# Patient Record
Sex: Female | Born: 1970 | Race: White | Hispanic: No | Marital: Single | Smoking: Never smoker
Health system: Southern US, Community
[De-identification: ages and names within clinical notes are randomized; demographics above are authoritative.]

## PROBLEM LIST (undated history)

## (undated) DIAGNOSIS — D649 Anemia, unspecified: Secondary | ICD-10-CM

## (undated) DIAGNOSIS — C50919 Malignant neoplasm of unspecified site of unspecified female breast: Secondary | ICD-10-CM

## (undated) DIAGNOSIS — R51 Headache: Secondary | ICD-10-CM

## (undated) DIAGNOSIS — B009 Herpesviral infection, unspecified: Secondary | ICD-10-CM

## (undated) DIAGNOSIS — F419 Anxiety disorder, unspecified: Secondary | ICD-10-CM

## (undated) DIAGNOSIS — T7840XA Allergy, unspecified, initial encounter: Secondary | ICD-10-CM

## (undated) DIAGNOSIS — I499 Cardiac arrhythmia, unspecified: Secondary | ICD-10-CM

## (undated) DIAGNOSIS — F909 Attention-deficit hyperactivity disorder, unspecified type: Secondary | ICD-10-CM

## (undated) DIAGNOSIS — I10 Essential (primary) hypertension: Secondary | ICD-10-CM

## (undated) DIAGNOSIS — R519 Headache, unspecified: Secondary | ICD-10-CM

## (undated) DIAGNOSIS — Z923 Personal history of irradiation: Secondary | ICD-10-CM

## (undated) DIAGNOSIS — E039 Hypothyroidism, unspecified: Secondary | ICD-10-CM

## (undated) HISTORY — DX: Allergy, unspecified, initial encounter: T78.40XA

## (undated) HISTORY — PX: KNEE ARTHROSCOPY: SHX127

## (undated) HISTORY — DX: Malignant neoplasm of unspecified site of unspecified female breast: C50.919

## (undated) HISTORY — DX: Hypothyroidism, unspecified: E03.9

## (undated) HISTORY — DX: Anemia, unspecified: D64.9

## (undated) HISTORY — DX: Headache, unspecified: R51.9

## (undated) HISTORY — DX: Headache: R51

## (undated) HISTORY — DX: Attention-deficit hyperactivity disorder, unspecified type: F90.9

---

## 2010-09-07 HISTORY — PX: TONSILLECTOMY: SUR1361

## 2011-09-08 HISTORY — PX: OTHER SURGICAL HISTORY: SHX169

## 2011-09-08 HISTORY — PX: APPENDECTOMY: SHX54

## 2013-07-10 ENCOUNTER — Emergency Department: Payer: Self-pay | Admitting: Emergency Medicine

## 2013-09-12 ENCOUNTER — Emergency Department: Payer: Self-pay | Admitting: Emergency Medicine

## 2013-09-12 LAB — RAPID INFLUENZA A&B ANTIGENS (ARMC ONLY)

## 2013-09-21 ENCOUNTER — Ambulatory Visit (INDEPENDENT_AMBULATORY_CARE_PROVIDER_SITE_OTHER): Payer: BC Managed Care – PPO | Admitting: Internal Medicine

## 2013-09-21 ENCOUNTER — Encounter: Payer: Self-pay | Admitting: Internal Medicine

## 2013-09-21 VITALS — BP 116/78 | HR 87 | Temp 98.2°F | Ht 64.75 in | Wt 184.8 lb

## 2013-09-21 DIAGNOSIS — G8929 Other chronic pain: Secondary | ICD-10-CM | POA: Insufficient documentation

## 2013-09-21 DIAGNOSIS — Z566 Other physical and mental strain related to work: Secondary | ICD-10-CM

## 2013-09-21 DIAGNOSIS — R51 Headache: Secondary | ICD-10-CM

## 2013-09-21 DIAGNOSIS — E039 Hypothyroidism, unspecified: Secondary | ICD-10-CM | POA: Insufficient documentation

## 2013-09-21 DIAGNOSIS — Z569 Unspecified problems related to employment: Secondary | ICD-10-CM

## 2013-09-21 DIAGNOSIS — R519 Headache, unspecified: Secondary | ICD-10-CM

## 2013-09-21 DIAGNOSIS — Z1239 Encounter for other screening for malignant neoplasm of breast: Secondary | ICD-10-CM

## 2013-09-21 DIAGNOSIS — E669 Obesity, unspecified: Secondary | ICD-10-CM

## 2013-09-21 MED ORDER — ALPRAZOLAM 0.25 MG PO TABS: 0.2500 mg | ORAL_TABLET | Freq: Two times a day (BID) | ORAL | Status: DC | PRN

## 2013-09-21 MED ORDER — NORTRIPTYLINE HCL 10 MG PO CAPS: 10.0000 mg | ORAL_CAPSULE | Freq: Every day | ORAL | Status: DC

## 2013-09-21 MED ORDER — VERAPAMIL HCL 120 MG PO TABS: 120.0000 mg | ORAL_TABLET | Freq: Every day | ORAL | Status: DC

## 2013-09-21 NOTE — Assessment & Plan Note (Signed)
She seems to be overmedicated to me Will refer to neurology for further evaluation and treatment

## 2013-09-21 NOTE — Assessment & Plan Note (Signed)
Will give prn xanax

## 2013-09-21 NOTE — Assessment & Plan Note (Signed)
Pt reports this is stress related She plans to go back up to [place] to have her lap band adjusted I advised her to work on diet and exercise

## 2013-09-21 NOTE — Patient Instructions (Signed)

## 2013-09-21 NOTE — Assessment & Plan Note (Signed)
Will heck TSH level today Will adjust dose of synthroid based on level

## 2013-09-21 NOTE — Progress Notes (Signed)
HPI  Pt presents to the clinic today to establish care. She is transferring care from her PCP in [place]. She recently moved here. She is trying to adjust here. She has had worsening anxiety since she moved, occasional panic attacks. She would like something for this. She does have some concerns today about her headaches. They have gotten worse since she moved here. She has had 2 ER visits for the same. She was seeing a neurologist in [place] for nerve blocks which did help short term. She will need referral to a new neurologist. She also reports that she is due for her mammogram and needs to have a new order placed so she can get it done at [facility].  Past Medical History  Diagnosis Date  . Frequent headaches   . Allergy   . Hypothyroidism     Current Outpatient Prescriptions  Medication Sig Dispense Refill  . levonorgestrel-ethinyl estradiol (SEASONALE,INTROVALE,JOLESSA) 0.15-0.03 MG tablet Take 1 tablet by mouth daily.      [name] levothyroxine (SYNTHROID, LEVOTHROID) 137 MCG tablet Take 137 mcg by mouth daily before breakfast.      . metoprolol (LOPRESSOR) 50 MG tablet Take 50 mg by mouth at bedtime.      . nortriptyline (PAMELOR) 10 MG capsule Take 10 mg by mouth at bedtime.      . Prenatal Vit-Fe Fumarate-FA (PRENATAL MULTIVITAMIN) TABS tablet Take 1 tablet by mouth daily at 12 noon.      . topiramate (TOPAMAX) 50 MG tablet Take 50 mg by mouth 2 (two) times daily.      . verapamil (CALAN) 120 MG tablet Take 120 mg by mouth at bedtime.       No current facility-administered medications for this visit.    Not on File  Family History  Problem Relation Age of Onset  . Cancer Mother   . Depression Mother   . Cancer Father   . Hyperlipidemia Father     History   Social History  . Marital Status: Single    Spouse Name: N/A    Number of Children: N/A  . Years of Education: N/A   Occupational History  . Not on file.   Social History Main Topics  . Smoking status: Never Smoker   .  Smokeless tobacco: Not on file  . Alcohol Use: Yes     Comment: occasional  . Drug Use: No  . Sexual Activity: Not on file   Other Topics Concern  . Not on file   Social History Narrative  . No narrative on file    ROS:  Constitutional: Denies fever, malaise, fatigue,  or abrupt weight changes.  HEENT: Denies eye pain, eye redness, ear pain, ringing in the ears, wax buildup, runny nose, nasal congestion, bloody nose, or sore throat. Neurological: Denies dizziness, difficulty with memory, difficulty with speech or problems with balance and coordination.  Psych: Pt reports anxiety. Denies depression, SI/HI.  No other specific complaints in a complete review of systems (except as listed in HPI above).  PE:  BP 116/78  Pulse 87  Temp(Src) 98.2 F (36.8 C) (Oral)  Ht 5' 4.75" (1.645 m)  Wt 184 lb 12 oz (83.802 kg)  BMI 30.97 kg/m2  SpO2 98% Wt Readings from Last 3 Encounters:  09/21/13 184 lb 12 oz (83.802 kg)    General: Appears her stated age, overweight well developed, well nourished in NAD. Cardiovascular: Normal rate and rhythm. S1,S2 noted.  No murmur, rubs or gallops noted. No JVD or BLE edema.  No carotid bruits noted. Pulmonary/Chest: Normal effort and positive vesicular breath sounds. No respiratory distress. No wheezes, rales or ronchi noted.  Neurological: Alert and oriented. Cranial nerves II-XII intact. Coordination normal. +DTRs bilaterally. Psychiatric: Mood anxious and affect normal. Behavior is normal. Judgment and thought content normal.     Assessment and Plan:  Screening for breast cancer:  Referral placed today Will have you set it up with Rosaria Ferries on your way out  Please make an appt for your physical at your earliest convenience

## 2013-09-22 ENCOUNTER — Telehealth: Payer: Self-pay

## 2013-09-22 ENCOUNTER — Other Ambulatory Visit: Payer: Self-pay | Admitting: Internal Medicine

## 2013-09-22 DIAGNOSIS — Z566 Other physical and mental strain related to work: Secondary | ICD-10-CM

## 2013-09-22 LAB — TSH: TSH: 0.94 u[IU]/mL (ref 0.35–5.50)

## 2013-09-22 MED ORDER — LEVOTHYROXINE SODIUM 137 MCG PO TABS: 137.0000 ug | ORAL_TABLET | Freq: Every day | ORAL | Status: DC

## 2013-09-22 MED ORDER — ALPRAZOLAM 0.25 MG PO TABS: 0.2500 mg | ORAL_TABLET | Freq: Two times a day (BID) | ORAL | Status: DC | PRN

## 2013-09-22 NOTE — Telephone Encounter (Signed)
Pt left v/m alprazolam was not at [facility] when picked up other meds. Spoke with [facility] and xanax rx was not received; Medication phoned to [facility] as instructed. Pt notified refill done and apologized to pt.pt voiced understanding.

## 2013-10-18 ENCOUNTER — Ambulatory Visit: Payer: BC Managed Care – PPO | Admitting: Neurology

## 2013-10-24 ENCOUNTER — Ambulatory Visit: Payer: BC Managed Care – PPO | Admitting: Neurology

## 2013-10-25 ENCOUNTER — Telehealth: Payer: Self-pay | Admitting: Neurology

## 2013-10-25 NOTE — Telephone Encounter (Signed)
LM for pt to call. Re: appt 10/24/13 was cancelled d/t inclement weather. I have asked pt to call and r/s appt. Currently awaiting return call / Sherri S.

## 2013-11-07 ENCOUNTER — Other Ambulatory Visit: Payer: Self-pay | Admitting: Internal Medicine

## 2013-11-07 NOTE — Telephone Encounter (Signed)
Last filled 09/22/13--please advise

## 2013-11-07 NOTE — Telephone Encounter (Signed)
Ok to phone in xanax 

## 2013-11-08 ENCOUNTER — Ambulatory Visit (INDEPENDENT_AMBULATORY_CARE_PROVIDER_SITE_OTHER): Payer: BC Managed Care – PPO | Admitting: Neurology

## 2013-11-08 ENCOUNTER — Encounter: Payer: Self-pay | Admitting: Neurology

## 2013-11-08 VITALS — BP 112/78 | HR 70 | Temp 98.2°F | Resp 16 | Ht 65.0 in | Wt 186.6 lb

## 2013-11-08 DIAGNOSIS — G43709 Chronic migraine without aura, not intractable, without status migrainosus: Secondary | ICD-10-CM

## 2013-11-08 DIAGNOSIS — G4440 Drug-induced headache, not elsewhere classified, not intractable: Secondary | ICD-10-CM

## 2013-11-08 MED ORDER — NORTRIPTYLINE HCL 25 MG PO CAPS: 25.0000 mg | ORAL_CAPSULE | Freq: Every day | ORAL | Status: DC

## 2013-11-08 MED ORDER — SUMATRIPTAN SUCCINATE 100 MG PO TABS
100.0000 mg | ORAL_TABLET | Freq: Once | ORAL | Status: DC | PRN
Start: 2013-11-08 — End: 2015-08-05

## 2013-11-08 NOTE — Telephone Encounter (Signed)
Rx called in to pharmacy. 

## 2013-11-08 NOTE — Telephone Encounter (Addendum)
Pt left v/m checking on status of alprazolam refill and wanted to know when someone was going to call pt about scheduling CPX; spoke with [facility] and rx ready for pick up. Left v/m notifying pt rx ready for pick up and pt could call our office to schedule CPX.

## 2013-11-08 NOTE — Progress Notes (Signed)
NEUROLOGY CONSULTATION NOTE  Alexxis Mackert MRN: [id number] DOB: Jun 21, 1971  Referring provider: Webb Silversmith, FNP Primary care provider: Webb Silversmith, FNP  Reason for consult:  headache  HISTORY OF PRESENT ILLNESS: [name] is a 43 year old right-handed woman with history of hypothyroidism, obesity and anxiety who presents for chronic headache.  Records and images were personally reviewed where available.    Onset:  7-8 years ago, but worse over past 3 years and since recently moving here from [place] in September.  She has been experiencing increased anxiety and panic attacks. Location:  Frontal or posterior, bilateral or unilateral Quality:  Pressure, non-throbbing Intensity:   3-4/10 (mild attacks), 8-9/10 (severe attacks) Aura:  no Associated symptoms:  Osmophobia.  Recently, started having blurred vision and photophobia.  No nausea Duration:  2 hours but sometimes up to 1.5 days Frequency:  Mild attacks occur 3x/week, severe attacks occur 4x/month.  (20 headache days a month for 3 years) Triggers/exacerbating factors:  stress, anxiety, change in barometric pressure Relieving factors:   Improved for a little while several years ago after losing weight following lapband surgery  Past abortive therapy:  Excedrin (helped for mild attacks), NSAIDs (ineffective), Imitrex (effective), Relpax (effective) Past preventative therapy:  nerve injections (cannot elaborate on this but they helped for 1-2 weeks at a time) Past medications:  Amitriptyline (nightmares)  Current abortive therapy:  Fiorinal (takes 3x/week), Tylenol (2-3x/week) Current preventative therapy:  nortriptyline 10mg  at bedtime, topiramate 50mg  twice daily (side effects at higher doses), verapamil 120mg  at bedtime, metoprolol 50mg  at bedtime.  Caffeine:  One cup coffee  Stress/depression:  History of anxiety.  Increased stress and anxiety with panic attacks since moving here from [place]. Sleep  hygiene:  varies Family history of headache:  no.  PAST MEDICAL HISTORY: Past Medical History  Diagnosis Date  . Frequent headaches   . Allergy   . Hypothyroidism     PAST SURGICAL HISTORY: Past Surgical History  Procedure Laterality Date  . Appendectomy  2013  . Tonsillectomy  2012  . Lapband  2013    MEDICATIONS: Current Outpatient Prescriptions on File Prior to Visit  Medication Sig Dispense Refill  . ALPRAZolam (XANAX) 0.25 MG tablet TAKE 1 TABLET BY MOUTH TWICE A DAY AS NEEDED ANXIETY  20 tablet  0  . levonorgestrel-ethinyl estradiol (SEASONALE,INTROVALE,JOLESSA) 0.15-0.03 MG tablet Take 1 tablet by mouth daily.      [name] levothyroxine (SYNTHROID, LEVOTHROID) 137 MCG tablet Take 1 tablet (137 mcg total) by mouth daily before breakfast.  30 tablet  2  . metoprolol (LOPRESSOR) 50 MG tablet Take 50 mg by mouth at bedtime.      . Prenatal Vit-Fe Fumarate-FA (PRENATAL MULTIVITAMIN) TABS tablet Take 1 tablet by mouth daily at 12 noon.      . topiramate (TOPAMAX) 50 MG tablet Take 50 mg by mouth 2 (two) times daily.      . verapamil (CALAN) 120 MG tablet Take 1 tablet (120 mg total) by mouth at bedtime.  30 tablet  2   No current facility-administered medications on file prior to visit.    ALLERGIES: Allergies  Allergen Reactions  . Ciprofloxacin   . Keflex [Cephalexin] Hives    FAMILY HISTORY: Family History  Problem Relation Age of Onset  . Cancer Mother   . Depression Mother   . Cancer Father   . Hyperlipidemia Father     SOCIAL HISTORY: History   Social History  . Marital Status: Single    Spouse Name: N/A  Number of Children: N/A  . Years of Education: N/A   Occupational History  . Not on file.   Social History Main Topics  . Smoking status: Never Smoker   . Smokeless tobacco: Not on file  . Alcohol Use: Yes     Comment: occasional  . Drug Use: No  . Sexual Activity: Yes    Partners: Male   Other Topics Concern  . Not on file   Social History  Narrative  . No narrative on file    REVIEW OF SYSTEMS: Constitutional: No fevers, chills, or sweats, no generalized fatigue, change in appetite Eyes: No visual changes, double vision, eye pain Ear, nose and throat: No hearing loss, ear pain, nasal congestion, sore throat Cardiovascular: No chest pain, palpitations Respiratory:  No shortness of breath at rest or with exertion, wheezes GastrointestinaI: No nausea, vomiting, diarrhea, abdominal pain, fecal incontinence Genitourinary:  No dysuria, urinary retention or frequency Musculoskeletal:  No neck pain, back pain Integumentary: No rash, pruritus, skin lesions Neurological: as above Psychiatric: No depression, insomnia, anxiety Endocrine: No palpitations, fatigue, diaphoresis, mood swings, change in appetite, change in weight, increased thirst Hematologic/Lymphatic:  No anemia, purpura, petechiae. Allergic/Immunologic: no itchy/runny eyes, nasal congestion, recent allergic reactions, rashes  PHYSICAL EXAM: Filed Vitals:   11/08/13 1424  BP: 112/78  Pulse: 70  Temp: 98.2 F (36.8 C)  Resp: 16   General: No acute distress Head:  Normocephalic/atraumatic Neck: supple, no paraspinal tenderness, full range of motion Back: No paraspinal tenderness Heart: regular rate and rhythm Lungs: Clear to auscultation bilaterally. Vascular: No carotid bruits. Neurological Exam: Mental status: alert and oriented to person, place, and time, speech fluent and not dysarthric, language intact. Cranial nerves: CN I: not tested CN II: pupils equal, round and reactive to light, visual fields intact, fundi unremarkable. CN III, IV, VI:  full range of motion, no nystagmus, no ptosis CN V: facial sensation intact CN VII: upper and lower face symmetric CN VIII: hearing intact CN IX, X: gag intact, uvula midline CN XI: sternocleidomastoid and trapezius muscles intact CN XII: tongue midline Bulk & Tone: normal, no fasciculations. Motor: 5/5  throughout Sensation: temperature and vibration intact Deep Tendon Reflexes: 2+ throughout, toes down Finger to nose testing: no dysmetria Heel to shin: no dysmetria Gait: normal stance and stride.  Able to turn and walk in tandem. Romberg negative.  IMPRESSION: Chronic migraine without aura, complicated by medication-overuse  PLAN: 1.  Increase nortriptyline to 25mg  at bedtime 2.  Sumatriptan 100mg  for abortive therapy. 3.  Limit pain relievers to no more than 2 days out of the week. 4.  Stop verapamil and Fiorinal  5.  Continue metoprolol and topamax for now. 6.  Pre-authorization for botox.  She is a viable candidate.  She has migraines and suffers from over 15 days of headache per month for 3 years.  She has failed several preventative medications and currently is taking 4 preventative medications. 7.  Obtain notes from [place]. 8.  Follow up for Botox  Thank you for allowing me to take part in the care of this patient.  Metta Clines, DO  CC:  Webb Silversmith, FNP

## 2013-11-08 NOTE — Patient Instructions (Signed)
Migraine Recommendations: 1.  Take sumatriptan (Imitrex) at immediate onset of migraine.  May repeat once in 2 hours if headache persists or recurs.  Do not exceed two tablets in a 24 hour period. 2.  Limit use of pain relievers to no more than 2 days out of the week.  These medications include acetaminophen, ibuprofen, triptans and narcotics.  This will help reduce risk of rebound headaches. 3.  Keep a headache diary. 4.  Stay adequately hydrated. 5.  Maintain good sleep hygiene. 6.  Maintain proper stress management. 7.  Stop the Fiorinal and Verapamil. 8.  Increase nortriptyline to 25mg  at bedtime.  In the meantime, take 2 of your 10mg  pills until you are ready for a refill.  Order is placed at your pharmacy. 9.  Continue the topamax and metoprolol for now. 10.  Will get pre-authorization for Botox. 11.  Follow up one month after first round of botox.  Call in 4 weeks with update or sooner if needed.

## 2013-11-17 ENCOUNTER — Telehealth: Payer: Self-pay | Admitting: Neurology

## 2013-11-17 NOTE — Telephone Encounter (Signed)
Still having headache with the imitrex please call at [phone]

## 2013-11-20 NOTE — Telephone Encounter (Signed)
Please advise patient is still having headache on the imitrex

## 2013-11-21 NOTE — Telephone Encounter (Signed)
RX for Relpax 20 mg  # 10 and 1 refill called in as well as prednisone taper with direction as  indicated per Dr Tomi Likens

## 2013-11-21 NOTE — Telephone Encounter (Signed)
She can try the Relpax again.  20mg  for headache.  May repeat x1 after 2 hours.  #10, R1.    However, as we discussed at her visit, she should expect that the headaches may get worse since I don't want her taking Fiorinal and I want her to limit pain relievers to no more than 2 days out of the week.  She has chronic daily headaches, so most acute pain relievers may not be effective at this point, anyway.  The real focus is to break and reduce frequency of the headaches, which is why we are trying to get approval for Botox.    In the meantime, we can prescribe her a prednisone 10mg  tablet taper (6tabs x1d, then 5tabs x1d, then 4tabs x1d, then 3tabs x1d, then 2tabs x1d, then 1tab x1d, then stop) #21, R0.  She shouldn't take any NSAIDs while on this taper because of GI upset.

## 2013-11-21 NOTE — Telephone Encounter (Signed)
Pt has called a 2nd time, still having headaches. Please call ASAP / Gayleen Orem,

## 2013-11-22 ENCOUNTER — Telehealth: Payer: Self-pay | Admitting: Neurology

## 2013-11-22 NOTE — Telephone Encounter (Signed)
Question about Botox prior approval. Please call Kiel w/ [employer] [phone] / Gayleen Orem.

## 2013-11-22 NOTE — Telephone Encounter (Signed)
Correct # [phone] [id number]

## 2013-11-27 ENCOUNTER — Telehealth: Payer: Self-pay | Admitting: Neurology

## 2013-11-27 ENCOUNTER — Encounter: Payer: Self-pay | Admitting: Family Medicine

## 2013-11-27 ENCOUNTER — Ambulatory Visit (INDEPENDENT_AMBULATORY_CARE_PROVIDER_SITE_OTHER): Payer: BC Managed Care – PPO | Admitting: Family Medicine

## 2013-11-27 VITALS — BP 142/82 | HR 81 | Temp 97.9°F | Wt 190.5 lb

## 2013-11-27 DIAGNOSIS — R21 Rash and other nonspecific skin eruption: Secondary | ICD-10-CM | POA: Insufficient documentation

## 2013-11-27 MED ORDER — MUPIROCIN CALCIUM 2 % EX CREA: 1.0000 "application " | TOPICAL_CREAM | Freq: Three times a day (TID) | CUTANEOUS | Status: DC

## 2013-11-27 MED ORDER — VALACYCLOVIR HCL 1 G PO TABS: 1000.0000 mg | ORAL_TABLET | Freq: Three times a day (TID) | ORAL | Status: DC

## 2013-11-27 NOTE — Telephone Encounter (Signed)
I called [name] and told her that her insurance carrier denied Botox.  The reason being that she has medication overuse headache as well (Fioricet 3x/week and Tylenol 2-3x/week).  Unfortunately the prednisone taper was minimally effective.  The only way to treat it at this time would be to refrain from using pain relievers to no more than 2 days out of the week.  This is the only way to treat medication-overuse headache.  I told her that the headaches will likely get worse before they get better.  If the headaches persist after 3 months of minimal pain-reliever use, then we can try re-submitting pre-authorization for Botox.  I answered all questions to the best of my ability.

## 2013-11-27 NOTE — Progress Notes (Signed)
Pre visit review using our clinic review tool, if applicable. No additional management support is needed unless otherwise documented below in the visit note. 

## 2013-11-27 NOTE — Progress Notes (Signed)
   BP 142/82  Pulse 81  Temp(Src) 97.9 F (36.6 C) (Oral)  Wt 190 lb 8 oz (86.41 kg)  SpO2 96%  LMP 08/15/2013   CC: check spot on nose  Subjective:    Patient ID: [name], female    DOB: [birth year], 43 y.o.   MRN: [id number]  HPI: [name] is a 43 y.o. female presenting on 11/27/2013 for Rash   Woke up this morning with red lesion on nose - between nares.  No crusting or discharge.  Increased stress over last few weeks.  + itchy and uncomfortable throbbing pain no burning. Some malaise as well. Has tried lavender oil which helped some.  H/o cold sores but never on the nose.  No fevers/chills, other rash, oral lesions.  Relevant past medical, surgical, family and social history reviewed and updated as indicated.  Allergies and medications reviewed and updated. Current Outpatient Prescriptions on File Prior to Visit  Medication Sig  . ALPRAZolam (XANAX) 0.25 MG tablet TAKE 1 TABLET BY MOUTH TWICE A DAY AS NEEDED ANXIETY  . levonorgestrel-ethinyl estradiol (SEASONALE,INTROVALE,JOLESSA) 0.15-0.03 MG tablet Take 1 tablet by mouth daily.  [name] levothyroxine (SYNTHROID, LEVOTHROID) 137 MCG tablet Take 1 tablet (137 mcg total) by mouth daily before breakfast.  . metoprolol (LOPRESSOR) 50 MG tablet Take 50 mg by mouth at bedtime.  . nortriptyline (PAMELOR) 25 MG capsule Take 1 capsule (25 mg total) by mouth at bedtime.  . Prenatal Vit-Fe Fumarate-FA (PRENATAL MULTIVITAMIN) TABS tablet Take 1 tablet by mouth daily at 12 noon.  . SUMAtriptan (IMITREX) 100 MG tablet Take 1 tablet (100 mg total) by mouth once as needed for migraine or headache. May repeat once in 2 hours if headache persists or recurs.  Do not exceed two tabs in 24h  . topiramate (TOPAMAX) 50 MG tablet Take 50 mg by mouth 2 (two) times daily.   No current facility-administered medications on file prior to visit.    Review of Systems Per HPI unless specifically indicated above    Objective:    BP  142/82  Pulse 81  Temp(Src) 97.9 F (36.6 C) (Oral)  Wt 190 lb 8 oz (86.41 kg)  SpO2 96%  LMP 08/15/2013  Physical Exam  Nursing note and vitals reviewed. Constitutional: She appears well-developed and well-nourished. No distress.  HENT:  Head: Normocephalic and atraumatic.  Nose: No mucosal edema, rhinorrhea or nose lacerations.    5-6 erythematous papules with mild crusting overlying lesions, less likely vesicular       Assessment & Plan:   Problem List Items Addressed This Visit   Skin rash - Primary     On nose - anticipate impetigo - treat with mupirocin cream tid x5 days, discussed warm compresses as well. Less likely shingles - however if spreading unilateral rash, recommended fill valtrex - and provided with WASP. Pt agrees with plan. 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        Follow up plan: Return if symptoms worsen or fail to improve.

## 2013-11-27 NOTE — Patient Instructions (Signed)
I'd like to treat you as impetigo which is a bacterial skin infection. If spreading blisters on left side of face, start valtrex (antiviral for shingles). Let us know if not improving as expected or any worsening.  Impetigo Impetigo is an infection of the skin, most common in babies and children.  CAUSES  It is caused by staphylococcal or streptococcal germs (bacteria). Impetigo can start after any damage to the skin. The damage to the skin may be from things like:   Chickenpox.  Scrapes.  Scratches.  Insect bites (common when children scratch the bite).  Cuts.  Nail biting or chewing. Impetigo is contagious. It can be spread from one person to another. Avoid close skin contact, or sharing towels or clothing. SYMPTOMS  Impetigo usually starts out as small blisters or pustules. Then they turn into tiny yellow-crusted sores (lesions).  There may also be:  Large blisters.  Itching or pain.  Pus.  Swollen lymph glands. With scratching, irritation, or non-treatment, these small areas may get larger. Scratching can cause the germs to get under the fingernails; then scratching another part of the skin can cause the infection to be spread there. DIAGNOSIS  Diagnosis of impetigo is usually made by a physical exam. A skin culture (test to grow bacteria) may be done to prove the diagnosis or to help decide the best treatment.  TREATMENT  Mild impetigo can be treated with prescription antibiotic cream. Oral antibiotic medicine may be used in more severe cases. Medicines for itching may be used. HOME CARE INSTRUCTIONS   To avoid spreading impetigo to other body areas:  Keep fingernails short and clean.  Avoid scratching.  Cover infected areas if necessary to keep from scratching.  Gently wash the infected areas with antibiotic soap and water.  Soak crusted areas in warm soapy water using antibiotic soap.  Gently rub the areas to remove crusts. Do not scrub.  Wash hands often  to avoid spread this infection.  Keep children with impetigo home from school or daycare until they have used an antibiotic cream for 48 hours (2 days) or oral antibiotic medicine for 24 hours (1 day), and their skin shows significant improvement.  Children may attend school or daycare if they only have a few sores and if the sores can be covered by a bandage or clothing. SEEK MEDICAL CARE IF:   More blisters or sores show up despite treatment.  Other family members get sores.  Rash is not improving after 48 hours (2 days) of treatment. SEEK IMMEDIATE MEDICAL CARE IF:   You see spreading redness or swelling of the skin around the sores.  You see red streaks coming from the sores.  Your child develops a fever of 100.4 F (37.2 C) or higher.  Your child develops a sore throat.  Your child is acting ill (lethargic, sick to their stomach). Document Released: 08/21/2000 Document Revised: 11/16/2011 Document Reviewed: 06/20/2008 [facility] Patient Information 2014 [place].

## 2013-11-27 NOTE — Assessment & Plan Note (Addendum)
On nose - anticipate impetigo - treat with mupirocin cream tid x5 days, discussed warm compresses as well. Less likely shingles - however if spreading unilateral rash, recommended fill valtrex - and provided with WASP. Pt agrees with plan. 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

## 2013-12-01 ENCOUNTER — Telehealth: Payer: Self-pay | Admitting: Neurology

## 2013-12-01 NOTE — Telephone Encounter (Signed)
I spoke with patient she has had headache x 1 day starting this am  With vomiting Per Dr Tomi Likens she should take her Imatrix  And she should take no more than for 2 days a week pain meds

## 2013-12-01 NOTE — Telephone Encounter (Signed)
Pt is calling again and needs to talk to someone

## 2013-12-01 NOTE — Telephone Encounter (Signed)
Pt called stating that she is experiencing migraines and vomiting from it.  Please call pt.

## 2013-12-13 ENCOUNTER — Emergency Department: Payer: Self-pay | Admitting: Emergency Medicine

## 2013-12-18 ENCOUNTER — Telehealth: Payer: Self-pay

## 2013-12-18 NOTE — Telephone Encounter (Signed)
Patient Information:  Caller Name: Addalie  Phone: [phone]  Patient: [name]  Gender: Female  DOB: 04/09/1971  Age: 43 Years  PCP: Other  Pregnant: No  Office Follow Up:  Does the office need to follow up with this patient?: No  Instructions For The Office: N/A  RN Note:  Report called to [facility] before advising Urgent Care. Per Rena--Advise physicians want evaluation (more than just a urine specimen) prior to treating symptoms. Advise Urgent Care evaluation. Patient made aware.  Symptoms  Reason For Call & Symptoms: Urinary Frequency and Burning. PMH Frequent UTIs.  Reviewed Health History In EMR: Yes  Reviewed Medications In EMR: Yes  Reviewed Allergies In EMR: Yes  Reviewed Surgeries / Procedures: Yes  Date of Onset of Symptoms: 12/17/2013 OB / GYN:  LMP: 08/15/2013  Guideline(s) Used:  Urination Pain - Female  Disposition Per Guideline:   See Today in Office  Reason For Disposition Reached:   Painful urination AND EITHER frequency or urgency  Advice Given:  Fluids:   Drink extra fluids. Drink 8-10 glasses of liquids a day (Reason: to produce a dilute, non-irritating urine).  Call Back If:  You become worse.  Patient Will Follow Care Advice:  YES

## 2013-12-18 NOTE — Telephone Encounter (Signed)
Megan with [facility] said pt started last night with burning and frequency of urination, no fever. 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 Jinny Blossom said [facility] guidelines is to be seen within 24 hrs of onset symptoms; pt wanted to know if could bring urine specimen and drop off later today; advised Megan for best pt care pt would need to be seen;Megan will advise [facility].

## 2013-12-28 ENCOUNTER — Other Ambulatory Visit: Payer: Self-pay | Admitting: Internal Medicine

## 2013-12-29 NOTE — Telephone Encounter (Signed)
Rx called in to pharmacy. 

## 2013-12-29 NOTE — Telephone Encounter (Signed)
Last OV 09/2013--last refilled 11/2013--please advise

## 2014-01-11 ENCOUNTER — Telehealth: Payer: Self-pay | Admitting: Neurology

## 2014-01-11 NOTE — Telephone Encounter (Signed)
Patient called stating she has had a headache for the past week and half medication is giving no relief please advise ? She also questioned the Botox again .

## 2014-01-11 NOTE — Telephone Encounter (Signed)
Needs to talk to someone about the botox please call [phone]

## 2014-01-12 ENCOUNTER — Encounter: Payer: Self-pay | Admitting: Family Medicine

## 2014-01-12 ENCOUNTER — Ambulatory Visit (INDEPENDENT_AMBULATORY_CARE_PROVIDER_SITE_OTHER): Payer: BC Managed Care – PPO | Admitting: Family Medicine

## 2014-01-12 VITALS — BP 110/76 | HR 114 | Temp 98.2°F | Ht 65.0 in | Wt 186.2 lb

## 2014-01-12 DIAGNOSIS — N898 Other specified noninflammatory disorders of vagina: Secondary | ICD-10-CM

## 2014-01-12 DIAGNOSIS — N949 Unspecified condition associated with female genital organs and menstrual cycle: Secondary | ICD-10-CM

## 2014-01-12 NOTE — Telephone Encounter (Signed)
Prednisone taper was called in to [facility] pharmacy

## 2014-01-12 NOTE — Patient Instructions (Signed)
No bacterial or yeast or parasite infection. Wear cotton underware , avoid irritants, no douche, wash vaginally with warm water +/- mild soap only.

## 2014-01-12 NOTE — Assessment & Plan Note (Signed)
No bacterial or yeast or parasite infection. Wear cotton underware , avoid irritants, no douche, wash vaginally with warm water +/- mild soap only.  

## 2014-01-12 NOTE — Telephone Encounter (Signed)
Let's prescribe a prednisone taper (10mg  tablets).  Take 6tabs x1day, then 5tabs x1day, then 4tabs x1day, then 3tabs x1day, then 2tabs x1day, then 1tab x1day, then STOP.  She should refrain from taking any NSAIDs during the taper.  As far as the Botox, it has been denied.  They have a pretty strict criteria so I will look into it regarding what we can do.  In the meantime, let's schedule a follow up at earliest convenience to discuss further

## 2014-01-12 NOTE — Progress Notes (Signed)
Pre visit review using our clinic review tool, if applicable. No additional management support is needed unless otherwise documented below in the visit note. 

## 2014-01-12 NOTE — Telephone Encounter (Signed)
I spoke with patient regarding medication taper and not to take any  NSAIDS  While taking the NSAIDS she understands . She will also call and make an appt  ASAP for a late afternoon  She states  She had a fall last night  Because the headache was so bad

## 2014-01-12 NOTE — Progress Notes (Signed)
   Subjective:    Patient ID: [name], female    DOB: 07/14/1971, 43 y.o.   MRN: [id number]  Vaginal Discharge The patient's primary symptoms include a genital odor and a vaginal discharge. The patient's pertinent negatives include no genital itching, genital lesions, missed menses, pelvic pain or vaginal bleeding. Primary symptoms comment: greyish in color. This is a new problem. The current episode started in the past 7 days (2-3 days). The problem occurs constantly. The problem has been unchanged. The patient is experiencing no pain. She is not pregnant. Pertinent negatives include no abdominal pain, chills, dysuria or fever. The vaginal discharge was grey, malodorous and scant. She has tried nothing for the symptoms. She is sexually active. She uses oral contraceptives for contraception. Her past medical history is significant for vaginosis. (Last BV several years ago,  no recent antibiotics.)      Review of Systems  Constitutional: Negative for fever and chills.  Gastrointestinal: Negative for abdominal pain.  Genitourinary: Positive for vaginal discharge. Negative for dysuria, pelvic pain and missed menses.       Objective:   Physical Exam  Constitutional: She appears well-developed and well-nourished.  Cardiovascular: Normal rate.   No murmur heard. Pulmonary/Chest: Effort normal and breath sounds normal. She has no wheezes.  Abdominal: Soft. Bowel sounds are normal. There is no tenderness.  Genitourinary: There is no rash or tenderness on the right labia. There is no rash or tenderness on the left labia. No erythema or tenderness around the vagina. Vaginal discharge found.          Assessment & Plan:

## 2014-01-22 ENCOUNTER — Encounter: Payer: Self-pay | Admitting: Neurology

## 2014-01-22 ENCOUNTER — Ambulatory Visit (INDEPENDENT_AMBULATORY_CARE_PROVIDER_SITE_OTHER): Payer: BC Managed Care – PPO | Admitting: Neurology

## 2014-01-22 VITALS — BP 126/82 | HR 78 | Temp 98.0°F | Resp 20 | Ht 65.0 in | Wt 189.8 lb

## 2014-01-22 DIAGNOSIS — G43719 Chronic migraine without aura, intractable, without status migrainosus: Secondary | ICD-10-CM

## 2014-01-22 MED ORDER — KETOROLAC TROMETHAMINE 60 MG/2ML IM SOLN
60.0000 mg | Freq: Once | INTRAMUSCULAR | Status: AC
  Administered 2014-01-22: 60 mg via INTRAMUSCULAR

## 2014-01-22 NOTE — Progress Notes (Addendum)
NEUROLOGY FOLLOW UP OFFICE NOTE  [name] [id number]  HISTORY OF PRESENT ILLNESS: [name] is a 43 year old right-handed woman with history of hypothyroidism, obesity and anxiety who follows up for chronic migraine without aura and medication-overuse.  Records and images were personally reviewed where available.    UPDATE: Increased headache frequency over past month.  Prednisone taper last week not helpful.  Denied by [facility] for Botox due to medication-overuse.  Intensity:  3-4/10 mild, 8-9/10 severe Duration:  2 hours mild, 2 days severe Frequency:  20 headache days per month (6 severe migraine days, 14 mild migraine days) Associated symptoms:  Had two episodes with nausea and vomiting past month which is new.  Current abortive therapy:  Relpax 20mg  (effective but makes sleepy), sumatriptan 100mg  (effective 70% of time), occasionally tylenol 2 days of week (not really effective) Current preventative therapy:  nortriptyline 25mg , topamax 100mg , metoprolol 50mg   Caffeine:  none Alcohol:  rarely Smoker:  no Stress/depression:  Anxiety getting better Sleep hygiene:  better Diet:  Eats well (only has fast food about twice a month) Exercise:  3-4 times a week  HISTORY: Onset:  7-8 years ago, but worse over past 3 years and since recently moving here from [place] in September.  She has been experiencing increased anxiety and panic attacks. Location:  Frontal or posterior, bilateral or unilateral Quality:  Pressure, non-throbbing Intensity:   3-4/10 (mild attacks), 8-9/10 (severe attacks) Aura:  no Associated symptoms:  Osmophobia.  Recently, started having blurred vision and photophobia. Duration:  2 hours but sometimes up to 1.5 days Frequency:  Mild attacks occur 3x/week, severe attacks occur 4x/month.  (20 headache days a month for 3 years) Triggers/exacerbating factors:  stress, anxiety, change in barometric pressure Relieving factors:   Improved  for a little while several years ago after losing weight following lapband surgery  Past abortive therapy:  Excedrin (helped for mild attacks), ibuprofen (ineffective), naproxen (ineffective), Fiorinal Past preventative therapy:  nerve injections (cannot elaborate on this but they helped for 1-2 weeks at a time), amitriptyline (nightmares), verapamil (ineffective)  Family history of migraine:  No  PAST MEDICAL HISTORY: Past Medical History  Diagnosis Date  . Frequent headaches   . Allergy   . Hypothyroidism     MEDICATIONS: Current Outpatient Prescriptions on File Prior to Visit  Medication Sig Dispense Refill  . ALPRAZolam (XANAX) 0.25 MG tablet TAKE 1 TABLET BY MOUTH TWICE A DAY AS NEEDED ANXIETY  20 tablet  0  . eletriptan (RELPAX) 20 MG tablet Take 20 mg by mouth as needed for migraine or headache. One tablet by mouth at onset of headache. May repeat in 2 hours if headache persists or recurs.      [name] levonorgestrel-ethinyl estradiol (SEASONALE,INTROVALE,JOLESSA) 0.15-0.03 MG tablet Take 1 tablet by mouth daily.      [name] levothyroxine (SYNTHROID, LEVOTHROID) 137 MCG tablet Take 1 tablet (137 mcg total) by mouth daily before breakfast.  30 tablet  2  . metoprolol (LOPRESSOR) 50 MG tablet Take 50 mg by mouth at bedtime.      . nortriptyline (PAMELOR) 25 MG capsule Take 1 capsule (25 mg total) by mouth at bedtime.  30 capsule  3  . Prenatal Vit-Fe Fumarate-FA (PRENATAL MULTIVITAMIN) TABS tablet Take 1 tablet by mouth daily at 12 noon.      . SUMAtriptan (IMITREX) 100 MG tablet Take 1 tablet (100 mg total) by mouth once as needed for migraine or headache. May repeat once in 2 hours  if headache persists or recurs.  Do not exceed two tabs in 24h  9 tablet  3  . topiramate (TOPAMAX) 50 MG tablet Take 50 mg by mouth 2 (two) times daily.       No current facility-administered medications on file prior to visit.    ALLERGIES: Allergies  Allergen Reactions  . Ciprofloxacin   . Keflex  [Cephalexin] Hives    FAMILY HISTORY: Family History  Problem Relation Age of Onset  . Cancer Mother   . Depression Mother   . Cancer Father   . Hyperlipidemia Father     SOCIAL HISTORY: History   Social History  . Marital Status: Single    Spouse Name: N/A    Number of Children: N/A  . Years of Education: N/A   Occupational History  . Not on file.   Social History Main Topics  . Smoking status: Never Smoker   . Smokeless tobacco: Never Used  . Alcohol Use: Yes     Comment: occasional  . Drug Use: No  . Sexual Activity: Yes    Partners: Male   Other Topics Concern  . Not on file   Social History Narrative  . No narrative on file    REVIEW OF SYSTEMS: Constitutional: No fevers, chills, or sweats, no generalized fatigue, change in appetite Eyes: No visual changes, double vision, eye pain Ear, nose and throat: No hearing loss, ear pain, nasal congestion, sore throat Cardiovascular: No chest pain, palpitations Respiratory:  No shortness of breath at rest or with exertion, wheezes GastrointestinaI: No nausea, vomiting, diarrhea, abdominal pain, fecal incontinence Genitourinary:  No dysuria, urinary retention or frequency Musculoskeletal:  No neck pain, back pain Integumentary: No rash, pruritus, skin lesions Neurological: as above Psychiatric: No depression, insomnia, anxiety Endocrine: No palpitations, fatigue, diaphoresis, mood swings, change in appetite, change in weight, increased thirst Hematologic/Lymphatic:  No anemia, purpura, petechiae. Allergic/Immunologic: no itchy/runny eyes, nasal congestion, recent allergic reactions, rashes  PHYSICAL EXAM: Filed Vitals:   01/22/14 0927  BP: 126/82  Pulse: 78  Temp: 98 F (36.7 C)  Resp: 20   General: No acute distress Head:  Normocephalic/atraumatic Neck: supple, no paraspinal tenderness, full range of motion Heart:  Regular rate and rhythm Lungs:  Clear to auscultation bilaterally Back: No paraspinal  tenderness Neurological Exam: alert and oriented to person, place, and time. Attention span and concentration intact, recent and remote memory intact, fund of knowledge intact.  Speech fluent and not dysarthric, language intact.  CN II-XII intact. Fundoscopic exam unremarkable without vessel changes, exudates, hemorrhages or papilledema.  Bulk and tone normal, muscle strength 5/5 throughout.  Sensation to light touch intact.  Deep tendon reflexes 2+ throughout, toes downgoing.  Finger to nose intact.  Gait normal, Romberg negative.  IMPRESSION: Chronic migraine without aura, intractable.  Likely has been worse due to change in weather.  PLAN: 1.  Increase nortriptyline to 50mg  at bedtime.  Will finish the 25mg  tablets and then will call for 50mg  tablets. 2.  Will taper off metoprolol to 25mg  daily for one week, then stop.   3.  Continue topamax for now but will likely taper off too. 4.  For abortive therapy, take Sumatriptan 100mg  (or Relpax) with naproxen 500mg  5.  Will give Toradol 60mg  injection in clinic today. 6.  Call in 4 weeks with update and follow up in 3 months.  Metta Clines, DO  CC:  Webb Silversmith, NP

## 2014-01-22 NOTE — Addendum Note (Signed)
Addended by: Charyl Bigger E on: 01/22/2014 10:11 AM   Modules accepted: Orders

## 2014-01-22 NOTE — Patient Instructions (Signed)
Migraine Recommendations: 1.  When you get another headache, take the Sumatriptan 100mg  with naproxen 500mg  at immediate onset of migraine.  May repeat in 2 hours if needed. 2.  Limit use of pain relievers to no more than 2 days out of the week.  These medications include acetaminophen, ibuprofen, triptans and narcotics.  This will help reduce risk of rebound headaches. 3.  Keep a headache diary. 4.  Stay adequately hydrated. 5.  Maintain good sleep hygiene. 6.  Maintain proper stress management. 7.  Increase nortriptyline to 50mg  at bedtime.  Take two 25mg  tablets now and when you run out, call us and we can prescribe 50mg  tablet to take at bedtime. 8.  Reduce Lopressor to 25mg  daily for one week, then stop.  Continue topamax 100mg  for now, but I would like to eventually taper you off that as well. 9.  CALL IN 4 WEEKS WITH UPDATE.  We can adjust dose or medication if needed.  Follow up in 3 months. 10.  We will give you Toradol 60mg  injection today.

## 2014-02-14 ENCOUNTER — Other Ambulatory Visit: Payer: Self-pay | Admitting: Internal Medicine

## 2014-02-14 NOTE — Telephone Encounter (Signed)
Last OV 09/2013--last refilled 12/29/13--please advise

## 2014-02-14 NOTE — Telephone Encounter (Signed)
Ok to phone in xanax 

## 2014-02-14 NOTE — Telephone Encounter (Signed)
Rx called in to pharmacy. 

## 2014-03-18 ENCOUNTER — Emergency Department: Payer: Self-pay | Admitting: Emergency Medicine

## 2014-04-23 ENCOUNTER — Other Ambulatory Visit: Payer: Self-pay | Admitting: Internal Medicine

## 2014-04-23 DIAGNOSIS — E039 Hypothyroidism, unspecified: Secondary | ICD-10-CM

## 2014-04-24 NOTE — Telephone Encounter (Signed)
i saw where this pt had labs and was normal--does this pt still need to be on medication as it has been a big break in the last time it was filled and now--please advise

## 2014-04-24 NOTE — Telephone Encounter (Signed)
Will refill. She should come in 1 month to recheck TSH if she has been out of her medication. If she comes to lab, please order future TSH and Free T4

## 2014-04-27 NOTE — Telephone Encounter (Signed)
Left detailed msg on VM per HIPAA letting pt know to call to make a f/u lab only appt

## 2014-04-27 NOTE — Addendum Note (Signed)
Addended by: Lurlean Nanny on: 04/27/2014 10:55 AM   Modules accepted: Orders

## 2014-05-08 ENCOUNTER — Other Ambulatory Visit: Payer: Self-pay | Admitting: *Deleted

## 2014-05-08 MED ORDER — ALPRAZOLAM 0.25 MG PO TABS: ORAL_TABLET | ORAL | Status: DC

## 2014-05-08 NOTE — Telephone Encounter (Signed)
#  20 x0 last filled 02/14/14, last ov with you was to est in 1/15. Ok to fill?

## 2014-05-08 NOTE — Telephone Encounter (Signed)
rx called into pharmacy

## 2014-05-08 NOTE — Telephone Encounter (Signed)
Ok to phone in xanax 

## 2014-05-17 ENCOUNTER — Ambulatory Visit (INDEPENDENT_AMBULATORY_CARE_PROVIDER_SITE_OTHER): Payer: BC Managed Care – PPO | Admitting: Internal Medicine

## 2014-05-17 ENCOUNTER — Encounter: Payer: Self-pay | Admitting: Internal Medicine

## 2014-05-17 VITALS — BP 106/80 | HR 96 | Temp 98.2°F | Wt 194.0 lb

## 2014-05-17 DIAGNOSIS — R490 Dysphonia: Secondary | ICD-10-CM

## 2014-05-17 DIAGNOSIS — R5381 Other malaise: Secondary | ICD-10-CM

## 2014-05-17 DIAGNOSIS — R5383 Other fatigue: Secondary | ICD-10-CM

## 2014-05-17 NOTE — Progress Notes (Signed)
Subjective:    Patient ID: [name], female    DOB: [birth year], 43 y.o.   MRN: [id number]  HPI  Presents presents with fatigue and laryngitis for the past month.  She has taken  Benydryl and   OTC allergy medication with little relief.  She feels like she wants to sleep all the time and is still exhausted. She denies throat pain, fever, chills or any upper respiratory symptoms. She was changed to a generic thyroid medication recently.  She reports that she had unusual spotting between her last periods, she took a home pregnancy test, which was negative. 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          Review of Systems  Past Medical History  Diagnosis Date  . Frequent headaches   . Allergy   . Hypothyroidism     Current Outpatient Prescriptions  Medication Sig Dispense Refill  . ALPRAZolam (XANAX) 0.25 MG tablet TAKE 1 TABLET BY MOUTH 2 TIMES A DAY AS NEEDED FOR ANXIETY  20 tablet  0  . eletriptan (RELPAX) 20 MG tablet Take 20 mg by mouth as needed for migraine or headache. One tablet by mouth at onset of headache. May repeat in 2 hours if headache persists or recurs.      [name] levonorgestrel-ethinyl estradiol (SEASONALE,INTROVALE,JOLESSA) 0.15-0.03 MG tablet Take 1 tablet by mouth daily.      [name] levothyroxine (SYNTHROID, LEVOTHROID) 137 MCG tablet TAKE 1 TABLET (137 MCG TOTAL) BY MOUTH DAILY BEFORE BREAKFAST.  30 tablet  2  . metoprolol (LOPRESSOR) 50 MG tablet Take 50 mg by mouth at bedtime.      . nortriptyline (PAMELOR) 25 MG capsule Take 1 capsule (25 mg total) by mouth at bedtime.  30 capsule  3  . Prenatal Vit-Fe Fumarate-FA (PRENATAL MULTIVITAMIN) TABS tablet Take 1 tablet by mouth daily at 12 noon.      . SUMAtriptan (IMITREX) 100 MG tablet Take 1 tablet (100 mg total) by mouth once as needed for migraine or headache. May repeat once in 2 hours if headache persists or recurs.  Do not exceed two tabs in 24h  9 tablet  3  . topiramate (TOPAMAX) 50 MG  tablet Take 50 mg by mouth 2 (two) times daily.       No current facility-administered medications for this visit.    Allergies  Allergen Reactions  . Ciprofloxacin   . Keflex [Cephalexin] Hives    Family History  Problem Relation Age of Onset  . Cancer Mother   . Depression Mother   . Cancer Father   . Hyperlipidemia Father     History   Social History  . Marital Status: Single    Spouse Name: N/A    Number of Children: N/A  . Years of Education: N/A   Occupational History  . Not on file.   Social History Main Topics  . Smoking status: Never Smoker   . Smokeless tobacco: Never Used  . Alcohol Use: Yes     Comment: occasional  . Drug Use: No  . Sexual Activity: Yes    Partners: Male   Other Topics Concern  . Not on file   Social History Narrative  . No narrative on file     Constitutional: Denies fever,  headache or abrupt weight changes.  HEENT: Denies eye pain, eye redness, ear pain, ringing in the ears, wax buildup, runny nose, nasal congestion, bloody nose, or sore throat. Respiratory: Denies difficulty breathing,  shortness of breath, cough or sputum production.   Cardiovascular: Denies chest pain, chest tightness, palpitations or swelling in the hands or feet.    No other specific complaints in a complete review of systems (except as listed in HPI above).     Objective:   Physical Exam  BP 106/80  Pulse 96  Temp(Src) 98.2 F (36.8 C) (Oral)  Wt 194 lb (87.998 kg)  SpO2 99% Wt Readings from Last 3 Encounters:  05/17/14 194 lb (87.998 kg)  01/22/14 189 lb 12.8 oz (86.093 kg)  01/12/14 186 lb 4 oz (84.482 kg)    General: Appears their stated age, well developed, well nourished in NAD.  HEENT: Eyes: pupils dilated, reactive to light.hroat/Mouth: Teeth present, mucosa pink and moist, no exudate, lesions or ulcerations noted.  Neck: . Neck supple, trachea midline. No lymphadenopathy noted.  Cardiovascular: Normal rate and rhythm. S1,S2 noted.   No murmur, rubs or gallops noted.. Pulmonary/Chest: Normal effort and positive vesicular breath sounds. No respiratory distress. No wheezes, rales or ronchi noted.   BMET No results found for this basename: na,  k,  cl,  co2,  glucose,  bun,  creatinine,  calcium,  gfrnonaa,  gfraa    Lipid Panel  No results found for this basename: chol,  trig,  hdl,  cholhdl,  vldl,  ldlcalc    CBC No results found for this basename: wbc,  rbc,  hgb,  hct,  plt,  mcv,  mch,  mchc,  rdw,  neutrabs,  lymphsabs,  monoabs,  eosabs,  basosabs    Hgb A1C No results found for this basename: HGBA1C         Assessment & Plan:   .1. Other malaise and fatigue  2. Hoarseness

## 2014-05-17 NOTE — Progress Notes (Signed)
Pre visit review using our clinic review tool, if applicable. No additional management support is needed unless otherwise documented below in the visit note. 

## 2014-05-17 NOTE — Patient Instructions (Addendum)
Hoarseness Hoarseness is produced from a variety of causes. It is important to find the cause so it can be treated. In the absence of a cold or upper respiratory illness, any hoarseness lasting more than 2 weeks should be looked at by a specialist. This is especially important if you have a history of smoking or alcohol use. It is also important to keep in mind that as you grow older, your voice will naturally get weaker, making it easier for you to become hoarse from straining your vocal cords.  CAUSES  Any illness that affects your vocal cords can result in a hoarse voice. Examples of conditions that can affect the vocal cords are listed as follows:   Allergies.  Colds.  Sinusitis.  Gastroesophageal reflux disease.  Croup.  Injury.  Nodules.  Exposure to smoke or toxic fumes or gases.  Congenital and genetic defects.  Paralysis of the vocal cords.  Infections.  Advanced age. DIAGNOSIS  In order to diagnose the cause of your hoarseness, your caregiver will examine your throat using an instrument that uses a tube with a small lighted camera (laryngoscope). It allows your caregiver to look into the mouth and down the throat. TREATMENT  For most cases, treatment will focus on the specific cause of the hoarseness. Depending on the cause, hoarseness can be a temporary condition (acute) or it can be long lasting (chronic). Most cases of hoarseness clear up without complications. Your caregiver will explain to you if this is not likely to happen. SEEK IMMEDIATE MEDICAL CARE IF:   You have increasing hoarseness or loss of voice.  You have shortness of breath.  You are coughing up blood.  There is pain in your neck or throat. Document Released: 08/07/2005 Document Revised: 11/16/2011 Document Reviewed: 10/30/2010 Exit[employer] Patient Information 2015 [employer]. This information is not intended to replace advice given to you by your health care provider. Make sure you discuss any  questions you have with your health care provider.  

## 2014-05-17 NOTE — Progress Notes (Signed)
HPI  Pt presents to the clinic today with c/o fatigue and hoarseness. She reports this started about 1 month ago. She denies fever, chills or other URI symptoms. She feels like she wants to sleep all the time and is still exhausted. She was changed to a generic thyroid medication recently. She reports that she had unusual spotting between her last periods, she took a home pregnancy test, which was negative. 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 She has tried Benadryl and allergy medication without relief.  Review of Systems      Past Medical History  Diagnosis Date  . Frequent headaches   . Allergy   . Hypothyroidism     Family History  Problem Relation Age of Onset  . Cancer Mother   . Depression Mother   . Cancer Father   . Hyperlipidemia Father     History   Social History  . Marital Status: Single    Spouse Name: N/A    Number of Children: N/A  . Years of Education: N/A   Occupational History  . Not on file.   Social History Main Topics  . Smoking status: Never Smoker   . Smokeless tobacco: Never Used  . Alcohol Use: Yes     Comment: occasional  . Drug Use: No  . Sexual Activity: Yes    Partners: Male   Other Topics Concern  . Not on file   Social History Narrative  . No narrative on file    Allergies  Allergen Reactions  . Ciprofloxacin   . Keflex [Cephalexin] Hives     Constitutional: Positive fatigue. Denies headache ,fever or abrupt weight changes.  HEENT:  Positive sore throat. Denies eye redness, eye pain, pressure behind the eyes, facial pain, nasal congestion, ear pain, ringing in the ears, wax buildup, runny nose or bloody nose. Respiratory:  Denies cough, difficulty breathing or shortness of breath.  Cardiovascular: Denies chest pain, chest tightness, palpitations or swelling in the hands or feet.   No other specific complaints in a complete review of systems (except as listed in HPI above).  Objective:   BP  106/80  Pulse 96  Temp(Src) 98.2 F (36.8 C) (Oral)  Wt 194 lb (87.998 kg)  SpO2 99% Wt Readings from Last 3 Encounters:  05/17/14 194 lb (87.998 kg)  01/22/14 189 lb 12.8 oz (86.093 kg)  01/12/14 186 lb 4 oz (84.482 kg)     General: Appears her stated age, well developed, well nourished in NAD. HEENT: Throat/Mouth: + PND. Teeth present, mucosa pink and moist, no exudate noted, no lesions or ulcerations noted.   Cardiovascular: Normal rate and rhythm. S1,S2 noted.  No murmur, rubs or gallops noted. No JVD or BLE edema. No carotid bruits noted. Pulmonary/Chest: Normal effort and positive vesicular breath sounds. No respiratory distress. No wheezes, rales or ronchi noted.      Assessment & Plan:   Fatigue and Hoarseness:  Will check labs- CBC, CMET, mono spot TSH, Free T4, Serum, HcG, FSh and LH ? Change to generic synthroid Continue allergy medication at this time  RTC as needed or if symptoms persist. Will follow up as soon as labs are back

## 2014-05-18 ENCOUNTER — Telehealth: Payer: Self-pay | Admitting: Internal Medicine

## 2014-05-18 ENCOUNTER — Telehealth: Payer: Self-pay

## 2014-05-18 LAB — T4, FREE: FREE T4: 1.3 ng/dL (ref 0.60–1.60)

## 2014-05-18 LAB — HCG, QUANTITATIVE, PREGNANCY: Quantitative HCG: 0.19 m[IU]/mL

## 2014-05-18 LAB — COMPREHENSIVE METABOLIC PANEL
ALT: 8 U/L (ref 0–35)
AST: 13 U/L (ref 0–37)
Albumin: 3.9 g/dL (ref 3.5–5.2)
Alkaline Phosphatase: 51 U/L (ref 39–117)
BILIRUBIN TOTAL: 0.2 mg/dL (ref 0.2–1.2)
BUN: 13 mg/dL (ref 6–23)
CO2: 29 mEq/L (ref 19–32)
Calcium: 9.6 mg/dL (ref 8.4–10.5)
Chloride: 104 mEq/L (ref 96–112)
Creatinine, Ser: 1.1 mg/dL (ref 0.4–1.2)
GFR: 59.13 mL/min — ABNORMAL LOW (ref >60.00)
Glucose, Bld: 83 mg/dL (ref 70–99)
Potassium: 4.2 mEq/L (ref 3.5–5.1)
Sodium: 138 mEq/L (ref 135–145)
Total Protein: 7 g/dL (ref 6.0–8.3)

## 2014-05-18 LAB — TSH: TSH: 0.4 u[IU]/mL (ref 0.35–4.50)

## 2014-05-18 LAB — CBC
HEMATOCRIT: 35.1 % — AB (ref 36.0–46.0)
Hemoglobin: 11.7 g/dL — ABNORMAL LOW (ref 12.0–15.0)
MCHC: 33.4 g/dL (ref 30.0–36.0)
MCV: 89.4 fl (ref 78.0–100.0)
PLATELETS: 287 10*3/uL (ref 150.0–400.0)
RBC: 3.93 Mil/uL (ref 3.87–5.11)
RDW: 13.9 % (ref 11.5–15.5)
WBC: 6.3 10*3/uL (ref 4.0–10.5)

## 2014-05-18 LAB — MONONUCLEOSIS SCREEN: Mono Screen: NEGATIVE

## 2014-05-18 LAB — FOLLICLE STIMULATING HORMONE: FSH: 5.2 m[IU]/mL

## 2014-05-18 LAB — LUTEINIZING HORMONE: LH: 3.27 m[IU]/mL

## 2014-05-18 NOTE — Telephone Encounter (Signed)
Please call patient with lab results.  This is the second time she has called today and she's very worried.  She said if you get her voice mail, you can leave a detailed message.

## 2014-05-18 NOTE — Telephone Encounter (Signed)
They are not back yet

## 2014-05-18 NOTE — Telephone Encounter (Signed)
Pt left v/m requesting lab results from 05/17/14 when available.

## 2014-05-21 ENCOUNTER — Telehealth: Payer: Self-pay | Admitting: Internal Medicine

## 2014-05-21 NOTE — Telephone Encounter (Signed)
Patient returned your call.

## 2014-05-28 ENCOUNTER — Other Ambulatory Visit: Payer: BC Managed Care – PPO

## 2014-05-31 ENCOUNTER — Ambulatory Visit (INDEPENDENT_AMBULATORY_CARE_PROVIDER_SITE_OTHER): Payer: BC Managed Care – PPO | Admitting: Internal Medicine

## 2014-05-31 ENCOUNTER — Encounter: Payer: Self-pay | Admitting: Internal Medicine

## 2014-05-31 VITALS — BP 134/76 | HR 93 | Temp 98.1°F | Wt 186.0 lb

## 2014-05-31 DIAGNOSIS — Z77120 Contact with and (suspected) exposure to mold (toxic): Secondary | ICD-10-CM

## 2014-05-31 DIAGNOSIS — R490 Dysphonia: Secondary | ICD-10-CM

## 2014-05-31 NOTE — Patient Instructions (Signed)
Hoarseness Hoarseness is produced from a variety of causes. It is important to find the cause so it can be treated. In the absence of a cold or upper respiratory illness, any hoarseness lasting more than 2 weeks should be looked at by a specialist. This is especially important if you have a history of smoking or alcohol use. It is also important to keep in mind that as you grow older, your voice will naturally get weaker, making it easier for you to become hoarse from straining your vocal cords.  CAUSES  Any illness that affects your vocal cords can result in a hoarse voice. Examples of conditions that can affect the vocal cords are listed as follows:   Allergies.  Colds.  Sinusitis.  Gastroesophageal reflux disease.  Croup.  Injury.  Nodules.  Exposure to smoke or toxic fumes or gases.  Congenital and genetic defects.  Paralysis of the vocal cords.  Infections.  Advanced age. DIAGNOSIS  In order to diagnose the cause of your hoarseness, your caregiver will examine your throat using an instrument that uses a tube with a small lighted camera (laryngoscope). It allows your caregiver to look into the mouth and down the throat. TREATMENT  For most cases, treatment will focus on the specific cause of the hoarseness. Depending on the cause, hoarseness can be a temporary condition (acute) or it can be long lasting (chronic). Most cases of hoarseness clear up without complications. Your caregiver will explain to you if this is not likely to happen. SEEK IMMEDIATE MEDICAL CARE IF:   You have increasing hoarseness or loss of voice.  You have shortness of breath.  You are coughing up blood.  There is pain in your neck or throat. Document Released: 08/07/2005 Document Revised: 11/16/2011 Document Reviewed: 10/30/2010 ExitCare Patient Information 2015 [employer]. This information is not intended to replace advice given to you by your health care provider. Make sure you discuss any  questions you have with your health care provider.  
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
## 2014-06-13 ENCOUNTER — Other Ambulatory Visit: Payer: Self-pay | Admitting: *Deleted

## 2014-06-13 MED ORDER — NORTRIPTYLINE HCL 25 MG PO CAPS: 25.0000 mg | ORAL_CAPSULE | Freq: Every day | ORAL | Status: DC

## 2014-06-14 ENCOUNTER — Other Ambulatory Visit: Payer: Self-pay | Admitting: Internal Medicine

## 2014-06-14 ENCOUNTER — Telehealth: Payer: Self-pay | Admitting: Internal Medicine

## 2014-06-14 DIAGNOSIS — R490 Dysphonia: Secondary | ICD-10-CM

## 2014-06-14 NOTE — Telephone Encounter (Signed)
Referral placed.

## 2014-06-14 NOTE — Telephone Encounter (Signed)
Pt states she went to allergist and they ran tests, but suggests that she also see an ENT for her throat.  Can you place referral for ENT? Thank you.

## 2014-06-29 ENCOUNTER — Other Ambulatory Visit: Payer: Self-pay

## 2014-06-29 MED ORDER — ALPRAZOLAM 0.25 MG PO TABS: ORAL_TABLET | ORAL | Status: DC

## 2014-06-29 NOTE — Telephone Encounter (Signed)
Rx called in to pharmacy. 

## 2014-06-29 NOTE — Telephone Encounter (Signed)
Ok to phone in xanax 

## 2014-06-29 NOTE — Telephone Encounter (Signed)
Last filled 05/08/2014--please advise

## 2014-08-14 ENCOUNTER — Other Ambulatory Visit: Payer: Self-pay | Admitting: *Deleted

## 2014-08-14 MED ORDER — LEVOTHYROXINE SODIUM 137 MCG PO TABS: ORAL_TABLET | ORAL | Status: DC

## 2014-08-29 ENCOUNTER — Other Ambulatory Visit: Payer: Self-pay | Admitting: Internal Medicine

## 2014-08-29 ENCOUNTER — Other Ambulatory Visit: Payer: Self-pay

## 2014-08-29 MED ORDER — ALPRAZOLAM 0.25 MG PO TABS: ORAL_TABLET | ORAL | Status: DC

## 2014-08-29 NOTE — Telephone Encounter (Signed)
OK to phone in xanax 

## 2014-08-29 NOTE — Telephone Encounter (Signed)
Medication phoned to [facility] as instructed.pt notified done.

## 2014-08-29 NOTE — Telephone Encounter (Signed)
Pt said [facility] had requested multiple times for refill of levothyroxine and xanax; spoke with heidi at [facility] and levothyroxine is ready for pick up . Pt is leaving town for 2 weeks today at 2 pm. Pt request cb when xanax refilled.Please advise.

## 2014-10-09 ENCOUNTER — Other Ambulatory Visit: Payer: Self-pay | Admitting: Internal Medicine

## 2014-10-10 NOTE — Telephone Encounter (Signed)
Ok to phone in xanax. No CSA or UDS to review, will need to come to lab to set this up

## 2014-10-10 NOTE — Telephone Encounter (Signed)
Last filled 08/29/2014--please advise

## 2014-10-11 NOTE — Telephone Encounter (Signed)
Rx called into pharmacy Left detailed msg on VM per HIPAA for pt to come in for CSA to be signed before medication could be refilled next time

## 2014-11-04 ENCOUNTER — Other Ambulatory Visit: Payer: Self-pay | Admitting: Neurology

## 2014-11-05 ENCOUNTER — Encounter: Payer: Self-pay | Admitting: Internal Medicine

## 2014-11-05 ENCOUNTER — Ambulatory Visit (INDEPENDENT_AMBULATORY_CARE_PROVIDER_SITE_OTHER): Payer: BC Managed Care – PPO | Admitting: Internal Medicine

## 2014-11-05 VITALS — BP 136/82 | HR 104 | Temp 98.4°F | Wt 208.0 lb

## 2014-11-05 DIAGNOSIS — R635 Abnormal weight gain: Secondary | ICD-10-CM

## 2014-11-05 DIAGNOSIS — Z3041 Encounter for surveillance of contraceptive pills: Secondary | ICD-10-CM

## 2014-11-05 DIAGNOSIS — G4700 Insomnia, unspecified: Secondary | ICD-10-CM

## 2014-11-05 LAB — TSH: TSH: 1.35 u[IU]/mL (ref 0.35–4.50)

## 2014-11-05 LAB — CBC
HCT: 38.2 % (ref 36.0–46.0)
Hemoglobin: 12.8 g/dL (ref 12.0–15.0)
MCHC: 33.5 g/dL (ref 30.0–36.0)
MCV: 86.3 fl (ref 78.0–100.0)
PLATELETS: 350 10*3/uL (ref 150.0–400.0)
RBC: 4.43 Mil/uL (ref 3.87–5.11)
RDW: 14.2 % (ref 11.5–15.5)
WBC: 6.6 10*3/uL (ref 4.0–10.5)

## 2014-11-05 LAB — COMPREHENSIVE METABOLIC PANEL
ALT: 10 U/L (ref 0–35)
AST: 14 U/L (ref 0–37)
Albumin: 4 g/dL (ref 3.5–5.2)
Alkaline Phosphatase: 70 U/L (ref 39–117)
BUN: 8 mg/dL (ref 6–23)
CALCIUM: 8.8 mg/dL (ref 8.4–10.5)
CHLORIDE: 104 meq/L (ref 96–112)
CO2: 29 mEq/L (ref 19–32)
CREATININE: 1.03 mg/dL (ref 0.40–1.20)
GFR: 62.31 mL/min (ref >60.00)
Glucose, Bld: 120 mg/dL — ABNORMAL HIGH (ref 70–99)
Potassium: 3.8 mEq/L (ref 3.5–5.1)
Sodium: 138 mEq/L (ref 135–145)
Total Bilirubin: 0.3 mg/dL (ref 0.2–1.2)
Total Protein: 7.4 g/dL (ref 6.0–8.3)

## 2014-11-05 LAB — T4, FREE: Free T4: 0.92 ng/dL (ref 0.60–1.60)

## 2014-11-05 LAB — VITAMIN D 25 HYDROXY (VIT D DEFICIENCY, FRACTURES): VITD: 35.42 ng/mL (ref 30.00–100.00)

## 2014-11-05 LAB — T3, FREE: T3, Free: 3.8 pg/mL (ref 2.3–4.2)

## 2014-11-05 MED ORDER — AMETHIA 0.15-0.03 &0.01 MG PO TABS: 1.0000 | ORAL_TABLET | Freq: Every day | ORAL | Status: DC

## 2014-11-05 MED ORDER — TRAZODONE HCL 50 MG PO TABS: 25.0000 mg | ORAL_TABLET | Freq: Every evening | ORAL | Status: DC | PRN
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
## 2014-11-05 NOTE — Progress Notes (Signed)
   Subjective:    Patient ID: [name], female    DOB: [birth year], 44 y.o.   MRN: [id number]  HPI [name] is a 44 year old female who presents today with chief complaint of insomnia and weight gain.  She has had some recent stressors in the past few months and has only been able to sleep 2-3 hours per night.     Review of Systems     Objective:   Physical Exam        Assessment & Plan:

## 2014-11-05 NOTE — Patient Instructions (Addendum)
Insomnia Insomnia is frequent trouble falling and/or staying asleep. Insomnia can be a long term problem or a short term problem. Both are common. Insomnia can be a short term problem when the wakefulness is related to a certain stress or worry. Long term insomnia is often related to ongoing stress during waking hours and/or poor sleeping habits. Overtime, sleep deprivation itself can make the problem worse. Every little thing feels more severe because you are overtired and your ability to cope is decreased. CAUSES   Stress, anxiety, and depression.  Poor sleeping habits.  Distractions such as TV in the bedroom.  Naps close to bedtime.  Engaging in emotionally charged conversations before bed.  Technical reading before sleep.  Alcohol and other sedatives. They may make the problem worse. They can hurt normal sleep patterns and normal dream activity.  Stimulants such as caffeine for several hours prior to bedtime.  Pain syndromes and shortness of breath can cause insomnia.  Exercise late at night.  Changing time zones may cause sleeping problems (jet lag). It is sometimes helpful to have someone observe your sleeping patterns. They should look for periods of not breathing during the night (sleep apnea). They should also look to see how long those periods last. If you live alone or observers are uncertain, you can also be observed at a sleep clinic where your sleep patterns will be professionally monitored. Sleep apnea requires a checkup and treatment. Give your caregivers your medical history. Give your caregivers observations your family has made about your sleep.  SYMPTOMS   Not feeling rested in the morning.  Anxiety and restlessness at bedtime.  Difficulty falling and staying asleep. TREATMENT   Your caregiver may prescribe treatment for an underlying medical disorders. Your caregiver can give advice or help if you are using alcohol or other drugs for self-medication. Treatment  of underlying problems will usually eliminate insomnia problems.  Medications can be prescribed for short time use. They are generally not recommended for lengthy use.  Over-the-counter sleep medicines are not recommended for lengthy use. They can be habit forming.  You can promote easier sleeping by making lifestyle changes such as:  Using relaxation techniques that help with breathing and reduce muscle tension.  Exercising earlier in the day.  Changing your diet and the time of your last meal. No night time snacks.  Establish a regular time to go to bed.  Counseling can help with stressful problems and worry.  Soothing music and white noise may be helpful if there are background noises you cannot remove.  Stop tedious detailed work at least one hour before bedtime. HOME CARE INSTRUCTIONS   Keep a diary. Inform your caregiver about your progress. This includes any medication side effects. See your caregiver regularly. Take note of:  Times when you are asleep.  Times when you are awake during the night.  The quality of your sleep.  How you feel the next day. This information will help your caregiver care for you.  Get out of bed if you are still awake after 15 minutes. Read or do some quiet activity. Keep the lights down. Wait until you feel sleepy and go back to bed.  Keep regular sleeping and waking hours. Avoid naps.  Exercise regularly.  Avoid distractions at bedtime. Distractions include watching television or engaging in any intense or detailed activity like attempting to balance the household checkbook.  Develop a bedtime ritual. Keep a familiar routine of bathing, brushing your teeth, climbing into bed at the same   time each night, listening to soothing music. Routines increase the success of falling to sleep faster.  Use relaxation techniques. This can be using breathing and muscle tension release routines. It can also include visualizing peaceful scenes. You can  also help control troubling or intruding thoughts by keeping your mind occupied with boring or repetitive thoughts like the old concept of counting sheep. You can make it more creative like imagining planting one beautiful flower after another in your backyard garden.  During your day, work to eliminate stress. When this is not possible use some of the previous suggestions to help reduce the anxiety that accompanies stressful situations. MAKE SURE YOU:   Understand these instructions.  Will watch your condition.  Will get help right away if you are not doing well or get worse. Document Released: 08/21/2000 Document Revised: 11/16/2011 Document Reviewed: 09/21/2007 ExitCare Patient Information 2015 [employer]. This information is not intended to replace advice given to you by your health care provider. Make sure you discuss any questions you have with your health care provider.  

## 2014-11-05 NOTE — Progress Notes (Signed)
Pre visit review using our clinic review tool, if applicable. No additional management support is needed unless otherwise documented below in the visit note. 

## 2014-11-06 MED ORDER — LEVOTHYROXINE SODIUM 137 MCG PO TABS: ORAL_TABLET | ORAL | Status: DC

## 2014-11-06 NOTE — Addendum Note (Signed)
Addended by: Lurlean Nanny on: 11/06/2014 03:52 PM   Modules accepted: Orders

## 2014-11-29 ENCOUNTER — Other Ambulatory Visit: Payer: Self-pay | Admitting: Internal Medicine

## 2014-12-05 ENCOUNTER — Other Ambulatory Visit: Payer: Self-pay

## 2014-12-05 NOTE — Telephone Encounter (Signed)
Pt left v/m; namebrand thyroid med is too expensive and not covered by ins and pt paying out of pocket; pt request switched to generic thyroid med.Please advise. Pt request cb.

## 2014-12-06 NOTE — Telephone Encounter (Signed)
Please verify with patient. Last lab note from [facility] stated she didn't tolerate the generic rx.  Thanks.

## 2014-12-09 ENCOUNTER — Other Ambulatory Visit: Payer: Self-pay | Admitting: Internal Medicine

## 2014-12-10 NOTE — Telephone Encounter (Signed)
Patient left a voicemail checking on the status of this. Please let patient know what is going on.

## 2014-12-10 NOTE — Telephone Encounter (Signed)
We can try the generic, if she does not tolerate, she will need to get the brand

## 2014-12-11 MED ORDER — LEVOTHYROXINE SODIUM 137 MCG PO TABS: ORAL_TABLET | ORAL | Status: DC

## 2014-12-11 NOTE — Telephone Encounter (Signed)
New Rx for generic sent to pharmacy

## 2014-12-11 NOTE — Addendum Note (Signed)
Addended by: Lurlean Nanny on: 12/11/2014 09:59 AM   Modules accepted: Orders

## 2014-12-12 ENCOUNTER — Other Ambulatory Visit: Payer: Self-pay | Admitting: Internal Medicine

## 2014-12-12 MED ORDER — ALPRAZOLAM 0.25 MG PO TABS: ORAL_TABLET | ORAL | Status: DC

## 2014-12-12 NOTE — Telephone Encounter (Signed)
10/10/2014--please advise--last UDS 11/05/14 was negative for Xanax but pt takes PRN...positive for nortriptyiline and alcohol--

## 2014-12-12 NOTE — Addendum Note (Signed)
Addended by: Lurlean Nanny on: 12/12/2014 04:04 PM   Modules accepted: Orders

## 2014-12-12 NOTE — Telephone Encounter (Signed)
Ok to phone in xanax 

## 2014-12-12 NOTE — Telephone Encounter (Signed)
Pt left v/m requesting status of generic thyroid med to [facility]. [facility] and [facility] ready for [facility] # 30 with cost to pt $12.00. Left v/m notifying pt.

## 2014-12-13 NOTE — Telephone Encounter (Signed)
Rx called in to pharmacy. 

## 2014-12-19 ENCOUNTER — Encounter: Payer: Self-pay | Admitting: Primary Care

## 2014-12-19 ENCOUNTER — Telehealth: Payer: Self-pay

## 2014-12-19 ENCOUNTER — Ambulatory Visit (INDEPENDENT_AMBULATORY_CARE_PROVIDER_SITE_OTHER): Payer: BC Managed Care – PPO | Admitting: Primary Care

## 2014-12-19 VITALS — BP 132/88 | HR 92 | Temp 98.1°F | Ht 65.0 in | Wt 204.8 lb

## 2014-12-19 DIAGNOSIS — B379 Candidiasis, unspecified: Secondary | ICD-10-CM | POA: Diagnosis not present

## 2014-12-19 DIAGNOSIS — K122 Cellulitis and abscess of mouth: Secondary | ICD-10-CM | POA: Diagnosis not present

## 2014-12-19 DIAGNOSIS — B3731 Acute candidiasis of vulva and vagina: Secondary | ICD-10-CM

## 2014-12-19 DIAGNOSIS — T3695XA Adverse effect of unspecified systemic antibiotic, initial encounter: Secondary | ICD-10-CM

## 2014-12-19 DIAGNOSIS — B373 Candidiasis of vulva and vagina: Secondary | ICD-10-CM

## 2014-12-19 MED ORDER — AMOXICILLIN 500 MG PO CAPS: 500.0000 mg | ORAL_CAPSULE | Freq: Two times a day (BID) | ORAL | Status: DC

## 2014-12-19 MED ORDER — FLUCONAZOLE 150 MG PO TABS: 150.0000 mg | ORAL_TABLET | Freq: Once | ORAL | Status: DC

## 2014-12-19 NOTE — Telephone Encounter (Signed)
Spoke with [name], [facility]. Patient has not experienced a reaction to Amoxicillin in the past, just Keflex. Pharmacists verified with patient as well.

## 2014-12-19 NOTE — Telephone Encounter (Signed)
Anna at [facility] left v/m; received amoxicillin rx and [facility]. Anna request cb.

## 2014-12-19 NOTE — Patient Instructions (Signed)
Start Amoxicillin antibiotic for abscess of gums. Take one tablet by mouth twice daily for 7 days. Use oral gel topically for pain. Use Fluconazole tablet once as needed for development of post antibiotic yeast infection. Call me if no improvement or worsened symptoms in the next 3-4 days. Take care!

## 2014-12-19 NOTE — Progress Notes (Signed)
Subjective:    Patient ID: [name], female    DOB: [birth year], 44 y.o.   MRN: [id number]  HPI  Ms. Slight is a 44 year old female who presents today with a chief complaint of an enlarged "spot" that is located to the left lower posterior gumline. She first noticed the spot 5 days ago and thought it was a chancre sore. Over the past several days the sore has enlarged, become more painful, and has developed pain to left side of her neck up to base of ear. She denies tooth pain, fevers, chills, drainage, sore throat. Does reports left sided headache over the past 2 days. She has not taken any medication over the counter for her pain.  Review of Systems  Constitutional: Negative for fever and chills.  HENT: Negative for congestion, ear pain, rhinorrhea, sinus pressure and sore throat.   Respiratory: Negative for shortness of breath.   Cardiovascular: Negative for chest pain.  Gastrointestinal: Negative for nausea and vomiting.  Musculoskeletal: Positive for neck pain.  Neurological: Positive for headaches.       Past Medical History  Diagnosis Date  . Frequent headaches   . Allergy   . Hypothyroidism     History   Social History  . Marital Status: Single    Spouse Name: N/A  . Number of Children: N/A  . Years of Education: N/A   Occupational History  . Not on file.   Social History Main Topics  . Smoking status: Never Smoker   . Smokeless tobacco: Never Used  . Alcohol Use: Yes     Comment: occasional  . Drug Use: No  . Sexual Activity:    Partners: Male   Other Topics Concern  . Not on file   Social History Narrative    Past Surgical History  Procedure Laterality Date  . Appendectomy  2013  . Tonsillectomy  2012  . Lapband  2013    Family History  Problem Relation Age of Onset  . Cancer Mother   . Depression Mother   . Cancer Father   . Hyperlipidemia Father     Allergies  Allergen Reactions  . Ciprofloxacin   . Keflex [Cephalexin]  Hives    Current Outpatient Prescriptions on File Prior to Visit  Medication Sig Dispense Refill  . ALPRAZolam (XANAX) 0.25 MG tablet TAKE 1 TABLET BY MOUTH 2 TIMES A DAY AS NEEDED FOR ANXIETY 20 tablet 0  . AMETHIA 0.15-0.03 &0.01 MG tablet Take 1 tablet by mouth daily. 1 Package 0  . levocetirizine (XYZAL) 5 MG tablet Take 5 mg by mouth every evening.     [name] levothyroxine (SYNTHROID, LEVOTHROID) 137 MCG tablet .TAKE 1 TABLET (137 MCG TOTAL) BY MOUTH DAILY BEFORE BREAKFAST. 30 tablet 5  . metoprolol (LOPRESSOR) 50 MG tablet Take 50 mg by mouth at bedtime.    . nortriptyline (PAMELOR) 25 MG capsule TAKE 1 CAPSULE (25 MG TOTAL) BY MOUTH AT BEDTIME. 30 capsule 3  . Prenatal Vit-Fe Fumarate-FA (PRENATAL MULTIVITAMIN) TABS tablet Take 1 tablet by mouth daily at 12 noon.    . SUMAtriptan (IMITREX) 100 MG tablet Take 1 tablet (100 mg total) by mouth once as needed for migraine or headache. May repeat once in 2 hours if headache persists or recurs.  Do not exceed two tabs in 24h 9 tablet 3  . topiramate (TOPAMAX) 50 MG tablet Take 50 mg by mouth 2 (two) times daily.    . traZODone (DESYREL) 50 MG tablet TAKE 1/2-1  TABLET BY MOUTH AT BEDTIME AS NEEDED FOR SLEEP. 30 tablet 0  . eletriptan (RELPAX) 20 MG tablet Take 20 mg by mouth as needed for migraine or headache. One tablet by mouth at onset of headache. May repeat in 2 hours if headache persists or recurs.     No current facility-administered medications on file prior to visit.    BP 132/88 mmHg  Pulse 92  Temp(Src) 98.1 F (36.7 C) (Oral)  Ht 5\' 5"  (1.651 m)  Wt 204 lb 12.8 oz (92.897 kg)  BMI 34.08 kg/m2  SpO2 98%    Objective:   Physical Exam  Constitutional: She is oriented to person, place, and time. She appears well-developed.  HENT:  Right Ear: Tympanic membrane and ear canal normal.  Left Ear: Tympanic membrane and ear canal normal.  Nose: Nose normal.  Mouth/Throat: No oropharyngeal exudate.    1 cm area of inflammation  present to left lower posterior side of gum line. No obvious tooth involvement, foul odor, or drainage.  Neck:  Tender upon palpation to left neck. No swelling.  Cardiovascular: Normal rate and regular rhythm.   Pulmonary/Chest: Effort normal and breath sounds normal.  Neurological: She is alert and oriented to person, place, and time.  Skin: Skin is warm and dry.  Psychiatric: She has a normal mood and affect.          Assessment & Plan:  Abscess of soft tissue of mouth:  1cm enlargement, doesn't appear to be a salivary stone or duct blockage. Slight erythema, no drainage. Due to recent enlargement, worsened pain, and new neck pain involvement, will do Amoxicllin BID for 7 days. Oral Gel topically for pain. Follow up if no improvement or if symptoms worsen in the next 3-4 days. She has had amoxicillin before without any reactions.

## 2014-12-19 NOTE — Progress Notes (Signed)
Pre visit review using our clinic review tool, if applicable. No additional management support is needed unless otherwise documented below in the visit note. 

## 2015-01-01 ENCOUNTER — Encounter: Payer: Self-pay | Admitting: Internal Medicine

## 2015-01-31 ENCOUNTER — Other Ambulatory Visit: Payer: Self-pay | Admitting: Internal Medicine

## 2015-01-31 NOTE — Telephone Encounter (Signed)
Rx called in to pharmacy. 

## 2015-01-31 NOTE — Telephone Encounter (Signed)
Last filled 12/12/2014--please advise

## 2015-01-31 NOTE — Telephone Encounter (Signed)
Ok to phone in xanax 

## 2015-02-06 ENCOUNTER — Ambulatory Visit (INDEPENDENT_AMBULATORY_CARE_PROVIDER_SITE_OTHER): Payer: BC Managed Care – PPO | Admitting: Primary Care

## 2015-02-06 ENCOUNTER — Encounter: Payer: Self-pay | Admitting: Primary Care

## 2015-02-06 VITALS — BP 128/92 | HR 79 | Temp 98.2°F | Ht 65.0 in | Wt 216.4 lb

## 2015-02-06 DIAGNOSIS — B379 Candidiasis, unspecified: Secondary | ICD-10-CM

## 2015-02-06 DIAGNOSIS — T3695XA Adverse effect of unspecified systemic antibiotic, initial encounter: Secondary | ICD-10-CM

## 2015-02-06 DIAGNOSIS — R05 Cough: Secondary | ICD-10-CM

## 2015-02-06 DIAGNOSIS — R059 Cough, unspecified: Secondary | ICD-10-CM

## 2015-02-06 MED ORDER — HYDROCODONE-HOMATROPINE 5-1.5 MG/5ML PO SYRP: 5.0000 mL | ORAL_SOLUTION | Freq: Every evening | ORAL | Status: DC | PRN

## 2015-02-06 MED ORDER — DOXYCYCLINE HYCLATE 100 MG PO TABS: 100.0000 mg | ORAL_TABLET | Freq: Two times a day (BID) | ORAL | Status: DC

## 2015-02-06 MED ORDER — BENZONATATE 200 MG PO CAPS: 200.0000 mg | ORAL_CAPSULE | Freq: Three times a day (TID) | ORAL | Status: DC | PRN

## 2015-02-06 MED ORDER — FLUCONAZOLE 150 MG PO TABS: 150.0000 mg | ORAL_TABLET | Freq: Once | ORAL | Status: DC

## 2015-02-06 NOTE — Progress Notes (Signed)
Pre visit review using our clinic review tool, if applicable. No additional management support is needed unless otherwise documented below in the visit note. 

## 2015-02-06 NOTE — Patient Instructions (Signed)
Take Doxycycline antibiotics. Take 1 tablet by mouth twice daily for 7 days. You may take the Benzonatate capsules three times daily as needed for daytime cough. You may take the Hycodan cough syrup at bedtime as needed for night cough. Increase your intake of water to stay hydrated.  Call me if no improvement in the next 3-4 days. It was nice meeting you!

## 2015-02-06 NOTE — Progress Notes (Signed)
Subjective:    Patient ID: [name], female    DOB: Jul 07, 1971, 44 y.o.   MRN: [id number]  HPI  [name] is a 44 year old female who presents today with a chief complaint of cough. Her symptoms began 2 weeks ago with rhinorrhea, sneezing, and postnasal drip. About 10 days ago she began experiencing a cough, chest congestion, and sore throat. She had a slight fever over the weekend and overall is feeling worse today. She's tried taking Mucinex DM and tylenol with temporary relief.   Review of Systems  Constitutional: Positive for fever, chills and fatigue.  HENT: Positive for congestion, rhinorrhea, sinus pressure and sore throat. Negative for ear pain.   Respiratory: Positive for cough. Negative for shortness of breath.   Cardiovascular: Negative for chest pain.  Gastrointestinal: Negative for nausea.  Musculoskeletal: Negative for myalgias.       Past Medical History  Diagnosis Date  . Frequent headaches   . Allergy   . Hypothyroidism     History   Social History  . Marital Status: Single    Spouse Name: N/A  . Number of Children: N/A  . Years of Education: N/A   Occupational History  . Not on file.   Social History Main Topics  . Smoking status: Never Smoker   . Smokeless tobacco: Never Used  . Alcohol Use: Yes     Comment: occasional  . Drug Use: No  . Sexual Activity:    Partners: Male   Other Topics Concern  . Not on file   Social History Narrative    Past Surgical History  Procedure Laterality Date  . Appendectomy  2013  . Tonsillectomy  2012  . Lapband  2013    Family History  Problem Relation Age of Onset  . Cancer Mother   . Depression Mother   . Cancer Father   . Hyperlipidemia Father     Allergies  Allergen Reactions  . Ciprofloxacin   . Keflex [Cephalexin] Hives    Current Outpatient Prescriptions on File Prior to Visit  Medication Sig Dispense Refill  . ALPRAZolam (XANAX) 0.25 MG tablet TAKE 1 TABLET BY MOUTH  TWICE A DAY AS NEEDED 20 tablet 0  . AMETHIA 0.15-0.03 &0.01 MG tablet Take 1 tablet by mouth daily. 1 Package 0  . eletriptan (RELPAX) 20 MG tablet Take 20 mg by mouth as needed for migraine or headache. One tablet by mouth at onset of headache. May repeat in 2 hours if headache persists or recurs.    [name] levocetirizine (XYZAL) 5 MG tablet Take 5 mg by mouth every evening.     [name] levothyroxine (SYNTHROID, LEVOTHROID) 137 MCG tablet .TAKE 1 TABLET (137 MCG TOTAL) BY MOUTH DAILY BEFORE BREAKFAST. 30 tablet 5  . metoprolol (LOPRESSOR) 50 MG tablet Take 50 mg by mouth at bedtime.    . nortriptyline (PAMELOR) 25 MG capsule TAKE 1 CAPSULE (25 MG TOTAL) BY MOUTH AT BEDTIME. 30 capsule 3  . Prenatal Vit-Fe Fumarate-FA (PRENATAL MULTIVITAMIN) TABS tablet Take 1 tablet by mouth daily at 12 noon.    . SUMAtriptan (IMITREX) 100 MG tablet Take 1 tablet (100 mg total) by mouth once as needed for migraine or headache. May repeat once in 2 hours if headache persists or recurs.  Do not exceed two tabs in 24h 9 tablet 3  . topiramate (TOPAMAX) 50 MG tablet Take 50 mg by mouth daily.     . traZODone (DESYREL) 50 MG tablet TAKE 1/2-1 TABLET BY  MOUTH AT BEDTIME AS NEEDED FOR SLEEP. 30 tablet 0   No current facility-administered medications on file prior to visit.    BP 128/92 mmHg  Pulse 79  Temp(Src) 98.2 F (36.8 C) (Oral)  Ht 5\' 5"  (1.651 m)  Wt 216 lb 6.4 oz (98.158 kg)  BMI 36.01 kg/m2  SpO2 98%    Objective:   Physical Exam  Constitutional: She appears ill.  HENT:  Right Ear: Tympanic membrane and ear canal normal.  Left Ear: Tympanic membrane and ear canal normal.  Nose: Right sinus exhibits maxillary sinus tenderness. Right sinus exhibits no frontal sinus tenderness. Left sinus exhibits maxillary sinus tenderness. Left sinus exhibits no frontal sinus tenderness.  Mouth/Throat: Oropharynx is clear and moist.  Eyes: Conjunctivae are normal. Pupils are equal, round, and reactive to light.  Neck:  Neck supple.  Cardiovascular: Normal rate and regular rhythm.   Pulmonary/Chest: Effort normal and breath sounds normal. She has no wheezes. She has no rales.  Lymphadenopathy:    She has no cervical adenopathy.  Skin: Skin is warm and dry.          Assessment & Plan:  Cough:  Suspect bacterial involvement due to symptoms and duration. Present for 10+ days. Worsening this morning. Doxycycline BID x 7 days, Tessalon Pearls for day cough, Hycodan at night. Follow up if no improvement by Monday next week.

## 2015-02-09 ENCOUNTER — Other Ambulatory Visit: Payer: Self-pay | Admitting: Internal Medicine

## 2015-03-29 ENCOUNTER — Other Ambulatory Visit: Payer: Self-pay

## 2015-03-29 MED ORDER — ALPRAZOLAM 0.25 MG PO TABS: 0.2500 mg | ORAL_TABLET | Freq: Two times a day (BID) | ORAL | Status: DC | PRN

## 2015-03-29 NOTE — Telephone Encounter (Signed)
Last filled 01/31/15--pt needs UDS--please advise

## 2015-03-29 NOTE — Telephone Encounter (Signed)
Rx called in to pharmacy. 

## 2015-03-29 NOTE — Telephone Encounter (Signed)
Ok to phone in Xanax, can she schedule appt for UDS?

## 2015-04-04 ENCOUNTER — Other Ambulatory Visit: Payer: Self-pay | Admitting: Internal Medicine

## 2015-04-30 ENCOUNTER — Other Ambulatory Visit: Payer: Self-pay | Admitting: Internal Medicine

## 2015-04-30 MED ORDER — TRAZODONE HCL 50 MG PO TABS: 25.0000 mg | ORAL_TABLET | Freq: Every evening | ORAL | Status: DC | PRN

## 2015-04-30 NOTE — Telephone Encounter (Signed)
Last filled 03/29/15--will ask pt to come in for CSA before next refill

## 2015-04-30 NOTE — Telephone Encounter (Signed)
Rx sent through e-scribe  

## 2015-04-30 NOTE — Telephone Encounter (Signed)
OK to phone in trazadone

## 2015-06-06 ENCOUNTER — Other Ambulatory Visit: Payer: Self-pay | Admitting: Internal Medicine

## 2015-06-07 NOTE — Telephone Encounter (Signed)
Last filled 03/29/15--please advise---pt will needs UDS before next refill

## 2015-06-10 NOTE — Telephone Encounter (Signed)
Ok to phone in Xanax 

## 2015-06-11 NOTE — Telephone Encounter (Signed)
Rx called in to pharmacy. 

## 2015-07-08 ENCOUNTER — Other Ambulatory Visit: Payer: Self-pay | Admitting: Internal Medicine

## 2015-07-31 ENCOUNTER — Other Ambulatory Visit: Payer: Self-pay | Admitting: Internal Medicine

## 2015-08-05 ENCOUNTER — Ambulatory Visit (INDEPENDENT_AMBULATORY_CARE_PROVIDER_SITE_OTHER): Payer: BC Managed Care – PPO | Admitting: Internal Medicine

## 2015-08-05 ENCOUNTER — Encounter: Payer: Self-pay | Admitting: Internal Medicine

## 2015-08-05 VITALS — BP 144/94 | HR 92 | Temp 98.2°F | Wt 211.5 lb

## 2015-08-05 DIAGNOSIS — R490 Dysphonia: Secondary | ICD-10-CM

## 2015-08-05 DIAGNOSIS — F411 Generalized anxiety disorder: Secondary | ICD-10-CM | POA: Diagnosis not present

## 2015-08-05 DIAGNOSIS — R0982 Postnasal drip: Secondary | ICD-10-CM

## 2015-08-05 MED ORDER — LEVOCETIRIZINE DIHYDROCHLORIDE 5 MG PO TABS: 5.0000 mg | ORAL_TABLET | Freq: Every evening | ORAL | Status: DC

## 2015-08-05 MED ORDER — ALPRAZOLAM 0.25 MG PO TABS
0.2500 mg | ORAL_TABLET | Freq: Two times a day (BID) | ORAL | Status: DC | PRN
Start: 2015-08-05 — End: 2015-09-30

## 2015-08-05 MED ORDER — METHYLPREDNISOLONE ACETATE 80 MG/ML IJ SUSP
80.0000 mg | Freq: Once | INTRAMUSCULAR | Status: AC
  Administered 2015-08-05: 80 mg via INTRAMUSCULAR

## 2015-08-05 NOTE — Progress Notes (Signed)
Pre visit review using our clinic review tool, if applicable. No additional management support is needed unless otherwise documented below in the visit note. 

## 2015-08-05 NOTE — Patient Instructions (Signed)
Laryngitis  Laryngitis is inflammation of your vocal cords. This causes hoarseness, coughing, loss of voice, sore throat, or a dry throat. Your vocal cords are two bands of muscles that are found in your throat. When you speak, these cords come together and vibrate. These vibrations come out through your mouth as sound. When your vocal cords are inflamed, your voice sounds different.  Laryngitis can be temporary (acute) or long-term (chronic). Most cases of acute laryngitis improve with time. Chronic laryngitis is laryngitis that lasts for more than three weeks.  CAUSES  Acute laryngitis may be caused by:   A viral infection.   Lots of talking, yelling, or singing. This is also called vocal strain.   Bacterial infections.  Chronic laryngitis may be caused by:   Vocal strain.   Injury to your vocal cords.   Acid reflux (gastroesophageal reflux disease or GERD).   Allergies.   Sinus infection.   Smoking.   Alcohol abuse.   Breathing in chemicals or dust.   Growths on the vocal cords.  RISK FACTORS  Risk factors for laryngitis include:   Smoking.   Alcohol abuse.   Having allergies.  SIGNS AND SYMPTOMS  Symptoms of laryngitis may include:   Low, hoarse voice.   Loss of voice.   Dry cough.   Sore throat.   Stuffy nose.  DIAGNOSIS  Laryngitis may be diagnosed by:   Physical exam.   Throat culture.   Blood test.   Laryngoscopy. This procedure allows your health care provider to look at your vocal cords with a mirror or viewing tube.  TREATMENT  Treatment for laryngitis depends on what is causing it. Usually, treatment involves resting your voice and using medicines to soothe your throat. However, if your laryngitis is caused by a bacterial infection, you may need to take antibiotic medicine. If your laryngitis is caused by a growth, you may need to have a procedure to remove it.  HOME CARE INSTRUCTIONS   Drink enough fluid to keep your urine clear or pale yellow.   Breathe in moist air. Use a  humidifier if you live in a dry climate.   Take medicines only as directed by your health care provider.   If you were prescribed an antibiotic medicine, finish it all even if you start to feel better.   Do not smoke cigarettes or electronic cigarettes. If you need help quitting, ask your health care provider.   Talk as little as possible. Also avoid whispering, which can cause vocal strain.   Write instead of talking. Do this until your voice is back to normal.  SEEK MEDICAL CARE IF:   You have a fever.   You have increasing pain.   You have difficulty swallowing.  SEEK IMMEDIATE MEDICAL CARE IF:   You cough up blood.   You have trouble breathing.     This information is not intended to replace advice given to you by your health care provider. Make sure you discuss any questions you have with your health care provider.     Document Released: 08/24/2005 Document Revised: 09/14/2014 Document Reviewed: 02/06/2014  Elsevier Interactive Patient Education 2016 Elsevier Inc[employer]

## 2015-08-05 NOTE — Telephone Encounter (Signed)
Ok to phone in Xanax 

## 2015-08-05 NOTE — Progress Notes (Signed)
Subjective:    Patient ID: [name], female    DOB: [birth year], 44 y.o.   MRN: [id number]  HPI  Pt presents to the clinic today with c/o hoarseness and tender lymph nodes. This started 3 days ago. She denies sore throat or difficulty swallowing. She has had some intermittent right ear pain. She denies fever, chills or body aches. She has tried salt water gargles and Ibuprofen without any relief. She does have seasonal allergies but has been out of her Xyzal. She has had sick contacts.  She also wants her Xanax refilled today. She wants to know if she can take it more frequently over the next month. She has an upcoming court case with someone who has been stalking her, and it has made her very anxious. Her court date in December 19th.   Review of Systems      Past Medical History  Diagnosis Date  . Frequent headaches   . Allergy   . Hypothyroidism     Current Outpatient Prescriptions  Medication Sig Dispense Refill  . AMETHIA 0.15-0.03 &0.01 MG tablet Take 1 tablet by mouth daily. 1 Package 0  . levocetirizine (XYZAL) 5 MG tablet Take 1 tablet (5 mg total) by mouth every evening. 30 tablet 2  . levothyroxine (SYNTHROID, LEVOTHROID) 137 MCG tablet Take 1 tablet (137 mcg total) by mouth daily before breakfast. MUST SCHEDULE ANNUAL PHYSICAL FOR MORE REFILLS 30 tablet 1  . Prenatal Vit-Fe Fumarate-FA (PRENATAL MULTIVITAMIN) TABS tablet Take 1 tablet by mouth daily at 12 noon.    . traZODone (DESYREL) 50 MG tablet Take 0.5-1 tablets (25-50 mg total) by mouth at bedtime as needed for sleep. 30 tablet 0  . ALPRAZolam (XANAX) 0.25 MG tablet Take 1 tablet (0.25 mg total) by mouth 2 (two) times daily as needed for anxiety. 30 tablet 0  . metoprolol (LOPRESSOR) 50 MG tablet Take 50 mg by mouth at bedtime.    . nortriptyline (PAMELOR) 25 MG capsule TAKE 1 CAPSULE (25 MG TOTAL) BY MOUTH AT BEDTIME. (Patient not taking: Reported on 08/05/2015) 30 capsule 3   No current  facility-administered medications for this visit.    Allergies  Allergen Reactions  . Ciprofloxacin   . Keflex [Cephalexin] Hives    Family History  Problem Relation Age of Onset  . Cancer Mother   . Depression Mother   . Cancer Father   . Hyperlipidemia Father     Social History   Social History  . Marital Status: Single    Spouse Name: N/A  . Number of Children: N/A  . Years of Education: N/A   Occupational History  . Not on file.   Social History Main Topics  . Smoking status: Never Smoker   . Smokeless tobacco: Never Used  . Alcohol Use: Yes     Comment: occasional  . Drug Use: No  . Sexual Activity:    Partners: Male   Other Topics Concern  . Not on file   Social History Narrative     Constitutional: Denies fever, malaise, fatigue, headache or abrupt weight changes.  HEENT: Pt reports hoarseness. Denies eye pain, eye redness, ear pain, ringing in the ears, wax buildup, runny nose, nasal congestion, bloody nose, or sore throat. Respiratory: Denies difficulty breathing, shortness of breath, cough or sputum production.   Cardiovascular: Denies chest pain, chest tightness, palpitations or swelling in the hands or feet.  Psych: Pt reports anxiety. Denies depression, SI/HI.  No other specific complaints in a complete review  of systems (except as listed in HPI above).  Objective:   Physical Exam  BP 144/94 mmHg  Pulse 92  Temp(Src) 98.2 F (36.8 C) (Oral)  Wt 211 lb 8 oz (95.936 kg)  SpO2 98% Wt Readings from Last 3 Encounters:  08/05/15 211 lb 8 oz (95.936 kg)  02/06/15 216 lb 6.4 oz (98.158 kg)  12/19/14 204 lb 12.8 oz (92.897 kg)    General: Appears her stated age, well developed, well nourished in NAD. HEENT: Head: normal shape and size, sinus tenderness noted; Eyes: sclera white, no icterus, conjunctiva pink, PERRLA and EOMs intact; Ears: Tm's gray and intact, normal light reflex; Nose: mucosa pink and moist, septum midline; Throat/Mouth: Teeth  present, mucosa pink and moist, + PND, no exudate, lesions or ulcerations noted.  Neck:  No adenopathy noted.  Cardiovascular: Normal rate and rhythm. S1,S2 noted.  No murmur, rubs or gallops noted.  Pulmonary/Chest: Normal effort and positive vesicular breath sounds. No respiratory distress. No wheezes, rales or ronchi noted.  Neurological: Alert and oriented.  Psychiatric: She is anxious appearing today.  BMET    Component Value Date/Time   NA 138 11/05/2014 1438   K 3.8 11/05/2014 1438   CL 104 11/05/2014 1438   CO2 29 11/05/2014 1438   GLUCOSE 120* 11/05/2014 1438   BUN 8 11/05/2014 1438   CREATININE 1.03 11/05/2014 1438   CALCIUM 8.8 11/05/2014 1438    Lipid Panel  No results found for: CHOL, TRIG, HDL, CHOLHDL, VLDL, LDLCALC  CBC    Component Value Date/Time   WBC 6.6 11/05/2014 1438   RBC 4.43 11/05/2014 1438   HGB 12.8 11/05/2014 1438   HCT 38.2 11/05/2014 1438   PLT 350.0 11/05/2014 1438   MCV 86.3 11/05/2014 1438   MCHC 33.5 11/05/2014 1438   RDW 14.2 11/05/2014 1438    Hgb A1C No results found for: HGBA1C       Assessment & Plan:   Hoarseness secondary to PND:  Xyzal refilled 80 mg Depo IM today  Continue Ibuprofen and salt water gargles as needed  Anxiety:  Will increase her quantity to # 30 She wants me to increase the dose, I declined  RTC as needed or if symptoms persist or worsen

## 2015-08-05 NOTE — Telephone Encounter (Signed)
Pt was seen in office Rx given at [facility]

## 2015-09-23 ENCOUNTER — Encounter: Payer: BC Managed Care – PPO | Admitting: Internal Medicine

## 2015-09-30 ENCOUNTER — Other Ambulatory Visit: Payer: Self-pay | Admitting: Internal Medicine

## 2015-10-01 NOTE — Telephone Encounter (Signed)
Xanax last filled 08/05/2015--please advise pt has upcoming appt for CPE 11/2015

## 2015-10-01 NOTE — Telephone Encounter (Signed)
Ok to phone in Xanax Synthroid sent electronically

## 2015-10-02 ENCOUNTER — Emergency Department: Payer: Worker's Compensation

## 2015-10-02 ENCOUNTER — Encounter: Payer: Self-pay | Admitting: Emergency Medicine

## 2015-10-02 ENCOUNTER — Emergency Department
Admission: EM | Admit: 2015-10-02 | Discharge: 2015-10-02 | Disposition: A | Discharge status: Home / Self Care | Payer: Worker's Compensation | Attending: Emergency Medicine | Admitting: Emergency Medicine

## 2015-10-02 DIAGNOSIS — Y93F2 Activity, caregiving, lifting: Secondary | ICD-10-CM | POA: Insufficient documentation

## 2015-10-02 DIAGNOSIS — Y990 Civilian activity done for income or pay: Secondary | ICD-10-CM | POA: Insufficient documentation

## 2015-10-02 DIAGNOSIS — Z79899 Other long term (current) drug therapy: Secondary | ICD-10-CM | POA: Diagnosis not present

## 2015-10-02 DIAGNOSIS — Y9289 Other specified places as the place of occurrence of the external cause: Secondary | ICD-10-CM | POA: Diagnosis not present

## 2015-10-02 DIAGNOSIS — S0093XA Contusion of unspecified part of head, initial encounter: Secondary | ICD-10-CM

## 2015-10-02 DIAGNOSIS — S0990XA Unspecified injury of head, initial encounter: Secondary | ICD-10-CM | POA: Diagnosis present

## 2015-10-02 DIAGNOSIS — S0083XA Contusion of other part of head, initial encounter: Secondary | ICD-10-CM | POA: Insufficient documentation

## 2015-10-02 DIAGNOSIS — W2209XA Striking against other stationary object, initial encounter: Secondary | ICD-10-CM | POA: Insufficient documentation

## 2015-10-02 HISTORY — DX: Anxiety disorder, unspecified: F41.9

## 2015-10-02 MED ORDER — DIAZEPAM 5 MG PO TABS
5.0000 mg | ORAL_TABLET | Freq: Once | ORAL | Status: AC
  Administered 2015-10-02: 5 mg via ORAL
  Filled 2015-10-02: qty 1

## 2015-10-02 MED ORDER — BUTALBITAL-APAP-CAFFEINE 50-325-40 MG PO TABS: 1.0000 | ORAL_TABLET | Freq: Four times a day (QID) | ORAL | Status: DC | PRN

## 2015-10-02 MED ORDER — KETOROLAC TROMETHAMINE 60 MG/2ML IM SOLN
60.0000 mg | Freq: Once | INTRAMUSCULAR | Status: AC
  Administered 2015-10-02: 60 mg via INTRAMUSCULAR
  Filled 2015-10-02: qty 2

## 2015-10-02 MED ORDER — DIAZEPAM 5 MG PO TABS: 5.0000 mg | ORAL_TABLET | Freq: Three times a day (TID) | ORAL | Status: AC | PRN

## 2015-10-02 NOTE — Discharge Instructions (Signed)
Facial or Scalp Contusion  A facial or scalp contusion is a deep bruise on the face or head. Contusions happen when an injury causes bleeding under the skin. Signs of bruising include pain, puffiness (swelling), and discolored skin. The contusion may turn blue, purple, or yellow. HOME CARE  Only take medicines as told by your doctor.  Put ice on the injured area.  Put ice in a plastic bag.  Place a towel between your skin and the bag.  Leave the ice on for 20 minutes, 2-3 times a day. GET HELP IF:  You have bite problems.  You have pain when chewing.  You are worried about your face not healing normally. GET HELP RIGHT AWAY IF:   You have severe pain or a headache and medicine does not help.  You are very tired or confused, or your personality changes.  You throw up (vomit).  You have a nosebleed that will not stop.  You see two of everything (double vision) or have blurry vision.  You have fluid coming from your nose or ear.  You have problems walking or using your arms or legs. MAKE SURE YOU:   Understand these instructions.  Will watch your condition.  Will get help right away if you are not doing well or get worse.   This information is not intended to replace advice given to you by your health care provider. Make sure you discuss any questions you have with your health care provider.   Document Released: 08/13/2011 Document Revised: 09/14/2014 Document Reviewed: 04/06/2013 Elsevier Interactive Patient Education 2016 [place] Injury, Adult You have a head injury. Headaches and throwing up (vomiting) are common after a head injury. It should be easy to wake up from sleeping. Sometimes you must stay in the hospital. Most problems happen within the first 24 hours. Side effects may occur up to 7-10 days after the injury.  WHAT ARE THE TYPES OF HEAD INJURIES? Head injuries can be as minor as a bump. Some head injuries can be more severe. More severe head  injuries include:  A jarring injury to the brain (concussion).  A bruise of the brain (contusion). This mean there is bleeding in the brain that can cause swelling.  A cracked skull (skull fracture).  Bleeding in the brain that collects, clots, and forms a bump (hematoma). WHEN SHOULD I GET HELP RIGHT AWAY?   You are confused or sleepy.  You cannot be woken up.  You feel sick to your stomach (nauseous) or keep throwing up (vomiting).  Your dizziness or unsteadiness is getting worse.  You have very bad, lasting headaches that are not helped by medicine. Take medicines only as told by your doctor.  You cannot use your arms or legs like normal.  You cannot walk.  You notice changes in the black spots in the center of the colored part of your eye (pupil).  You have clear or bloody fluid coming from your nose or ears.  You have trouble seeing. During the next 24 hours after the injury, you must stay with someone who can watch you. This person should get help right away (call 911 in the [place] if you start to shake and are not able to control it (have seizures), you pass out, or you are unable to wake up. HOW CAN I PREVENT A HEAD INJURY IN THE FUTURE?  Wear seat belts.  Wear a helmet while bike riding and playing sports like football.  Stay away from dangerous  activities around the house. WHEN CAN I RETURN TO NORMAL ACTIVITIES AND ATHLETICS? See your doctor before doing these activities. You should not do normal activities or play contact sports until 1 week after the following symptoms have stopped:  Headache that does not go away.  Dizziness.  Poor attention.  Confusion.  Memory problems.  Sickness to your stomach or throwing up.  Tiredness.  Fussiness.  Bothered by bright lights or loud noises.  Anxiousness or depression.  Restless sleep. MAKE SURE YOU:   Understand these instructions.  Will watch your condition.  Will get help right away if you are not  doing well or get worse.   This information is not intended to replace advice given to you by your health care provider. Make sure you discuss any questions you have with your health care provider.   Document Released: 08/06/2008 Document Revised: 09/14/2014 Document Reviewed: 05/01/2013 Elsevier Interactive Patient Education [employer].

## 2015-10-02 NOTE — ED Provider Notes (Signed)
[facility] Emergency Department Provider Note  ____________________________________________  Time seen: Approximately 3:04 PM  I have reviewed the triage vital signs and the nursing notes.   HISTORY  Chief Complaint Headache    HPI [name] is a 45 y.o. female who was at work today when she bent over to pick something up when she struck her forehead on a filing cabinet. Patient reports being extremely dizzy for approx. 25 mins after and continues to complain of having a headache. In addition, she has developed an uncontrolled tic or fasciulation of her right eye.Patient reports her tetanus is up-to-date at this time.   Past Medical History  Diagnosis Date  . Frequent headaches   . Allergy   . Hypothyroidism   . Anxiety     Patient Active Problem List   Diagnosis Date Noted  . Obesity (BMI 30-39.9) 09/21/2013  . Chronic headaches 09/21/2013  . Unspecified hypothyroidism 09/21/2013    Past Surgical History  Procedure Laterality Date  . Appendectomy  2013  . Tonsillectomy  2012  . Lapband  2013    Current Outpatient Rx  Name  Route  Sig  Dispense  Refill  . ALPRAZolam (XANAX) 0.25 MG tablet      TAKE 1 TABLET BY MOUTH TWICE A DAY AS NEEDED   20 tablet   0     Not to exceed 5 additional fills before 12/08/2015   . AMETHIA 0.15-0.03 &0.01 MG tablet   Oral   Take 1 tablet by mouth daily.   1 Package   0     Dispense as written.   . butalbital-acetaminophen-caffeine (FIORICET) 50-325-40 MG tablet   Oral   Take 1-2 tablets by mouth every 6 (six) hours as needed for headache.   20 tablet   0   . diazepam (VALIUM) 5 MG tablet   Oral   Take 1 tablet (5 mg total) by mouth every 8 (eight) hours as needed for anxiety.   21 tablet   0   . levocetirizine (XYZAL) 5 MG tablet   Oral   Take 1 tablet (5 mg total) by mouth every evening.   30 tablet   2   . levothyroxine (SYNTHROID, LEVOTHROID) 137 MCG tablet   Oral   Take 1  tablet (137 mcg total) by mouth daily before breakfast.   30 tablet   2   . metoprolol (LOPRESSOR) 50 MG tablet   Oral   Take 50 mg by mouth at bedtime.         . Prenatal Vit-Fe Fumarate-FA (PRENATAL MULTIVITAMIN) TABS tablet   Oral   Take 1 tablet by mouth daily at 12 noon.         . traZODone (DESYREL) 50 MG tablet   Oral   Take 0.5-1 tablets (25-50 mg total) by mouth at bedtime as needed for sleep.   30 tablet   0     Allergies Ciprofloxacin and Keflex  Family History  Problem Relation Age of Onset  . Cancer Mother   . Depression Mother   . Cancer Father   . Hyperlipidemia Father     Social History Social History  Substance Use Topics  . Smoking status: Never Smoker   . Smokeless tobacco: Never Used  . Alcohol Use: Yes     Comment: occasional    Review of Systems Constitutional: No fever/chills Eyes: Some blurred vision with right eye twitching. ENT: No sore throat. Cardiovascular: Denies chest pain. Respiratory: Denies shortness of breath.  Gastrointestinal: No abdominal pain.  No nausea, no vomiting.  No diarrhea.  No constipation. Genitourinary: Negative for dysuria. Musculoskeletal: Negative for back pain. Skin: Negative for rash. Neurological: Positive for headaches, denies focal weakness or numbness.  10-point ROS otherwise negative.  ____________________________________________   PHYSICAL EXAM:  VITAL SIGNS: ED Triage Vitals  Enc Vitals Group     BP 10/02/15 1422 143/81 mmHg     Pulse Rate 10/02/15 1422 85     Resp 10/02/15 1422 20     Temp 10/02/15 1422 98.2 F (36.8 C)     Temp Source 10/02/15 1422 Oral     SpO2 10/02/15 1422 100 %     Weight 10/02/15 1422 208 lb (94.348 kg)     Height 10/02/15 1422 5\' 5"  (1.651 m)     Head Cir --      Peak Flow --      Pain Score 10/02/15 1422 5     Pain Loc --      Pain Edu? --      Excl. in [place]? --     Constitutional: Alert and oriented. Well appearing and in no acute distress. Eyes:  Conjunctivae are normal. PERRL. EOMI. Fundoscopy difficult secondary to continuous twitching and eye movement. Head: Superficial laceration to anterior central forehead at the hair line approx 0.5cm. Bleeding controlled. Nose: No congestion/rhinnorhea. Mouth/Throat: Mucous membranes are moist.  Oropharynx non-erythematous. Neck: No stridor.   Cardiovascular: Normal rate, regular rhythm. Grossly normal heart sounds.  Good peripheral circulation. Respiratory: Normal respiratory effort.  No retractions. Lungs CTAB. Gastrointestinal: Soft and nontender. No distention. No abdominal bruits. No CVA tenderness. Musculoskeletal: No lower extremity tenderness nor edema.  No joint effusions. Neurologic:  Normal speech and language. No gross focal neurologic deficits are appreciated. No gait instability. Skin:  Skin is warm, dry and intact. No rash noted.  Psychiatric: Mood and affect are normal. Speech and behavior are normal.  ____________________________________________   LABS (all labs ordered are listed, but only abnormal results are displayed)  Labs Reviewed - No data to display ____________________________________________    PROCEDURES  Procedure(s) performed: None  Critical Care performed: No  ____________________________________________   INITIAL IMPRESSION / ASSESSMENT AND PLAN / ED COURSE  Pertinent labs & imaging results that were available during my care of the patient were reviewed by me and considered in my medical decision making (see chart for details).  Head CT negative for any acute bleed. Rx given for Fioricet as needed for headaches. Valium 5 mg every 8 hours as needed for anxiety and eye twitching. Patient follow-up with her PCP for further evaluation return to the ER with any worsening symptomology. ____________________________________________   FINAL CLINICAL IMPRESSION(S) / ED DIAGNOSES  Final diagnoses:  Head contusion, initial encounter     Arlyss Repress, PA-C 10/02/15 2159  Lisa Roca, MD 10/02/15 2215

## 2015-10-02 NOTE — ED Notes (Signed)
Pt knocked her head on the corner of a metal cabinet today at work, afterwhich she became light-headed, but did not pass out.  Since then she has had R eye twitching that is not going away.  Pt took ibuprofen 400mg  before coming to ED.

## 2015-10-02 NOTE — ED Notes (Signed)
Pt toe d with c/o headache after hitting her head on a cabinet at work today,  Small abrasion to forehead.  Pt denies loss of consciousness.

## 2015-10-03 ENCOUNTER — Other Ambulatory Visit: Payer: Self-pay | Admitting: Internal Medicine

## 2015-10-03 NOTE — Telephone Encounter (Signed)
Rx called in to pharmacy. 

## 2015-11-02 ENCOUNTER — Other Ambulatory Visit: Payer: Self-pay | Admitting: Internal Medicine

## 2015-11-07 ENCOUNTER — Other Ambulatory Visit (INDEPENDENT_AMBULATORY_CARE_PROVIDER_SITE_OTHER): Payer: BC Managed Care – PPO

## 2015-11-07 ENCOUNTER — Other Ambulatory Visit: Payer: Self-pay | Admitting: Internal Medicine

## 2015-11-07 ENCOUNTER — Encounter: Payer: BC Managed Care – PPO | Admitting: Internal Medicine

## 2015-11-07 DIAGNOSIS — Z0000 Encounter for general adult medical examination without abnormal findings: Secondary | ICD-10-CM | POA: Diagnosis not present

## 2015-11-07 DIAGNOSIS — E039 Hypothyroidism, unspecified: Secondary | ICD-10-CM

## 2015-11-07 LAB — COMPREHENSIVE METABOLIC PANEL
ALT: 9 U/L (ref 0–35)
AST: 13 U/L (ref 0–37)
Albumin: 4.2 g/dL (ref 3.5–5.2)
Alkaline Phosphatase: 59 U/L (ref 39–117)
BUN: 14 mg/dL (ref 6–23)
CO2: 30 mEq/L (ref 19–32)
Calcium: 9.2 mg/dL (ref 8.4–10.5)
Chloride: 102 mEq/L (ref 96–112)
Creatinine, Ser: 0.99 mg/dL (ref 0.40–1.20)
GFR: 64.92 mL/min (ref >60.00)
Glucose, Bld: 91 mg/dL (ref 70–99)
Potassium: 3.4 mEq/L — ABNORMAL LOW (ref 3.5–5.1)
Sodium: 137 mEq/L (ref 135–145)
Total Bilirubin: 0.4 mg/dL (ref 0.2–1.2)
Total Protein: 7 g/dL (ref 6.0–8.3)

## 2015-11-07 LAB — LIPID PANEL
CHOL/HDL RATIO: 5
Cholesterol: 213 mg/dL — ABNORMAL HIGH (ref 0–200)
HDL: 39.1 mg/dL (ref >39.00)
LDL Cholesterol: 155 mg/dL — ABNORMAL HIGH (ref 0–99)
NONHDL: 173.93
TRIGLYCERIDES: 97 mg/dL (ref 0.0–149.0)
VLDL: 19.4 mg/dL (ref 0.0–40.0)

## 2015-11-07 LAB — CBC
HEMATOCRIT: 35.2 % — AB (ref 36.0–46.0)
HEMOGLOBIN: 11.9 g/dL — AB (ref 12.0–15.0)
MCHC: 33.9 g/dL (ref 30.0–36.0)
MCV: 85.7 fl (ref 78.0–100.0)
Platelets: 281 10*3/uL (ref 150.0–400.0)
RBC: 4.11 Mil/uL (ref 3.87–5.11)
RDW: 13.8 % (ref 11.5–15.5)
WBC: 6.6 10*3/uL (ref 4.0–10.5)

## 2015-11-07 LAB — TSH: TSH: 0.74 u[IU]/mL (ref 0.35–4.50)

## 2015-11-07 LAB — HEMOGLOBIN A1C: Hgb A1c MFr Bld: 5.3 % (ref 4.6–6.5)

## 2015-11-07 LAB — T4, FREE: Free T4: 1.01 ng/dL (ref 0.60–1.60)

## 2015-11-13 ENCOUNTER — Ambulatory Visit (INDEPENDENT_AMBULATORY_CARE_PROVIDER_SITE_OTHER): Payer: BC Managed Care – PPO | Admitting: Internal Medicine

## 2015-11-13 ENCOUNTER — Encounter: Payer: Self-pay | Admitting: Internal Medicine

## 2015-11-13 VITALS — BP 140/80 | HR 86 | Temp 98.7°F | Ht 64.75 in | Wt 216.0 lb

## 2015-11-13 DIAGNOSIS — Z0000 Encounter for general adult medical examination without abnormal findings: Secondary | ICD-10-CM

## 2015-11-13 NOTE — Progress Notes (Signed)
Pre visit review using our clinic review tool, if applicable. No additional management support is needed unless otherwise documented below in the visit note. 

## 2015-11-13 NOTE — Patient Instructions (Signed)
Health Maintenance, Female Adopting a healthy lifestyle and getting preventive care can go a long way to promote health and wellness. Talk with your health care provider about what schedule of regular examinations is right for you. This is a good chance for you to check in with your provider about disease prevention and staying healthy. In between checkups, there are plenty of things you can do on your own. Experts have done a lot of research about which lifestyle changes and preventive measures are most likely to keep you healthy. Ask your health care provider for more information. WEIGHT AND DIET  Eat a healthy diet  Be sure to include plenty of vegetables, fruits, low-fat dairy products, and lean protein.  Do not eat a lot of foods high in solid fats, added sugars, or salt.  Get regular exercise. This is one of the most important things you can do for your health.  Most adults should exercise for at least 150 minutes each week. The exercise should increase your heart rate and make you sweat (moderate-intensity exercise).  Most adults should also do strengthening exercises at least twice a week. This is in addition to the moderate-intensity exercise.  Maintain a healthy weight  Body mass index (BMI) is a measurement that can be used to identify possible weight problems. It estimates body fat based on height and weight. Your health care provider can help determine your BMI and help you achieve or maintain a healthy weight.  For females 20 years of age and older:   A BMI below 18.5 is considered underweight.  A BMI of 18.5 to 24.9 is normal.  A BMI of 25 to 29.9 is considered overweight.  A BMI of 30 and above is considered obese.  Watch levels of cholesterol and blood lipids  You should start having your blood tested for lipids and cholesterol at 44 years of age, then have this test every 5 years.  You may need to have your cholesterol levels checked more often if:  Your lipid  or cholesterol levels are high.  You are older than 44 years of age.  You are at high risk for heart disease.  CANCER SCREENING   Lung Cancer  Lung cancer screening is recommended for adults 55-80 years old who are at high risk for lung cancer because of a history of smoking.  A yearly low-dose CT scan of the lungs is recommended for people who:  Currently smoke.  Have quit within the past 15 years.  Have at least a 30-pack-year history of smoking. A pack year is smoking an average of one pack of cigarettes a day for 1 year.  Yearly screening should continue until it has been 15 years since you quit.  Yearly screening should stop if you develop a health problem that would prevent you from having lung cancer treatment.  Breast Cancer  Practice breast self-awareness. This means understanding how your breasts normally appear and feel.  It also means doing regular breast self-exams. Let your health care provider know about any changes, no matter how small.  If you are in your 20s or 30s, you should have a clinical breast exam (CBE) by a health care provider every 1-3 years as part of a regular health exam.  If you are 40 or older, have a CBE every year. Also consider having a breast X-ray (mammogram) every year.  If you have a family history of breast cancer, talk to your health care provider about genetic screening.  If you   are at high risk for breast cancer, talk to your health care provider about having an MRI and a mammogram every year.  Breast cancer gene (BRCA) assessment is recommended for women who have family members with BRCA-related cancers. BRCA-related cancers include:  Breast.  Ovarian.  Tubal.  Peritoneal cancers.  Results of the assessment will determine the need for genetic counseling and BRCA1 and BRCA2 testing. Cervical Cancer Your health care provider may recommend that you be screened regularly for cancer of the pelvic organs (ovaries, uterus, and  vagina). This screening involves a pelvic examination, including checking for microscopic changes to the surface of your cervix (Pap test). You may be encouraged to have this screening done every 3 years, beginning at age 21.  For women ages 30-65, health care providers may recommend pelvic exams and Pap testing every 3 years, or they may recommend the Pap and pelvic exam, combined with testing for human papilloma virus (HPV), every 5 years. Some types of HPV increase your risk of cervical cancer. Testing for HPV may also be done on women of any age with unclear Pap test results.  Other health care providers may not recommend any screening for nonpregnant women who are considered low risk for pelvic cancer and who do not have symptoms. Ask your health care provider if a screening pelvic exam is right for you.  If you have had past treatment for cervical cancer or a condition that could lead to cancer, you need Pap tests and screening for cancer for at least 20 years after your treatment. If Pap tests have been discontinued, your risk factors (such as having a new sexual partner) need to be reassessed to determine if screening should resume. Some women have medical problems that increase the chance of getting cervical cancer. In these cases, your health care provider may recommend more frequent screening and Pap tests. Colorectal Cancer  This type of cancer can be detected and often prevented.  Routine colorectal cancer screening usually begins at 44 years of age and continues through 44 years of age.  Your health care provider may recommend screening at an earlier age if you have risk factors for colon cancer.  Your health care provider may also recommend using home test kits to check for hidden blood in the stool.  A small camera at the end of a tube can be used to examine your colon directly (sigmoidoscopy or colonoscopy). This is done to check for the earliest forms of colorectal  cancer.  Routine screening usually begins at age 50.  Direct examination of the colon should be repeated every 5-10 years through 44 years of age. However, you may need to be screened more often if early forms of precancerous polyps or small growths are found. Skin Cancer  Check your skin from head to toe regularly.  Tell your health care provider about any new moles or changes in moles, especially if there is a change in a mole's shape or color.  Also tell your health care provider if you have a mole that is larger than the size of a pencil eraser.  Always use sunscreen. Apply sunscreen liberally and repeatedly throughout the day.  Protect yourself by wearing long sleeves, pants, a wide-brimmed hat, and sunglasses whenever you are outside. HEART DISEASE, DIABETES, AND HIGH BLOOD PRESSURE   High blood pressure causes heart disease and increases the risk of stroke. High blood pressure is more likely to develop in:  People who have blood pressure in the high end   of the normal range (130-139/85-89 mm Hg).  People who are overweight or obese.  People who are African American.  If you are 38-23 years of age, have your blood pressure checked every 3-5 years. If you are 61 years of age or older, have your blood pressure checked every year. You should have your blood pressure measured twice--once when you are at a hospital or clinic, and once when you are not at a hospital or clinic. Record the average of the two measurements. To check your blood pressure when you are not at a hospital or clinic, you can use:  An automated blood pressure machine at a pharmacy.  A home blood pressure monitor.  If you are between 45 years and 39 years old, ask your health care provider if you should take aspirin to prevent strokes.  Have regular diabetes screenings. This involves taking a blood sample to check your fasting blood sugar level.  If you are at a normal weight and have a low risk for diabetes,  have this test once every three years after 44 years of age.  If you are overweight and have a high risk for diabetes, consider being tested at a younger age or more often. PREVENTING INFECTION  Hepatitis B  If you have a higher risk for hepatitis B, you should be screened for this virus. You are considered at high risk for hepatitis B if:  You were born in a country where hepatitis B is common. Ask your health care provider which countries are considered high risk.  Your parents were born in a high-risk country, and you have not been immunized against hepatitis B (hepatitis B vaccine).  You have HIV or AIDS.  You use needles to inject street drugs.  You live with someone who has hepatitis B.  You have had sex with someone who has hepatitis B.  You get hemodialysis treatment.  You take certain medicines for conditions, including cancer, organ transplantation, and autoimmune conditions. Hepatitis C  Blood testing is recommended for:  Everyone born from [birth year] through 1964.  Anyone with known risk factors for hepatitis C. Sexually transmitted infections (STIs)  You should be screened for sexually transmitted infections (STIs) including gonorrhea and chlamydia if:  You are sexually active and are younger than 44 years of age.  You are older than 44 years of age and your health care provider tells you that you are at risk for this type of infection.  Your sexual activity has changed since you were last screened and you are at an increased risk for chlamydia or gonorrhea. Ask your health care provider if you are at risk.  If you do not have HIV, but are at risk, it may be recommended that you take a prescription medicine daily to prevent HIV infection. This is called pre-exposure prophylaxis (PrEP). You are considered at risk if:  You are sexually active and do not regularly use condoms or know the HIV status of your partner(s).  You take drugs by injection.  You are sexually  active with a partner who has HIV. Talk with your health care provider about whether you are at high risk of being infected with HIV. If you choose to begin PrEP, you should first be tested for HIV. You should then be tested every 3 months for as long as you are taking PrEP.  PREGNANCY   If you are premenopausal and you may become pregnant, ask your health care provider about preconception counseling.  If you may  become pregnant, take 400 to 800 micrograms (mcg) of folic acid every day.  If you want to prevent pregnancy, talk to your health care provider about birth control (contraception). OSTEOPOROSIS AND MENOPAUSE   Osteoporosis is a disease in which the bones lose minerals and strength with aging. This can result in serious bone fractures. Your risk for osteoporosis can be identified using a bone density scan.  If you are 61 years of age or older, or if you are at risk for osteoporosis and fractures, ask your health care provider if you should be screened.  Ask your health care provider whether you should take a calcium or vitamin D supplement to lower your risk for osteoporosis.  Menopause may have certain physical symptoms and risks.  Hormone replacement therapy may reduce some of these symptoms and risks. Talk to your health care provider about whether hormone replacement therapy is right for you.  HOME CARE INSTRUCTIONS   Schedule regular health, dental, and eye exams.  Stay current with your immunizations.   Do not use any tobacco products including cigarettes, chewing tobacco, or electronic cigarettes.  If you are pregnant, do not drink alcohol.  If you are breastfeeding, limit how much and how often you drink alcohol.  Limit alcohol intake to no more than 1 drink per day for nonpregnant women. One drink equals 12 ounces of beer, 5 ounces of wine, or 1 ounces of hard liquor.  Do not use street drugs.  Do not share needles.  Ask your health care provider for help if  you need support or information about quitting drugs.  Tell your health care provider if you often feel depressed.  Tell your health care provider if you have ever been abused or do not feel safe at home.   This information is not intended to replace advice given to you by your health care provider. Make sure you discuss any questions you have with your health care provider.   Document Released: 03/09/2011 Document Revised: 09/14/2014 Document Reviewed: 07/26/2013 Elsevier Interactive Patient Education [employer].

## 2015-11-13 NOTE — Progress Notes (Signed)
Subjective:    Patient ID: [name], female    DOB: Dec 07, 1970, 45 y.o.   MRN: [id number]  HPI  Pt presents to the clinic today for her annual exam.  Flu: never Tetanus: 2010 Mammogram: 2013, she will call to schedule Pap Smear: 09/2015 at [facility] Screening: as needed Dentist: biannually  Diet: She does eat lean meat. She does not eat fruits, she does consume veggies daily. She does consume some fried foods. She drinks mostly water. Exercise: She is walking on a treadmill for 30 minutes 3 days a week.  Review of Systems      Past Medical History  Diagnosis Date  . Frequent headaches   . Allergy   . Hypothyroidism   . Anxiety     Current Outpatient Prescriptions  Medication Sig Dispense Refill  . ALPRAZolam (XANAX) 0.25 MG tablet TAKE 1 TABLET BY MOUTH TWICE A DAY AS NEEDED 20 tablet 0  . AMETHIA 0.15-0.03 &0.01 MG tablet Take 1 tablet by mouth daily. 1 Package 0  . butalbital-acetaminophen-caffeine (FIORICET) 50-325-40 MG tablet Take 1-2 tablets by mouth every 6 (six) hours as needed for headache. 20 tablet 0  . diazepam (VALIUM) 5 MG tablet Take 1 tablet (5 mg total) by mouth every 8 (eight) hours as needed for anxiety. 21 tablet 0  . levocetirizine (XYZAL) 5 MG tablet TAKE 1 TABLET (5 MG TOTAL) BY MOUTH EVERY EVENING. 30 tablet 2  . levothyroxine (SYNTHROID, LEVOTHROID) 137 MCG tablet Take 1 tablet (137 mcg total) by mouth daily before breakfast. 30 tablet 2  . metoprolol (LOPRESSOR) 50 MG tablet Take 50 mg by mouth at bedtime.    . Prenatal Vit-Fe Fumarate-FA (PRENATAL MULTIVITAMIN) TABS tablet Take 1 tablet by mouth daily at 12 noon.    . traZODone (DESYREL) 50 MG tablet Take 0.5-1 tablets (25-50 mg total) by mouth at bedtime as needed for sleep. 30 tablet 0   No current facility-administered medications for this visit.    Allergies  Allergen Reactions  . Ciprofloxacin   . Keflex [Cephalexin] Hives    Family History  Problem  Relation Age of Onset  . Cancer Mother   . Depression Mother   . Cancer Father   . Hyperlipidemia Father     Social History   Social History  . Marital Status: Single    Spouse Name: N/A  . Number of Children: N/A  . Years of Education: N/A   Occupational History  . Not on file.   Social History Main Topics  . Smoking status: Never Smoker   . Smokeless tobacco: Never Used  . Alcohol Use: Yes     Comment: occasional  . Drug Use: No  . Sexual Activity:    Partners: Male   Other Topics Concern  . Not on file   Social History Narrative     Constitutional: Denies fever, malaise, fatigue, headache or abrupt weight changes.  HEENT: Denies eye pain, eye redness, ear pain, ringing in the ears, wax buildup, runny nose, nasal congestion, bloody nose, or sore throat. Respiratory: Denies difficulty breathing, shortness of breath, cough or sputum production.   Cardiovascular: Denies chest pain, chest tightness, palpitations or swelling in the hands or feet.  Gastrointestinal: Denies abdominal pain, bloating, constipation, diarrhea or blood in the stool.  GU: Denies urgency, frequency, pain with urination, burning sensation, blood in urine, odor or discharge. Musculoskeletal: Denies decrease in range of motion, difficulty with gait, muscle pain or joint pain and swelling.  Skin:  Denies redness, rashes, lesions or ulcercations.  Neurological: Denies dizziness, difficulty with memory, difficulty with speech or problems with balance and coordination.  Psych: Pt has anxiety. Denies depression, SI/HI.  No other specific complaints in a complete review of systems (except as listed in HPI above).  Objective:   Physical Exam  BP 140/80 mmHg  Pulse 86  Temp(Src) 98.7 F (37.1 C) (Oral)  Ht 5' 4.75" (1.645 m)  Wt 216 lb (97.977 kg)  BMI 36.21 kg/m2  SpO2 99% Wt Readings from Last 3 Encounters:  11/13/15 216 lb (97.977 kg)  10/02/15 208 lb (94.348 kg)  08/05/15 211 lb 8 oz (95.936  kg)    General: Appears her stated age, obese in NAD. Skin: Warm, dry and intact.  HEENT: Head: normal shape and size; Eyes: sclera white, no icterus, conjunctiva pink, PERRLA and EOMs intact; Ears: Tm's gray and intact, normal light reflex; Throat/Mouth: Teeth present, mucosa pink and moist, no exudate, lesions or ulcerations noted.  Neck:  Neck supple, trachea midline. No masses, lumps or thyromegaly present.  Cardiovascular: Normal rate and rhythm. S1,S2 noted.  No murmur, rubs or gallops noted.  Pulmonary/Chest: Normal effort and positive vesicular breath sounds. No respiratory distress. No wheezes, rales or ronchi noted.  Abdomen: Soft and nontender. Normal bowel sounds. No distention or masses noted. Liver, spleen and kidneys non palpable. Musculoskeletal: Strength 5/5 BUE/BLE. No signs of joint swelling. No difficulty with gait.  Neurological: Alert and oriented. Cranial nerves II-XII grossly intact. Coordination normal.  Psychiatric: Mood and affect normal. Behavior is normal. Judgment and thought content normal.     BMET    Component Value Date/Time   NA 137 11/07/2015 1458   K 3.4* 11/07/2015 1458   CL 102 11/07/2015 1458   CO2 30 11/07/2015 1458   GLUCOSE 91 11/07/2015 1458   BUN 14 11/07/2015 1458   CREATININE 0.99 11/07/2015 1458   CALCIUM 9.2 11/07/2015 1458    Lipid Panel     Component Value Date/Time   CHOL 213* 11/07/2015 1458   TRIG 97.0 11/07/2015 1458   HDL 39.10 11/07/2015 1458   CHOLHDL 5 11/07/2015 1458   VLDL 19.4 11/07/2015 1458   LDLCALC 155* 11/07/2015 1458    CBC    Component Value Date/Time   WBC 6.6 11/07/2015 1458   RBC 4.11 11/07/2015 1458   HGB 11.9* 11/07/2015 1458   HCT 35.2* 11/07/2015 1458   PLT 281.0 11/07/2015 1458   MCV 85.7 11/07/2015 1458   MCHC 33.9 11/07/2015 1458   RDW 13.8 11/07/2015 1458    Hgb A1C Lab Results  Component Value Date   HGBA1C 5.3 11/07/2015         Assessment & Plan:   Preventative Health  Maintenance:  She declines flu shot Tetanus UTD Advised her to call and schedule her mammogram Pap smear UTD Encouraged her to consume a balanced diet and start an exercise regimen Encouraged her to see an eye doctor and dentist at least annually She reports she had labs 04/2015 at [facility], will request a copy  RTC in 6 months to follow up chronic conditions

## 2015-11-28 ENCOUNTER — Other Ambulatory Visit: Payer: Self-pay | Admitting: Internal Medicine

## 2015-11-29 NOTE — Telephone Encounter (Signed)
Rx called in to pharmacy. 

## 2015-11-29 NOTE — Telephone Encounter (Signed)
Last filled 10/01/15---UDS up to date next due in May--please advise

## 2015-11-29 NOTE — Telephone Encounter (Signed)
Ok to phone in Xanax 

## 2015-12-27 ENCOUNTER — Ambulatory Visit (INDEPENDENT_AMBULATORY_CARE_PROVIDER_SITE_OTHER): Payer: BC Managed Care – PPO | Admitting: Family Medicine

## 2015-12-27 ENCOUNTER — Encounter: Payer: Self-pay | Admitting: Family Medicine

## 2015-12-27 VITALS — BP 138/80 | HR 88 | Temp 97.9°F | Wt 220.0 lb

## 2015-12-27 DIAGNOSIS — R05 Cough: Secondary | ICD-10-CM

## 2015-12-27 DIAGNOSIS — R059 Cough, unspecified: Secondary | ICD-10-CM

## 2015-12-27 MED ORDER — IBUPROFEN 200 MG PO TABS: 600.0000 mg | ORAL_TABLET | Freq: Three times a day (TID) | ORAL | Status: DC | PRN

## 2015-12-27 MED ORDER — BENZONATATE 200 MG PO CAPS: 200.0000 mg | ORAL_CAPSULE | Freq: Three times a day (TID) | ORAL | Status: DC | PRN

## 2015-12-27 NOTE — Patient Instructions (Signed)
Ibuprofen with food as needed.  Continue xyzal in the meantime.  Tessalon for cough.  Rest and fluids.  You should gradually get better. Take care.  Glad to see you.

## 2015-12-27 NOTE — Progress Notes (Signed)
Pre visit review using our clinic review tool, if applicable. No additional management support is needed unless otherwise documented below in the visit note.  Sx started about 6 days ago.  H/o sinus congestion and allergies.  Now with cough.  No fevers.  Back sore from cough, pain with deep breath across the upper back.  Tessalon help the cough.  No rash.  No sputum usually but then last night with some clear sputum.  She has mild seasonal allergies usually.  No wheeze.  She can get tired, but isn't SOB.    Meds, vitals, and allergies reviewed.   ROS: See HPI.  Otherwise, noncontributory.  nad Mildly stuffy nasal exam TM wnl OP wnl Neck supple, no LA No UAN rrr Ctab, no WRR, no inc wob Upper back ttp B

## 2015-12-29 DIAGNOSIS — R05 Cough: Secondary | ICD-10-CM | POA: Insufficient documentation

## 2015-12-29 DIAGNOSIS — R059 Cough, unspecified: Secondary | ICD-10-CM | POA: Insufficient documentation

## 2015-12-29 NOTE — Assessment & Plan Note (Signed)
Likely either allergies or mild viral illness.  Nontoxic, d/w pt.  Ibuprofen with food as needed.  Continue xyzal in the meantime.  Tessalon for cough.  Rest and fluids.  She should gradually get better.  F/u prn.  No indication for abx at this point.

## 2015-12-30 ENCOUNTER — Other Ambulatory Visit: Payer: Self-pay | Admitting: Internal Medicine

## 2016-01-08 ENCOUNTER — Other Ambulatory Visit: Payer: Self-pay

## 2016-01-08 MED ORDER — BUTALBITAL-APAP-CAFFEINE 50-325-40 MG PO TABS: 1.0000 | ORAL_TABLET | Freq: Four times a day (QID) | ORAL | Status: DC | PRN

## 2016-01-08 NOTE — Telephone Encounter (Signed)
Pt left /vm requesting refill fioricet to [facility]. Last printed # 20 on 10/02/15 and last annual exam on 11/13/15.

## 2016-01-08 NOTE — Telephone Encounter (Signed)
Rx called in to pharmacy. 

## 2016-01-08 NOTE — Telephone Encounter (Signed)
Ok to phone in Fioricet 

## 2016-01-15 ENCOUNTER — Other Ambulatory Visit: Payer: Self-pay | Admitting: Internal Medicine

## 2016-01-15 NOTE — Telephone Encounter (Signed)
Last filled 11/29/2015--please advise

## 2016-01-15 NOTE — Telephone Encounter (Signed)
Ok to phone in Xanax 

## 2016-01-17 NOTE — Telephone Encounter (Signed)
Rx called in to pharmacy. 

## 2016-02-22 ENCOUNTER — Other Ambulatory Visit: Payer: Self-pay | Admitting: Internal Medicine

## 2016-02-23 ENCOUNTER — Other Ambulatory Visit: Payer: Self-pay | Admitting: Internal Medicine

## 2016-02-24 NOTE — Telephone Encounter (Signed)
Ok to phone in Xanax 

## 2016-02-24 NOTE — Telephone Encounter (Signed)
Last filled 01/15/16--please advise---will ask that pt come in to renew CSA before next refill if okay with you

## 2016-02-25 NOTE — Telephone Encounter (Signed)
Rx called in to pharmacy. 

## 2016-03-31 ENCOUNTER — Other Ambulatory Visit: Payer: Self-pay

## 2016-03-31 MED ORDER — BUTALBITAL-APAP-CAFFEINE 50-325-40 MG PO TABS: 1.0000 | ORAL_TABLET | Freq: Four times a day (QID) | ORAL | 0 refills | Status: DC | PRN

## 2016-03-31 NOTE — Telephone Encounter (Signed)
Last filled 01/08/16--please advise

## 2016-04-01 NOTE — Telephone Encounter (Signed)
Rx called in to pharmacy. 

## 2016-04-09 ENCOUNTER — Other Ambulatory Visit: Payer: Self-pay | Admitting: Internal Medicine

## 2016-04-10 ENCOUNTER — Telehealth: Payer: Self-pay | Admitting: Internal Medicine

## 2016-04-10 ENCOUNTER — Ambulatory Visit (INDEPENDENT_AMBULATORY_CARE_PROVIDER_SITE_OTHER): Payer: BC Managed Care – PPO | Admitting: Internal Medicine

## 2016-04-10 ENCOUNTER — Other Ambulatory Visit: Payer: Self-pay | Admitting: Internal Medicine

## 2016-04-10 ENCOUNTER — Encounter: Payer: Self-pay | Admitting: Internal Medicine

## 2016-04-10 VITALS — BP 146/88 | HR 76 | Temp 98.6°F | Wt 205.8 lb

## 2016-04-10 DIAGNOSIS — Z658 Other specified problems related to psychosocial circumstances: Secondary | ICD-10-CM

## 2016-04-10 DIAGNOSIS — IMO0001 Reserved for inherently not codable concepts without codable children: Secondary | ICD-10-CM

## 2016-04-10 DIAGNOSIS — F439 Reaction to severe stress, unspecified: Secondary | ICD-10-CM

## 2016-04-10 DIAGNOSIS — R030 Elevated blood-pressure reading, without diagnosis of hypertension: Secondary | ICD-10-CM | POA: Diagnosis not present

## 2016-04-10 MED ORDER — ALPRAZOLAM 0.25 MG PO TABS: 0.2500 mg | ORAL_TABLET | Freq: Two times a day (BID) | ORAL | 0 refills | Status: DC | PRN

## 2016-04-10 MED ORDER — SERTRALINE HCL 50 MG PO TABS: ORAL_TABLET | ORAL | 3 refills | Status: DC

## 2016-04-10 NOTE — Telephone Encounter (Signed)
Pt has appt with Avie Echevaria NP on 04/10/16 at 11:45.

## 2016-04-10 NOTE — Patient Instructions (Signed)
Stress and Stress Management Stress is a normal reaction to life events. It is what you feel when life demands more than you are used to or more than you can handle. Some stress can be useful. For example, the stress reaction can help you catch the last bus of the day, study for a test, or meet a deadline at work. But stress that occurs too often or for too long can cause problems. It can affect your emotional health and interfere with relationships and normal daily activities. Too much stress can weaken your immune system and increase your risk for physical illness. If you already have a medical problem, stress can make it worse. CAUSES  All sorts of life events may cause stress. An event that causes stress for one person may not be stressful for another person. Major life events commonly cause stress. These may be positive or negative. Examples include losing your job, moving into a new home, getting married, having a baby, or losing a loved one. Less obvious life events may also cause stress, especially if they occur day after day or in combination. Examples include working long hours, driving in traffic, caring for children, being in debt, or being in a difficult relationship. SIGNS AND SYMPTOMS Stress may cause emotional symptoms including, the following:  Anxiety. This is feeling worried, afraid, on edge, overwhelmed, or out of control.  Anger. This is feeling irritated or impatient.  Depression. This is feeling sad, down, helpless, or guilty.  Difficulty focusing, remembering, or making decisions. Stress may cause physical symptoms, including the following:   Aches and pains. These may affect your head, neck, back, stomach, or other areas of your body.  Tight muscles or clenched jaw.  Low energy or trouble sleeping. Stress may cause unhealthy behaviors, including the following:   Eating to feel better (overeating) or skipping meals.  Sleeping too little, too much, or both.  Working  too much or putting off tasks (procrastination).  Smoking, drinking alcohol, or using drugs to feel better. DIAGNOSIS  Stress is diagnosed through an assessment by your health care provider. Your health care provider will ask questions about your symptoms and any stressful life events.Your health care provider will also ask about your medical history and may order blood tests or other tests. Certain medical conditions and medicine can cause physical symptoms similar to stress. Mental illness can cause emotional symptoms and unhealthy behaviors similar to stress. Your health care provider may refer you to a mental health professional for further evaluation.  TREATMENT  Stress management is the recommended treatment for stress.The goals of stress management are reducing stressful life events and coping with stress in healthy ways.  Techniques for reducing stressful life events include the following:  Stress identification. Self-monitor for stress and identify what causes stress for you. These skills may help you to avoid some stressful events.  Time management. Set your priorities, keep a calendar of events, and learn to say "no." These tools can help you avoid making too many commitments. Techniques for coping with stress include the following:  Rethinking the problem. Try to think realistically about stressful events rather than ignoring them or overreacting. Try to find the positives in a stressful situation rather than focusing on the negatives.  Exercise. Physical exercise can release both physical and emotional tension. The key is to find a form of exercise you enjoy and do it regularly.  Relaxation techniques. These relax the body and mind. Examples include yoga, meditation, tai chi, biofeedback, deep  breathing, progressive muscle relaxation, listening to music, being out in nature, journaling, and other hobbies. Again, the key is to find one or more that you enjoy and can do  regularly.  Healthy lifestyle. Eat a balanced diet, get plenty of sleep, and do not smoke. Avoid using alcohol or drugs to relax.  Strong support network. Spend time with family, friends, or other people you enjoy being around.Express your feelings and talk things over with someone you trust. Counseling or talktherapy with a mental health professional may be helpful if you are having difficulty managing stress on your own. Medicine is typically not recommended for the treatment of stress.Talk to your health care provider if you think you need medicine for symptoms of stress. HOME CARE INSTRUCTIONS  Keep all follow-up visits as directed by your health care provider.  Take all medicines as directed by your health care provider. SEEK MEDICAL CARE IF:  Your symptoms get worse or you start having new symptoms.  You feel overwhelmed by your problems and can no longer manage them on your own. SEEK IMMEDIATE MEDICAL CARE IF:  You feel like hurting yourself or someone else.   This information is not intended to replace advice given to you by your health care provider. Make sure you discuss any questions you have with your health care provider.   Document Released: 02/17/2001 Document Revised: 09/14/2014 Document Reviewed: 04/18/2013 Elsevier Interactive Patient Education 2016 [employer] Inc[employer]  

## 2016-04-10 NOTE — Telephone Encounter (Signed)
Rx called in to pharmacy. 

## 2016-04-10 NOTE — Telephone Encounter (Signed)
Last filled 02/25/16--pt will be due for 6 mth f/u next month will call pt and remind her so we can UDS at [facility]--please advise

## 2016-04-10 NOTE — Telephone Encounter (Signed)
Ok to phone in Xanax. When you send RX request that need to be phoned in, would you mind changing the class to phone in before you send it?

## 2016-04-10 NOTE — Telephone Encounter (Signed)
Will address at [facility] today.

## 2016-04-10 NOTE — Progress Notes (Signed)
Pre visit review using our clinic review tool, if applicable. No additional management support is needed unless otherwise documented below in the visit note. 

## 2016-04-10 NOTE — Telephone Encounter (Signed)
Patient Name: [name]  DOB: May 18, 1971    Initial Comment Caller had BP checked and it was 160/108, having headaches    Nurse Assessment  Nurse: Mallie Mussel, RN, Alveta Heimlich Date/Time (Eastern Time): 04/10/2016 9:26:49 AM  Confirm and document reason for call. If symptomatic, describe symptoms. You must click the next button to save text entered. ---Caller states that her BP was 160/108 this morning at a different doctor's office. She cannot check it at this time. She has had a non-constant headache for the past 2 weeks. Denies headache at this time. Denies unilateral weakness and numbness. She is not on any medication for her BP.  Has the patient traveled out of the country within the last 30 days? ---No  Does the patient have any new or worsening symptoms? ---Yes  Will a triage be completed? ---Yes  Related visit to physician within the last 2 weeks? ---No  Does the PT have any chronic conditions? (i.e. diabetes, asthma, etc.) ---Yes  List chronic conditions. ---Hypothyroidism, Anxiety,  Is the patient pregnant or possibly pregnant? (Ask all females between the ages of 11-55) ---No  Is this a behavioral health or substance abuse call? ---No     Guidelines    Guideline Title Affirmed Question Affirmed Notes  High Blood Pressure BP ? 160/100    Final Disposition User   See PCP When Office is Open (within 3 days) Mallie Mussel, RN, Alveta Heimlich    Comments  Appointment scheduled for today at 4:15pm with Webb Silversmith NP   Referrals  REFERRED TO PCP OFFICE   Disagree/Comply: Comply

## 2016-04-10 NOTE — Progress Notes (Signed)
Subjective:    Patient ID: [name], female    DOB: February 18, 1971, 45 y.o.   MRN: [id number]  HPI  Pt presents to the clinic today with c/o elevated blood pressure. She reports she was at another doctors office this morning, her blood pressure was 160/108. She is concerned because she has had a constant headache for 2 weeks, which is improved right now. The headaches are located in her forehead. She describes the pain as tight and pressure. She denies visual changes or dizziness. Her blood pressures usually run 120-140's/76-94. She has never been treated for high blood pressure in the past. She is very stressed. She is interested in treatment for her stress.   Review of Systems  Past Medical History:  Diagnosis Date  . Allergy   . Anxiety   . Frequent headaches   . Hypothyroidism     Current Outpatient Prescriptions  Medication Sig Dispense Refill  . ALPRAZolam (XANAX) 0.25 MG tablet TAKE 1 TABLET BY MOUTH TWICE DAILY AS NEEDED 20 tablet 0  . AMETHIA 0.15-0.03 &0.01 MG tablet Take 1 tablet by mouth daily. 1 Package 0  . benzonatate (TESSALON) 200 MG capsule Take 1 capsule (200 mg total) by mouth 3 (three) times daily as needed for cough. 30 capsule 1  . butalbital-acetaminophen-caffeine (FIORICET) 50-325-40 MG tablet Take 1-2 tablets by mouth every 6 (six) hours as needed for headache. 20 tablet 0  . diazepam (VALIUM) 5 MG tablet Take 1 tablet (5 mg total) by mouth every 8 (eight) hours as needed for anxiety. 21 tablet 0  . ibuprofen (ADVIL) 200 MG tablet Take 3 tablets (600 mg total) by mouth every 8 (eight) hours as needed (with food).    [name] levocetirizine (XYZAL) 5 MG tablet TAKE 1 TABLET (5 MG TOTAL) BY MOUTH EVERY EVENING. 30 tablet 2  . levothyroxine (SYNTHROID, LEVOTHROID) 137 MCG tablet TAKE 1 TABLET (137 MCG TOTAL) BY MOUTH DAILY BEFORE BREAKFAST. 30 tablet 5  . Prenatal Vit-Fe Fumarate-FA (PRENATAL MULTIVITAMIN) TABS tablet Take 1 tablet by mouth daily at 12 noon.      No current facility-administered medications for this visit.     Allergies  Allergen Reactions  . Ciprofloxacin     anxiety  . Keflex [Cephalexin] Hives  . Trazodone And Nefazodone Other (See Comments)    nightmares    Family History  Problem Relation Age of Onset  . Cancer Mother   . Depression Mother   . Cancer Father   . Hyperlipidemia Father     Social History   Social History  . Marital status: Single    Spouse name: N/A  . Number of children: N/A  . Years of education: N/A   Occupational History  . Not on file.   Social History Main Topics  . Smoking status: Never Smoker  . Smokeless tobacco: Never Used  . Alcohol use Yes     Comment: occasional  . Drug use: No  . Sexual activity: Yes    Partners: Male   Other Topics Concern  . Not on file   Social History Narrative  . No narrative on file     Constitutional: Pt reports headaches. Denies fever, malaise, fatigue, or abrupt weight changes.  HEENT: Denies eye pain, eye redness, ear pain, ringing in the ears, wax buildup, runny nose, nasal congestion, bloody nose, or sore throat. Respiratory: Denies difficulty breathing, shortness of breath, cough or sputum production.   Cardiovascular: Denies chest pain, chest tightness, palpitations or swelling in the  hands or feet.  Neurological: Denies dizziness, difficulty with memory, difficulty with speech or problems with balance and coordination.  Psych: Pt reports stress. Denies depression, SI/HI.  No other specific complaints in a complete review of systems (except as listed in HPI above).     Objective:   Physical Exam   BP (!) 146/88   Pulse 76   Temp 98.6 F (37 C) (Oral)   Wt 205 lb 12 oz (93.3 kg)   SpO2 98%   BMI 34.50 kg/m  Wt Readings from Last 3 Encounters:  04/10/16 205 lb 12 oz (93.3 kg)  12/27/15 220 lb (99.8 kg)  11/13/15 216 lb (98 kg)    General: Appears her stated age, obese in NAD. HEENT: Head: normal shape and size; Eyes:  sclera white, no icterus, conjunctiva pink, PERRLA and EOMs intact;  Cardiovascular: Normal rate and rhythm. S1,S2 noted.  No murmur, rubs or gallops noted.  Pulmonary/Chest: Normal effort and positive vesicular breath sounds. No respiratory distress. No wheezes, rales or ronchi noted.  Neurological: Alert and oriented.  Psychiatric: Mood and affect flat.  BMET    Component Value Date/Time   NA 137 11/07/2015 1458   K 3.4 (L) 11/07/2015 1458   CL 102 11/07/2015 1458   CO2 30 11/07/2015 1458   GLUCOSE 91 11/07/2015 1458   BUN 14 11/07/2015 1458   CREATININE 0.99 11/07/2015 1458   CALCIUM 9.2 11/07/2015 1458    Lipid Panel     Component Value Date/Time   CHOL 213 (H) 11/07/2015 1458   TRIG 97.0 11/07/2015 1458   HDL 39.10 11/07/2015 1458   CHOLHDL 5 11/07/2015 1458   VLDL 19.4 11/07/2015 1458   LDLCALC 155 (H) 11/07/2015 1458    CBC    Component Value Date/Time   WBC 6.6 11/07/2015 1458   RBC 4.11 11/07/2015 1458   HGB 11.9 (L) 11/07/2015 1458   HCT 35.2 (L) 11/07/2015 1458   PLT 281.0 11/07/2015 1458   MCV 85.7 11/07/2015 1458   MCHC 33.9 11/07/2015 1458   RDW 13.8 11/07/2015 1458    Hgb A1C Lab Results  Component Value Date   HGBA1C 5.3 11/07/2015           Assessment & Plan:   Elevated blood pressure, likely due to stress:  No need for antihypertensives at this time Discussed stress management techniques Continue Xanax eRx for Zoloft 50 mg QHS  Update me in 4 weeks and let he know how you are doing Webb Silversmith, NP

## 2016-04-10 NOTE — Addendum Note (Signed)
Addended by: Lurlean Nanny on: 04/10/2016 04:17 PM   Modules accepted: Orders

## 2016-06-01 ENCOUNTER — Other Ambulatory Visit: Payer: Self-pay | Admitting: Internal Medicine

## 2016-06-03 ENCOUNTER — Other Ambulatory Visit: Payer: Self-pay | Admitting: Internal Medicine

## 2016-06-03 NOTE — Telephone Encounter (Signed)
Ok to phone in Xanax 

## 2016-06-03 NOTE — Telephone Encounter (Signed)
Xanax last filled 04/10/16---please advise

## 2016-06-04 NOTE — Telephone Encounter (Signed)
Rx called in to pharmacy. 

## 2016-06-16 ENCOUNTER — Ambulatory Visit (INDEPENDENT_AMBULATORY_CARE_PROVIDER_SITE_OTHER): Payer: BC Managed Care – PPO | Admitting: Primary Care

## 2016-06-16 ENCOUNTER — Encounter: Payer: Self-pay | Admitting: Primary Care

## 2016-06-16 VITALS — BP 144/96 | HR 83 | Temp 98.1°F | Ht 64.75 in | Wt 196.1 lb

## 2016-06-16 DIAGNOSIS — J069 Acute upper respiratory infection, unspecified: Secondary | ICD-10-CM

## 2016-06-16 DIAGNOSIS — B9789 Other viral agents as the cause of diseases classified elsewhere: Secondary | ICD-10-CM | POA: Diagnosis not present

## 2016-06-16 NOTE — Patient Instructions (Addendum)
Your symptoms are representative of a viral illness which will resolve on its own over time. Our goal is to treat your symptoms in order to aid your body in the healing process and to make you more comfortable.   Continue Xyzal and Benzonatate capsules as discussed.   Please notify me if you develop persistent fevers of 101, start coughing up green mucous, notice increased fatigue or weakness, or feel worse Friday this week  Increase consumption of water intake and rest.   It was a pleasure meeting you!   Upper Respiratory Infection, Adult Most upper respiratory infections (URIs) are a viral infection of the air passages leading to the lungs. A URI affects the nose, throat, and upper air passages. The most common type of URI is nasopharyngitis and is typically referred to as "the common cold." URIs run their course and usually go away on their own. Most of the time, a URI does not require medical attention, but sometimes a bacterial infection in the upper airways can follow a viral infection. This is called a secondary infection. Sinus and middle ear infections are common types of secondary upper respiratory infections. Bacterial pneumonia can also complicate a URI. A URI can worsen asthma and chronic obstructive pulmonary disease (COPD). Sometimes, these complications can require emergency medical care and may be life threatening.  CAUSES Almost all URIs are caused by viruses. A virus is a type of germ and can spread from one person to another.  RISKS FACTORS You may be at risk for a URI if:   You smoke.   You have chronic heart or lung disease.  You have a weakened defense (immune) system.   You are very young or very old.   You have nasal allergies or asthma.  You work in crowded or poorly ventilated areas.  You work in health care facilities or schools. SIGNS AND SYMPTOMS  Symptoms typically develop 2-3 days after you come in contact with a cold virus. Most viral URIs last  7-10 days. However, viral URIs from the influenza virus (flu virus) can last 14-18 days and are typically more severe. Symptoms may include:   Runny or stuffy (congested) nose.   Sneezing.   Cough.   Sore throat.   Headache.   Fatigue.   Fever.   Loss of appetite.   Pain in your forehead, behind your eyes, and over your cheekbones (sinus pain).  Muscle aches.  DIAGNOSIS  Your health care provider may diagnose a URI by:  Physical exam.  Tests to check that your symptoms are not due to another condition such as:  Strep throat.  Sinusitis.  Pneumonia.  Asthma. TREATMENT  A URI goes away on its own with time. It cannot be cured with medicines, but medicines may be prescribed or recommended to relieve symptoms. Medicines may help:  Reduce your fever.  Reduce your cough.  Relieve nasal congestion. HOME CARE INSTRUCTIONS   Take medicines only as directed by your health care provider.   Gargle warm saltwater or take cough drops to comfort your throat as directed by your health care provider.  Use a warm mist humidifier or inhale steam from a shower to increase air moisture. This may make it easier to breathe.  Drink enough fluid to keep your urine clear or pale yellow.   Eat soups and other clear broths and maintain good nutrition.   Rest as needed.   Return to work when your temperature has returned to normal or as your health care  provider advises. You may need to stay home longer to avoid infecting others. You can also use a face mask and careful hand washing to prevent spread of the virus.  Increase the usage of your inhaler if you have asthma.   Do not use any tobacco products, including cigarettes, chewing tobacco, or electronic cigarettes. If you need help quitting, ask your health care provider. PREVENTION  The best way to protect yourself from getting a cold is to practice good hygiene.   Avoid oral or hand contact with people with cold  symptoms.   Wash your hands often if contact occurs.  There is no clear evidence that vitamin C, vitamin E, echinacea, or exercise reduces the chance of developing a cold. However, it is always recommended to get plenty of rest, exercise, and practice good nutrition.  SEEK MEDICAL CARE IF:   You are getting worse rather than better.   Your symptoms are not controlled by medicine.   You have chills.  You have worsening shortness of breath.  You have brown or red mucus.  You have yellow or brown nasal discharge.  You have pain in your face, especially when you bend forward.  You have a fever.  You have swollen neck glands.  You have pain while swallowing.  You have white areas in the back of your throat. SEEK IMMEDIATE MEDICAL CARE IF:   You have severe or persistent:  Headache.  Ear pain.  Sinus pain.  Chest pain.  You have chronic lung disease and any of the following:  Wheezing.  Prolonged cough.  Coughing up blood.  A change in your usual mucus.  You have a stiff neck.  You have changes in your:  Vision.  Hearing.  Thinking.  Mood. MAKE SURE YOU:   Understand these instructions.  Will watch your condition.  Will get help right away if you are not doing well or get worse.   This information is not intended to replace advice given to you by your health care provider. Make sure you discuss any questions you have with your health care provider.   Document Released: 02/17/2001 Document Revised: 01/08/2015 Document Reviewed: 11/29/2013 Elsevier Interactive Patient Education [employer].

## 2016-06-16 NOTE — Progress Notes (Signed)
Subjective:    Patient ID: [name], female    DOB: 1970-09-29, 45 y.o.   MRN: [id number]  HPI  [name] is a 45 year old female who presents to[place] with a chief complaint of sore throat. She also reports cough, hoarseness to her voice, discomfort to her tongue. Her symptoms have been present for the past 5 days. She's taken Alka-Selzer cough and cold, benadryl, and xyzal without much improvement. Her cough is non-productive. Denies fevers, sick contacts, shortness of breath.  Review of Systems  Constitutional: Positive for fatigue. Negative for chills and fever.  HENT: Positive for sore throat and voice change. Negative for congestion, postnasal drip and sinus pressure.   Respiratory: Positive for cough. Negative for shortness of breath and wheezing.        Past Medical History:  Diagnosis Date  . Allergy   . Anxiety   . Frequent headaches   . Hypothyroidism      Social History   Social History  . Marital status: Single    Spouse name: N/A  . Number of children: N/A  . Years of education: N/A   Occupational History  . Not on file.   Social History Main Topics  . Smoking status: Never Smoker  . Smokeless tobacco: Never Used  . Alcohol use Yes     Comment: occasional  . Drug use: No  . Sexual activity: Yes    Partners: Male   Other Topics Concern  . Not on file   Social History Narrative  . No narrative on file    Past Surgical History:  Procedure Laterality Date  . APPENDECTOMY  2013  . lapband  2013  . TONSILLECTOMY  2012    Family History  Problem Relation Age of Onset  . Cancer Mother   . Depression Mother   . Cancer Father   . Hyperlipidemia Father     Allergies  Allergen Reactions  . Ciprofloxacin     anxiety  . Keflex [Cephalexin] Hives  . Trazodone And Nefazodone Other (See Comments)    nightmares    Current Outpatient Prescriptions on File Prior to Visit  Medication Sig Dispense Refill  . ALPRAZolam (XANAX) 0.25 MG  tablet TAKE 1 TABLET BY MOUTH TWICE DAILY AS NEEDED 20 tablet 0  . AMETHIA 0.15-0.03 &0.01 MG tablet Take 1 tablet by mouth daily. 1 Package 0  . butalbital-acetaminophen-caffeine (FIORICET) 50-325-40 MG tablet Take 1-2 tablets by mouth every 6 (six) hours as needed for headache. 20 tablet 0  . diazepam (VALIUM) 5 MG tablet Take 1 tablet (5 mg total) by mouth every 8 (eight) hours as needed for anxiety. 21 tablet 0  . ibuprofen (ADVIL) 200 MG tablet Take 3 tablets (600 mg total) by mouth every 8 (eight) hours as needed (with food).    [name] levocetirizine (XYZAL) 5 MG tablet TAKE 1 TABLET (5 MG TOTAL) BY MOUTH EVERY EVENING. 30 tablet 2  . levothyroxine (SYNTHROID, LEVOTHROID) 137 MCG tablet TAKE 1 TABLET (137 MCG TOTAL) BY MOUTH DAILY BEFORE BREAKFAST. 30 tablet 5  . Prenatal Vit-Fe Fumarate-FA (PRENATAL MULTIVITAMIN) TABS tablet Take 1 tablet by mouth daily at 12 noon.    . sertraline (ZOLOFT) 50 MG tablet Take 1/2 tablet daily x 2 weeks, then 1 tablet daily thereafter 30 tablet 3  . benzonatate (TESSALON) 200 MG capsule Take 1 capsule (200 mg total) by mouth 3 (three) times daily as needed for cough. (Patient not taking: Reported on 06/16/2016) 30 capsule 1  No current facility-administered medications on file prior to visit.     BP (!) 144/96   Pulse 83   Temp 98.1 F (36.7 C) (Oral)   Ht 5' 4.75" (1.645 m)   Wt 196 lb 1.9 oz (89 kg)   SpO2 98%   BMI 32.89 kg/m    Objective:   Physical Exam  Constitutional: She appears well-nourished.  HENT:  Right Ear: Tympanic membrane and ear canal normal.  Left Ear: Tympanic membrane and ear canal normal.  Nose: Right sinus exhibits no maxillary sinus tenderness and no frontal sinus tenderness. Left sinus exhibits no maxillary sinus tenderness and no frontal sinus tenderness.  Mouth/Throat: Oropharynx is clear and moist.  Eyes: Conjunctivae are normal.  Neck: Neck supple.  Cardiovascular: Normal rate and regular rhythm.   Pulmonary/Chest:  Effort normal and breath sounds normal. She has no wheezes. She has no rales.  Lymphadenopathy:    She has no cervical adenopathy.  Skin: Skin is warm and dry.          Assessment & Plan:  URI:  Cough, sore throat, hoarseness to voice 5 days. Temporary improvement with OTC treatment. Exam today with clear lungs, stable vital signs, does not appear acutely ill. No suspicion for bacterial involvement at this time. Will treat with conservative measures. Continue Xyzal, benzonatate capsules, Advil, fluids, rest. Return pressures provided.  Sheral Flow, NP

## 2016-06-16 NOTE — Progress Notes (Signed)
Pre visit review using our clinic review tool, if applicable. No additional management support is needed unless otherwise documented below in the visit note. 

## 2016-07-13 ENCOUNTER — Encounter: Payer: Self-pay | Admitting: Medical Oncology

## 2016-07-13 ENCOUNTER — Emergency Department
Admission: EM | Admit: 2016-07-13 | Discharge: 2016-07-13 | Disposition: A | Discharge status: Home / Self Care | Payer: BC Managed Care – PPO | Attending: Emergency Medicine | Admitting: Emergency Medicine

## 2016-07-13 DIAGNOSIS — S21002A Unspecified open wound of left breast, initial encounter: Secondary | ICD-10-CM | POA: Diagnosis not present

## 2016-07-13 DIAGNOSIS — Z791 Long term (current) use of non-steroidal anti-inflammatories (NSAID): Secondary | ICD-10-CM | POA: Insufficient documentation

## 2016-07-13 DIAGNOSIS — E039 Hypothyroidism, unspecified: Secondary | ICD-10-CM | POA: Insufficient documentation

## 2016-07-13 DIAGNOSIS — Y9389 Activity, other specified: Secondary | ICD-10-CM | POA: Insufficient documentation

## 2016-07-13 DIAGNOSIS — X58XXXA Exposure to other specified factors, initial encounter: Secondary | ICD-10-CM | POA: Insufficient documentation

## 2016-07-13 DIAGNOSIS — Y999 Unspecified external cause status: Secondary | ICD-10-CM | POA: Insufficient documentation

## 2016-07-13 DIAGNOSIS — Z23 Encounter for immunization: Secondary | ICD-10-CM | POA: Diagnosis not present

## 2016-07-13 DIAGNOSIS — Z79899 Other long term (current) drug therapy: Secondary | ICD-10-CM | POA: Insufficient documentation

## 2016-07-13 DIAGNOSIS — Y929 Unspecified place or not applicable: Secondary | ICD-10-CM | POA: Insufficient documentation

## 2016-07-13 DIAGNOSIS — S20122A Blister (nonthermal) of breast, left breast, initial encounter: Secondary | ICD-10-CM | POA: Diagnosis present

## 2016-07-13 MED ORDER — SULFAMETHOXAZOLE-TRIMETHOPRIM 800-160 MG PO TABS: 1.0000 | ORAL_TABLET | Freq: Two times a day (BID) | ORAL | 0 refills | Status: DC

## 2016-07-13 MED ORDER — HYDROCODONE-ACETAMINOPHEN 5-325 MG PO TABS: 1.0000 | ORAL_TABLET | ORAL | 0 refills | Status: DC | PRN

## 2016-07-13 MED ORDER — SILVER SULFADIAZINE 1 % EX CREA
TOPICAL_CREAM | CUTANEOUS | Status: AC
  Filled 2016-07-13: qty 85

## 2016-07-13 MED ORDER — TETANUS-DIPHTH-ACELL PERTUSSIS 5-2.5-18.5 LF-MCG/0.5 IM SUSP
0.5000 mL | Freq: Once | INTRAMUSCULAR | Status: AC
  Administered 2016-07-13: 0.5 mL via INTRAMUSCULAR
  Filled 2016-07-13: qty 0.5
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
## 2016-07-13 NOTE — Discharge Instructions (Signed)
Call and make an appointment with your primary care doctor today. Also the phone number for [place] wound care as listed on her discharge papers. Keep area clean and dry. Take Norco as needed for pain. Be aware that you  cannot drive while taking this medication. Bactrim DS twice a day for 10 days. Also have your primary care doctor recheck your blood pressure while urine office. Your blood pressure today initially was 178/104.

## 2016-07-13 NOTE — ED Notes (Signed)
silvedene dressing applied to left breast

## 2016-07-13 NOTE — ED Notes (Signed)
Pt is tearful   Stating that she doesn't feel like she shoulder go home  And would like to see MD

## 2016-07-15 ENCOUNTER — Encounter: Payer: Self-pay | Admitting: Internal Medicine

## 2016-07-15 ENCOUNTER — Telehealth: Payer: Self-pay | Admitting: Internal Medicine

## 2016-07-15 ENCOUNTER — Ambulatory Visit (INDEPENDENT_AMBULATORY_CARE_PROVIDER_SITE_OTHER): Payer: BC Managed Care – PPO | Admitting: Internal Medicine

## 2016-07-15 VITALS — BP 132/80 | HR 84 | Temp 99.0°F | Wt 197.0 lb

## 2016-07-15 DIAGNOSIS — T3695XA Adverse effect of unspecified systemic antibiotic, initial encounter: Secondary | ICD-10-CM | POA: Diagnosis not present

## 2016-07-15 DIAGNOSIS — B379 Candidiasis, unspecified: Secondary | ICD-10-CM | POA: Diagnosis not present

## 2016-07-15 DIAGNOSIS — T2121XD Burn of second degree of chest wall, subsequent encounter: Secondary | ICD-10-CM | POA: Diagnosis not present

## 2016-07-15 MED ORDER — GABAPENTIN 100 MG PO CAPS: 100.0000 mg | ORAL_CAPSULE | Freq: Three times a day (TID) | ORAL | 0 refills | Status: DC

## 2016-07-15 MED ORDER — SILVER SULFADIAZINE 1 % EX CREA: 1.0000 "application " | TOPICAL_CREAM | Freq: Every day | CUTANEOUS | 1 refills | Status: DC

## 2016-07-15 MED ORDER — FLUCONAZOLE 150 MG PO TABS: 150.0000 mg | ORAL_TABLET | Freq: Once | ORAL | 0 refills | Status: AC
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
## 2016-07-15 NOTE — Telephone Encounter (Signed)
Pt called - she is unable to move her appt at the wound center

## 2016-07-15 NOTE — Patient Instructions (Signed)
Burn Care °Your skin is a natural barrier to infection. It is the largest organ of your body. Burns damage this natural protection. To help prevent infection, it is very important to follow your caregiver's instructions in the care of your burn. °Burns are classified as: °· First degree. There is only redness of the skin (erythema). No scarring is expected. °· Second degree. There is blistering of the skin. Scarring may occur with deeper burns. °· Third degree. All layers of the skin are injured, and scarring is expected. °HOME CARE INSTRUCTIONS  °· Wash your hands well before changing your bandage. °· Change your bandage as often as directed by your caregiver. °¨ Remove the old bandage. If the bandage sticks, you may soak it off with cool, clean water. °¨ Cleanse the burn thoroughly but gently with mild soap and water. °¨ Pat the area dry with a clean, dry cloth. °¨ Apply a thin layer of antibacterial cream to the burn. °¨ Apply a clean bandage as instructed by your caregiver. °¨ Keep the bandage as clean and dry as possible. °· Elevate the affected area for the first 24 hours, then as instructed by your caregiver. °· Only take over-the-counter or prescription medicines for pain, discomfort, or fever as directed by your caregiver. °SEEK IMMEDIATE MEDICAL CARE IF:  °· You develop excessive pain. °· You develop redness, tenderness, swelling, or red streaks near the burn. °· The burned area develops yellowish-white fluid (pus) or a bad smell. °· You have a fever. °MAKE SURE YOU:  °· Understand these instructions. °· Will watch your condition. °· Will get help right away if you are not doing well or get worse. °  °This information is not intended to replace advice given to you by your health care provider. Make sure you discuss any questions you have with your health care provider. °  °Document Released: 08/24/2005 Document Revised: 11/16/2011 Document Reviewed: 01/14/2011 °Elsevier Interactive Patient Education ©2016  [employer] ° °

## 2016-07-16 NOTE — Telephone Encounter (Signed)
Left detailed msg on VM per HIPAA  

## 2016-07-16 NOTE — Telephone Encounter (Signed)
Ok, then she will just have to continue wound care as discussed yesterday and follow up on Monday

## 2016-07-20 ENCOUNTER — Encounter: Payer: BC Managed Care – PPO | Attending: Surgery | Admitting: Surgery

## 2016-07-20 DIAGNOSIS — S20122A Blister (nonthermal) of breast, left breast, initial encounter: Secondary | ICD-10-CM | POA: Diagnosis not present

## 2016-07-20 DIAGNOSIS — S21002A Unspecified open wound of left breast, initial encounter: Secondary | ICD-10-CM | POA: Diagnosis not present

## 2016-07-20 DIAGNOSIS — X58XXXA Exposure to other specified factors, initial encounter: Secondary | ICD-10-CM | POA: Insufficient documentation

## 2016-07-20 NOTE — Progress Notes (Signed)
VERNICIA, ROCKWOOD ([id number]) Visit Report for 07/20/2016 Allergy List Details Patient Name: KASHE, TREGER Date of Service: 07/20/2016 8:00 AM Medical Record Number: [id number] Patient Account Number: [id number] Date of Birth/Sex: August 12, 1971 (45 y.o. Female) Treating RN: Montey Hora Primary Care Physician: Webb Silversmith Other Clinician: Referring Physician: Lenise Arena Treating Physician/Extender: Frann Rider in Treatment: 0 Allergies Active Allergies Cipro Keflex trazodone Allergy Notes Electronic Signature(s) Signed: 07/20/2016 4:15:03 PM By: Montey Hora Entered By: Montey Hora on 07/20/2016 08:21:13 [name] ([id number]) -------------------------------------------------------------------------------- Arrival Information Details Patient Name: [name] Date of Service: 07/20/2016 8:00 AM Medical Record Number: [id number] Patient Account Number: [id number] Date of Birth/Sex: 06-18-1971 (45 y.o. Female) Treating RN: Montey Hora Primary Care Physician: Webb Silversmith Other Clinician: Referring Physician: Lenise Arena Treating Physician/Extender: Frann Rider in Treatment: 0 Visit Information Patient Arrived: Ambulatory Arrival Time: 08:14 Accompanied By: self Transfer Assistance: None Patient Identification Verified: Yes Secondary Verification Process Yes Completed: Electronic Signature(s) Signed: 07/20/2016 4:15:03 PM By: Montey Hora Entered By: Montey Hora on 07/20/2016 08:16:07 [name] ([id number]) -------------------------------------------------------------------------------- Clinic Level of Care Assessment Details Patient Name: [name] Date of Service: 07/20/2016 8:00 AM Medical Record Number: [id number] Patient Account Number: [id number] Date of Birth/Sex: [birth year] (45 y.o. Female) Treating RN: Montey Hora Primary Care Physician: Webb Silversmith Other  Clinician: Referring Physician: Lenise Arena Treating Physician/Extender: Frann Rider in Treatment: 0 Clinic Level of Care Assessment Items TOOL 2 Quantity Score []  - Use when only an EandM is performed on the INITIAL visit 0 ASSESSMENTS - Nursing Assessment / Reassessment X - General Physical Exam (combine w/ comprehensive assessment (listed just 1 20 below) when performed on new pt. evals) X - Comprehensive Assessment (HX, ROS, Risk Assessments, Wounds Hx, etc.) 1 25 ASSESSMENTS - Wound and Skin Assessment / Reassessment X - Simple Wound Assessment / Reassessment - one wound 1 5 []  - Complex Wound Assessment / Reassessment - multiple wounds 0 []  - Dermatologic / Skin Assessment (not related to wound area) 0 ASSESSMENTS - Ostomy and/or Continence Assessment and Care []  - Incontinence Assessment and Management 0 []  - Ostomy Care Assessment and Management (repouching, etc.) 0 PROCESS - Coordination of Care X - Simple Patient / Family Education for ongoing care 1 15 []  - Complex (extensive) Patient / Family Education for ongoing care 0 X - Staff obtains Programmer, systems, Records, Test Results / Process Orders 1 10 []  - Staff telephones HHA, Nursing Homes / Clarify orders / etc 0 []  - Routine Transfer to another Facility (non-emergent condition) 0 []  - Routine Hospital Admission (non-emergent condition) 0 []  - New Admissions / Biomedical engineer / Ordering NPWT, Apligraf, etc. 0 []  - Emergency Hospital Admission (emergent condition) 0 X - Simple Discharge Coordination 1 10 Scheuermann, Mouna ([id number]) []  - Complex (extensive) Discharge Coordination 0 PROCESS - Special Needs []  - Pediatric / Minor Patient Management 0 []  - Isolation Patient Management 0 []  - Hearing / Language / Visual special needs 0 []  - Assessment of Community assistance (transportation, D/C planning, etc.) 0 []  - Additional assistance / Altered mentation 0 []  - Support Surface(s) Assessment (bed,  cushion, seat, etc.) 0 INTERVENTIONS - Wound Cleansing / Measurement X - Wound Imaging (photographs - any number of wounds) 1 5 []  - Wound Tracing (instead of photographs) 0 X - Simple Wound Measurement - one wound 1 5 []  - Complex Wound Measurement - multiple wounds 0 X - Simple Wound Cleansing - one wound 1 5 []  - Complex Wound Cleansing - multiple wounds 0 INTERVENTIONS -  Wound Dressings []  - Small Wound Dressing one or multiple wounds 0 X - Medium Wound Dressing one or multiple wounds 1 15 []  - Large Wound Dressing one or multiple wounds 0 []  - Application of Medications - injection 0 INTERVENTIONS - Miscellaneous []  - External ear exam 0 []  - Specimen Collection (cultures, biopsies, blood, body fluids, etc.) 0 []  - Specimen(s) / Culture(s) sent or taken to Lab for analysis 0 []  - Patient Transfer (multiple staff / Harrel Lemon Lift / Similar devices) 0 []  - Simple Staple / Suture removal (25 or less) 0 []  - Complex Staple / Suture removal (26 or more) 0 Niblack, Xareni ([id number]) []  - Hypo / Hyperglycemic Management (close monitor of Blood Glucose) 0 []  - Ankle / Brachial Index (ABI) - do not check if billed separately 0 Has the patient been seen at the hospital within the last three years: Yes Total Score: 115 Level Of Care: New/Established - Level 3 Electronic Signature(s) Signed: 07/20/2016 4:15:03 PM By: Montey Hora Entered By: Montey Hora on 07/20/2016 08:57:52 [name] ([id number]) -------------------------------------------------------------------------------- Encounter Discharge Information Details Patient Name: [name] Date of Service: 07/20/2016 8:00 AM Medical Record Number: [id number] Patient Account Number: [id number] Date of Birth/Sex: 10-Oct-1970 (45 y.o. Female) Treating RN: Montey Hora Primary Care Physician: Webb Silversmith Other Clinician: Referring Physician: Lenise Arena Treating Physician/Extender: Frann Rider in  Treatment: 0 Encounter Discharge Information Items Discharge Pain Level: 0 Discharge Condition: Stable Ambulatory Status: Ambulatory Discharge Destination: Home Transportation: Private Auto Accompanied By: self Schedule Follow-up Appointment: Yes Medication Reconciliation completed and provided to Patient/Care No Jalil Lorusso: Provided on Clinical Summary of Care: 07/20/2016 Form Type Recipient Paper Patient RS Electronic Signature(s) Signed: 07/20/2016 8:59:27 AM By: Ruthine Dose Entered By: Ruthine Dose on 07/20/2016 08:59:27 [name] ([id number]) -------------------------------------------------------------------------------- Multi Wound Chart Details Patient Name: [name] Date of Service: 07/20/2016 8:00 AM Medical Record Number: [id number] Patient Account Number: [id number] Date of Birth/Sex: 1971-09-07 (45 y.o. Female) Treating RN: Montey Hora Primary Care Physician: Webb Silversmith Other Clinician: Referring Physician: Lenise Arena Treating Physician/Extender: Frann Rider in Treatment: 0 Vital Signs Height(in): 65 Pulse(bpm): 71 Weight(lbs): 195 Blood Pressure 146/70 (mmHg): Body Mass Index(BMI): 32 Temperature(F): 97.8 Respiratory Rate 16 (breaths/min): Photos: [N/A:N/A] Wound Location: Left Breast N/A N/A Wounding Event: Blister N/A N/A Primary Etiology: 2nd degree Burn N/A N/A Date Acquired: 07/09/2016 N/A N/A Weeks of Treatment: 0 N/A N/A Wound Status: Open N/A N/A Measurements L x W x D 5.2x5.4x0.1 N/A N/A (cm) Area (cm) : 22.054 N/A N/A Volume (cm) : 2.205 N/A N/A % Reduction in Area: 0.00% N/A N/A % Reduction in Volume: 0.00% N/A N/A Classification: Partial Thickness N/A N/A Exudate Amount: Medium N/A N/A Exudate Type: Serous N/A N/A Exudate Color: amber N/A N/A Wound Margin: Flat and Intact N/A N/A Granulation Amount: Medium (34-66%) N/A N/A Granulation Quality: Pink N/A N/A Necrotic Amount: Medium  (34-66%) N/A N/A Exposed Structures: Fascia: No N/A N/A Fat: No Tendon: No Garrod, Iyahna ([id number]) Muscle: No Joint: No Bone: No Limited to Skin Breakdown Epithelialization: Small (1-33%) N/A N/A Periwound Skin Texture: Edema: No N/A N/A Excoriation: No Induration: No Callus: No Crepitus: No Fluctuance: No Friable: No Rash: No Scarring: No Periwound Skin Moist: Yes N/A N/A Moisture: Maceration: No Dry/Scaly: No Periwound Skin Color: Atrophie Blanche: No N/A N/A Cyanosis: No Ecchymosis: No Erythema: No Hemosiderin Staining: No Mottled: No Pallor: No Rubor: No Temperature: No Abnormality N/A N/A Tenderness on Yes N/A N/A Palpation: Wound Preparation: Ulcer Cleansing: N/A N/A Rinsed/Irrigated with  Saline Topical Anesthetic Applied: Other: lidocaine 4% Treatment Notes Electronic Signature(s) Signed: 07/20/2016 4:15:03 PM By: Montey Hora Entered By: Montey Hora on 07/20/2016 08:45:05 [name] ([id number]) -------------------------------------------------------------------------------- Multi-Disciplinary Care Plan Details Patient Name: [name] Date of Service: 07/20/2016 8:00 AM Medical Record Number: [id number] Patient Account Number: [id number] Date of Birth/Sex: 1971-03-04 (45 y.o. Female) Treating RN: Montey Hora Primary Care Physician: Webb Silversmith Other Clinician: Referring Physician: Lenise Arena Treating Physician/Extender: Frann Rider in Treatment: 0 Active Inactive Orientation to the Wound Care Program Nursing Diagnoses: Knowledge deficit related to the wound healing center program Goals: Patient/caregiver will verbalize understanding of the [place] Program Date Initiated: 07/20/2016 Goal Status: Active Interventions: Provide education on orientation to the wound center Notes: Pain, Acute or Chronic Nursing Diagnoses: Pain, acute or chronic: actual or  potential Goals: Patient/caregiver will verbalize adequate pain control between visits Date Initiated: 07/20/2016 Goal Status: Active Interventions: Assess comfort goal upon admission Notes: Wound/Skin Impairment Nursing Diagnoses: Impaired tissue integrity Goals: Patient/caregiver will verbalize understanding of skin care regimen Date Initiated: 07/20/2016 [name] ([id number]) Goal Status: Active Ulcer/skin breakdown will have a volume reduction of 30% by week 4 Date Initiated: 07/20/2016 Goal Status: Active Ulcer/skin breakdown will have a volume reduction of 50% by week 8 Date Initiated: 07/20/2016 Goal Status: Active Ulcer/skin breakdown will have a volume reduction of 80% by week 12 Date Initiated: 07/20/2016 Goal Status: Active Ulcer/skin breakdown will heal within 14 weeks Date Initiated: 07/20/2016 Goal Status: Active Interventions: Assess patient/caregiver ability to obtain necessary supplies Assess patient/caregiver ability to perform ulcer/skin care regimen upon admission and as needed Assess ulceration(s) every visit Notes: Electronic Signature(s) Signed: 07/20/2016 4:15:03 PM By: Montey Hora Entered By: Montey Hora on 07/20/2016 08:44:42 [name] ([id number]) -------------------------------------------------------------------------------- Pain Assessment Details Patient Name: [name] Date of Service: 07/20/2016 8:00 AM Medical Record Number: [id number] Patient Account Number: [id number] Date of Birth/Sex: February 10, 1971 (45 y.o. Female) Treating RN: Montey Hora Primary Care Physician: Webb Silversmith Other Clinician: Referring Physician: Lenise Arena Treating Physician/Extender: Frann Rider in Treatment: 0 Active Problems Location of Pain Severity and Description of Pain Patient Has Paino Yes Site Locations Pain Location: Pain in Ulcers With Dressing Change: Yes Duration of the Pain. Constant /  Intermittento Constant Rate the pain. Current Pain Level: 4 Pain Management and Medication Current Pain Management: Notes Topical or injectable lidocaine is offered to patient for acute pain when surgical debridement is performed. If needed, Patient is instructed to use over the counter pain medication for the following 24-48 hours after debridement. Wound care MDs do not prescribed pain medications. Patient has chronic pain or uncontrolled pain. Patient has been instructed to make an appointment with their Primary Care Physician for pain management. Electronic Signature(s) Signed: 07/20/2016 4:15:03 PM By: Montey Hora Entered By: Montey Hora on 07/20/2016 08:16:54 [name] ([id number]) -------------------------------------------------------------------------------- Patient/Caregiver Education Details Patient Name: [name] Date of Service: 07/20/2016 8:00 AM Medical Record Number: [id number] Patient Account Number: [id number] Date of Birth/Gender: [birth year] (45 y.o. Female) Treating RN: Montey Hora Primary Care Physician: Webb Silversmith Other Clinician: Referring Physician: Lenise Arena Treating Physician/Extender: Frann Rider in Treatment: 0 Education Assessment Education Provided To: Patient Education Topics Provided Wound/Skin Impairment: Handouts: Other: wound care as ordered Methods: Demonstration, Explain/Verbal Responses: State content correctly Electronic Signature(s) Signed: 07/20/2016 4:15:03 PM By: Montey Hora Entered By: Montey Hora on 07/20/2016 08:59:06 [name] ([id number]) -------------------------------------------------------------------------------- Wound Assessment Details Patient Name: [name] Date of Service: 07/20/2016 8:00 AM Medical Record Number: [id number] Patient  Account Number: [id number] Date of Birth/Sex: 03-Jul-1971 (45 y.o. Female) Treating RN: Montey Hora Primary Care  Physician: Webb Silversmith Other Clinician: Referring Physician: Lenise Arena Treating Physician/Extender: Frann Rider in Treatment: 0 Wound Status Wound Number: 1 Primary Etiology: 2nd degree Burn Wound Location: Left Breast Wound Status: Open Wounding Event: Blister Date Acquired: 07/09/2016 Weeks Of Treatment: 0 Clustered Wound: No Photos Wound Measurements Length: (cm) 5.2 % Reduction in Ar Width: (cm) 5.4 % Reduction in Vo Depth: (cm) 0.1 Epithelialization Area: (cm) 22.054 Tunneling: Volume: (cm) 2.205 Undermining: ea: 0% lume: 0% : Small (1-33%) No No Wound Description Classification: Partial Thickness Foul Odor Aft Wound Margin: Flat and Intact Exudate Amount: Medium Exudate Type: Serous Exudate Color: amber er Cleansing: No Wound Bed Granulation Amount: Medium (34-66%) Exposed Structure Granulation Quality: Pink Fascia Exposed: No Necrotic Amount: Medium (34-66%) Fat Layer Exposed: No Necrotic Quality: Adherent Slough Tendon Exposed: No Muscle Exposed: No Januszewski, Aleni ([id number]) Joint Exposed: No Bone Exposed: No Limited to Skin Breakdown Periwound Skin Texture Texture Color No Abnormalities Noted: No No Abnormalities Noted: No Callus: No Atrophie Blanche: No Crepitus: No Cyanosis: No Excoriation: No Ecchymosis: No Fluctuance: No Erythema: No Friable: No Hemosiderin Staining: No Induration: No Mottled: No Localized Edema: No Pallor: No Rash: No Rubor: No Scarring: No Temperature / Pain Moisture Temperature: No Abnormality No Abnormalities Noted: No Tenderness on Palpation: Yes Dry / Scaly: No Maceration: No Moist: Yes Wound Preparation Ulcer Cleansing: Rinsed/Irrigated with Saline Topical Anesthetic Applied: Other: lidocaine 4%, Treatment Notes Wound #1 (Left Breast) 2. Anesthetic Topical Lidocaine 4% cream to wound bed prior to debridement 4. Dressing Applied: Silvadene Cream Medihoney Gel 5.  Secondary Dressing Applied ABD Pad Non-Adherent pad 7. Secured with Microbiologist) Signed: 07/20/2016 4:15:03 PM By: Montey Hora Entered By: Montey Hora on 07/20/2016 08:42:13 [name] ([id number]) -------------------------------------------------------------------------------- Vitals Details Patient Name: [name] Date of Service: 07/20/2016 8:00 AM Medical Record Number: [id number] Patient Account Number: [id number] Date of Birth/Sex: 1970/09/23 (45 y.o. Female) Treating RN: Montey Hora Primary Care Physician: Webb Silversmith Other Clinician: Referring Physician: Lenise Arena Treating Physician/Extender: Frann Rider in Treatment: 0 Vital Signs Time Taken: 08:16 Temperature (F): 97.8 Height (in): 65 Pulse (bpm): 71 Source: Stated Respiratory Rate (breaths/min): 16 Weight (lbs): 195 Blood Pressure (mmHg): 146/70 Source: Measured Reference Range: 80 - 120 mg / dl Body Mass Index (BMI): 32.4 Electronic Signature(s) Signed: 07/20/2016 4:15:03 PM By: Montey Hora Entered By: Montey Hora on 07/20/2016 08:18:55

## 2016-07-20 NOTE — Progress Notes (Addendum)
[name], [name] ([id number]) Visit Report for 07/20/2016 Chief Complaint Document Details [name], 07/20/2016 8:00 Patient Name: Date of Service: [facility] AM Medical Record Patient Account Number: [id number] Number: Treating RN: Montey Hora Date of Birth/Sex: 1970-10-17 (45 y.o. Female) Other Clinician: Primary Care Physician: Webb Silversmith Treating Christin Fudge Referring Physician: Lenise Arena Physician/Extender: Suella Grove in Treatment: 0 Information Obtained from: Patient Chief Complaint Patient presents to the wound care center for a consult due non healing wound to the left breast which she's had for about 2 weeks Electronic Signature(s) Signed: 07/20/2016 8:47:56 AM By: Christin Fudge MD, FACS Entered By: Christin Fudge on 07/20/2016 08:47:56 [name] ([id number]) -------------------------------------------------------------------------------- HPI Details [name], 07/20/2016 8:00 Patient Name: Date of Service: [facility] AM Medical Record Patient Account Number: [id number] Number: Treating RN: Montey Hora Date of Birth/Sex: 06-24-1971 (45 y.o. Female) Other Clinician: Primary Care Physician: Webb Silversmith Treating Insiya Oshea Referring Physician: Lenise Arena Physician/Extender: Suella Grove in Treatment: 0 History of Present Illness Location: left breast Quality: Patient reports experiencing a sharp pain to affected area(s). Severity: Patient states wound (s) are getting better. Duration: Patient has had the wound for < 2 weeks prior to presenting for treatment Timing: Pain in wound is constant (hurts all the time) Context: The wound occurred when the patient fell asleep while using an ice pack to relieve a sprain of the arm Modifying Factors: Other treatment(s) tried include:pain relievers, Silvadene ointment and local care Associated Signs and Symptoms: Patient reports having increase discharge. HPI Description:  45 year old female, who saw her PCP last week and the ER on 07/13/2016 for a second- degree burn of the left breast. I understand she fell asleep with an ice pack, which caused her damage to the skin of her left breast. She was given treatment with Silvadene cream and was also started on Septra twice a day for 10 days. Past medical history significant for anxiety, headaches and hypothyroidism. She is also status post appendectomy, lap band for bariatric surgery and tonsillectomy. She has never been a smoker. Her hemoglobin A1c was checked in March of this year and was 5.3% Engineer, maintenance) Signed: 07/20/2016 8:49:01 AM By: Christin Fudge MD, FACS Previous Signature: 07/20/2016 8:33:26 AM Version By: Christin Fudge MD, FACS Previous Signature: 07/20/2016 8:18:39 AM Version By: Christin Fudge MD, FACS Previous Signature: 07/20/2016 8:17:41 AM Version By: Christin Fudge MD, FACS Entered By: Christin Fudge on 07/20/2016 08:49:01 [name] ([id number]) -------------------------------------------------------------------------------- Physical Exam Details SHADY, 07/20/2016 8:00 Patient Name: Date of Service: [facility] AM Medical Record Patient Account Number: [id number] Number: Treating RN: Montey Hora Date of Birth/Sex: 08-05-1971 (45 y.o. Female) Other Clinician: Primary Care Physician: Webb Silversmith Treating Christin Fudge Referring Physician: Lenise Arena Physician/Extender: Weeks in Treatment: 0 Constitutional . Pulse regular. Respirations normal and unlabored. Afebrile. . Eyes Nonicteric. Reactive to light. Ears, Nose, Mouth, and Throat Lips, teeth, and gums WNL.[name] Moist mucosa without lesions. Neck supple and nontender. No palpable supraclavicular or cervical adenopathy. Normal sized without goiter. Respiratory WNL. No retractions.. Breath sounds WNL, No rubs, rales, rhonchi, or wheeze.. Cardiovascular Heart rhythm and rate regular, no murmur or  gallop.. Pedal Pulses WNL. No clubbing, cyanosis or edema. Chest Breasts symmetical and no nipple discharge.. the area of the left breast has a superficial ice burn with 3 areas of deeper injury with some slough.. Gastrointestinal (GI) Abdomen without masses or tenderness.. No liver or spleen enlargement or tenderness.. Lymphatic No adneopathy. No adenopathy. No adenopathy. Musculoskeletal Adexa without tenderness or enlargement.. Digits and nails w/o clubbing, cyanosis,  infection, petechiae, ischemia, or inflammatory conditions.. Integumentary (Hair, Skin) No suspicious lesions. No crepitus or fluctuance. No peri-wound warmth or erythema. No masses.[name] Psychiatric Judgement and insight Intact.. No evidence of depression, anxiety, or agitation.. Notes the area of the left breast has a superficial ice burn with 3 areas of deeper injury with some slough. Electronic Signature(s) Signed: 07/20/2016 8:49:54 AM By: Christin Fudge MD, FACS Entered By: Christin Fudge on 07/20/2016 08:49:54 [name], [name] ([id number]) [name], [name] ([id number]) -------------------------------------------------------------------------------- Physician Orders Details ROUDABUSH, 07/20/2016 8:00 Patient Name: Date of Service: [facility] AM Medical Record Patient Account Number: [id number] Number: Treating RN: Montey Hora Date of Birth/Sex: [birth year] (45 y.o. Female) Other Clinician: Primary Care Physician: Webb Silversmith Treating Lennette Fader Referring Physician: Lenise Arena Physician/Extender: Suella Grove in Treatment: 0 Verbal / Phone Orders: No Diagnosis Coding Wound Cleansing Wound #1 Left Breast o Clean wound with Normal Saline. o May Shower, gently pat wound dry prior to applying new dressing. Anesthetic Wound #1 Left Breast o Topical Lidocaine 4% cream applied to wound bed prior to debridement Primary Wound Dressing Wound #1 Left Breast o Silvadene Cream - to all  other areas o Medihoney gel - to white areas Secondary Dressing Wound #1 Left Breast o ABD pad o Non-adherent pad Dressing Change Frequency Wound #1 Left Breast o Change dressing every day. Follow-up Appointments Wound #1 Left Breast o Return Appointment in 1 week. Patient Medications Allergies: Cipro, Keflex, trazodone Notifications Medication Indication Start End MediHoney (honey) 07/20/2016 DOSE topical 100 % paste - paste topical as directed [name], [name] ([id number]) Electronic Signature(s) Signed: 07/20/2016 8:52:28 AM By: Christin Fudge MD, FACS Entered By: Christin Fudge on 07/20/2016 08:52:28 [name] ([id number]) -------------------------------------------------------------------------------- Problem List Details Lamour, 07/20/2016 8:00 Patient Name: Date of Service: [facility] AM Medical Record Patient Account Number: [id number] Number: Treating RN: Montey Hora Date of Birth/Sex: [birth year] (45 y.o. Female) Other Clinician: Primary Care Physician: Webb Silversmith Treating Mauri Tolen Referring Physician: Lenise Arena Physician/Extender: Suella Grove in Treatment: 0 Active Problems ICD-10 Encounter Code Description Active Date Diagnosis S21.002A Unspecified open wound of left breast, initial encounter 07/20/2016 Yes S20.122A Blister (nonthermal) of breast, left breast, initial encounter 07/20/2016 Yes Inactive Problems Resolved Problems Electronic Signature(s) Signed: 07/20/2016 8:47:39 AM By: Christin Fudge MD, FACS Entered By: Christin Fudge on 07/20/2016 08:47:39 [name] ([id number]) -------------------------------------------------------------------------------- Progress Note Details Charnley, 07/20/2016 8:00 Patient Name: Date of Service: [facility] AM Medical Record Patient Account Number: [id number] Number: Treating RN: Montey Hora Date of Birth/Sex: 1971-03-31 (45 y.o. Female) Other  Clinician: Primary Care Physician: Webb Silversmith Treating Vergil Burby Referring Physician: Lenise Arena Physician/Extender: Suella Grove in Treatment: 0 Subjective Chief Complaint Information obtained from Patient Patient presents to the wound care center for a consult due non healing wound to the left breast which she's had for about 2 weeks History of Present Illness (HPI) The following HPI elements were documented for the patient's wound: Location: left breast Quality: Patient reports experiencing a sharp pain to affected area(s). Severity: Patient states wound (s) are getting better. Duration: Patient has had the wound for < 2 weeks prior to presenting for treatment Timing: Pain in wound is constant (hurts all the time) Context: The wound occurred when the patient fell asleep while using an ice pack to relieve a sprain of the arm Modifying Factors: Other treatment(s) tried include:pain relievers, Silvadene ointment and local care Associated Signs and Symptoms: Patient reports having increase discharge. 45 year old female, who saw her PCP last week and the ER on 07/13/2016 for a second-degree burn  of the left breast. I understand she fell asleep with an ice pack, which caused her damage to the skin of her left breast. She was given treatment with Silvadene cream and was also started on Septra twice a day for 10 days. Past medical history significant for anxiety, headaches and hypothyroidism. She is also status post appendectomy, lap band for bariatric surgery and tonsillectomy. She has never been a smoker. Her hemoglobin A1c was checked in March of this year and was 5.3% Wound History Patient presents with 1 open wound that has been present for approximately 10 days ago. Patient has been treating wound in the following manner: silvadene and gauze. Laboratory tests have not been performed in the last month. Patient reportedly has not tested positive for an antibiotic resistant  organism. Patient reportedly has not tested positive for osteomyelitis. Patient reportedly has not had testing performed to evaluate circulation in the legs. Patient History Information obtained from Patient. Allergies [name], [name] ([id number]) Cipro, Keflex, trazodone Social History Never smoker, Marital Status - Single, Alcohol Use - Rarely, Drug Use - No History, Caffeine Use - Daily. Medical And Surgical History Notes Neurologic frequent headaches Review of Systems (ROS) Constitutional Symptoms (General Health) The patient has no complaints or symptoms. Eyes The patient has no complaints or symptoms. Ear/Nose/Mouth/Throat The patient has no complaints or symptoms. Hematologic/Lymphatic The patient has no complaints or symptoms. Respiratory The patient has no complaints or symptoms. Cardiovascular The patient has no complaints or symptoms. Gastrointestinal The patient has no complaints or symptoms. Endocrine Complains or has symptoms of Thyroid disease - hypothyroid. Genitourinary The patient has no complaints or symptoms. Immunological The patient has no complaints or symptoms. Integumentary (Skin) The patient has no complaints or symptoms. Musculoskeletal The patient has no complaints or symptoms. Neurologic The patient has no complaints or symptoms. Oncologic The patient has no complaints or symptoms. Psychiatric Complains or has symptoms of Anxiety. Objective Constitutional [name], [name] ([id number]) Pulse regular. Respirations normal and unlabored. Afebrile. Vitals Time Taken: 8:16 AM, Height: 65 in, Source: Stated, Weight: 195 lbs, Source: Measured, BMI: 32.4, Temperature: 97.8 F, Pulse: 71 bpm, Respiratory Rate: 16 breaths/min, Blood Pressure: 146/70 mmHg. Eyes Nonicteric. Reactive to light. Ears, Nose, Mouth, and Throat Lips, teeth, and gums WNL.[name] Moist mucosa without lesions. Neck supple and nontender. No palpable supraclavicular or  cervical adenopathy. Normal sized without goiter. Respiratory WNL. No retractions.. Breath sounds WNL, No rubs, rales, rhonchi, or wheeze.. Cardiovascular Heart rhythm and rate regular, no murmur or gallop.. Pedal Pulses WNL. No clubbing, cyanosis or edema. Chest Breasts symmetical and no nipple discharge.. the area of the left breast has a superficial ice burn with 3 areas of deeper injury with some slough.. Gastrointestinal (GI) Abdomen without masses or tenderness.. No liver or spleen enlargement or tenderness.. Lymphatic No adneopathy. No adenopathy. No adenopathy. Musculoskeletal Adexa without tenderness or enlargement.. Digits and nails w/o clubbing, cyanosis, infection, petechiae, ischemia, or inflammatory conditions.[name] Psychiatric Judgement and insight Intact.. No evidence of depression, anxiety, or agitation.. General Notes: the area of the left breast has a superficial ice burn with 3 areas of deeper injury with some slough. Integumentary (Hair, Skin) No suspicious lesions. No crepitus or fluctuance. No peri-wound warmth or erythema. No masses.. Wound #1 status is Open. Original cause of wound was Blister. The wound is located on the Left Breast. The wound measures 5.2cm length x 5.4cm width x 0.1cm depth; 22.054cm^2 area and 2.205cm^3 volume. The wound is limited to skin breakdown. There is no tunneling or undermining  noted. There is a medium amount of serous drainage noted. The wound margin is flat and intact. There is medium (34-66%) pink granulation within the wound bed. There is a medium (34-66%) amount of necrotic tissue within the wound bed including Adherent Slough. The periwound skin appearance exhibited: Moist. The periwound skin [name], [name] ([id number]) appearance did not exhibit: Callus, Crepitus, Excoriation, Fluctuance, Friable, Induration, Localized Edema, Rash, Scarring, Dry/Scaly, Maceration, Atrophie Blanche, Cyanosis, Ecchymosis, Hemosiderin  Staining, Mottled, Pallor, Rubor, Erythema. Periwound temperature was noted as No Abnormality. The periwound has tenderness on palpation. Assessment Active Problems ICD-10 S21.002A - Unspecified open wound of left breast, initial encounter S20.122A - Blister (nonthermal) of breast, left breast, initial encounter The patient has had a ice burn to the left breast with good resolution over these 2 weeks. At the present time I have recommended Medihoney to be applied daily with an appropriate dressing over this and to see [place] back next week. She is urged to wash with soap and water and debride the subcutaneous area as well as possible Plan Wound Cleansing: Wound #1 Left Breast: Clean wound with Normal Saline. May Shower, gently pat wound dry prior to applying new dressing. Anesthetic: Wound #1 Left Breast: Topical Lidocaine 4% cream applied to wound bed prior to debridement Primary Wound Dressing: Wound #1 Left Breast: Silvadene Cream - to all other areas Medihoney gel - to white areas Secondary Dressing: Wound #1 Left Breast: ABD pad Non-adherent pad Dressing Change Frequency: Wound #1 Left Breast: Change dressing every day. Follow-up Appointments: Wound #1 Left Breast: [name], [name] ([id number]) Return Appointment in 1 week. The following medication(s) was prescribed: MediHoney (honey) topical 100 % paste paste topical as directed starting 07/20/2016 The patient has had a ice burn to the left breast with good resolution over these 2 weeks. At the present time I have recommended Medihoney to be applied daily with an appropriate dressing over this and to see [place] back next week. She is urged to wash with soap and water and debride the subcutaneous area as well as possible Electronic Signature(s) Signed: 07/20/2016 8:53:47 AM By: Christin Fudge MD, FACS Previous Signature: 07/20/2016 8:51:15 AM Version By: Christin Fudge MD, FACS Entered By: Christin Fudge on 07/20/2016  08:53:47 [name] ([id number]) -------------------------------------------------------------------------------- ROS/PFSH Details [name], 07/20/2016 8:00 Patient Name: Date of Service: [facility] AM Medical Record Patient Account Number: [id number] Number: Treating RN: Montey Hora Date of Birth/Sex: [birth year] (45 y.o. Female) Other Clinician: Primary Care Physician: Webb Silversmith Treating Brandie Lopes Referring Physician: Lenise Arena Physician/Extender: Suella Grove in Treatment: 0 Information Obtained From Patient Wound History Do you currently have one or more open woundso Yes How many open wounds do you currently haveo 1 Approximately how long have you had your woundso 10 days ago How have you been treating your wound(s) until nowo silvadene and gauze Has your wound(s) ever healed and then re-openedo No Have you had any lab work done in the past montho No Have you tested positive for an antibiotic resistant organism (MRSA, VRE)o No Have you tested positive for osteomyelitis (bone infection)o No Have you had any tests for circulation on your legso No Endocrine Complaints and Symptoms: Positive for: Thyroid disease - hypothyroid Psychiatric Complaints and Symptoms: Positive for: Anxiety Constitutional Symptoms (General Health) Complaints and Symptoms: No Complaints or Symptoms Eyes Complaints and Symptoms: No Complaints or Symptoms Ear/Nose/Mouth/Throat Complaints and Symptoms: No Complaints or Symptoms Hematologic/Lymphatic [name], [name] ([id number]) Complaints and Symptoms: No Complaints or Symptoms Respiratory Complaints and Symptoms: No Complaints or Symptoms  Cardiovascular Complaints and Symptoms: No Complaints or Symptoms Gastrointestinal Complaints and Symptoms: No Complaints or Symptoms Genitourinary Complaints and Symptoms: No Complaints or Symptoms Immunological Complaints and Symptoms: No Complaints or  Symptoms Integumentary (Skin) Complaints and Symptoms: No Complaints or Symptoms Musculoskeletal Complaints and Symptoms: No Complaints or Symptoms Neurologic Complaints and Symptoms: No Complaints or Symptoms Medical History: Past Medical History Notes: frequent headaches Oncologic Complaints and Symptoms: No Complaints or Symptoms [name], [name] ([id number]) Immunizations Pneumococcal Vaccine: Received Pneumococcal Vaccination: No Family and Social History Never smoker; Marital Status - Single; Alcohol Use: Rarely; Drug Use: No History; Caffeine Use: Daily; Financial Concerns: No; Food, Clothing or Shelter Needs: No; Support System Lacking: No; Transportation Concerns: No; Advanced Directives: No; Patient does not want information on Advanced Directives Physician Affirmation I have reviewed and agree with the above information. Electronic Signature(s) Signed: 07/20/2016 4:15:03 PM By: Montey Hora Signed: 07/20/2016 4:28:36 PM By: Christin Fudge MD, FACS Entered By: Christin Fudge on 07/20/2016 08:39:52 [name] ([id number]) -------------------------------------------------------------------------------- SuperBill Details Patient Name: [name] Date of Service: 07/20/2016 Medical Record Number: [id number] Patient Account Number: [id number] Date of Birth/Sex: 14-Mar-1971 (45 y.o. Female) Treating RN: Montey Hora Primary Care Physician: Webb Silversmith Other Clinician: Referring Physician: Lenise Arena Treating Physician/Extender: Frann Rider in Treatment: 0 Diagnosis Coding ICD-10 Codes Code Description S21.002A Unspecified open wound of left breast, initial encounter S20.122A Blister (nonthermal) of breast, left breast, initial encounter Physician Procedures CPT4 Code: [id number] Description: [id number] - WC PHYS LEVEL 4 - NEW PT ICD-10 Description Diagnosis S21.002A Unspecified open wound of left breast, initial enco S20.122A Blister  (nonthermal) of breast, left breast, initia Modifier: unter l encounter Quantity: 1 Electronic Signature(s) Signed: 07/20/2016 8:54:08 AM By: Christin Fudge MD, FACS Previous Signature: 07/20/2016 8:51:25 AM Version By: Christin Fudge MD, FACS Entered By: Christin Fudge on 07/20/2016 08:54:07

## 2016-07-20 NOTE — Progress Notes (Signed)
[name], [name] ([id number]) Visit Report for 07/20/2016 Abuse/Suicide Risk Screen Details [name], 07/20/2016 8:00 Patient Name: Date of Service: [facility] AM Medical Record Patient Account Number: [id number] Number: Treating RN: Montey Hora Date of Birth/Sex: [birth year] (45 y.o. Female) Other Clinician: Primary Care Physician: Webb Silversmith Treating Britto, Errol Referring Physician: Lenise Arena Physician/Extender: Weeks in Treatment: 0 Abuse/Suicide Risk Screen Items Answer ABUSE/SUICIDE RISK SCREEN: Has anyone close to you tried to hurt or harm you recentlyo No Do you feel uncomfortable with anyone in your familyo No Has anyone forced you do things that you didnot want to doo No Do you have any thoughts of harming yourselfo No Patient displays signs or symptoms of abuse and/or neglect. No Electronic Signature(s) Signed: 07/20/2016 4:15:03 PM By: Montey Hora Entered By: Montey Hora on 07/20/2016 08:21:24 [name] ([id number]) -------------------------------------------------------------------------------- Activities of Daily Living Details [name], 07/20/2016 8:00 Patient Name: Date of Service: [facility] AM Medical Record Patient Account Number: [id number] Number: Treating RN: Montey Hora Date of Birth/Sex: [birth year] (45 y.o. Female) Other Clinician: Primary Care Physician: Webb Silversmith Treating Christin Fudge Referring Physician: Lenise Arena Physician/Extender: Suella Grove in Treatment: 0 Activities of Daily Living Items Answer Activities of Daily Living (Please select one for each item) Drive Automobile Completely Able Take Medications Completely Able Use Telephone Completely Able Care for Appearance Completely Able Use Toilet Completely Able Bath / Shower Completely Able Dress Self Completely Able Feed Self Completely Able Walk Completely Able Get In / Out Bed Completely Able Housework Completely  Able Prepare Meals Completely Able Handle Money Completely Able Shop for Self Completely Able Electronic Signature(s) Signed: 07/20/2016 4:15:03 PM By: Montey Hora Entered By: Montey Hora on 07/20/2016 08:21:43 [name] ([id number]) -------------------------------------------------------------------------------- Education Assessment Details [name], 07/20/2016 8:00 Patient Name: Date of Service: [facility] AM Medical Record Patient Account Number: [id number] Number: Treating RN: Montey Hora Date of Birth/Sex: [birth year] (45 y.o. Female) Other Clinician: Primary Care Physician: Webb Silversmith Treating Britto, Errol Referring Physician: Lenise Arena Physician/Extender: Suella Grove in Treatment: 0 Primary Learner Assessed: Patient Learning Preferences/Education Level/Primary Language Learning Preference: Explanation, Demonstration Highest Education Level: College or Above Preferred Language: English Cognitive Barrier Assessment/Beliefs Language Barrier: No Translator Needed: No Memory Deficit: No Emotional Barrier: No Cultural/Religious Beliefs Affecting Medical No Care: Physical Barrier Assessment Impaired Vision: No Impaired Hearing: No Decreased Hand dexterity: No Knowledge/Comprehension Assessment Knowledge Level: Medium Comprehension Level: Medium Ability to understand written Medium instructions: Ability to understand verbal Medium instructions: Motivation Assessment Anxiety Level: Calm Cooperation: Cooperative Education Importance: Acknowledges Need Interest in Health Problems: Asks Questions Perception: Coherent Willingness to Engage in Self- Medium Management Activities: Readiness to Engage in Self- Medium Management Activities: [name] ([id number]) Electronic Signature(s) Signed: 07/20/2016 4:15:03 PM By: Montey Hora Entered By: Montey Hora on 07/20/2016 08:22:04 [name]  ([id number]) -------------------------------------------------------------------------------- Fall Risk Assessment Details [name], 07/20/2016 8:00 Patient Name: Date of Service: [facility] AM Medical Record Patient Account Number: [id number] Number: Treating RN: Montey Hora Date of Birth/Sex: [birth year] (45 y.o. Female) Other Clinician: Primary Care Physician: Webb Silversmith Treating Britto, Errol Referring Physician: Lenise Arena Physician/Extender: Suella Grove in Treatment: 0 Fall Risk Assessment Items Have you had 2 or more falls in the last 12 monthso 0 No Have you had any fall that resulted in injury in the last 12 monthso 0 No FALL RISK ASSESSMENT: History of falling - immediate or within 3 months 0 No Secondary diagnosis 0 No Ambulatory aid None/bed rest/wheelchair/nurse 0 Yes Crutches/cane/walker 0 No Furniture 0 No IV Access/Saline Lock 0 No Gait/Training Normal/bed  rest/immobile 0 Yes Weak 0 No Impaired 0 No Mental Status Oriented to own ability 0 Yes Electronic Signature(s) Signed: 07/20/2016 4:15:03 PM By: Montey Hora Entered By: Montey Hora on 07/20/2016 08:22:11 [name] ([id number]) -------------------------------------------------------------------------------- Nutrition Risk Assessment Details [name], 07/20/2016 8:00 Patient Name: Date of Service: [facility] AM Medical Record Patient Account Number: [id number] Number: Treating RN: Montey Hora Date of Birth/Sex: 1971/03/26 (45 y.o. Female) Other Clinician: Primary Care Physician: Webb Silversmith Treating Britto, Errol Referring Physician: Lenise Arena Physician/Extender: Weeks in Treatment: 0 Height (in): 65 Weight (lbs): 195 Body Mass Index (BMI): 32.4 Nutrition Risk Assessment Items NUTRITION RISK SCREEN: I have an illness or condition that made me change the kind and/or 0 No amount of food I eat I eat fewer than two meals per day 0 No I eat few  fruits and vegetables, or milk products 0 No I have three or more drinks of beer, liquor or wine almost every day 0 No I have tooth or mouth problems that make it hard for me to eat 0 No I don't always have enough money to buy the food I need 0 No I eat alone most of the time 0 No I take three or more different prescribed or over-the-counter drugs a 1 Yes day Without wanting to, I have lost or gained 10 pounds in the last six 0 No months I am not always physically able to shop, cook and/or feed myself 0 No Nutrition Protocols Good Risk Protocol 0 No interventions needed Moderate Risk Protocol Electronic Signature(s) Signed: 07/20/2016 4:15:03 PM By: Montey Hora Entered By: Montey Hora on 07/20/2016 08:22:19

## 2016-07-27 ENCOUNTER — Ambulatory Visit: Payer: Self-pay | Admitting: Surgery

## 2016-08-03 ENCOUNTER — Other Ambulatory Visit: Payer: Self-pay | Admitting: Internal Medicine

## 2016-08-03 ENCOUNTER — Encounter: Payer: BC Managed Care – PPO | Admitting: Surgery

## 2016-08-03 DIAGNOSIS — S21002A Unspecified open wound of left breast, initial encounter: Secondary | ICD-10-CM | POA: Diagnosis not present
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
## 2016-08-04 NOTE — Progress Notes (Addendum)
JODY, MITSCH ([id number]) Visit Report for 08/03/2016 Arrival Information Details Patient Name: [name], [name] Date of Service: 08/03/2016 3:45 PM Medical Record Number: [id number] Patient Account Number: [id number] Date of Birth/Sex: 1970-10-16 (45 y.o. Female) Treating RN: Cornell Barman Primary Care Physician: Webb Silversmith Other Clinician: Referring Physician: Webb Silversmith Treating Physician/Extender: Frann Rider in Treatment: 2 Visit Information History Since Last Visit Added or deleted any medications: No Patient Arrived: Ambulatory Any new allergies or adverse reactions: No Arrival Time: 16:02 Had a fall or experienced change in No Accompanied By: self activities of daily living that may affect Transfer Assistance: None risk of falls: Patient Identification Verified: Yes Signs or symptoms of abuse/neglect since last No Secondary Verification Process Yes visito Completed: Hospitalized since last visit: No Has Dressing in Place as Prescribed: Yes Pain Present Now: Yes Electronic Signature(s) Signed: 08/03/2016 5:17:06 PM By: Gretta Cool, RN, BSN, Kim RN, BSN Entered By: Gretta Cool, RN, BSN, Kim on 08/03/2016 16:03:33 [name] ([id number]) -------------------------------------------------------------------------------- Clinic Level of Care Assessment Details Patient Name: [name] Date of Service: 08/03/2016 3:45 PM Medical Record Number: [id number] Patient Account Number: [id number] Date of Birth/Sex: 08-10-1971 (45 y.o. Female) Treating RN: Cornell Barman Primary Care Physician: Webb Silversmith Other Clinician: Referring Physician: Webb Silversmith Treating Physician/Extender: Frann Rider in Treatment: 2 Clinic Level of Care Assessment Items TOOL 4 Quantity Score []  - Use when only an EandM is performed on FOLLOW-UP visit 0 ASSESSMENTS - Nursing Assessment / Reassessment []  - Reassessment of Co-morbidities (includes updates in patient  status) 0 X - Reassessment of Adherence to Treatment Plan 1 5 ASSESSMENTS - Wound and Skin Assessment / Reassessment X - Simple Wound Assessment / Reassessment - one wound 1 5 []  - Complex Wound Assessment / Reassessment - multiple wounds 0 []  - Dermatologic / Skin Assessment (not related to wound area) 0 ASSESSMENTS - Focused Assessment []  - Circumferential Edema Measurements - multi extremities 0 []  - Nutritional Assessment / Counseling / Intervention 0 []  - Lower Extremity Assessment (monofilament, tuning fork, pulses) 0 []  - Peripheral Arterial Disease Assessment (using hand held doppler) 0 ASSESSMENTS - Ostomy and/or Continence Assessment and Care []  - Incontinence Assessment and Management 0 []  - Ostomy Care Assessment and Management (repouching, etc.) 0 PROCESS - Coordination of Care X - Simple Patient / Family Education for ongoing care 1 15 []  - Complex (extensive) Patient / Family Education for ongoing care 0 []  - Staff obtains Programmer, systems, Records, Test Results / Process Orders 0 []  - Staff telephones HHA, Nursing Homes / Clarify orders / etc 0 []  - Routine Transfer to another Facility (non-emergent condition) 0 Harrison, Isabela ([id number]) []  - Routine Hospital Admission (non-emergent condition) 0 []  - New Admissions / Biomedical engineer / Ordering NPWT, Apligraf, etc. 0 []  - Emergency Hospital Admission (emergent condition) 0 X - Simple Discharge Coordination 1 10 []  - Complex (extensive) Discharge Coordination 0 PROCESS - Special Needs []  - Pediatric / Minor Patient Management 0 []  - Isolation Patient Management 0 []  - Hearing / Language / Visual special needs 0 []  - Assessment of Community assistance (transportation, D/C planning, etc.) 0 []  - Additional assistance / Altered mentation 0 []  - Support Surface(s) Assessment (bed, cushion, seat, etc.) 0 INTERVENTIONS - Wound Cleansing / Measurement X - Simple Wound Cleansing - one wound 1 5 []  - Complex Wound  Cleansing - multiple wounds 0 X - Wound Imaging (photographs - any number of wounds) 1 5 []  - Wound Tracing (instead of photographs) 0 X - Simple Wound  Measurement - one wound 1 5 []  - Complex Wound Measurement - multiple wounds 0 INTERVENTIONS - Wound Dressings X - Small Wound Dressing one or multiple wounds 1 10 []  - Medium Wound Dressing one or multiple wounds 0 []  - Large Wound Dressing one or multiple wounds 0 []  - Application of Medications - topical 0 []  - Application of Medications - injection 0 INTERVENTIONS - Miscellaneous []  - External ear exam 0 Parsons, Julie ([id number]) []  - Specimen Collection (cultures, biopsies, blood, body fluids, etc.) 0 []  - Specimen(s) / Culture(s) sent or taken to Lab for analysis 0 []  - Patient Transfer (multiple staff / Harrel Lemon Lift / Similar devices) 0 []  - Simple Staple / Suture removal (25 or less) 0 []  - Complex Staple / Suture removal (26 or more) 0 []  - Hypo / Hyperglycemic Management (close monitor of Blood Glucose) 0 []  - Ankle / Brachial Index (ABI) - do not check if billed separately 0 X - Vital Signs 1 5 Has the patient been seen at the hospital within the last three years: Yes Total Score: 65 Level Of Care: New/Established - Level 2 Electronic Signature(s) Signed: 08/03/2016 5:17:06 PM By: Gretta Cool, RN, BSN, Kim RN, BSN Entered By: Gretta Cool, RN, BSN, Kim on 08/03/2016 17:15:46 [name] ([id number]) -------------------------------------------------------------------------------- Encounter Discharge Information Details Patient Name: [name] Date of Service: 08/03/2016 3:45 PM Medical Record Number: [id number] Patient Account Number: [id number] Date of Birth/Sex: [birth year] (45 y.o. Female) Treating RN: Carolyne Fiscal, Debi Primary Care Physician: Webb Silversmith Other Clinician: Referring Physician: Webb Silversmith Treating Physician/Extender: Frann Rider in Treatment: 2 Encounter Discharge Information  Items Schedule Follow-up Appointment: No Medication Reconciliation completed and provided to Patient/Care No Zeth Buday: Provided on Clinical Summary of Care: 08/03/2016 Form Type Recipient Paper Patient RS Electronic Signature(s) Signed: 08/03/2016 4:18:28 PM By: Ruthine Dose Entered By: Ruthine Dose on 08/03/2016 16:18:27 [name] ([id number]) -------------------------------------------------------------------------------- Multi Wound Chart Details Patient Name: [name] Date of Service: 08/03/2016 3:45 PM Medical Record Number: [id number] Patient Account Number: [id number] Date of Birth/Sex: Sep 23, 1970 (45 y.o. Female) Treating RN: Cornell Barman Primary Care Physician: Webb Silversmith Other Clinician: Referring Physician: Webb Silversmith Treating Physician/Extender: Frann Rider in Treatment: 2 Vital Signs Height(in): 65 Pulse(bpm): 71 Weight(lbs): 195 Blood Pressure 160/92 (mmHg): Body Mass Index(BMI): 32 Temperature(F): 98.1 Respiratory Rate 16 (breaths/min): Photos: [N/A:N/A] Wound Location: Left Breast N/A N/A Wounding Event: Blister N/A N/A Primary Etiology: 2nd degree Burn N/A N/A Date Acquired: 07/09/2016 N/A N/A Weeks of Treatment: 2 N/A N/A Wound Status: Open N/A N/A Measurements L x W x D 2.5x4.5x0.1 N/A N/A (cm) Area (cm) : 8.836 N/A N/A Volume (cm) : 0.884 N/A N/A % Reduction in Area: 59.90% N/A N/A % Reduction in Volume: 59.90% N/A N/A Classification: Partial Thickness N/A N/A Exudate Amount: Medium N/A N/A Exudate Type: Serous N/A N/A Exudate Color: amber N/A N/A Wound Margin: Flat and Intact N/A N/A Granulation Amount: Medium (34-66%) N/A N/A Granulation Quality: Pink N/A N/A Necrotic Amount: Medium (34-66%) N/A N/A Exposed Structures: Fascia: No N/A N/A Fat: No Tendon: No Muscle: No Parsons, Julie ([id number]) Joint: No Bone: No Limited to Skin Breakdown Epithelialization: Medium (34-66%) N/A  N/A Periwound Skin Texture: Excoriation: Yes N/A N/A Edema: No Induration: No Callus: No Crepitus: No Fluctuance: No Friable: No Rash: No Scarring: No Periwound Skin Maceration: No N/A N/A Moisture: Moist: No Dry/Scaly: No Periwound Skin Color: Atrophie Blanche: No N/A N/A Cyanosis: No Ecchymosis: No Erythema: No Hemosiderin Staining: No Mottled: No Pallor: No Rubor: No Temperature:  No Abnormality N/A N/A Tenderness on Yes N/A N/A Palpation: Wound Preparation: Ulcer Cleansing: N/A N/A Rinsed/Irrigated with Saline Topical Anesthetic Applied: Other: lidocaine 4% Treatment Notes Electronic Signature(s) Signed: 08/03/2016 5:17:06 PM By: Gretta Cool, RN, BSN, Kim RN, BSN Entered By: Gretta Cool, RN, BSN, Kim on 08/03/2016 16:11:49 [name] ([id number]) -------------------------------------------------------------------------------- Multi-Disciplinary Care Plan Details Patient Name: [name] Date of Service: 08/03/2016 3:45 PM Medical Record Number: [id number] Patient Account Number: [id number] Date of Birth/Sex: 28-Jan-1971 (45 y.o. Female) Treating RN: Cornell Barman Primary Care Physician: Webb Silversmith Other Clinician: Referring Physician: Webb Silversmith Treating Physician/Extender: Frann Rider in Treatment: 2 Active Inactive Electronic Signature(s) Signed: 08/18/2016 1:38:37 PM By: Gretta Cool RN, BSN, Kim RN, BSN Previous Signature: 08/03/2016 5:17:06 PM Version By: Gretta Cool RN, BSN, Kim RN, BSN Entered By: Gretta Cool, RN, BSN, Kim on 08/17/2016 17:51:41 [name] ([id number]) -------------------------------------------------------------------------------- Pain Assessment Details Patient Name: [name] Date of Service: 08/03/2016 3:45 PM Medical Record Number: [id number] Patient Account Number: [id number] Date of Birth/Sex: 1970/10/04 (45 y.o. Female) Treating RN: Cornell Barman Primary Care Physician: Webb Silversmith Other Clinician: Referring  Physician: Webb Silversmith Treating Physician/Extender: Frann Rider in Treatment: 2 Active Problems Location of Pain Severity and Description of Pain Patient Has Paino Yes Site Locations Pain Location: Generalized Pain, Pain in Ulcers With Dressing Change: Yes Duration of the Pain. Constant / Intermittento Intermittent Rate the pain. Current Pain Level: 5 Character of Pain Describe the Pain: Heavy, Shooting Pain Management and Medication Current Pain Management: Notes Topical or injectable lidocaine is offered to patient for acute pain when surgical debridement is performed. If needed, Patient is instructed to use over the counter pain medication for the following 24-48 hours after debridement. Wound care MDs do not prescribed pain medications. Patient has chronic pain or uncontrolled pain. Patient has been instructed to make an appointment with their Primary Care Physician for pain management. Electronic Signature(s) Signed: 08/03/2016 5:17:06 PM By: Gretta Cool, RN, BSN, Kim RN, BSN Entered By: Gretta Cool, RN, BSN, Kim on 08/03/2016 16:04:25 [name] ([id number]) -------------------------------------------------------------------------------- Wound Assessment Details Patient Name: [name] Date of Service: 08/03/2016 3:45 PM Medical Record Number: [id number] Patient Account Number: [id number] Date of Birth/Sex: Feb 10, 1971 (45 y.o. Female) Treating RN: Cornell Barman Primary Care Physician: Webb Silversmith Other Clinician: Referring Physician: Webb Silversmith Treating Physician/Extender: Frann Rider in Treatment: 2 Wound Status Wound Number: 1 Primary Etiology: 2nd degree Burn Wound Location: Left Breast Wound Status: Open Wounding Event: Blister Date Acquired: 07/09/2016 Weeks Of Treatment: 2 Clustered Wound: No Photos Wound Measurements Length: (cm) 2.5 % Reduction in Ar Width: (cm) 4.5 % Reduction in Vo Depth: (cm) 0.1 Epithelialization Area:  (cm) 8.836 Tunneling: Volume: (cm) 0.884 Undermining: ea: 59.9% lume: 59.9% : Medium (34-66%) No No Wound Description Classification: Partial Thickness Wound Margin: Flat and Intact Exudate Amount: Medium Exudate Type: Serous Exudate Color: amber Foul Odor After Cleansing: No Wound Bed Granulation Amount: Medium (34-66%) Exposed Structure Granulation Quality: Pink Fascia Exposed: No Necrotic Amount: Medium (34-66%) Fat Layer Exposed: No Necrotic Quality: Adherent Slough Tendon Exposed: No Muscle Exposed: No Joint Exposed: No Bone Exposed: No Parsons, Julie ([id number]) Limited to Skin Breakdown Periwound Skin Texture Texture Color No Abnormalities Noted: No No Abnormalities Noted: No Callus: No Atrophie Blanche: No Crepitus: No Cyanosis: No Excoriation: Yes Ecchymosis: No Fluctuance: No Erythema: No Friable: No Hemosiderin Staining: No Induration: No Mottled: No Localized Edema: No Pallor: No Rash: No Rubor: No Scarring: No Temperature / Pain Moisture Temperature: No Abnormality No Abnormalities Noted: No Tenderness on  Palpation: Yes Dry / Scaly: No Maceration: No Moist: No Wound Preparation Ulcer Cleansing: Rinsed/Irrigated with Saline Topical Anesthetic Applied: Other: lidocaine 4%, Electronic Signature(s) Signed: 08/03/2016 5:17:06 PM By: Gretta Cool, RN, BSN, Kim RN, BSN Entered By: Gretta Cool, RN, BSN, Kim on 08/03/2016 16:09:06 [name] ([id number]) -------------------------------------------------------------------------------- Vitals Details Patient Name: [name] Date of Service: 08/03/2016 3:45 PM Medical Record Number: [id number] Patient Account Number: [id number] Date of Birth/Sex: 06/02/1971 (45 y.o. Female) Treating RN: Cornell Barman Primary Care Physician: Webb Silversmith Other Clinician: Referring Physician: Webb Silversmith Treating Physician/Extender: Frann Rider in Treatment: 2 Vital Signs Time Taken:  04:05 Temperature (F): 98.1 Height (in): 65 Pulse (bpm): 71 Weight (lbs): 195 Respiratory Rate (breaths/min): 16 Body Mass Index (BMI): 32.4 Blood Pressure (mmHg): 160/92 Reference Range: 80 - 120 mg / dl Electronic Signature(s) Signed: 08/03/2016 5:17:06 PM By: Gretta Cool, RN, BSN, Kim RN, BSN Entered By: Gretta Cool, RN, BSN, Kim on 08/03/2016 16:06:31

## 2016-08-08 ENCOUNTER — Other Ambulatory Visit: Payer: Self-pay | Admitting: Internal Medicine

## 2016-08-08 NOTE — Telephone Encounter (Signed)
Xanax last filled 06/03/16-- please advise

## 2016-08-10 ENCOUNTER — Ambulatory Visit: Payer: Self-pay | Admitting: Surgery

## 2016-08-10 NOTE — Telephone Encounter (Signed)
Rx called in to pharmacy. 

## 2016-08-10 NOTE — Telephone Encounter (Signed)
Ok to phone in Xanax 

## 2016-08-20 ENCOUNTER — Other Ambulatory Visit: Payer: Self-pay | Admitting: Internal Medicine

## 2016-10-04 ENCOUNTER — Other Ambulatory Visit: Payer: Self-pay | Admitting: Internal Medicine

## 2016-10-09 ENCOUNTER — Ambulatory Visit (INDEPENDENT_AMBULATORY_CARE_PROVIDER_SITE_OTHER): Payer: BC Managed Care – PPO | Admitting: Internal Medicine

## 2016-10-09 ENCOUNTER — Encounter: Payer: Self-pay | Admitting: Internal Medicine

## 2016-10-09 VITALS — BP 136/80 | HR 84 | Temp 98.4°F | Wt 203.8 lb

## 2016-10-09 DIAGNOSIS — J069 Acute upper respiratory infection, unspecified: Secondary | ICD-10-CM | POA: Diagnosis not present

## 2016-10-09 DIAGNOSIS — B9789 Other viral agents as the cause of diseases classified elsewhere: Secondary | ICD-10-CM

## 2016-10-09 MED ORDER — HYDROCODONE-HOMATROPINE 5-1.5 MG/5ML PO SYRP: 5.0000 mL | ORAL_SOLUTION | Freq: Three times a day (TID) | ORAL | 0 refills | Status: DC | PRN

## 2016-10-09 NOTE — Progress Notes (Signed)
HPI  Pt presents to the clinic today with c/o runny nose, post nasal drip and cough. This started 3-4 days ago. She is blowing clear mucous out of his nose. The cough is productive of clear mucous. She has some right side chest wall pain that she thinks is secondary to cough and sneezing. She denies fever, chills or body aches. She has tried Copywriter, advertising and Xyzal with minimal relief. She has a history of allergies but denies asthma. She has had sick contacts.  Review of Systems        Past Medical History:  Diagnosis Date  . Allergy   . Anxiety   . Frequent headaches   . Hypothyroidism     Family History  Problem Relation Age of Onset  . Cancer Mother   . Depression Mother   . Cancer Father   . Hyperlipidemia Father     Social History   Social History  . Marital status: Single    Spouse name: N/A  . Number of children: N/A  . Years of education: N/A   Occupational History  . Not on file.   Social History Main Topics  . Smoking status: Never Smoker  . Smokeless tobacco: Never Used  . Alcohol use Yes     Comment: occasional  . Drug use: No  . Sexual activity: Yes    Partners: Male   Other Topics Concern  . Not on file   Social History Narrative  . No narrative on file    Allergies  Allergen Reactions  . Ciprofloxacin     anxiety  . Keflex [Cephalexin] Hives  . Trazodone And Nefazodone Other (See Comments)    nightmares     Constitutional: Positive fatigue. Denies headache, fever or abrupt weight changes.  HEENT:  Positive runny nose. Denies eye redness, eye pain, pressure behind the eyes, facial pain, nasal congestion, ear pain, ringing in the ears, wax buildup, or sore throat. Respiratory: Positive cough. Denies difficulty breathing or shortness of breath.  Cardiovascular: Denies chest pain, chest tightness, palpitations or swelling in the hands or feet.   No other specific complaints in a complete review of systems (except as listed in HPI  above).  Objective:   BP 136/80   Pulse 84   Temp 98.4 F (36.9 C) (Oral)   Wt 203 lb 12 oz (92.4 kg)   SpO2 98%   BMI 33.91 kg/m  Wt Readings from Last 3 Encounters:  10/09/16 203 lb 12 oz (92.4 kg)  07/15/16 197 lb (89.4 kg)  07/13/16 196 lb (88.9 kg)     General: Appears her stated age, in NAD. HEENT: Head: normal shape and size, no sinus tenderness noted; Ears: Tm's gray and intact, normal light reflex; Nose: mucosa pink and moist, septum midline; Throat/Mouth: + PND. Teeth present, mucosa pink and moist, no exudate noted, no lesions or ulcerations noted.  Neck: No cervical lymphadenopathy.  Cardiovascular: Normal rate and rhythm. S1,S2 noted.  No murmur, rubs or gallops noted.  Pulmonary/Chest: Normal effort and positive vesicular breath sounds. No respiratory distress. No wheezes, rales or ronchi noted.       Assessment & Plan:   Viral Upper Respiratory Infection with Cough:  Get some rest and drink plenty of water Continue Xyzal Add in Flonase or Nasocort OTC Rx for Hycodan cough syrup  RTC as needed or if symptoms persist.   Webb Silversmith, NP

## 2016-10-09 NOTE — Patient Instructions (Signed)
Enterovirus D68, Adult Enterovirus D68 is a common virus that causes a fever, cough, and nasal congestion (upper respiratory infection). What are the causes? Enterovirus D68 spreads from person to person through coughing and sneezing. The virus is present in the fluid of an infected person's lungs (upper respiratory secretions). When a sick person coughs or sneezes, particles get released into the air. You can get infected by breathing in those particles. The virus may also be on any surface where these particles land. You can get infected by touching that surface and then touching your nose or mouth. What increases the risk? This condition is more likely to develop in:  People who have a chronic disease, such as chronic obstructive pulmonary disease (COPD), asthma, congestive heart failure, cystic fibrosis, or diabetes.  People who have a weakened immune system (are immunocompromised). What are the signs or symptoms? For most adults, symptoms of this condition are mild. Symptoms include:  Fever.  Nasal congestion.  Sneezing.  Muscle aches.  Cough.  Wheezing.  Trouble breathing. How is this diagnosed? This condition is diagnosed based on your symptoms and a physical exam. You may also have a chest X-ray. How is this treated? There is no treatment or vaccine for this infection. Most people recover after resting at home for several days. Follow these instructions at home:  Take over-the-counter and prescription medicines only as told by your health care provider.  Rest at home until symptoms go away. Do not go to work while you have symptoms.  Wash your hands often with soap and water for at least 20 seconds. If soap and water are not available, use hand sanitizer.  Drink enough fluid to keep your urine clear or pale yellow.  Keep all follow-up visits as told by your health care provider. This is important. Contact a health care provider if:  You have a fever.  You have  nausea or vomiting.  Your symptoms last longer than 3 days. Get help right away if:  You develop wheezing.  You have trouble breathing.  You cannot keep fluids down. This information is not intended to replace advice given to you by your health care provider. Make sure you discuss any questions you have with your health care provider. Document Released: 08/29/2013 Document Revised: 03/13/2016 Document Reviewed: 12/12/2015 Elsevier Interactive Patient Education  2017 [employer].

## 2016-10-15 ENCOUNTER — Ambulatory Visit: Payer: Self-pay | Admitting: Family Medicine

## 2016-10-22 ENCOUNTER — Ambulatory Visit (INDEPENDENT_AMBULATORY_CARE_PROVIDER_SITE_OTHER): Payer: BC Managed Care – PPO | Admitting: Family Medicine

## 2016-10-22 ENCOUNTER — Encounter: Payer: Self-pay | Admitting: Family Medicine

## 2016-10-22 DIAGNOSIS — J069 Acute upper respiratory infection, unspecified: Secondary | ICD-10-CM | POA: Insufficient documentation

## 2016-10-22 MED ORDER — AZITHROMYCIN 250 MG PO TABS: ORAL_TABLET | ORAL | 0 refills | Status: DC

## 2016-10-22 NOTE — Assessment & Plan Note (Signed)
Established problem, worsening/not improving. Given persistence of symptoms, treating with azithromycin.

## 2016-10-22 NOTE — Progress Notes (Signed)
Pre visit review using our clinic review tool, if applicable. No additional management support is needed unless otherwise documented below in the visit note. 

## 2016-10-22 NOTE — Progress Notes (Signed)
Subjective:  Patient ID: [name], female    DOB: 06/03/1971  Age: 46 y.o. MRN: [id number]  CC: Recent URI, not better  HPI:  46 year old female presents with the above complaint.  Patient was recently seen on 2/2. She was diagnosed with an upper respiratory infection. She was given cough medication and was advised to use over-the-counter nasal steroid in addition to an antihistamine. Patient states that she has not had any improvement with treatment. She continues to have symptoms, particularly cough, congestion, and severe sore throat. She's had a slightly elevated temperature but no documented fever. She's also had significant fatigue. She's continued to use over-the-counter medication without improvement. No other complaints or concerns at this time.  Social Hx   Social History   Social History  . Marital status: Single    Spouse name: N/A  . Number of children: N/A  . Years of education: N/A   Social History Main Topics  . Smoking status: Never Smoker  . Smokeless tobacco: Never Used  . Alcohol use Yes     Comment: occasional  . Drug use: No  . Sexual activity: Yes    Partners: Male   Other Topics Concern  . None   Social History Narrative  . None   Review of Systems  Constitutional: Positive for fatigue.  HENT: Positive for congestion and sore throat.   Respiratory: Positive for cough.    Objective:  BP 138/80 (BP Location: Left Arm, Patient Position: Sitting, Cuff Size: Large)   Pulse 92   Temp 98.3 F (36.8 C) (Oral)   Wt 206 lb (93.4 kg)   SpO2 97%   BMI 34.28 kg/m   BP/Weight 10/22/2016 10/09/2016 [id number]  Systolic BP 0000000 [id number]  Diastolic BP 80 80 80  Wt. (Lbs) 206 203.75 197  BMI 34.28 33.91 32.78   Physical Exam  Constitutional: She is oriented to person, place, and time. She appears well-developed. No distress.  HENT:  Head: Normocephalic and atraumatic.  Mouth/Throat: Oropharynx is clear and moist.  Normal TM's bilaterally.    Eyes: Conjunctivae are normal.  Cardiovascular: Normal rate and regular rhythm.   Pulmonary/Chest: Effort normal and breath sounds normal.  Neurological: She is alert and oriented to person, place, and time.  Psychiatric: She has a normal mood and affect.  Vitals reviewed.   Lab Results  Component Value Date   WBC 6.6 11/07/2015   HGB 11.9 (L) 11/07/2015   HCT 35.2 (L) 11/07/2015   PLT 281.0 11/07/2015   GLUCOSE 91 11/07/2015   CHOL 213 (H) 11/07/2015   TRIG 97.0 11/07/2015   HDL 39.10 11/07/2015   LDLCALC 155 (H) 11/07/2015   ALT 9 11/07/2015   AST 13 11/07/2015   NA 137 11/07/2015   K 3.4 (L) 11/07/2015   CL 102 11/07/2015   CREATININE 0.99 11/07/2015   BUN 14 11/07/2015   CO2 30 11/07/2015   TSH 0.74 11/07/2015   HGBA1C 5.3 11/07/2015    Assessment & Plan:   Problem List Items Addressed This Visit    URI (upper respiratory infection)    Established problem, worsening/not improving. Given persistence of symptoms, treating with azithromycin.      Relevant Medications   azithromycin (ZITHROMAX) 250 MG tablet      Meds ordered this encounter  Medications  . azithromycin (ZITHROMAX) 250 MG tablet    Sig: 2 tablets on Day 1, then 1 tablet daily on days 2-5.    Dispense:  6 tablet    Refill:  0    Follow-up: PRN  [place]

## 2016-10-22 NOTE — Patient Instructions (Signed)
Take the antibiotic as prescribed.  It does well for pharyngitis and other respiratory infections (I cannot use other first line agents due to your allergies).  Feel better  Dr. Lacinda Axon

## 2016-10-26 ENCOUNTER — Ambulatory Visit (INDEPENDENT_AMBULATORY_CARE_PROVIDER_SITE_OTHER): Payer: BC Managed Care – PPO | Admitting: Internal Medicine

## 2016-10-26 ENCOUNTER — Encounter: Payer: Self-pay | Admitting: Internal Medicine

## 2016-10-26 VITALS — BP 132/76 | HR 78 | Temp 98.1°F | Wt 202.0 lb

## 2016-10-26 DIAGNOSIS — J040 Acute laryngitis: Secondary | ICD-10-CM

## 2016-10-26 DIAGNOSIS — R05 Cough: Secondary | ICD-10-CM | POA: Diagnosis not present

## 2016-10-26 DIAGNOSIS — R059 Cough, unspecified: Secondary | ICD-10-CM

## 2016-10-26 MED ORDER — PREDNISONE 10 MG PO TABS: ORAL_TABLET | ORAL | 0 refills | Status: DC

## 2016-10-26 NOTE — Progress Notes (Signed)
Subjective:    Patient ID: [name], female    DOB: 1970/11/13, 46 y.o.   MRN: [id number]  HPI  Pt presents to the clinic today with c/o cough. The cough is productive of clear mucous. She has associated laryngitis. She denies runny nose, ear pain or shortness of breath. She was seen 10/09/16, diagnosed with a viral URI. She was advised to take Xyzal, Flonase and given RX for Hycodan. She was seen again 10/22/16, diagnosed with a URI. She was treated with Azithromycin. She is still currently taking the antibiotics. She has also tried [place], Hycodan and Allergy Medication.   Review of Systems      Past Medical History:  Diagnosis Date  . Allergy   . Anxiety   . Frequent headaches   . Hypothyroidism     Current Outpatient Prescriptions  Medication Sig Dispense Refill  . ALPRAZolam (XANAX) 0.25 MG tablet TAKE 1 TABLET BY MOUTH TWICE DAILY AS NEEDED 20 tablet 0  . AMETHIA 0.15-0.03 &0.01 MG tablet Take 1 tablet by mouth daily. 1 Package 0  . azithromycin (ZITHROMAX) 250 MG tablet 2 tablets on Day 1, then 1 tablet daily on days 2-5. 6 tablet 0  . butalbital-acetaminophen-caffeine (FIORICET, ESGIC) 50-325-40 MG tablet TAKE 1 TO 2 TABLETS BY MOUTH EVERY 6 HOURS AS NEEDED FOR HEADACHE 20 tablet 0  . gabapentin (NEURONTIN) 100 MG capsule TAKE 1 CAPSULE (100 MG TOTAL) BY MOUTH 3 (THREE) TIMES DAILY. 30 capsule 2  . levocetirizine (XYZAL) 5 MG tablet TAKE 1 TABLET (5 MG TOTAL) BY MOUTH EVERY EVENING. 30 tablet 2  . levothyroxine (SYNTHROID, LEVOTHROID) 137 MCG tablet Take 1 tablet (137 mcg total) by mouth daily before breakfast. MUST SCHEDULE ANNUAL EXAM FOR March 2018 30 tablet 1  . Prenatal Vit-Fe Fumarate-FA (PRENATAL MULTIVITAMIN) TABS tablet Take 1 tablet by mouth daily at 12 noon.    . sertraline (ZOLOFT) 50 MG tablet Take 1 tablet (50 mg total) by mouth daily. 30 tablet 2   No current facility-administered medications for this visit.     Allergies  Allergen Reactions    . Ciprofloxacin     anxiety  . Keflex [Cephalexin] Hives  . Trazodone And Nefazodone Other (See Comments)    nightmares    Family History  Problem Relation Age of Onset  . Cancer Mother   . Depression Mother   . Cancer Father   . Hyperlipidemia Father     Social History   Social History  . Marital status: Single    Spouse name: N/A  . Number of children: N/A  . Years of education: N/A   Occupational History  . Not on file.   Social History Main Topics  . Smoking status: Never Smoker  . Smokeless tobacco: Never Used  . Alcohol use Yes     Comment: occasional  . Drug use: No  . Sexual activity: Yes    Partners: Male   Other Topics Concern  . Not on file   Social History Narrative  . No narrative on file     Constitutional: Pt reports fatigue. Denies fever, malaise, headache or abrupt weight changes.  HEENT: Denies eye pain, eye redness, ear pain, ringing in the ears, wax buildup, runny nose, nasal congestion, bloody nose, or sore throat. Respiratory: Pt reports cough. Denies difficulty breathing, shortness of breath, or sputum production.   Cardiovascular: Denies chest pain, chest tightness, palpitations or swelling in the hands or feet.   No other specific complaints in a  complete review of systems (except as listed in HPI above).  Objective:   Physical Exam  BP 132/76   Pulse 78   Temp 98.1 F (36.7 C) (Oral)   Wt 202 lb (91.6 kg)   SpO2 98%   BMI 33.61 kg/m  Wt Readings from Last 3 Encounters:  10/26/16 202 lb (91.6 kg)  10/22/16 206 lb (93.4 kg)  10/09/16 203 lb 12 oz (92.4 kg)    General: Appears her stated age, in NAD. HEENT: Head: normal shape and size, no sinus tenderness noted; Ears: Tm's gray and intact, normal light reflex; Throat/Mouth: Teeth present, mucosa pink and moist, + PND, no exudate, lesions or ulcerations noted.  Neck:  No adenopathy noted. Cardiovascular: Normal rate and rhythm.  Pulmonary/Chest: Normal effort and positive  vesicular breath sounds. No respiratory distress. No wheezes, rales or ronchi noted.  Abdomen: Soft and nontender. Normal bowel sounds.   BMET    Component Value Date/Time   NA 137 11/07/2015 1458   K 3.4 (L) 11/07/2015 1458   CL 102 11/07/2015 1458   CO2 30 11/07/2015 1458   GLUCOSE 91 11/07/2015 1458   BUN 14 11/07/2015 1458   CREATININE 0.99 11/07/2015 1458   CALCIUM 9.2 11/07/2015 1458    Lipid Panel     Component Value Date/Time   CHOL 213 (H) 11/07/2015 1458   TRIG 97.0 11/07/2015 1458   HDL 39.10 11/07/2015 1458   CHOLHDL 5 11/07/2015 1458   VLDL 19.4 11/07/2015 1458   LDLCALC 155 (H) 11/07/2015 1458    CBC    Component Value Date/Time   WBC 6.6 11/07/2015 1458   RBC 4.11 11/07/2015 1458   HGB 11.9 (L) 11/07/2015 1458   HCT 35.2 (L) 11/07/2015 1458   PLT 281.0 11/07/2015 1458   MCV 85.7 11/07/2015 1458   MCHC 33.9 11/07/2015 1458   RDW 13.8 11/07/2015 1458    Hgb A1C Lab Results  Component Value Date   HGBA1C 5.3 11/07/2015            Assessment & Plan:   Cough and laryngitis:  I am concerned that this may be silent reflux Discussed trialing Prilosec OTC She really feels like this is more URI eRx for Pred Taper, discussed if this is silent reflux, Prednisone may make her worse If Prednisone doesn't help, she is agreeable to taking Prilosec OTC  RTC as needed or if symptoms persist or worsen Alfonse Garringer, NP

## 2016-10-26 NOTE — Patient Instructions (Signed)
Cough, Adult Introduction A cough helps to clear your throat and lungs. A cough may last only 2-3 weeks (acute), or it may last longer than 8 weeks (chronic). Many different things can cause a cough. A cough may be a sign of an illness or another medical condition. Follow these instructions at home:  Pay attention to any changes in your cough.  Take medicines only as told by your doctor.  If you were prescribed an antibiotic medicine, take it as told by your doctor. Do not stop taking it even if you start to feel better.  Talk with your doctor before you try using a cough medicine.  Drink enough fluid to keep your pee (urine) clear or pale yellow.  If the air is dry, use a cold steam vaporizer or humidifier in your home.  Stay away from things that make you cough at work or at home.  If your cough is worse at night, try using extra pillows to raise your head up higher while you sleep.  Do not smoke, and try not to be around smoke. If you need help quitting, ask your doctor.  Do not have caffeine.  Do not drink alcohol.  Rest as needed. Contact a doctor if:  You have new problems (symptoms).  You cough up yellow fluid (pus).  Your cough does not get better after 2-3 weeks, or your cough gets worse.  Medicine does not help your cough and you are not sleeping well.  You have pain that gets worse or pain that is not helped with medicine.  You have a fever.  You are losing weight and you do not know why.  You have night sweats. Get help right away if:  You cough up blood.  You have trouble breathing.  Your heartbeat is very fast. This information is not intended to replace advice given to you by your health care provider. Make sure you discuss any questions you have with your health care provider. Document Released: 05/07/2011 Document Revised: 01/30/2016 Document Reviewed: 10/31/2014  2017 Elsevier  

## 2016-11-04 ENCOUNTER — Other Ambulatory Visit: Payer: Self-pay | Admitting: Internal Medicine

## 2016-11-04 NOTE — Telephone Encounter (Signed)
Ok to phone in Xanax 

## 2016-11-04 NOTE — Telephone Encounter (Signed)
Rx called in to pharmacy. 

## 2016-11-04 NOTE — Telephone Encounter (Signed)
Last filled 08/10/16--please advise

## 2016-12-03 ENCOUNTER — Other Ambulatory Visit: Payer: Self-pay | Admitting: Internal Medicine

## 2016-12-10 ENCOUNTER — Other Ambulatory Visit: Payer: Self-pay | Admitting: Internal Medicine

## 2016-12-10 NOTE — Telephone Encounter (Signed)
Ok to phone in Xanax. Gabapentin sent electronically.

## 2016-12-10 NOTE — Telephone Encounter (Signed)
Both last filled 11-04-16 Last OV Acute 10-26-16 for Cough No Future OV

## 2016-12-10 NOTE — Telephone Encounter (Signed)
Left refill on voice mail at pharmacy  

## 2016-12-23 ENCOUNTER — Encounter: Payer: Self-pay | Admitting: Family Medicine

## 2016-12-23 ENCOUNTER — Ambulatory Visit (INDEPENDENT_AMBULATORY_CARE_PROVIDER_SITE_OTHER): Payer: BC Managed Care – PPO | Admitting: Family Medicine

## 2016-12-23 ENCOUNTER — Ambulatory Visit: Payer: BC Managed Care – PPO | Admitting: Internal Medicine

## 2016-12-23 VITALS — BP 160/88 | HR 121 | Temp 98.8°F | Wt 206.0 lb

## 2016-12-23 DIAGNOSIS — R112 Nausea with vomiting, unspecified: Secondary | ICD-10-CM

## 2016-12-23 DIAGNOSIS — R519 Headache, unspecified: Secondary | ICD-10-CM

## 2016-12-23 DIAGNOSIS — B373 Candidiasis of vulva and vagina: Secondary | ICD-10-CM

## 2016-12-23 DIAGNOSIS — J069 Acute upper respiratory infection, unspecified: Secondary | ICD-10-CM | POA: Diagnosis not present

## 2016-12-23 DIAGNOSIS — B3731 Acute candidiasis of vulva and vagina: Secondary | ICD-10-CM

## 2016-12-23 DIAGNOSIS — R51 Headache: Secondary | ICD-10-CM

## 2016-12-23 DIAGNOSIS — J029 Acute pharyngitis, unspecified: Secondary | ICD-10-CM

## 2016-12-23 DIAGNOSIS — G8929 Other chronic pain: Secondary | ICD-10-CM

## 2016-12-23 MED ORDER — BUTALBITAL-APAP-CAFFEINE 50-325-40 MG PO TABS: ORAL_TABLET | ORAL | 0 refills | Status: DC

## 2016-12-23 MED ORDER — ONDANSETRON 8 MG PO TBDP: 8.0000 mg | ORAL_TABLET | Freq: Three times a day (TID) | ORAL | 0 refills | Status: DC | PRN

## 2016-12-23 MED ORDER — AMOXICILLIN 500 MG PO CAPS: 500.0000 mg | ORAL_CAPSULE | Freq: Three times a day (TID) | ORAL | 0 refills | Status: DC

## 2016-12-23 MED ORDER — IBUPROFEN 200 MG PO TABS
600.0000 mg | ORAL_TABLET | Freq: Once | ORAL | Status: AC
  Administered 2016-12-23: 600 mg via ORAL

## 2016-12-23 NOTE — Progress Notes (Signed)
Pre visit review using our clinic review tool, if applicable. No additional management support is needed unless otherwise documented below in the visit note. 

## 2016-12-23 NOTE — Patient Instructions (Addendum)
For nasal congestion you can use Afrin nasal spray for 3 days max, Sudafed, saline nasal spray (generic is fine for all). For cough you can try Delsym. Drink enough fluids to make your urine light yellow. For fever/chill/muscle aches you can take over the counter acetaminophen or ibuprofen.  Please come back in if you are not better in 5-7 days or if you develop wheezing, shortness of breath or persistent vomiting.   

## 2016-12-23 NOTE — Progress Notes (Signed)
Subjective:    Patient ID: [name], female    DOB: [birth year], 46 y.o.   MRN: [id number]  HPI This is a 46 yo female who presents today with sore throat and fatigue x 1 day.Sudden onset. Feels terrible, exhausted, achy all over. Vomited x 1 today. Has had more headaches over the last 5 days. No SOB or wheeze, little cough, some nasal congestion. Took some tylenol this morning with a little relief. Little water intake today. History of frequent strep throat and had tonsillectomy in 2012. Has had one episode of an atypical bacteria that grew on culture.Was treated with penicillin with good relief. Has had subjective fever.   Past Medical History:  Diagnosis Date  . Allergy   . Anxiety   . Frequent headaches   . Hypothyroidism    Past Surgical History:  Procedure Laterality Date  . APPENDECTOMY  2013  . lapband  2013  . TONSILLECTOMY  2012   Family History  Problem Relation Age of Onset  . Cancer Mother   . Depression Mother   . Cancer Father   . Hyperlipidemia Father    Social History  Substance Use Topics  . Smoking status: Never Smoker  . Smokeless tobacco: Never Used  . Alcohol use Yes     Comment: occasional      Review of Systems Per HPI    Objective:   Physical Exam  Constitutional: She is oriented to person, place, and time. She appears well-developed and well-nourished. She appears ill. No distress.  HENT:  Head: Normocephalic and atraumatic.  Right Ear: Tympanic membrane, external ear and ear canal normal.  Left Ear: Tympanic membrane, external ear and ear canal normal.  Nose: Nose normal.  Mouth/Throat: Uvula is midline. Posterior oropharyngeal erythema present. No posterior oropharyngeal edema.  Tonsils surgically absent. Oropharynx very erythematous.   Eyes: Conjunctivae are normal.  Neck: Normal range of motion. Neck supple.  Cardiovascular: Normal rate, regular rhythm and normal heart sounds.   Pulmonary/Chest: Effort normal and breath  sounds normal.  Lymphadenopathy:    She has no cervical adenopathy.  Neurological: She is alert and oriented to person, place, and time.  Skin: Skin is warm and dry.  Psychiatric: She has a normal mood and affect. Her behavior is normal. Judgment and thought content normal.  Vitals reviewed.   BP (!) 178/98 (BP Location: Right Arm, Patient Position: Sitting, Cuff Size: Large)   Pulse (!) 121   Temp 98.8 F (37.1 C) (Oral)   Wt 206 lb (93.4 kg)   SpO2 98%   BMI 34.28 kg/m  Wt Readings from Last 3 Encounters:  12/23/16 206 lb (93.4 kg)  10/26/16 202 lb (91.6 kg)  10/22/16 206 lb (93.4 kg)   Recheck BP- 160/88, HR 104    Assessment & Plan:  1. Pharyngitis, unspecified etiology - given appearance of throat and recurrent infections including post tonsillectomy, will go ahead and treat for bacterial infection - amoxicillin (AMOXIL) 500 MG capsule; Take 1 capsule (500 mg total) by mouth 3 (three) times daily.  Dispense: 20 capsule; Refill: 0 - ibuprofen (ADVIL,MOTRIN) tablet 600 mg; Take 3 tablets (600 mg total) by mouth once. - ER/RTC precautions reviewed - encouraged increased fluids and discussed acetaminophen/ibuprofen for pain/fever  2. Upper respiratory tract infection, unspecified type - ibuprofen (ADVIL,MOTRIN) tablet 600 mg; Take 3 tablets (600 mg total) by mouth once.  3. Non-intractable vomiting with nausea, unspecified vomiting type - ondansetron (ZOFRAN-ODT) 8 MG disintegrating tablet; Take 1 tablet (8  mg total) by mouth every 8 (eight) hours as needed for nausea.  Dispense: 20 tablet; Refill: 0  4. Chronic nonintractable headache, unspecified headache type - butalbital-acetaminophen-caffeine (FIORICET, ESGIC) 50-325-40 MG tablet; TAKE 1 TO 2 TABLETS BY MOUTH EVERY 6 HOURS AS NEEDED FOR HEADACHE  Dispense: 20 tablet; Refill: 0  5. Vaginal candidiasis - fluconazole (DIFLUCAN) 150 MG tablet; Take 1 tablet (150 mg total) by mouth once. Repeat if needed  Dispense: 2  tablet; Refill: 0   Clarene Reamer, FNP-BC  [facility] [place]  12/25/2016 10:52 AM

## 2016-12-25 MED ORDER — FLUCONAZOLE 150 MG PO TABS: 150.0000 mg | ORAL_TABLET | Freq: Once | ORAL | 0 refills | Status: AC

## 2016-12-29 ENCOUNTER — Telehealth: Payer: Self-pay

## 2016-12-29 MED ORDER — BENZONATATE 200 MG PO CAPS: 200.0000 mg | ORAL_CAPSULE | Freq: Three times a day (TID) | ORAL | 0 refills | Status: DC | PRN

## 2016-12-29 NOTE — Telephone Encounter (Signed)
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

## 2016-12-29 NOTE — Telephone Encounter (Signed)
Tessalon sent to pharmacy

## 2016-12-29 NOTE — Addendum Note (Signed)
Addended by: Jearld Fenton on: 12/29/2016 05:59 PM   Modules accepted: Orders

## 2017-01-05 ENCOUNTER — Other Ambulatory Visit: Payer: Self-pay | Admitting: Internal Medicine

## 2017-01-22 ENCOUNTER — Other Ambulatory Visit: Payer: Self-pay | Admitting: Internal Medicine

## 2017-02-06 ENCOUNTER — Other Ambulatory Visit: Payer: Self-pay | Admitting: Internal Medicine

## 2017-02-10 ENCOUNTER — Other Ambulatory Visit: Payer: Self-pay | Admitting: Internal Medicine

## 2017-02-10 DIAGNOSIS — G8929 Other chronic pain: Secondary | ICD-10-CM

## 2017-02-10 DIAGNOSIS — R51 Headache: Principal | ICD-10-CM

## 2017-02-10 DIAGNOSIS — R519 Headache, unspecified: Secondary | ICD-10-CM

## 2017-02-10 NOTE — Telephone Encounter (Signed)
Baity pt, last filled 12/10/2016... Please advise

## 2017-02-10 NOTE — Telephone Encounter (Signed)
Last filled 12/23/16. Please advise.  

## 2017-02-10 NOTE — Telephone Encounter (Signed)
Rx called in as prescribed 

## 2017-02-10 NOTE — Telephone Encounter (Signed)
Ok to phone in Fioricet 

## 2017-02-10 NOTE — Telephone Encounter (Signed)
Px written for call in   Will refill times one in PCP absence

## 2017-02-11 NOTE — Telephone Encounter (Signed)
Rx called in to pharmacy. 

## 2017-03-08 ENCOUNTER — Other Ambulatory Visit: Payer: Self-pay | Admitting: Internal Medicine

## 2017-03-09 ENCOUNTER — Other Ambulatory Visit: Payer: Self-pay | Admitting: Internal Medicine

## 2017-03-12 ENCOUNTER — Other Ambulatory Visit: Payer: Self-pay | Admitting: *Deleted

## 2017-03-12 MED ORDER — ALPRAZOLAM 0.25 MG PO TABS: 0.2500 mg | ORAL_TABLET | Freq: Two times a day (BID) | ORAL | 0 refills | Status: DC | PRN

## 2017-03-12 NOTE — Telephone Encounter (Signed)
Webb Silversmith out of the office this week, last filled on 02/10/17 #20 tabs with 0 refills, please advise

## 2017-03-12 NOTE — Telephone Encounter (Signed)
Px written for call in   Refilled times 1 in pcp absence

## 2017-03-12 NOTE — Telephone Encounter (Signed)
Rx called in as prescribed 

## 2017-03-19 ENCOUNTER — Encounter: Payer: Self-pay | Admitting: Internal Medicine

## 2017-03-19 ENCOUNTER — Ambulatory Visit (INDEPENDENT_AMBULATORY_CARE_PROVIDER_SITE_OTHER)
Admission: RE | Admit: 2017-03-19 | Discharge: 2017-03-19 | Disposition: A | Discharge status: Home / Self Care | Payer: BC Managed Care – PPO | Source: Ambulatory Visit | Attending: Internal Medicine | Admitting: Internal Medicine

## 2017-03-19 ENCOUNTER — Ambulatory Visit (INDEPENDENT_AMBULATORY_CARE_PROVIDER_SITE_OTHER): Payer: BC Managed Care – PPO | Admitting: Internal Medicine

## 2017-03-19 VITALS — BP 138/92 | HR 90 | Temp 98.4°F | Wt 201.5 lb

## 2017-03-19 DIAGNOSIS — M79671 Pain in right foot: Secondary | ICD-10-CM

## 2017-03-19 DIAGNOSIS — M533 Sacrococcygeal disorders, not elsewhere classified: Secondary | ICD-10-CM

## 2017-03-19 DIAGNOSIS — M25532 Pain in left wrist: Secondary | ICD-10-CM | POA: Diagnosis not present

## 2017-03-19 MED ORDER — HYDROCODONE-ACETAMINOPHEN 5-325 MG PO TABS: 1.0000 | ORAL_TABLET | Freq: Three times a day (TID) | ORAL | 0 refills | Status: DC | PRN
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
## 2017-04-04 ENCOUNTER — Other Ambulatory Visit: Payer: Self-pay | Admitting: Internal Medicine

## 2017-04-05 ENCOUNTER — Other Ambulatory Visit: Payer: Self-pay | Admitting: Internal Medicine

## 2017-04-05 DIAGNOSIS — R51 Headache: Principal | ICD-10-CM

## 2017-04-05 DIAGNOSIS — R519 Headache, unspecified: Secondary | ICD-10-CM

## 2017-04-05 DIAGNOSIS — G8929 Other chronic pain: Secondary | ICD-10-CM

## 2017-04-06 ENCOUNTER — Other Ambulatory Visit: Payer: Self-pay | Admitting: Internal Medicine

## 2017-04-06 NOTE — Telephone Encounter (Signed)
Fioricet called in

## 2017-04-06 NOTE — Telephone Encounter (Signed)
Ok to call in Xanax on Monday. Ok to phone in [employer]

## 2017-04-06 NOTE — Telephone Encounter (Signed)
Fioricet last filled 02/10/2017.Marland KitchenMarland KitchenXanax last filled 03/12/17.Marland Kitchenplease advise if ok to call in Mon for Xanax

## 2017-04-07 NOTE — Telephone Encounter (Signed)
Last filled 12/10/16... Please advise

## 2017-04-12 MED ORDER — ALPRAZOLAM 0.25 MG PO TABS: 0.2500 mg | ORAL_TABLET | Freq: Two times a day (BID) | ORAL | 0 refills | Status: DC | PRN

## 2017-04-12 NOTE — Addendum Note (Signed)
Addended by: Lurlean Nanny on: 04/12/2017 11:42 AM   Modules accepted: Orders

## 2017-04-12 NOTE — Telephone Encounter (Signed)
-----   Message from [place] sent at 04/07/2017  1:31 PM EDT ----- Call in Xanax

## 2017-04-12 NOTE — Telephone Encounter (Signed)
Rx called in to pharmacy. 

## 2017-04-22 ENCOUNTER — Ambulatory Visit (INDEPENDENT_AMBULATORY_CARE_PROVIDER_SITE_OTHER): Payer: BC Managed Care – PPO | Admitting: Internal Medicine

## 2017-04-22 ENCOUNTER — Encounter: Payer: Self-pay | Admitting: Internal Medicine

## 2017-04-22 VITALS — BP 132/88 | HR 70 | Temp 97.9°F | Ht 65.0 in | Wt 202.0 lb

## 2017-04-22 DIAGNOSIS — Z0000 Encounter for general adult medical examination without abnormal findings: Secondary | ICD-10-CM

## 2017-04-22 LAB — COMPREHENSIVE METABOLIC PANEL
ALT: 15 U/L (ref 0–35)
AST: 19 U/L (ref 0–37)
Albumin: 3.7 g/dL (ref 3.5–5.2)
Alkaline Phosphatase: 85 U/L (ref 39–117)
BILIRUBIN TOTAL: 0.3 mg/dL (ref 0.2–1.2)
BUN: 11 mg/dL (ref 6–23)
CALCIUM: 9.2 mg/dL (ref 8.4–10.5)
CO2: 27 meq/L (ref 19–32)
Chloride: 103 mEq/L (ref 96–112)
Creatinine, Ser: 0.85 mg/dL (ref 0.40–1.20)
GFR: 76.89 mL/min (ref >60.00)
Glucose, Bld: 85 mg/dL (ref 70–99)
Potassium: 4.1 mEq/L (ref 3.5–5.1)
Sodium: 137 mEq/L (ref 135–145)
Total Protein: 6.4 g/dL (ref 6.0–8.3)

## 2017-04-22 LAB — CBC
HCT: 35.2 % — ABNORMAL LOW (ref 36.0–46.0)
Hemoglobin: 11.4 g/dL — ABNORMAL LOW (ref 12.0–15.0)
MCHC: 32.4 g/dL (ref 30.0–36.0)
MCV: 83.8 fl (ref 78.0–100.0)
Platelets: 260 10*3/uL (ref 150.0–400.0)
RBC: 4.2 Mil/uL (ref 3.87–5.11)
RDW: 15.7 % — ABNORMAL HIGH (ref 11.5–15.5)
WBC: 4.5 10*3/uL (ref 4.0–10.5)

## 2017-04-22 LAB — LIPID PANEL
CHOL/HDL RATIO: 6
Cholesterol: 188 mg/dL (ref 0–200)
HDL: 33.2 mg/dL — AB (ref >39.00)
LDL CALC: 129 mg/dL — AB (ref 0–99)
NONHDL: 155.29
Triglycerides: 132 mg/dL (ref 0.0–149.0)
VLDL: 26.4 mg/dL (ref 0.0–40.0)

## 2017-04-22 LAB — T4, FREE: FREE T4: 1.2 ng/dL (ref 0.60–1.60)

## 2017-04-22 LAB — HEMOGLOBIN A1C: HEMOGLOBIN A1C: 5.4 % (ref 4.6–6.5)

## 2017-04-22 LAB — TSH: TSH: 0.01 u[IU]/mL — ABNORMAL LOW (ref 0.35–4.50)

## 2017-04-22 NOTE — Progress Notes (Signed)
Subjective:    Patient ID: [name], female    DOB: May 26, 1971, 46 y.o.   MRN: [id number]  HPI  Pt presents to the clinic today for her annual exam.  Flu: never Tetanus: 07/2016 Pap Smear: 01/2016 Mammogram: 01/2016 Vision Screening: as needed Dentist: biannually  Diet: She does eat meat. She consumes fruits and veggies daily. She occasionally eats fried foods. She drinks mostly water. Exercise: She just started going to the gym this week, doing cardio for at least 30 minutes.    Review of Systems  Past Medical History:  Diagnosis Date  . Allergy   . Anxiety   . Frequent headaches   . Hypothyroidism     Current Outpatient Prescriptions  Medication Sig Dispense Refill  . ALPRAZolam (XANAX) 0.25 MG tablet Take 1 tablet (0.25 mg total) by mouth 2 (two) times daily as needed. 20 tablet 0  . AMETHIA 0.15-0.03 &0.01 MG tablet Take 1 tablet by mouth daily. 1 Package 0  . butalbital-acetaminophen-caffeine (FIORICET, ESGIC) 50-325-40 MG tablet TAKE 1 TO 2 TABLETS BY MOUTH EVERY 6 HOURS AS NEEDED FOR HEADACHE 20 tablet 0  . gabapentin (NEURONTIN) 100 MG capsule TAKE 1 CAPSULE (100 MG TOTAL) BY MOUTH 3 (THREE) TIMES DAILY. 30 capsule 2  . HYDROcodone-acetaminophen (NORCO/VICODIN) 5-325 MG tablet Take 1 tablet by mouth every 8 (eight) hours as needed for moderate pain. 15 tablet 0  . levocetirizine (XYZAL) 5 MG tablet TAKE 1 TABLET (5 MG TOTAL) BY MOUTH EVERY EVENING. 30 tablet 2  . levothyroxine (SYNTHROID, LEVOTHROID) 137 MCG tablet Take 1 tablet (137 mcg total) by mouth daily before breakfast. 30 tablet 0  . ondansetron (ZOFRAN-ODT) 8 MG disintegrating tablet Take 1 tablet (8 mg total) by mouth every 8 (eight) hours as needed for nausea. 20 tablet 0  . Prenatal Vit-Fe Fumarate-FA (PRENATAL MULTIVITAMIN) TABS tablet Take 1 tablet by mouth daily at 12 noon.    . sertraline (ZOLOFT) 50 MG tablet Take 1 tablet (50 mg total) by mouth daily. 30 tablet 0   No current  facility-administered medications for this visit.     Allergies  Allergen Reactions  . Ciprofloxacin     anxiety  . Keflex [Cephalexin] Hives  . Trazodone And Nefazodone Other (See Comments)    nightmares    Family History  Problem Relation Age of Onset  . Cancer Mother   . Depression Mother   . Cancer Father   . Hyperlipidemia Father     Social History   Social History  . Marital status: Single    Spouse name: N/A  . Number of children: N/A  . Years of education: N/A   Occupational History  . Not on file.   Social History Main Topics  . Smoking status: Never Smoker  . Smokeless tobacco: Never Used  . Alcohol use Yes     Comment: occasional  . Drug use: No  . Sexual activity: Yes    Partners: Male   Other Topics Concern  . Not on file   Social History Narrative  . No narrative on file     Constitutional: Denies fever, malaise, fatigue, headache or abrupt weight changes.  HEENT: Denies eye pain, eye redness, ear pain, ringing in the ears, wax buildup, runny nose, nasal congestion, bloody nose, or sore throat. Respiratory: Denies difficulty breathing, shortness of breath, cough or sputum production.   Cardiovascular: Denies chest pain, chest tightness, palpitations or swelling in the hands or feet.  Gastrointestinal: Denies abdominal pain, bloating, constipation,  diarrhea or blood in the stool.  GU: Denies urgency, frequency, pain with urination, burning sensation, blood in urine, odor or discharge. Musculoskeletal: Denies decrease in range of motion, difficulty with gait, muscle pain or joint pain and swelling.  Skin: Denies redness, rashes, lesions or ulcercations.  Neurological: Denies dizziness, difficulty with memory, difficulty with speech or problems with balance and coordination.  Psych: Pt reports intermittent anxiety. Denies depression, SI/HI.  No other specific complaints in a complete review of systems (except as listed in HPI above).       Objective:   Physical Exam    BP 132/88   Pulse 70   Temp 97.9 F (36.6 C) (Oral)   Ht 5\' 5"  (1.651 m)   Wt 202 lb (91.6 kg)   SpO2 98%   BMI 33.61 kg/m  Wt Readings from Last 3 Encounters:  04/22/17 202 lb (91.6 kg)  03/19/17 201 lb 8 oz (91.4 kg)  12/23/16 206 lb (93.4 kg)    General: Appears her stated age, well developed, obese in NAD. Skin: Warm, dry and intact.  HEENT: Head: normal shape and size; Eyes: sclera white, no icterus, conjunctiva pink, PERRLA and EOMs intact; Ears: Tm's gray and intact, normal light reflex; Throat/Mouth: Teeth present, mucosa pink and moist, no exudate, lesions or ulcerations noted.  Neck:  Neck supple, trachea midline. No masses, lumps or thyromegaly present.  Cardiovascular: Normal rate and rhythm. S1,S2 noted.  No murmur, rubs or gallops noted. No JVD or BLE edema. Pulmonary/Chest: Normal effort and positive vesicular breath sounds. No respiratory distress. No wheezes, rales or ronchi noted.  Abdomen: Soft and nontender. Normal bowel sounds. No distention or masses noted. Liver, spleen and kidneys non palpable. Musculoskeletal: Strength 5/5 BUE/BLE. No difficulty with gait.  Neurological: Alert and oriented. Cranial nerves II-XII grossly intact. Coordination normal.  Psychiatric: Mood and affect normal. Behavior is normal. Judgment and thought content normal.    BMET    Component Value Date/Time   NA 137 11/07/2015 1458   K 3.4 (L) 11/07/2015 1458   CL 102 11/07/2015 1458   CO2 30 11/07/2015 1458   GLUCOSE 91 11/07/2015 1458   BUN 14 11/07/2015 1458   CREATININE 0.99 11/07/2015 1458   CALCIUM 9.2 11/07/2015 1458    Lipid Panel     Component Value Date/Time   CHOL 213 (H) 11/07/2015 1458   TRIG 97.0 11/07/2015 1458   HDL 39.10 11/07/2015 1458   CHOLHDL 5 11/07/2015 1458   VLDL 19.4 11/07/2015 1458   LDLCALC 155 (H) 11/07/2015 1458    CBC    Component Value Date/Time   WBC 6.6 11/07/2015 1458   RBC 4.11 11/07/2015 1458    HGB 11.9 (L) 11/07/2015 1458   HCT 35.2 (L) 11/07/2015 1458   PLT 281.0 11/07/2015 1458   MCV 85.7 11/07/2015 1458   MCHC 33.9 11/07/2015 1458   RDW 13.8 11/07/2015 1458    Hgb A1C Lab Results  Component Value Date   HGBA1C 5.3 11/07/2015          Assessment & Plan:   Preventative Health Maintenance:  Encouraged her to get a flu shot in the fall Tetanus UTD Pap smear UTD, will request a record of this She is due for mammogram, has this scheduled for 06/2017 Encouraged her to consume a balanced diet and exercise regimen Advised her to see an eye doctor and dentist annually Will check CBC, CMET, Lipid, A1C, TSH, T3 and Free T4  RTC in 1 year, sooner if  needed Webb Silversmith, NP

## 2017-04-22 NOTE — Patient Instructions (Signed)

## 2017-04-23 LAB — T3: T3, Total: 149.5 ng/dL (ref 76–181)

## 2017-04-23 MED ORDER — LEVOTHYROXINE SODIUM 125 MCG PO TABS: 125.0000 ug | ORAL_TABLET | Freq: Every day | ORAL | 1 refills | Status: DC

## 2017-04-23 NOTE — Addendum Note (Signed)
Addended by: Lurlean Nanny on: 04/23/2017 05:15 PM   Modules accepted: Orders

## 2017-05-03 ENCOUNTER — Other Ambulatory Visit: Payer: Self-pay | Admitting: Internal Medicine

## 2017-05-04 ENCOUNTER — Other Ambulatory Visit: Payer: Self-pay | Admitting: Internal Medicine

## 2017-06-07 ENCOUNTER — Other Ambulatory Visit: Payer: Self-pay | Admitting: Internal Medicine

## 2017-06-07 DIAGNOSIS — G8929 Other chronic pain: Secondary | ICD-10-CM

## 2017-06-07 DIAGNOSIS — R519 Headache, unspecified: Secondary | ICD-10-CM

## 2017-06-07 DIAGNOSIS — R51 Headache: Principal | ICD-10-CM

## 2017-06-08 NOTE — Telephone Encounter (Signed)
Rx called in to pharmacy. 

## 2017-06-08 NOTE — Telephone Encounter (Signed)
Last filled 04/06/17... Please advise

## 2017-06-08 NOTE — Telephone Encounter (Signed)
Ok to phone in [employer]

## 2017-07-06 ENCOUNTER — Other Ambulatory Visit: Payer: Self-pay | Admitting: Internal Medicine

## 2017-07-12 ENCOUNTER — Other Ambulatory Visit: Payer: Self-pay | Admitting: Internal Medicine

## 2017-07-12 NOTE — Telephone Encounter (Signed)
Last filled 04/12/17... Please advise

## 2017-07-13 NOTE — Telephone Encounter (Signed)
Rx called in to pharmacy. 

## 2017-07-13 NOTE — Telephone Encounter (Signed)
Ok to phone in Xanax 

## 2017-08-02 ENCOUNTER — Telehealth: Payer: Self-pay

## 2017-08-02 NOTE — Telephone Encounter (Signed)
Pt notified as instructed per Avie Echevaria NP and pt voiced understanding.

## 2017-08-02 NOTE — Telephone Encounter (Signed)
I already have 20 patients on my schedule today. I will see her tomorrow. She can go to [facility] if needed before then.

## 2017-08-02 NOTE — Telephone Encounter (Signed)
Copied from [place]. Topic: Inquiry >> Aug 02, 2017  9:26 AM Pricilla Handler wrote: Reason for CRM: Patient called wanting to know if Rollene Fare would see her today as an extra patient. Patient wants Rollene Fare to give her a call today.

## 2017-08-02 NOTE — Telephone Encounter (Signed)
I spoke with pt and she has a h/a and hopes to work on 08/03/17. Pt wants to know if could be worked in today; advised pt [employer] NP does not have any available slots today and should keep appt on 08/03/17. Pt said if she does not get cb today she will keep appt on 08/03/17 but pt wants me to send note to Avie Echevaria NP to see if she will work pt in today.

## 2017-08-03 ENCOUNTER — Ambulatory Visit: Payer: BC Managed Care – PPO | Admitting: Internal Medicine

## 2017-08-03 ENCOUNTER — Encounter: Payer: Self-pay | Admitting: Internal Medicine

## 2017-08-03 VITALS — BP 130/84 | HR 81 | Temp 98.4°F | Wt 203.0 lb

## 2017-08-03 DIAGNOSIS — E039 Hypothyroidism, unspecified: Secondary | ICD-10-CM | POA: Diagnosis not present

## 2017-08-03 DIAGNOSIS — G44209 Tension-type headache, unspecified, not intractable: Secondary | ICD-10-CM | POA: Diagnosis not present

## 2017-08-03 LAB — TSH: TSH: 0.12 u[IU]/mL — ABNORMAL LOW (ref 0.35–4.50)

## 2017-08-03 MED ORDER — KETOROLAC TROMETHAMINE 30 MG/ML IJ SOLN
30.0000 mg | Freq: Once | INTRAMUSCULAR | Status: AC
  Administered 2017-08-03: 30 mg via INTRAMUSCULAR

## 2017-08-03 NOTE — Patient Instructions (Signed)
Tension Headache A tension headache is pain, pressure, or aching that is felt over the front and sides of your head. These headaches can last from 30 minutes to several days. Follow these instructions at home: Managing pain  Take over-the-counter and prescription medicines only as told by your doctor.  Lie down in a dark, quiet room when you have a headache.  If directed, apply ice to your head and neck area: ? Put ice in a plastic bag. ? Place a towel between your skin and the bag. ? Leave the ice on for 20 minutes, 2-3 times per day.  Use a heating pad or a hot shower to apply heat to your head and neck area as told by your doctor. Eating and drinking  Eat meals on a regular schedule.  Do not drink a lot of alcohol.  Do not use a lot of caffeine, or stop using caffeine. General instructions  Keep all follow-up visits as told by your doctor. This is important.  Keep a journal to find out if certain things bring on headaches. For example, write down: ? What you eat and drink. ? How much sleep you get. ? Any change to your diet or medicines.  Try getting a massage, or doing other things that help you to relax.  Lessen stress.  Sit up straight. Do not tighten (tense) your muscles.  Do not use tobacco products. This includes cigarettes, chewing tobacco, or e-cigarettes. If you need help quitting, ask your doctor.  Exercise regularly as told by your doctor.  Get enough sleep. This may mean 7-9 hours of sleep. Contact a doctor if:  Your symptoms are not helped by medicine.  You have a headache that feels different from your usual headache.  You feel sick to your stomach (nauseous) or you throw up (vomit).  You have a fever. Get help right away if:  Your headache becomes very bad.  You keep throwing up.  You have a stiff neck.  You have trouble seeing.  You have trouble speaking.  You have pain in your eye or ear.  Your muscles are weak or you lose muscle  control.  You lose your balance or you have trouble walking.  You feel like you will pass out (faint) or you pass out.  You have confusion. This information is not intended to replace advice given to you by your health care provider. Make sure you discuss any questions you have with your health care provider. Document Released: 11/18/2009 Document Revised: 04/23/2016 Document Reviewed: 12/17/2014 Elsevier Interactive Patient Education  2018 Elsevier Inc.  

## 2017-08-03 NOTE — Progress Notes (Addendum)
Subjective:    Patient ID: [name], female    DOB: March 08, 1971, 46 y.o.   MRN: [id number]  HPI  Pt presents to the clinic today with c/o headache. She reports this started 3 days ago. The pain is located in the back of her skull. She describes the pain as pounding. She has had some blurry vision, almost like there is a film over her eyes. She has had some associated nausea but denies vomiting, dizziness or syncope. She denies any neck pain or injury to the neck. She denies pain in her arms. She has tried Fioricet with minimal relief.   We are due to repeat TSH today. At her last visit, TSH was high, we increase her Synthroid to 125 mcg daily. She has been taking the medication as prescribed.  Review of Systems      Past Medical History:  Diagnosis Date  . Allergy   . Anxiety   . Frequent headaches   . Hypothyroidism     Current Outpatient Medications  Medication Sig Dispense Refill  . ALPRAZolam (XANAX) 0.25 MG tablet TAKE 1 TABLET BY MOUTH TWICE DAILY AS NEEDED 20 tablet 0  . AMETHIA 0.15-0.03 &0.01 MG tablet Take 1 tablet by mouth daily. 1 Package 0  . butalbital-acetaminophen-caffeine (FIORICET, ESGIC) 50-325-40 MG tablet TAKE 1 TO 2 TABLETS BY MOUTH EVERY 6 HOURS AS NEEDED FOR FOR HEADACHE 20 tablet 0  . gabapentin (NEURONTIN) 100 MG capsule TAKE 1 CAPSULE (100 MG TOTAL) BY MOUTH 3 (THREE) TIMES DAILY. 30 capsule 2  . levocetirizine (XYZAL) 5 MG tablet TAKE 1 TABLET (5 MG TOTAL) BY MOUTH EVERY EVENING. 30 tablet 2  . levothyroxine (SYNTHROID, LEVOTHROID) 125 MCG tablet Take 1 tablet (125 mcg total) by mouth daily. MUST SCHEDULE LAB ONLY VISIT 30 tablet 0  . Prenatal Vit-Fe Fumarate-FA (PRENATAL MULTIVITAMIN) TABS tablet Take 1 tablet by mouth daily at 12 noon.    . sertraline (ZOLOFT) 50 MG tablet TAKE 1 TABLET BY MOUTH EVERY DAY 30 tablet 6   No current facility-administered medications for this visit.     Allergies  Allergen Reactions  . Ciprofloxacin    anxiety  . Keflex [Cephalexin] Hives  . Trazodone And Nefazodone Other (See Comments)    nightmares    Family History  Problem Relation Age of Onset  . Cancer Mother   . Depression Mother   . Cancer Father   . Hyperlipidemia Father     Social History   Socioeconomic History  . Marital status: Single    Spouse name: Not on file  . Number of children: Not on file  . Years of education: Not on file  . Highest education level: Not on file  Social Needs  . Financial resource strain: Not on file  . Food insecurity - worry: Not on file  . Food insecurity - inability: Not on file  . Transportation needs - medical: Not on file  . Transportation needs - non-medical: Not on file  Occupational History  . Not on file  Tobacco Use  . Smoking status: Never Smoker  . Smokeless tobacco: Never Used  Substance and Sexual Activity  . Alcohol use: Yes    Comment: occasional  . Drug use: No  . Sexual activity: Yes    Partners: Male  Other Topics Concern  . Not on file  Social History Narrative  . Not on file     Constitutional: Pt reports headache. Denies fever, malaise, fatigue, or abrupt weight changes.  HEENT:  Pt reports blurry vision. Denies eye pain, eye redness, ear pain, ringing in the ears, wax buildup, runny nose, nasal congestion, bloody nose, or sore throat. Musculoskeletal: Denies decrease in range of motion, difficulty with gait, muscle pain or joint pain and swelling.  Neurological: Denies dizziness, difficulty with memory, difficulty with speech or problems with balance and coordination.    No other specific complaints in a complete review of systems (except as listed in HPI above).  Objective:   Physical Exam   BP 130/84   Pulse 81   Temp 98.4 F (36.9 C) (Oral)   Wt 203 lb (92.1 kg)   SpO2 98%   BMI 33.78 kg/m  Wt Readings from Last 3 Encounters:  08/03/17 203 lb (92.1 kg)  04/22/17 202 lb (91.6 kg)  03/19/17 201 lb 8 oz (91.4 kg)    General: Appears  her stated age, obese in NAD. HEENT: Eyes: PERRLA and EOMs intact, positive red reflex;  Neck:  Neck supple, trachea midline. No masses, lumps present.  Musculoskeletal: Normal flexion, extension, rotation and lateral bending of the cervical spine. No bony tenderness noted over the cervical spine.  Neurological: Alert and oriented. Coordination normal.    BMET    Component Value Date/Time   NA 137 04/22/2017 1020   K 4.1 04/22/2017 1020   CL 103 04/22/2017 1020   CO2 27 04/22/2017 1020   GLUCOSE 85 04/22/2017 1020   BUN 11 04/22/2017 1020   CREATININE 0.85 04/22/2017 1020   CALCIUM 9.2 04/22/2017 1020    Lipid Panel     Component Value Date/Time   CHOL 188 04/22/2017 1020   TRIG 132.0 04/22/2017 1020   HDL 33.20 (L) 04/22/2017 1020   CHOLHDL 6 04/22/2017 1020   VLDL 26.4 04/22/2017 1020   LDLCALC 129 (H) 04/22/2017 1020    CBC    Component Value Date/Time   WBC 4.5 04/22/2017 1020   RBC 4.20 04/22/2017 1020   HGB 11.4 (L) 04/22/2017 1020   HCT 35.2 (L) 04/22/2017 1020   PLT 260.0 04/22/2017 1020   MCV 83.8 04/22/2017 1020   MCHC 32.4 04/22/2017 1020   RDW 15.7 (H) 04/22/2017 1020    Hgb A1C Lab Results  Component Value Date   HGBA1C 5.4 04/22/2017           Assessment & Plan:   Tension Headache:  Snellen chart 20/20 vision bilaterally Toradol 30 mg IM today Can take your next dose of NSAID's in 8 hours Can also try 1000 mg of Tylenol  Hypothyroidism:  Repeat TSH today  Return precautions discussed Webb Silversmith, NP

## 2017-08-03 NOTE — Addendum Note (Signed)
Addended by: Lurlean Nanny on: 08/03/2017 11:47 AM   Modules accepted: Orders

## 2017-08-09 ENCOUNTER — Other Ambulatory Visit: Payer: Self-pay | Admitting: Internal Medicine

## 2017-08-10 ENCOUNTER — Other Ambulatory Visit: Payer: Self-pay | Admitting: Internal Medicine

## 2017-08-10 DIAGNOSIS — R51 Headache: Principal | ICD-10-CM

## 2017-08-10 DIAGNOSIS — R519 Headache, unspecified: Secondary | ICD-10-CM

## 2017-08-10 DIAGNOSIS — G8929 Other chronic pain: Secondary | ICD-10-CM

## 2017-08-11 ENCOUNTER — Encounter: Payer: Self-pay | Admitting: Internal Medicine

## 2017-08-11 MED ORDER — LEVOTHYROXINE SODIUM 100 MCG PO TABS: 100.0000 ug | ORAL_TABLET | Freq: Every day | ORAL | 0 refills | Status: DC

## 2017-08-11 NOTE — Telephone Encounter (Signed)
See phone note. This should have been decreased and sent in to pharmacy.

## 2017-08-11 NOTE — Telephone Encounter (Signed)
Fioricet last filled 06/08/2017... Please advise

## 2017-08-11 NOTE — Telephone Encounter (Signed)
Pt is aware via mychart...  Your thyroid labs are still abnormal. I will send in Synthroid 135mcg and you will have to come back for a lab only appointment in 1 month    Last read by [name] at 4:54 PM on 08/11/2017.

## 2017-08-12 ENCOUNTER — Other Ambulatory Visit: Payer: Self-pay | Admitting: Internal Medicine

## 2017-08-12 DIAGNOSIS — G8929 Other chronic pain: Secondary | ICD-10-CM

## 2017-08-12 DIAGNOSIS — R519 Headache, unspecified: Secondary | ICD-10-CM

## 2017-08-12 DIAGNOSIS — R51 Headache: Principal | ICD-10-CM

## 2017-08-12 MED ORDER — LEVOTHYROXINE SODIUM 50 MCG PO TABS: 100.0000 ug | ORAL_TABLET | Freq: Every day | ORAL | 0 refills | Status: DC

## 2017-08-12 MED ORDER — BUTALBITAL-APAP-CAFFEINE 50-325-40 MG PO TABS: 1.0000 | ORAL_TABLET | Freq: Four times a day (QID) | ORAL | 2 refills | Status: DC | PRN

## 2017-08-12 NOTE — Telephone Encounter (Signed)
Ok to phone in Fioricet 

## 2017-08-12 NOTE — Telephone Encounter (Signed)
Rx called in to pharmacy. 

## 2017-08-26 ENCOUNTER — Other Ambulatory Visit: Payer: Self-pay | Admitting: Internal Medicine

## 2017-08-26 NOTE — Telephone Encounter (Signed)
Last filled 07/13/17... Please advise

## 2017-08-27 ENCOUNTER — Encounter: Payer: Self-pay | Admitting: Internal Medicine

## 2017-08-27 ENCOUNTER — Other Ambulatory Visit: Payer: Self-pay | Admitting: Internal Medicine

## 2017-08-27 MED ORDER — ALPRAZOLAM 0.25 MG PO TABS: 0.2500 mg | ORAL_TABLET | Freq: Two times a day (BID) | ORAL | 0 refills | Status: DC | PRN

## 2017-08-27 NOTE — Telephone Encounter (Signed)
Last filled 04/2017... Please advise if okay to refill

## 2017-08-27 NOTE — Telephone Encounter (Signed)
Rx called in to pharmacy. 

## 2017-08-27 NOTE — Addendum Note (Signed)
Addended by: Lurlean Nanny on: 08/27/2017 12:11 PM   Modules accepted: Orders

## 2017-08-27 NOTE — Telephone Encounter (Signed)
Ok to phone in Xanax 

## 2017-09-09 ENCOUNTER — Other Ambulatory Visit: Payer: Self-pay | Admitting: Internal Medicine

## 2017-09-09 MED ORDER — LEVOTHYROXINE SODIUM 100 MCG PO TABS: 100.0000 ug | ORAL_TABLET | Freq: Every day | ORAL | 0 refills | Status: DC

## 2017-10-04 ENCOUNTER — Encounter: Payer: Self-pay | Admitting: Internal Medicine

## 2017-10-05 ENCOUNTER — Other Ambulatory Visit (INDEPENDENT_AMBULATORY_CARE_PROVIDER_SITE_OTHER): Payer: BC Managed Care – PPO

## 2017-10-05 ENCOUNTER — Other Ambulatory Visit: Payer: Self-pay | Admitting: Internal Medicine

## 2017-10-05 DIAGNOSIS — E039 Hypothyroidism, unspecified: Secondary | ICD-10-CM | POA: Diagnosis not present

## 2017-10-05 LAB — T4, FREE: FREE T4: 1.1 ng/dL (ref 0.60–1.60)

## 2017-10-05 LAB — TSH: TSH: 0.03 u[IU]/mL — AB (ref 0.35–4.50)

## 2017-10-05 MED ORDER — LEVOTHYROXINE SODIUM 50 MCG PO TABS: 50.0000 ug | ORAL_TABLET | Freq: Every day | ORAL | 1 refills | Status: DC

## 2017-11-04 ENCOUNTER — Other Ambulatory Visit: Payer: Self-pay | Admitting: Internal Medicine

## 2017-11-08 ENCOUNTER — Other Ambulatory Visit: Payer: Self-pay | Admitting: Internal Medicine

## 2017-11-09 ENCOUNTER — Other Ambulatory Visit (INDEPENDENT_AMBULATORY_CARE_PROVIDER_SITE_OTHER): Payer: BC Managed Care – PPO

## 2017-11-09 DIAGNOSIS — E039 Hypothyroidism, unspecified: Secondary | ICD-10-CM | POA: Diagnosis not present

## 2017-11-09 LAB — TSH: TSH: 7.33 u[IU]/mL — AB (ref 0.35–4.50)

## 2017-11-09 NOTE — Telephone Encounter (Signed)
Last filled 08/27/17... Please advise

## 2017-11-10 ENCOUNTER — Encounter: Payer: Self-pay | Admitting: Internal Medicine

## 2017-11-10 DIAGNOSIS — E039 Hypothyroidism, unspecified: Secondary | ICD-10-CM

## 2017-11-10 MED ORDER — LEVOTHYROXINE SODIUM 75 MCG PO TABS: 75.0000 ug | ORAL_TABLET | Freq: Every day | ORAL | 1 refills | Status: DC

## 2017-11-19 ENCOUNTER — Ambulatory Visit (INDEPENDENT_AMBULATORY_CARE_PROVIDER_SITE_OTHER)
Admission: RE | Admit: 2017-11-19 | Discharge: 2017-11-19 | Disposition: A | Discharge status: Home / Self Care | Payer: BC Managed Care – PPO | Source: Ambulatory Visit | Attending: Internal Medicine | Admitting: Internal Medicine

## 2017-11-19 ENCOUNTER — Ambulatory Visit (INDEPENDENT_AMBULATORY_CARE_PROVIDER_SITE_OTHER): Payer: BC Managed Care – PPO | Admitting: Internal Medicine

## 2017-11-19 ENCOUNTER — Encounter: Payer: Self-pay | Admitting: Internal Medicine

## 2017-11-19 VITALS — BP 150/90 | HR 117 | Temp 100.0°F | Ht 65.0 in | Wt 196.0 lb

## 2017-11-19 DIAGNOSIS — J101 Influenza due to other identified influenza virus with other respiratory manifestations: Secondary | ICD-10-CM | POA: Diagnosis not present

## 2017-11-19 LAB — POC INFLUENZA A&B (BINAX/QUICKVUE)
Influenza A, POC: POSITIVE — AB
Influenza B, POC: NEGATIVE

## 2017-11-19 MED ORDER — OSELTAMIVIR PHOSPHATE 75 MG PO CAPS: 75.0000 mg | ORAL_CAPSULE | Freq: Two times a day (BID) | ORAL | 0 refills | Status: DC

## 2017-11-19 MED ORDER — BENZONATATE 200 MG PO CAPS: 200.0000 mg | ORAL_CAPSULE | Freq: Three times a day (TID) | ORAL | 0 refills | Status: DC | PRN

## 2017-11-19 NOTE — Progress Notes (Signed)
Subjective:    Patient ID: [name], female    DOB: May 24, 1971, 47 y.o.   MRN: [id number]  HPI Here due to fever  Started under 2 days ago--sudden  Temp up to 102---tylenol not even bringing it down Bad cough--gets SOB Bad sweats Achy and feels terrible Chest wall pain  Tried ibuprofen also Couldn't tolerate cold and flu med  Current Outpatient Medications on File Prior to Visit  Medication Sig Dispense Refill  . ALPRAZolam (XANAX) 0.25 MG tablet TAKE 1 TABLET BY MOUTH TWICE DAILY AS NEEDED 20 tablet 0  . AMETHIA 0.15-0.03 &0.01 MG tablet Take 1 tablet by mouth daily. 1 Package 0  . butalbital-acetaminophen-caffeine (FIORICET, ESGIC) 50-325-40 MG tablet Take 1 tablet by mouth every 6 (six) hours as needed for headache. 20 tablet 2  . gabapentin (NEURONTIN) 100 MG capsule TAKE 1 CAPSULE (100 MG TOTAL) BY MOUTH 3 (THREE) TIMES DAILY. 30 capsule 2  . levocetirizine (XYZAL) 5 MG tablet TAKE 1 TABLET (5 MG TOTAL) BY MOUTH EVERY EVENING. 30 tablet 2  . levothyroxine (SYNTHROID, LEVOTHROID) 75 MCG tablet Take 1 tablet (75 mcg total) by mouth daily. 30 tablet 1  . Prenatal Vit-Fe Fumarate-FA (PRENATAL MULTIVITAMIN) TABS tablet Take 1 tablet by mouth daily at 12 noon.    . sertraline (ZOLOFT) 50 MG tablet TAKE 1 TABLET BY MOUTH EVERY DAY 30 tablet 6   No current facility-administered medications on file prior to visit.     Allergies  Allergen Reactions  . Ciprofloxacin     anxiety  . Keflex [Cephalexin] Hives  . Trazodone And Nefazodone Other (See Comments)    nightmares    Past Medical History:  Diagnosis Date  . Allergy   . Anxiety   . Frequent headaches   . Hypothyroidism     Past Surgical History:  Procedure Laterality Date  . APPENDECTOMY  2013  . lapband  2013  . TONSILLECTOMY  2012    Family History  Problem Relation Age of Onset  . Cancer Mother   . Depression Mother   . Cancer Father   . Hyperlipidemia Father     Social History    Socioeconomic History  . Marital status: Single    Spouse name: Not on file  . Number of children: Not on file  . Years of education: Not on file  . Highest education level: Not on file  Social Needs  . Financial resource strain: Not on file  . Food insecurity - worry: Not on file  . Food insecurity - inability: Not on file  . Transportation needs - medical: Not on file  . Transportation needs - non-medical: Not on file  Occupational History  . Not on file  Tobacco Use  . Smoking status: Never Smoker  . Smokeless tobacco: Never Used  Substance and Sexual Activity  . Alcohol use: Yes    Comment: occasional  . Drug use: No  . Sexual activity: Yes    Partners: Male  Other Topics Concern  . Not on file  Social History Narrative  . 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  Tender bilateral cervical nodes  Pulmonary/Chest:  Effort normal and breath sounds normal. No respiratory distress. She has no wheezes. She has no rales.          Assessment & Plan:

## 2017-11-19 NOTE — Assessment & Plan Note (Addendum)
Classic presentation and highly positive rapid test tachypneic though lungs clear Will check CXR--shows lingular atelectasis vs pneumonia---will ask for stat reading Radiologist didn't feel there was pneumonia  Will give tamiflu Analgesics OOW till ?Tuesday

## 2017-12-05 ENCOUNTER — Other Ambulatory Visit: Payer: Self-pay | Admitting: Internal Medicine

## 2017-12-06 ENCOUNTER — Other Ambulatory Visit: Payer: Self-pay | Admitting: Internal Medicine

## 2017-12-13 ENCOUNTER — Other Ambulatory Visit (INDEPENDENT_AMBULATORY_CARE_PROVIDER_SITE_OTHER): Payer: BC Managed Care – PPO

## 2017-12-13 DIAGNOSIS — E039 Hypothyroidism, unspecified: Secondary | ICD-10-CM

## 2017-12-13 LAB — TSH: TSH: 2.83 u[IU]/mL (ref 0.35–4.50)

## 2018-01-03 ENCOUNTER — Other Ambulatory Visit: Payer: Self-pay | Admitting: Internal Medicine

## 2018-01-03 NOTE — Telephone Encounter (Signed)
Please Advise

## 2018-01-06 ENCOUNTER — Other Ambulatory Visit: Payer: Self-pay | Admitting: Internal Medicine

## 2018-01-16 ENCOUNTER — Encounter: Payer: Self-pay | Admitting: Internal Medicine

## 2018-01-17 MED ORDER — SERTRALINE HCL 100 MG PO TABS
100.0000 mg | ORAL_TABLET | Freq: Every day | ORAL | 3 refills | Status: DC
Start: 2018-01-17 — End: 2018-03-23

## 2018-02-02 ENCOUNTER — Other Ambulatory Visit: Payer: Self-pay | Admitting: Internal Medicine

## 2018-02-14 ENCOUNTER — Other Ambulatory Visit: Payer: Self-pay | Admitting: Internal Medicine

## 2018-02-14 NOTE — Telephone Encounter (Signed)
Electronic refill request Last office visit 11/19/17/acute Last refill 08/08/17 #30/2

## 2018-02-28 ENCOUNTER — Other Ambulatory Visit: Payer: Self-pay | Admitting: Internal Medicine

## 2018-02-28 ENCOUNTER — Encounter: Payer: Self-pay | Admitting: Internal Medicine

## 2018-03-01 MED ORDER — LEVOTHYROXINE SODIUM 75 MCG PO TABS: 75.0000 ug | ORAL_TABLET | Freq: Every day | ORAL | 1 refills | Status: DC

## 2018-03-01 MED ORDER — ALPRAZOLAM 0.25 MG PO TABS: 0.2500 mg | ORAL_TABLET | Freq: Two times a day (BID) | ORAL | 0 refills | Status: DC | PRN

## 2018-03-01 NOTE — Telephone Encounter (Signed)
Xanax last filled 01/03/18 #20... Please advise

## 2018-03-23 ENCOUNTER — Other Ambulatory Visit: Payer: Self-pay | Admitting: Internal Medicine

## 2018-04-08 ENCOUNTER — Other Ambulatory Visit: Payer: Self-pay | Admitting: Internal Medicine

## 2018-04-08 MED ORDER — LEVOCETIRIZINE DIHYDROCHLORIDE 5 MG PO TABS: ORAL_TABLET | ORAL | 0 refills | Status: DC

## 2018-04-08 MED ORDER — SERTRALINE HCL 100 MG PO TABS: ORAL_TABLET | ORAL | 0 refills | Status: DC

## 2018-04-09 ENCOUNTER — Other Ambulatory Visit: Payer: Self-pay | Admitting: Internal Medicine

## 2018-04-24 ENCOUNTER — Encounter: Payer: Self-pay | Admitting: Internal Medicine

## 2018-04-26 ENCOUNTER — Ambulatory Visit (INDEPENDENT_AMBULATORY_CARE_PROVIDER_SITE_OTHER): Payer: BC Managed Care – PPO | Admitting: Internal Medicine

## 2018-04-26 ENCOUNTER — Encounter: Payer: Self-pay | Admitting: Internal Medicine

## 2018-04-26 VITALS — BP 130/90 | HR 71 | Temp 98.7°F | Wt 190.0 lb

## 2018-04-26 DIAGNOSIS — G43009 Migraine without aura, not intractable, without status migrainosus: Secondary | ICD-10-CM | POA: Diagnosis not present

## 2018-04-26 DIAGNOSIS — R51 Headache: Secondary | ICD-10-CM

## 2018-04-26 DIAGNOSIS — R110 Nausea: Secondary | ICD-10-CM | POA: Diagnosis not present

## 2018-04-26 MED ORDER — KETOROLAC TROMETHAMINE 60 MG/2ML IM SOLN
60.0000 mg | Freq: Once | INTRAMUSCULAR | Status: AC
  Administered 2018-04-26: 60 mg via INTRAMUSCULAR

## 2018-04-26 MED ORDER — PROMETHAZINE HCL 25 MG/ML IJ SOLN
25.0000 mg | Freq: Once | INTRAMUSCULAR | Status: AC
  Administered 2018-04-26: 25 mg via INTRAMUSCULAR

## 2018-04-26 NOTE — Patient Instructions (Signed)

## 2018-04-26 NOTE — Progress Notes (Signed)
Subjective:    Patient ID: [name], female    DOB: 08/01/1971, 47 y.o.   MRN: [id number]  HPI  Pt presents to the clinic today with c/o migraine. She reports this started 1 week ago. The pain is located in the top of her head. She describes the pain as throbbing. She reports associated sensitivity to light and nausea. She denies dizziness, visual changes, sensitivity to sound or vomiting. She has tried Fioricet and Tylenol with minimal relief.   Review of Systems      Past Medical History:  Diagnosis Date  . Allergy   . Anxiety   . Frequent headaches   . Hypothyroidism     Current Outpatient Medications  Medication Sig Dispense Refill  . ALPRAZolam (XANAX) 0.25 MG tablet Take 1 tablet (0.25 mg total) by mouth 2 (two) times daily as needed. 20 tablet 0  . AMETHIA 0.15-0.03 &0.01 MG tablet Take 1 tablet by mouth daily. 1 Package 0  . butalbital-acetaminophen-caffeine (FIORICET, ESGIC) 50-325-40 MG tablet Take 1 tablet by mouth every 6 (six) hours as needed for headache. 20 tablet 2  . gabapentin (NEURONTIN) 100 MG capsule TAKE 1 CAPSULE (100 MG TOTAL) BY MOUTH 3 (THREE) TIMES DAILY. 30 capsule 2  . levocetirizine (XYZAL) 5 MG tablet TAKE 1 TABLET (5 MG TOTAL) BY MOUTH EVERY EVENING. 90 tablet 0  . levothyroxine (SYNTHROID, LEVOTHROID) 75 MCG tablet TAKE 1 TABLET (75 MCG TOTAL) BY MOUTH DAILY. MUST SCHEDULE ANNUAL PHYSICAL 90 tablet 0  . Prenatal Vit-Fe Fumarate-FA (PRENATAL MULTIVITAMIN) TABS tablet Take 1 tablet by mouth daily at 12 noon.    . sertraline (ZOLOFT) 100 MG tablet TAKE 1 TABLET (100 MG TOTAL) BY MOUTH DAILY. MUST SCHEDULE ANNUAL EXAM 90 tablet 0   No current facility-administered medications for this visit.     Allergies  Allergen Reactions  . Ciprofloxacin     anxiety  . Keflex [Cephalexin] Hives  . Trazodone And Nefazodone Other (See Comments)    nightmares    Family History  Problem Relation Age of Onset  . Cancer Mother   . Depression Mother     . Cancer Father   . Hyperlipidemia Father     Social History   Socioeconomic History  . Marital status: Single    Spouse name: Not on file  . Number of children: Not on file  . Years of education: Not on file  . Highest education level: Not on file  Occupational History  . Not on file  Social Needs  . Financial resource strain: Not on file  . Food insecurity:    Worry: Not on file    Inability: Not on file  . Transportation needs:    Medical: Not on file    Non-medical: Not on file  Tobacco Use  . Smoking status: Never Smoker  . Smokeless tobacco: Never Used  Substance and Sexual Activity  . Alcohol use: Yes    Comment: occasional  . Drug use: No  . Sexual activity: Yes    Partners: Male  Lifestyle  . Physical activity:    Days per week: Not on file    Minutes per session: Not on file  . Stress: Not on file  Relationships  . Social connections:    Talks on phone: Not on file    Gets together: Not on file    Attends religious service: Not on file    Active member of club or organization: Not on file    Attends meetings  of clubs or organizations: Not on file    Relationship status: Not on file  . Intimate partner violence:    Fear of current or ex partner: Not on file    Emotionally abused: Not on file    Physically abused: Not on file    Forced sexual activity: Not on file  Other Topics Concern  . Not on file  Social History Narrative  . Not on file     Constitutional: Pt reports headache. Denies fever, malaise, fatigue, or abrupt weight changes.  HEENT: Pt reports sensitivity to light. Denies eye pain, eye redness, ear pain, ringing in the ears, wax buildup, runny nose, nasal congestion, bloody nose, or sore throat. Gastrointestinal: Pt reports nausea. Denies abdominal pain, bloating, constipation, diarrhea or blood in the stool.  Neurological: Denies dizziness, difficulty with memory, difficulty with speech or problems with balance and coordination.     No other specific complaints in a complete review of systems (except as listed in HPI above).  Objective:   Physical Exam  BP 130/90   Pulse 71   Temp 98.7 F (37.1 C) (Oral)   Wt 190 lb (86.2 kg)   SpO2 98%   BMI 31.62 kg/m  Wt Readings from Last 3 Encounters:  04/26/18 190 lb (86.2 kg)  11/19/17 196 lb (88.9 kg)  08/03/17 203 lb (92.1 kg)    General: Appears her stated age, well developed, well nourished in NAD. HEENT: Eyes: PERRLA and EOMs intact, no horizontal nystagmus;  Musculoskeletal: Normal flexion, extension and rotation of the cervical spine. No bony tenderness noted over the spine. Neurological: Alert and oriented. Coordination normal.   BMET    Component Value Date/Time   NA 137 04/22/2017 1020   K 4.1 04/22/2017 1020   CL 103 04/22/2017 1020   CO2 27 04/22/2017 1020   GLUCOSE 85 04/22/2017 1020   BUN 11 04/22/2017 1020   CREATININE 0.85 04/22/2017 1020   CALCIUM 9.2 04/22/2017 1020    Lipid Panel     Component Value Date/Time   CHOL 188 04/22/2017 1020   TRIG 132.0 04/22/2017 1020   HDL 33.20 (L) 04/22/2017 1020   CHOLHDL 6 04/22/2017 1020   VLDL 26.4 04/22/2017 1020   LDLCALC 129 (H) 04/22/2017 1020    CBC    Component Value Date/Time   WBC 4.5 04/22/2017 1020   RBC 4.20 04/22/2017 1020   HGB 11.4 (L) 04/22/2017 1020   HCT 35.2 (L) 04/22/2017 1020   PLT 260.0 04/22/2017 1020   MCV 83.8 04/22/2017 1020   MCHC 32.4 04/22/2017 1020   RDW 15.7 (H) 04/22/2017 1020    Hgb A1C Lab Results  Component Value Date   HGBA1C 5.4 04/22/2017            Assessment & Plan:   Acute Non Intractable Migraine without Aura, without Status Migrainosus:  Toradol 60 mg IM today Promethazine 25 mg IM today Get some rest and drink plenty of fluids Update me if not improving Ok to continue Fioricet/Tylenol  Return/ER precautions discussed Webb Silversmith, NP

## 2018-04-27 ENCOUNTER — Encounter: Payer: Self-pay | Admitting: Internal Medicine

## 2018-04-28 MED ORDER — NAPROXEN 500 MG PO TABS: 500.0000 mg | ORAL_TABLET | Freq: Three times a day (TID) | ORAL | 0 refills | Status: DC

## 2018-04-28 MED ORDER — CYCLOBENZAPRINE HCL 10 MG PO TABS: 10.0000 mg | ORAL_TABLET | Freq: Three times a day (TID) | ORAL | 0 refills | Status: DC | PRN

## 2018-05-13 ENCOUNTER — Other Ambulatory Visit: Payer: Self-pay

## 2018-05-13 MED ORDER — NAPROXEN 500 MG PO TABS: 500.0000 mg | ORAL_TABLET | Freq: Two times a day (BID) | ORAL | 0 refills | Status: DC

## 2018-05-16 ENCOUNTER — Encounter: Payer: Self-pay | Admitting: Internal Medicine

## 2018-05-23 ENCOUNTER — Encounter: Payer: Self-pay | Admitting: Family Medicine

## 2018-05-23 ENCOUNTER — Ambulatory Visit: Payer: BC Managed Care – PPO | Admitting: Family Medicine

## 2018-05-23 ENCOUNTER — Ambulatory Visit: Payer: Self-pay | Admitting: Internal Medicine

## 2018-05-23 VITALS — BP 148/90 | HR 89 | Temp 98.0°F | Ht 65.0 in | Wt 188.8 lb

## 2018-05-23 DIAGNOSIS — H5712 Ocular pain, left eye: Secondary | ICD-10-CM | POA: Diagnosis not present

## 2018-05-23 NOTE — Progress Notes (Signed)
   Subjective:    Patient ID: [name], female    DOB: Oct 06, 1970, 47 y.o.   MRN: [id number]  HPI This is a 47 yo female who presents today with left eye pain. She was seen at [facility] and given an eye drop. The redness improved. But continues to have pain in inner corner of eye that goes back. Sometimes pain is worse than other times. Some improvement with closing eye. Blurred vision. Has history of migraines, this does not feel like her migraine.   Past Medical History:  Diagnosis Date  . Allergy   . Anxiety   . Frequent headaches   . Hypothyroidism    Past Surgical History:  Procedure Laterality Date  . APPENDECTOMY  2013  . lapband  2013  . TONSILLECTOMY  2012   Family History  Problem Relation Age of Onset  . Cancer Mother   . Depression Mother   . Cancer Father   . Hyperlipidemia Father    Social History   Tobacco Use  . Smoking status: Never Smoker  . Smokeless tobacco: Never Used  Substance Use Topics  . Alcohol use: Yes    Comment: occasional  . Drug use: No        Review of Systems Per HPI    Objective:   Physical Exam  Constitutional: She appears well-developed and well-nourished. No distress.  Appears uncomfortable.   HENT:  Head: Normocephalic and atraumatic.  Eyes: Pupils are equal, round, and reactive to light. Conjunctivae and EOM are normal.  Cardiovascular: Normal rate.  Pulmonary/Chest: Effort normal.  Skin: Skin is warm and dry. She is not diaphoretic.  Psychiatric: She has a normal mood and affect. Her behavior is normal. Judgment and thought content normal.  Vitals reviewed.     BP (!) 148/90 (BP Location: Right Arm, Patient Position: Sitting, Cuff Size: Large)   Pulse 89   Temp 98 F (36.7 C) (Oral)   Ht 5\' 5"  (1.651 m)   Wt 188 lb 12.8 oz (85.6 kg)   SpO2 98%   BMI 31.42 kg/m  Wt Readings from Last 3 Encounters:  05/23/18 188 lb 12.8 oz (85.6 kg)  04/26/18 190 lb (86.2 kg)  11/19/17 196 lb (88.9 kg)   Left eye  acuity- 20/50    Assessment & Plan:  1. Left eye pain - given severity of symptoms, patient was given urgent referral and appointment for ophthalmology. She was sent straight to [facility] - Ambulatory referral to Ophthalmology   Clarene Reamer, FNP-BC  [facility]  05/23/2018 3:51 PM

## 2018-05-30 ENCOUNTER — Other Ambulatory Visit: Payer: Self-pay | Admitting: Internal Medicine

## 2018-05-30 ENCOUNTER — Encounter: Payer: Self-pay | Admitting: Internal Medicine

## 2018-05-31 MED ORDER — ALPRAZOLAM 0.25 MG PO TABS: 0.2500 mg | ORAL_TABLET | Freq: Two times a day (BID) | ORAL | 0 refills | Status: DC | PRN

## 2018-05-31 NOTE — Telephone Encounter (Signed)
Last filled 03/01/18... Please advise... Note to pt to schedule CPE

## 2018-06-03 ENCOUNTER — Encounter: Payer: Self-pay | Admitting: Internal Medicine

## 2018-06-03 ENCOUNTER — Ambulatory Visit (INDEPENDENT_AMBULATORY_CARE_PROVIDER_SITE_OTHER): Payer: BC Managed Care – PPO | Admitting: Internal Medicine

## 2018-06-03 VITALS — BP 130/84 | HR 80 | Temp 98.2°F | Ht 65.0 in | Wt 186.0 lb

## 2018-06-03 DIAGNOSIS — Z0000 Encounter for general adult medical examination without abnormal findings: Secondary | ICD-10-CM

## 2018-06-03 DIAGNOSIS — R51 Headache: Secondary | ICD-10-CM | POA: Diagnosis not present

## 2018-06-03 DIAGNOSIS — E559 Vitamin D deficiency, unspecified: Secondary | ICD-10-CM | POA: Diagnosis not present

## 2018-06-03 DIAGNOSIS — E039 Hypothyroidism, unspecified: Secondary | ICD-10-CM

## 2018-06-03 DIAGNOSIS — Z0001 Encounter for general adult medical examination with abnormal findings: Secondary | ICD-10-CM

## 2018-06-03 DIAGNOSIS — R519 Headache, unspecified: Secondary | ICD-10-CM

## 2018-06-03 DIAGNOSIS — G8929 Other chronic pain: Secondary | ICD-10-CM

## 2018-06-03 DIAGNOSIS — F411 Generalized anxiety disorder: Secondary | ICD-10-CM | POA: Diagnosis not present

## 2018-06-03 LAB — CBC
HEMATOCRIT: 34.8 % — AB (ref 36.0–46.0)
Hemoglobin: 11.6 g/dL — ABNORMAL LOW (ref 12.0–15.0)
MCHC: 33.3 g/dL (ref 30.0–36.0)
MCV: 80.3 fl (ref 78.0–100.0)
Platelets: 324 10*3/uL (ref 150.0–400.0)
RBC: 4.34 Mil/uL (ref 3.87–5.11)
RDW: 15.8 % — ABNORMAL HIGH (ref 11.5–15.5)
WBC: 4.8 10*3/uL (ref 4.0–10.5)

## 2018-06-03 LAB — LIPID PANEL
CHOL/HDL RATIO: 7
CHOLESTEROL: 270 mg/dL — AB (ref 0–200)
HDL: 39 mg/dL — AB (ref >39.00)
LDL Cholesterol: 207 mg/dL — ABNORMAL HIGH (ref 0–99)
NonHDL: 230.87
TRIGLYCERIDES: 119 mg/dL (ref 0.0–149.0)
VLDL: 23.8 mg/dL (ref 0.0–40.0)

## 2018-06-03 LAB — T4, FREE: Free T4: 0.76 ng/dL (ref 0.60–1.60)

## 2018-06-03 LAB — COMPREHENSIVE METABOLIC PANEL
ALBUMIN: 4 g/dL (ref 3.5–5.2)
ALT: 7 U/L (ref 0–35)
AST: 10 U/L (ref 0–37)
Alkaline Phosphatase: 73 U/L (ref 39–117)
BILIRUBIN TOTAL: 0.3 mg/dL (ref 0.2–1.2)
BUN: 17 mg/dL (ref 6–23)
CALCIUM: 9.2 mg/dL (ref 8.4–10.5)
CHLORIDE: 101 meq/L (ref 96–112)
CO2: 31 mEq/L (ref 19–32)
CREATININE: 1.02 mg/dL (ref 0.40–1.20)
GFR: 61.99 mL/min (ref >60.00)
Glucose, Bld: 83 mg/dL (ref 70–99)
Potassium: 4.3 mEq/L (ref 3.5–5.1)
SODIUM: 137 meq/L (ref 135–145)
Total Protein: 7.3 g/dL (ref 6.0–8.3)

## 2018-06-03 LAB — VITAMIN D 25 HYDROXY (VIT D DEFICIENCY, FRACTURES): VITD: 50 ng/mL (ref 30.00–100.00)

## 2018-06-03 LAB — TSH: TSH: 7.06 u[IU]/mL — AB (ref 0.35–4.50)

## 2018-06-03 MED ORDER — GABAPENTIN 100 MG PO CAPS: 100.0000 mg | ORAL_CAPSULE | Freq: Three times a day (TID) | ORAL | 2 refills | Status: DC

## 2018-06-03 NOTE — Progress Notes (Signed)
Subjective:    Patient ID: [name], female    DOB: 1970-10-11, 47 y.o.   MRN: [id number]  HPI  Pt presents to the clinic today for her annual exam. She is also due to follow up chronic conditions.  Chronic Headaches: Triggered by stress. She takes Neurontin, Fioricet and Flexeril as needed.  GAD: Chronic but stable on Sertraline and Xanax. She takes Xanax 2-3 times per week. She denies depression, SI/HI.  Hypothyroidism: She denies any issues on her current dose of Levothyroxine.  Flu: never Tetanus: 07/2016 Pap Smear: 2017, [facility] Mammogram: 01/2016 Vision Screening: as needed Dentist: biannually  Diet: She does eat meat. She consumes more veggies than fruits. She rarely eats fried foods. She drinks mostly sweet tea and water. Exercise: Walking dog.   Review of Systems      Past Medical History:  Diagnosis Date  . Allergy   . Anxiety   . Frequent headaches   . Hypothyroidism     Current Outpatient Medications  Medication Sig Dispense Refill  . ALPRAZolam (XANAX) 0.25 MG tablet Take 1 tablet (0.25 mg total) by mouth 2 (two) times daily as needed. 20 tablet 0  . AMETHIA 0.15-0.03 &0.01 MG tablet Take 1 tablet by mouth daily. 1 Package 0  . butalbital-acetaminophen-caffeine (FIORICET, ESGIC) 50-325-40 MG tablet Take 1 tablet by mouth every 6 (six) hours as needed for headache. 20 tablet 2  . cyclobenzaprine (FLEXERIL) 10 MG tablet Take 1 tablet (10 mg total) by mouth 3 (three) times daily as needed for muscle spasms. (Patient not taking: Reported on 05/23/2018) 15 tablet 0  . gabapentin (NEURONTIN) 100 MG capsule TAKE 1 CAPSULE (100 MG TOTAL) BY MOUTH 3 (THREE) TIMES DAILY. 30 capsule 2  . levocetirizine (XYZAL) 5 MG tablet TAKE 1 TABLET (5 MG TOTAL) BY MOUTH EVERY EVENING. 90 tablet 0  . levothyroxine (SYNTHROID, LEVOTHROID) 75 MCG tablet TAKE 1 TABLET (75 MCG TOTAL) BY MOUTH DAILY. MUST SCHEDULE ANNUAL PHYSICAL 90 tablet 0  . naproxen (NAPROSYN)  500 MG tablet Take 1 tablet (500 mg total) by mouth 2 (two) times daily with a meal. 30 tablet 0  . Prenatal Vit-Fe Fumarate-FA (PRENATAL MULTIVITAMIN) TABS tablet Take 1 tablet by mouth daily at 12 noon.    . sertraline (ZOLOFT) 100 MG tablet TAKE 1 TABLET (100 MG TOTAL) BY MOUTH DAILY. MUST SCHEDULE ANNUAL EXAM 90 tablet 0  . sulfacetamide (BLEPH-10) 10 % ophthalmic solution INSTILL 1-2 DROPS IN LEFT EYE 3 TIMES DAILY FOR 7 DAYS  0  . valACYclovir (VALTREX) 500 MG tablet Take 500 mg by mouth daily.  3   No current facility-administered medications for this visit.     Allergies  Allergen Reactions  . Ciprofloxacin     anxiety  . Keflex [Cephalexin] Hives  . Trazodone And Nefazodone Other (See Comments)    nightmares    Family History  Problem Relation Age of Onset  . Cancer Mother   . Depression Mother   . Cancer Father   . Hyperlipidemia Father     Social History   Socioeconomic History  . Marital status: Single    Spouse name: Not on file  . Number of children: Not on file  . Years of education: Not on file  . Highest education level: Not on file  Occupational History  . Not on file  Social Needs  . Financial resource strain: Not on file  . Food insecurity:    Worry: Not on file  Inability: Not on file  . Transportation needs:    Medical: Not on file    Non-medical: Not on file  Tobacco Use  . Smoking status: Never Smoker  . Smokeless tobacco: Never Used  Substance and Sexual Activity  . Alcohol use: Yes    Comment: occasional  . Drug use: No  . Sexual activity: Yes    Partners: Male  Lifestyle  . Physical activity:    Days per week: Not on file    Minutes per session: Not on file  . Stress: Not on file  Relationships  . Social connections:    Talks on phone: Not on file    Gets together: Not on file    Attends religious service: Not on file    Active member of club or organization: Not on file    Attends meetings of clubs or organizations: Not on  file    Relationship status: Not on file  . Intimate partner violence:    Fear of current or ex partner: Not on file    Emotionally abused: Not on file    Physically abused: Not on file    Forced sexual activity: Not on file  Other Topics Concern  . Not on file  Social History Narrative  . Not on file     Constitutional: Pt reports intermittent headaches. Denies fever, malaise, fatigue, or abrupt weight changes.  HEENT: Denies eye pain, eye redness, ear pain, ringing in the ears, wax buildup, runny nose, nasal congestion, bloody nose, or sore throat. Respiratory: Denies difficulty breathing, shortness of breath, cough or sputum production.   Cardiovascular: Denies chest pain, chest tightness, palpitations or swelling in the hands or feet.  Gastrointestinal: Denies abdominal pain, bloating, constipation, diarrhea or blood in the stool.  GU: Denies urgency, frequency, pain with urination, burning sensation, blood in urine, odor or discharge. Musculoskeletal: Denies decrease in range of motion, difficulty with gait, muscle pain or joint pain and swelling.  Skin: Denies redness, rashes, lesions or ulcercations.  Neurological: Denies dizziness, difficulty with memory, difficulty with speech or problems with balance and coordination.  Psych: Pt has a history of anxiety. Denies depression, SI/HI.  No other specific complaints in a complete review of systems (except as listed in HPI above).  Objective:   Physical Exam  BP 130/84   Pulse 80   Temp 98.2 F (36.8 C) (Oral)   Ht 5\' 5"  (1.651 m)   Wt 186 lb (84.4 kg)   SpO2 98%   BMI 30.95 kg/m  Wt Readings from Last 3 Encounters:  06/03/18 186 lb (84.4 kg)  05/23/18 188 lb 12.8 oz (85.6 kg)  04/26/18 190 lb (86.2 kg)    General: Appears her stated age, well developed, well nourished in NAD. Skin: Warm, dry and intact.  HEENT: Head: normal shape and size; Eyes: sclera white, no icterus, conjunctiva pink, PERRLA and EOMs intact;  Ears: Tm's gray and intact, normal light reflex; Throat/Mouth: Teeth present, mucosa pink and moist, no exudate, lesions or ulcerations noted.  Neck:  Neck supple, trachea midline. No masses, lumps or thyromegaly present.  Cardiovascular: Normal rate and rhythm. S1,S2 noted.  No murmur, rubs or gallops noted. No JVD or BLE edema.  Pulmonary/Chest: Normal effort and positive vesicular breath sounds. No respiratory distress. No wheezes, rales or ronchi noted.  Abdomen: Soft and nontender. Normal bowel sounds. No distention or masses noted. Liver, spleen and kidneys non palpable. Musculoskeletal: Strength 5/5 BUE/BLE. No difficulty with gait.  Neurological: Alert and  oriented.  Psychiatric: Mood and affect normal. Behavior is normal. Judgment and thought content normal.     BMET    Component Value Date/Time   NA 137 04/22/2017 1020   K 4.1 04/22/2017 1020   CL 103 04/22/2017 1020   CO2 27 04/22/2017 1020   GLUCOSE 85 04/22/2017 1020   BUN 11 04/22/2017 1020   CREATININE 0.85 04/22/2017 1020   CALCIUM 9.2 04/22/2017 1020    Lipid Panel     Component Value Date/Time   CHOL 188 04/22/2017 1020   TRIG 132.0 04/22/2017 1020   HDL 33.20 (L) 04/22/2017 1020   CHOLHDL 6 04/22/2017 1020   VLDL 26.4 04/22/2017 1020   LDLCALC 129 (H) 04/22/2017 1020    CBC    Component Value Date/Time   WBC 4.5 04/22/2017 1020   RBC 4.20 04/22/2017 1020   HGB 11.4 (L) 04/22/2017 1020   HCT 35.2 (L) 04/22/2017 1020   PLT 260.0 04/22/2017 1020   MCV 83.8 04/22/2017 1020   MCHC 32.4 04/22/2017 1020   RDW 15.7 (H) 04/22/2017 1020    Hgb A1C Lab Results  Component Value Date   HGBA1C 5.4 04/22/2017            Assessment & Plan:   Preventative Health Maintenance:  She declines flu shot today Tetanus UTD Pap smear UTD- will request record She will call to schedule her mammogram Encouraged her to consume a balanced diet and exercise regimen Advised her to see an eye doctor and dentist  annually Will check CBC, CMET, Lipid, TSH, Free T4 and Vit D today  RTC in 1 year, sooner if needed Webb Silversmith, NP

## 2018-06-03 NOTE — Patient Instructions (Signed)

## 2018-06-03 NOTE — Assessment & Plan Note (Signed)
Stable on Sertraline and prn Xanax Support offered today

## 2018-06-03 NOTE — Assessment & Plan Note (Signed)
TSH and Free T4 today Will adjust Synthroid if needed based on labs 

## 2018-06-03 NOTE — Assessment & Plan Note (Signed)
Stable on Neurontin, Fioricet and Flexeril Neurontin refilled

## 2018-06-06 ENCOUNTER — Encounter: Payer: Self-pay | Admitting: Internal Medicine

## 2018-06-06 MED ORDER — PRAVASTATIN SODIUM 20 MG PO TABS: 20.0000 mg | ORAL_TABLET | Freq: Every day | ORAL | 2 refills | Status: DC

## 2018-06-06 MED ORDER — LEVOTHYROXINE SODIUM 88 MCG PO TABS: 88.0000 ug | ORAL_TABLET | Freq: Every day | ORAL | 1 refills | Status: DC

## 2018-06-06 NOTE — Addendum Note (Signed)
Addended by: Lurlean Nanny on: 06/06/2018 02:24 PM   Modules accepted: Orders

## 2018-06-06 NOTE — Addendum Note (Signed)
Addended by: Lurlean Nanny on: 06/06/2018 10:22 AM   Modules accepted: Orders

## 2018-06-29 ENCOUNTER — Other Ambulatory Visit: Payer: Self-pay | Admitting: Internal Medicine

## 2018-07-02 ENCOUNTER — Other Ambulatory Visit: Payer: Self-pay | Admitting: Internal Medicine

## 2018-07-19 ENCOUNTER — Encounter: Payer: Self-pay | Admitting: Internal Medicine

## 2018-07-20 ENCOUNTER — Ambulatory Visit: Payer: BC Managed Care – PPO | Admitting: Internal Medicine

## 2018-07-20 ENCOUNTER — Encounter: Payer: Self-pay | Admitting: Internal Medicine

## 2018-07-20 VITALS — BP 128/84 | HR 74 | Temp 98.2°F | Wt 191.0 lb

## 2018-07-20 DIAGNOSIS — R300 Dysuria: Secondary | ICD-10-CM | POA: Diagnosis not present

## 2018-07-20 DIAGNOSIS — G8929 Other chronic pain: Secondary | ICD-10-CM

## 2018-07-20 DIAGNOSIS — R82998 Other abnormal findings in urine: Secondary | ICD-10-CM

## 2018-07-20 DIAGNOSIS — R51 Headache: Secondary | ICD-10-CM | POA: Diagnosis not present

## 2018-07-20 DIAGNOSIS — R519 Headache, unspecified: Secondary | ICD-10-CM

## 2018-07-20 DIAGNOSIS — E039 Hypothyroidism, unspecified: Secondary | ICD-10-CM

## 2018-07-20 DIAGNOSIS — F411 Generalized anxiety disorder: Secondary | ICD-10-CM

## 2018-07-20 LAB — POC URINALSYSI DIPSTICK (AUTOMATED)
Blood, UA: NEGATIVE
Glucose, UA: NEGATIVE
LEUKOCYTES UA: NEGATIVE
NITRITE UA: NEGATIVE
PH UA: 6 (ref 5.0–8.0)
PROTEIN UA: POSITIVE — AB
Spec Grav, UA: 1.025 (ref 1.010–1.025)
Urobilinogen, UA: 0.2 E.U./dL

## 2018-07-20 MED ORDER — GABAPENTIN 100 MG PO CAPS: 100.0000 mg | ORAL_CAPSULE | Freq: Three times a day (TID) | ORAL | 2 refills | Status: DC

## 2018-07-20 MED ORDER — CYCLOBENZAPRINE HCL 10 MG PO TABS: 10.0000 mg | ORAL_TABLET | Freq: Three times a day (TID) | ORAL | 0 refills | Status: DC | PRN

## 2018-07-20 MED ORDER — BUTALBITAL-APAP-CAFFEINE 50-325-40 MG PO TABS: 1.0000 | ORAL_TABLET | Freq: Four times a day (QID) | ORAL | 2 refills | Status: DC | PRN

## 2018-07-20 MED ORDER — ALPRAZOLAM 0.25 MG PO TABS: 0.2500 mg | ORAL_TABLET | Freq: Two times a day (BID) | ORAL | 0 refills | Status: DC | PRN

## 2018-07-20 NOTE — Progress Notes (Signed)
HPI  Pt presents to the clinic today with c/o dysuria and dark urine. She reports this started 1 week ago. She denies urgency, frequency or blood in her urine. She denies vaginal discharge, odor or abnormal bleeding. She denies fever, chills, nausea or low back pain. She has not tried anything OTC for her symptoms.  She is also due to recheck TSH. Her Synthroid was increased to 88 mcg daily 9/27.  She also needs refill of Xanax, Fioricet, Flexeril and Gabapentin.  Review of Systems  Past Medical History:  Diagnosis Date  . Allergy   . Anxiety   . Frequent headaches   . Hypothyroidism     Family History  Problem Relation Age of Onset  . Cancer Mother   . Depression Mother   . Cancer Father   . Hyperlipidemia Father     Social History   Socioeconomic History  . Marital status: Single    Spouse name: Not on file  . Number of children: Not on file  . Years of education: Not on file  . Highest education level: Not on file  Occupational History  . Not on file  Social Needs  . Financial resource strain: Not on file  . Food insecurity:    Worry: Not on file    Inability: Not on file  . Transportation needs:    Medical: Not on file    Non-medical: Not on file  Tobacco Use  . Smoking status: Never Smoker  . Smokeless tobacco: Never Used  Substance and Sexual Activity  . Alcohol use: Yes    Comment: occasional  . Drug use: No  . Sexual activity: Yes    Partners: Male  Lifestyle  . Physical activity:    Days per week: Not on file    Minutes per session: Not on file  . Stress: Not on file  Relationships  . Social connections:    Talks on phone: Not on file    Gets together: Not on file    Attends religious service: Not on file    Active member of club or organization: Not on file    Attends meetings of clubs or organizations: Not on file    Relationship status: Not on file  . Intimate partner violence:    Fear of current or ex partner: Not on file    Emotionally  abused: Not on file    Physically abused: Not on file    Forced sexual activity: Not on file  Other Topics Concern  . Not on file  Social History Narrative  . Not on file    Allergies  Allergen Reactions  . Ciprofloxacin     anxiety  . Keflex [Cephalexin] Hives  . Trazodone And Nefazodone Other (See Comments)    nightmares     Constitutional: Denies fever, malaise, fatigue, headache or abrupt weight changes.   GU: Pt reports dark urine and pain with urination. Denies urgency, frequency, burning sensation, blood in urine, odor or discharge. Skin: Denies redness, rashes, lesions or ulcercations.   No other specific complaints in a complete review of systems (except as listed in HPI above).    Objective:   Physical Exam  BP 128/84   Pulse 74   Temp 98.2 F (36.8 C) (Oral)   Wt 191 lb (86.6 kg)   SpO2 98%   BMI 31.78 kg/m   Wt Readings from Last 3 Encounters:  06/03/18 186 lb (84.4 kg)  05/23/18 188 lb 12.8 oz (85.6 kg)  04/26/18  190 lb (86.2 kg)    General: Appears her stated age, well developed, well nourished in NAD. Abdomen: Soft. Normal bowel sounds. No distention or masses noted. Tender to palpation over the bladder area. No CVA tenderness.        Assessment & Plan:   Dark Urine, Dysuria:  Urinalysis: no leuks, nitrites or blood Will send urine culture Drink plenty of fluids  Hypothyroidism:  Recheck TSH today  RTC as needed or if symptoms persist. Webb Silversmith, NP

## 2018-07-21 ENCOUNTER — Encounter: Payer: Self-pay | Admitting: Internal Medicine

## 2018-07-21 LAB — URINE CULTURE
MICRO NUMBER:: 91367438
SPECIMEN QUALITY: ADEQUATE

## 2018-07-21 LAB — TSH: TSH: 4.79 u[IU]/mL — ABNORMAL HIGH (ref 0.35–4.50)

## 2018-07-21 NOTE — Patient Instructions (Signed)

## 2018-07-22 MED ORDER — LEVOTHYROXINE SODIUM 88 MCG PO TABS: 88.0000 ug | ORAL_TABLET | Freq: Every day | ORAL | 5 refills | Status: DC

## 2018-07-22 NOTE — Addendum Note (Signed)
Addended by: Lurlean Nanny on: 07/22/2018 11:53 AM   Modules accepted: Orders

## 2018-07-30 ENCOUNTER — Other Ambulatory Visit: Payer: Self-pay | Admitting: Internal Medicine

## 2018-08-17 ENCOUNTER — Other Ambulatory Visit: Payer: Self-pay | Admitting: Internal Medicine

## 2018-09-19 ENCOUNTER — Other Ambulatory Visit: Payer: Self-pay | Admitting: Internal Medicine

## 2018-10-12 ENCOUNTER — Telehealth: Payer: Self-pay | Admitting: Internal Medicine

## 2018-10-12 DIAGNOSIS — E7800 Pure hypercholesterolemia, unspecified: Secondary | ICD-10-CM

## 2018-10-12 NOTE — Telephone Encounter (Signed)
I have future ordered the lab that she was supposed to repeat 2-3 months ago... pt has to be placed on lab schedule, that is the only way we can document and drop orders in the lab

## 2018-10-12 NOTE — Telephone Encounter (Signed)
Pt is supposed to have labs drawn for cholesterol. She does not want to schedule an appointment. She is wanting to just stop by on a day that is convenient for her. There are currently no orders in.

## 2018-11-19 ENCOUNTER — Other Ambulatory Visit: Payer: Self-pay | Admitting: Internal Medicine

## 2018-11-21 ENCOUNTER — Encounter: Payer: Self-pay | Admitting: Internal Medicine

## 2018-11-22 NOTE — Telephone Encounter (Signed)
Last filled 07/21/2019... please advise

## 2018-11-28 ENCOUNTER — Encounter: Payer: Self-pay | Admitting: Internal Medicine

## 2018-11-28 MED ORDER — CYCLOBENZAPRINE HCL 10 MG PO TABS: 10.0000 mg | ORAL_TABLET | Freq: Three times a day (TID) | ORAL | 0 refills | Status: DC | PRN

## 2018-11-28 MED ORDER — LEVOTHYROXINE SODIUM 88 MCG PO TABS: 88.0000 ug | ORAL_TABLET | Freq: Every day | ORAL | 1 refills | Status: DC

## 2018-11-28 NOTE — Telephone Encounter (Signed)
Flexeril last filled 07/2018... please advise

## 2018-12-03 ENCOUNTER — Encounter: Payer: Self-pay | Admitting: Internal Medicine

## 2018-12-05 ENCOUNTER — Encounter: Payer: Self-pay | Admitting: Internal Medicine

## 2018-12-08 ENCOUNTER — Other Ambulatory Visit: Payer: Self-pay

## 2018-12-08 ENCOUNTER — Encounter: Payer: Self-pay | Admitting: Internal Medicine

## 2018-12-08 ENCOUNTER — Ambulatory Visit: Payer: BC Managed Care – PPO | Admitting: Internal Medicine

## 2018-12-08 VITALS — BP 140/84 | HR 81 | Temp 98.2°F | Wt 192.0 lb

## 2018-12-08 DIAGNOSIS — E7800 Pure hypercholesterolemia, unspecified: Secondary | ICD-10-CM

## 2018-12-08 DIAGNOSIS — R002 Palpitations: Secondary | ICD-10-CM | POA: Diagnosis not present

## 2018-12-08 NOTE — Progress Notes (Signed)
Subjective:    Patient ID: [name], female    DOB: 1971-05-04, 48 y.o.   MRN: [id number]  HPI  Pt presents to the clinic today with c/o palpitations. This started 2 weeks ago. She reports it only last for a few seconds. She denies chest pain, chest tightness or shortness of breath. She does have a history of hypothyroidism. Her last TSH 07/2018 was normal. She has not been feeling overly anxious or overwhelmed. She has not tried anything OTC for this.  Of note, her BP today is 140/84. She has had multiple elevated blood pressure but no diagnosis of HTN.  Review of Systems   Past Medical History:  Diagnosis Date  . Allergy   . Anxiety   . Frequent headaches   . Hypothyroidism     Current Outpatient Medications  Medication Sig Dispense Refill  . ALPRAZolam (XANAX) 0.25 MG tablet TAKE 1 TABLET (0.25 MG TOTAL) BY MOUTH 2 (TWO) TIMES DAILY AS NEEDED. 20 tablet 0  . AMETHIA 0.15-0.03 &0.01 MG tablet Take 1 tablet by mouth daily. 1 Package 0  . butalbital-acetaminophen-caffeine (FIORICET, ESGIC) 50-325-40 MG tablet Take 1 tablet by mouth every 6 (six) hours as needed for headache. 20 tablet 2  . cyclobenzaprine (FLEXERIL) 10 MG tablet Take 1 tablet (10 mg total) by mouth 3 (three) times daily as needed for muscle spasms. 15 tablet 0  . gabapentin (NEURONTIN) 100 MG capsule TAKE 1 CAPSULE BY MOUTH THREE TIMES A DAY 90 capsule 2  . levocetirizine (XYZAL) 5 MG tablet TAKE 1 TABLET BY MOUTH EVERY DAY IN THE EVENING 90 tablet 2  . levothyroxine (SYNTHROID, LEVOTHROID) 88 MCG tablet Take 1 tablet (88 mcg total) by mouth daily. SCHEDULE LAB ONLY VISIT 30 tablet 1  . pravastatin (PRAVACHOL) 20 MG tablet TAKE 1 TABLET BY MOUTH EVERY DAY 90 tablet 0  . Prenatal Vit-Fe Fumarate-FA (PRENATAL MULTIVITAMIN) TABS tablet Take 1 tablet by mouth daily at 12 noon.    . sertraline (ZOLOFT) 100 MG tablet TAKE 1 TABLET (100 MG TOTAL) BY MOUTH DAILY 90 tablet 2  . valACYclovir (VALTREX) 500 MG tablet  Take 500 mg by mouth daily.  3   No current facility-administered medications for this visit.     Allergies  Allergen Reactions  . Ciprofloxacin     anxiety  . Keflex [Cephalexin] Hives  . Trazodone And Nefazodone Other (See Comments)    nightmares    Family History  Problem Relation Age of Onset  . Cancer Mother   . Depression Mother   . Cancer Father   . Hyperlipidemia Father     Social History   Socioeconomic History  . Marital status: Single    Spouse name: Not on file  . Number of children: Not on file  . Years of education: Not on file  . Highest education level: Not on file  Occupational History  . Not on file  Social Needs  . Financial resource strain: Not on file  . Food insecurity:    Worry: Not on file    Inability: Not on file  . Transportation needs:    Medical: Not on file    Non-medical: Not on file  Tobacco Use  . Smoking status: Never Smoker  . Smokeless tobacco: Never Used  Substance and Sexual Activity  . Alcohol use: Yes    Comment: occasional  . Drug use: No  . Sexual activity: Yes    Partners: Male  Lifestyle  . Physical activity:  Days per week: Not on file    Minutes per session: Not on file  . Stress: Not on file  Relationships  . Social connections:    Talks on phone: Not on file    Gets together: Not on file    Attends religious service: Not on file    Active member of club or organization: Not on file    Attends meetings of clubs or organizations: Not on file    Relationship status: Not on file  . Intimate partner violence:    Fear of current or ex partner: Not on file    Emotionally abused: Not on file    Physically abused: Not on file    Forced sexual activity: Not on file  Other Topics Concern  . Not on file  Social History Narrative  . Not on file     Constitutional: Denies fever, malaise, fatigue, headache or abrupt weight changes.  Respiratory: Denies difficulty breathing, shortness of breath, cough or  sputum production.   Cardiovascular: Pt reports palpitations. Denies chest pain, chest tightness,  or swelling in the hands or feet.  Gastrointestinal: Denies abdominal pain, bloating, constipation, diarrhea or blood in the stool.  Neurological: Denies dizziness, difficulty with memory, difficulty with speech or problems with balance and coordination.  Psych: Denies anxiety, depression, SI/HI.  No other specific complaints in a complete review of systems (except as listed in HPI above).  Objective:   Physical Exam BP 140/84   Pulse 81   Temp 98.2 F (36.8 C) (Oral)   Wt 192 lb (87.1 kg)   SpO2 98%   BMI 31.95 kg/m  Wt Readings from Last 3 Encounters:  12/08/18 192 lb (87.1 kg)  07/20/18 191 lb (86.6 kg)  06/03/18 186 lb (84.4 kg)    General: Appears her stated age, obese, in NAD. Neck:  Neck supple, trachea midline. No masses, lumps or thyromegaly present.  Cardiovascular: Normal rate and rhythm. S1,S2 noted.  No murmur, rubs or gallops noted.  Pulmonary/Chest: Normal effort and positive vesicular breath sounds. No respiratory distress. No wheezes, rales or ronchi noted.  Neurological: Alert and oriented.  Psychiatric: Mood and affect normal.   BMET    Component Value Date/Time   NA 137 06/03/2018 1424   K 4.3 06/03/2018 1424   CL 101 06/03/2018 1424   CO2 31 06/03/2018 1424   GLUCOSE 83 06/03/2018 1424   BUN 17 06/03/2018 1424   CREATININE 1.02 06/03/2018 1424   CALCIUM 9.2 06/03/2018 1424    Lipid Panel     Component Value Date/Time   CHOL 270 (H) 06/03/2018 1424   TRIG 119.0 06/03/2018 1424   HDL 39.00 (L) 06/03/2018 1424   CHOLHDL 7 06/03/2018 1424   VLDL 23.8 06/03/2018 1424   LDLCALC 207 (H) 06/03/2018 1424    CBC    Component Value Date/Time   WBC 4.8 06/03/2018 1424   RBC 4.34 06/03/2018 1424   HGB 11.6 (L) 06/03/2018 1424   HCT 34.8 (L) 06/03/2018 1424   PLT 324.0 06/03/2018 1424   MCV 80.3 06/03/2018 1424   MCHC 33.3 06/03/2018 1424   RDW  15.8 (H) 06/03/2018 1424    Hgb A1C Lab Results  Component Value Date   HGBA1C 5.4 04/22/2017             Assessment & Plan:   Palpitations:  Indication for ECG: palpitation Interpretation: PAC's, short PR interval Comparison: None Will obtain BMET, Mg, TSH and Free T4 Continue current meds as is  for now, may need adjustment pending labs but I will let you know Red flags discussed  Will follow up after labs are back, return precautions discussed Webb Silversmith, NP

## 2018-12-08 NOTE — Patient Instructions (Signed)
Palpitations  Palpitations are feelings that your heartbeat is not normal. Your heartbeat may feel like it is:   Uneven.   Faster than normal.   Fluttering.   Skipping a beat.  This is usually not a serious problem. In some cases, you may need tests to rule out any serious problems.  Follow these instructions at home:  Pay attention to any changes in your condition. Take these actions to help manage your symptoms:  Eating and drinking   Avoid:  ? Coffee, tea, soft drinks, and energy drinks.  ? Chocolate.  ? Alcohol.  ? Diet pills.  Lifestyle     Try to lower your stress. These things can help you relax:  ? Yoga.  ? Deep breathing and meditation.  ? Exercise.  ? Using words and images to create positive thoughts (guided imagery).  ? Using your mind to control things in your body (biofeedback).   Do not use drugs.   Get plenty of rest and sleep. Keep a regular bed time.  General instructions     Take over-the-counter and prescription medicines only as told by your doctor.   Do not use any products that contain nicotine or tobacco, such as cigarettes and e-cigarettes. If you need help quitting, ask your doctor.   Keep all follow-up visits as told by your doctor. This is important. You may need more tests if palpitations do not go away or get worse.  Contact a doctor if:   Your symptoms last more than 24 hours.   Your symptoms occur more often.  Get help right away if you:   Have chest pain.   Feel short of breath.   Have a very bad headache.   Feel dizzy.   Pass out (faint).  Summary   Palpitations are feelings that your heartbeat is uneven or faster than normal. It may feel like your heart is fluttering or skipping a beat.   Avoid food and drinks that may cause palpitations. These include caffeine, chocolate, and alcohol.   Try to lower your stress. Do not smoke or use drugs.   Get help right away if you faint or have chest pain, shortness of breath, a severe headache, or dizziness.  This  information is not intended to replace advice given to you by your health care provider. Make sure you discuss any questions you have with your health care provider.  Document Released: 06/02/2008 Document Revised: 10/06/2017 Document Reviewed: 10/06/2017  Elsevier Interactive Patient Education  2019 [employer]

## 2018-12-09 LAB — BASIC METABOLIC PANEL
BUN: 8 mg/dL (ref 6–23)
CO2: 28 mEq/L (ref 19–32)
Calcium: 8.8 mg/dL (ref 8.4–10.5)
Chloride: 99 mEq/L (ref 96–112)
Creatinine, Ser: 1.04 mg/dL (ref 0.40–1.20)
GFR: 56.91 mL/min — ABNORMAL LOW (ref >60.00)
Glucose, Bld: 75 mg/dL (ref 70–99)
Potassium: 4.2 mEq/L (ref 3.5–5.1)
Sodium: 135 mEq/L (ref 135–145)

## 2018-12-09 LAB — LIPID PANEL
Cholesterol: 226 mg/dL — ABNORMAL HIGH (ref 0–200)
HDL: 41 mg/dL (ref >39.00)
LDL Cholesterol: 163 mg/dL — ABNORMAL HIGH (ref 0–99)
NonHDL: 184.58
Total CHOL/HDL Ratio: 6
Triglycerides: 110 mg/dL (ref 0.0–149.0)
VLDL: 22 mg/dL (ref 0.0–40.0)

## 2018-12-09 LAB — T4, FREE: Free T4: 0.81 ng/dL (ref 0.60–1.60)

## 2018-12-09 LAB — TSH: TSH: 6.57 u[IU]/mL — ABNORMAL HIGH (ref 0.35–4.50)

## 2018-12-09 LAB — MAGNESIUM: Magnesium: 2.1 mg/dL (ref 1.5–2.5)

## 2018-12-12 MED ORDER — LEVOTHYROXINE SODIUM 100 MCG PO TABS: 100.0000 ug | ORAL_TABLET | Freq: Every day | ORAL | 0 refills | Status: DC

## 2018-12-12 MED ORDER — PRAVASTATIN SODIUM 40 MG PO TABS: 40.0000 mg | ORAL_TABLET | Freq: Every day | ORAL | 0 refills | Status: DC

## 2018-12-28 ENCOUNTER — Encounter: Payer: Self-pay | Admitting: Internal Medicine

## 2018-12-30 ENCOUNTER — Encounter: Payer: Self-pay | Admitting: Internal Medicine

## 2018-12-30 ENCOUNTER — Other Ambulatory Visit: Payer: Self-pay

## 2018-12-30 ENCOUNTER — Ambulatory Visit (INDEPENDENT_AMBULATORY_CARE_PROVIDER_SITE_OTHER): Payer: BC Managed Care – PPO | Admitting: Internal Medicine

## 2018-12-30 DIAGNOSIS — R002 Palpitations: Secondary | ICD-10-CM | POA: Diagnosis not present

## 2018-12-30 MED ORDER — METOPROLOL SUCCINATE ER 25 MG PO TB24: 25.0000 mg | ORAL_TABLET | Freq: Every day | ORAL | 3 refills | Status: DC

## 2018-12-30 NOTE — Patient Instructions (Signed)
Palpitations  Palpitations are feelings that your heartbeat is not normal. Your heartbeat may feel like it is:   Uneven.   Faster than normal.   Fluttering.   Skipping a beat.  This is usually not a serious problem. In some cases, you may need tests to rule out any serious problems.  Follow these instructions at home:  Pay attention to any changes in your condition. Take these actions to help manage your symptoms:  Eating and drinking   Avoid:  ? Coffee, tea, soft drinks, and energy drinks.  ? Chocolate.  ? Alcohol.  ? Diet pills.  Lifestyle     Try to lower your stress. These things can help you relax:  ? Yoga.  ? Deep breathing and meditation.  ? Exercise.  ? Using words and images to create positive thoughts (guided imagery).  ? Using your mind to control things in your body (biofeedback).   Do not use drugs.   Get plenty of rest and sleep. Keep a regular bed time.  General instructions     Take over-the-counter and prescription medicines only as told by your doctor.   Do not use any products that contain nicotine or tobacco, such as cigarettes and e-cigarettes. If you need help quitting, ask your doctor.   Keep all follow-up visits as told by your doctor. This is important. You may need more tests if palpitations do not go away or get worse.  Contact a doctor if:   Your symptoms last more than 24 hours.   Your symptoms occur more often.  Get help right away if you:   Have chest pain.   Feel short of breath.   Have a very bad headache.   Feel dizzy.   Pass out (faint).  Summary   Palpitations are feelings that your heartbeat is uneven or faster than normal. It may feel like your heart is fluttering or skipping a beat.   Avoid food and drinks that may cause palpitations. These include caffeine, chocolate, and alcohol.   Try to lower your stress. Do not smoke or use drugs.   Get help right away if you faint or have chest pain, shortness of breath, a severe headache, or dizziness.  This  information is not intended to replace advice given to you by your health care provider. Make sure you discuss any questions you have with your health care provider.  Document Released: 06/02/2008 Document Revised: 10/06/2017 Document Reviewed: 10/06/2017  Elsevier Interactive Patient Education  2019 [employer]

## 2018-12-30 NOTE — Progress Notes (Signed)
Virtual Visit via Video Note  I connected with [name] on 12/30/18 at  4:00 PM EDT by a video enabled telemedicine application and verified that I am speaking with the correct person using two identifiers.   I discussed the limitations of evaluation and management by telemedicine and the availability of in person appointments. The patient expressed understanding and agreed to proceed.  Patient Location: Home Provider Location: Home  History of Present Illness:  Pt reports persistent palpitations. She was seen for the same 4/2. TSH, CMET, Mg were all normal. ECG showed frequent PAC's. She reports it seems to be coming more frequently. She also report some pressure in her chest when it occurs but she denies shortness of breath or syncope. She is wondering is she needs a referral to cardiology.    Observations/Objective:   Past Medical History:  Diagnosis Date  . Allergy   . Anxiety   . Frequent headaches   . Hypothyroidism     Current Outpatient Medications  Medication Sig Dispense Refill  . ALPRAZolam (XANAX) 0.25 MG tablet TAKE 1 TABLET (0.25 MG TOTAL) BY MOUTH 2 (TWO) TIMES DAILY AS NEEDED. 20 tablet 0  . AMETHIA 0.15-0.03 &0.01 MG tablet Take 1 tablet by mouth daily. 1 Package 0  . butalbital-acetaminophen-caffeine (FIORICET, ESGIC) 50-325-40 MG tablet Take 1 tablet by mouth every 6 (six) hours as needed for headache. 20 tablet 2  . cyclobenzaprine (FLEXERIL) 10 MG tablet Take 1 tablet (10 mg total) by mouth 3 (three) times daily as needed for muscle spasms. 15 tablet 0  . gabapentin (NEURONTIN) 100 MG capsule TAKE 1 CAPSULE BY MOUTH THREE TIMES A DAY 90 capsule 2  . levocetirizine (XYZAL) 5 MG tablet TAKE 1 TABLET BY MOUTH EVERY DAY IN THE EVENING 90 tablet 2  . levothyroxine (SYNTHROID, LEVOTHROID) 100 MCG tablet Take 1 tablet (100 mcg total) by mouth daily. 30 tablet 0  . pravastatin (PRAVACHOL) 40 MG tablet Take 1 tablet (40 mg total) by mouth daily. 90 tablet 0  .  Prenatal Vit-Fe Fumarate-FA (PRENATAL MULTIVITAMIN) TABS tablet Take 1 tablet by mouth daily at 12 noon.    . sertraline (ZOLOFT) 100 MG tablet TAKE 1 TABLET (100 MG TOTAL) BY MOUTH DAILY 90 tablet 2  . valACYclovir (VALTREX) 500 MG tablet Take 500 mg by mouth daily.  3  . metoprolol succinate (TOPROL-XL) 25 MG 24 hr tablet Take 1 tablet (25 mg total) by mouth daily. 90 tablet 3   No current facility-administered medications for this visit.     Allergies  Allergen Reactions  . Ciprofloxacin     anxiety  . Keflex [Cephalexin] Hives  . Trazodone And Nefazodone Other (See Comments)    nightmares    Family History  Problem Relation Age of Onset  . Cancer Mother   . Depression Mother   . Cancer Father   . Hyperlipidemia Father     Social History   Socioeconomic History  . Marital status: Single    Spouse name: Not on file  . Number of children: Not on file  . Years of education: Not on file  . Highest education level: Not on file  Occupational History  . Not on file  Social Needs  . Financial resource strain: Not on file  . Food insecurity:    Worry: Not on file    Inability: Not on file  . Transportation needs:    Medical: Not on file    Non-medical: Not on file  Tobacco Use  .  Smoking status: Never Smoker  . Smokeless tobacco: Never Used  Substance and Sexual Activity  . Alcohol use: Yes    Comment: occasional  . Drug use: No  . Sexual activity: Yes    Partners: Male  Lifestyle  . Physical activity:    Days per week: Not on file    Minutes per session: Not on file  . Stress: Not on file  Relationships  . Social connections:    Talks on phone: Not on file    Gets together: Not on file    Attends religious service: Not on file    Active member of club or organization: Not on file    Attends meetings of clubs or organizations: Not on file    Relationship status: Not on file  . Intimate partner violence:    Fear of current or ex partner: Not on file     Emotionally abused: Not on file    Physically abused: Not on file    Forced sexual activity: Not on file  Other Topics Concern  . Not on file  Social History Narrative  . Not on file     Constitutional: Denies fever, malaise, fatigue, headache or abrupt weight changes.  Respiratory: Denies difficulty breathing, shortness of breath, cough or sputum production.   Cardiovascular: Pt reports palpitations. Denies chest pain, chest tightness, or swelling in the hands or feet.  Neurological: Denies dizziness, difficulty with memory, difficulty with speech or problems with balance and coordination.  Psych: Denies anxiety, depression, SI/HI.  No other specific complaints in a complete review of systems (except as listed in HPI above).   Wt Readings from Last 3 Encounters:  12/08/18 192 lb (87.1 kg)  07/20/18 191 lb (86.6 kg)  06/03/18 186 lb (84.4 kg)    General: Appears her stated age, obese in NAD. Pulmonary/Chest: Normal effort. No respiratory distress. Neurological: Alert and oriented. Psychiatric: Mood and affect normal. Behavior is normal. Judgment and thought content normal.     BMET    Component Value Date/Time   NA 135 12/08/2018 1532   K 4.2 12/08/2018 1532   CL 99 12/08/2018 1532   CO2 28 12/08/2018 1532   GLUCOSE 75 12/08/2018 1532   BUN 8 12/08/2018 1532   CREATININE 1.04 12/08/2018 1532   CALCIUM 8.8 12/08/2018 1532    Lipid Panel     Component Value Date/Time   CHOL 226 (H) 12/08/2018 1532   TRIG 110.0 12/08/2018 1532   HDL 41.00 12/08/2018 1532   CHOLHDL 6 12/08/2018 1532   VLDL 22.0 12/08/2018 1532   LDLCALC 163 (H) 12/08/2018 1532    CBC    Component Value Date/Time   WBC 4.8 06/03/2018 1424   RBC 4.34 06/03/2018 1424   HGB 11.6 (L) 06/03/2018 1424   HCT 34.8 (L) 06/03/2018 1424   PLT 324.0 06/03/2018 1424   MCV 80.3 06/03/2018 1424   MCHC 33.3 06/03/2018 1424   RDW 15.8 (H) 06/03/2018 1424    Hgb A1C Lab Results  Component Value Date    HGBA1C 5.4 04/22/2017       Assessment and Plan:  Palpitations:  TSH, CMET and Mg normal ECG reviewed- c/w frequent PAC's Encouraged her to trial beta blocker prior to cardiology referral Will start Metoprolol 25 mg daily prn If using a lot, she will just start Metoprolol 25 mg daily If symptoms persist, consider cardiology referral for Holter monitor and further evaluation  Follow Up Instructions:    I discussed the assessment and  treatment plan with the patient. The patient was provided an opportunity to ask questions and all were answered. The patient agreed with the plan and demonstrated an understanding of the instructions.   The patient was advised to call back or seek an in-person evaluation if the symptoms worsen or if the condition fails to improve as anticipated.    Webb Silversmith, NP

## 2018-12-31 ENCOUNTER — Encounter: Payer: Self-pay | Admitting: Internal Medicine

## 2018-12-31 DIAGNOSIS — E039 Hypothyroidism, unspecified: Secondary | ICD-10-CM

## 2019-01-02 ENCOUNTER — Other Ambulatory Visit: Payer: Self-pay | Admitting: Internal Medicine

## 2019-01-02 DIAGNOSIS — R002 Palpitations: Secondary | ICD-10-CM

## 2019-01-03 ENCOUNTER — Other Ambulatory Visit: Payer: Self-pay | Admitting: Internal Medicine

## 2019-01-03 ENCOUNTER — Telehealth: Payer: Self-pay | Admitting: Cardiovascular Disease

## 2019-01-03 NOTE — Telephone Encounter (Signed)

## 2019-01-04 DIAGNOSIS — R002 Palpitations: Secondary | ICD-10-CM | POA: Insufficient documentation

## 2019-01-04 NOTE — Progress Notes (Signed)
Virtual Visit via Video Note   This visit type was conducted due to national recommendations for restrictions regarding the COVID-19 Pandemic (e.g. social distancing) in an effort to limit this patient's exposure and mitigate transmission in our community.  Due to her co-morbid illnesses, this patient is at least at moderate risk for complications without adequate follow up.  This format is felt to be most appropriate for this patient at this time.  All issues noted in this document were discussed and addressed.  A limited physical exam was performed with this format.  Please refer to the patient's chart for her consent to telehealth for [facility].   I connected with  [name] on 01/05/19 by a video enabled telemedicine application and verified that I am speaking with the correct person using two identifiers. I discussed the limitations of evaluation and management by telemedicine. The patient expressed understanding and agreed to proceed.   Evaluation Performed:  Follow-up visit  Date:  01/05/2019   ID:  [name], DOB [birth year], MRN [id number]  Patient Location:  [address] [place] [address]   Provider location:   [facility], [place] office  PCP:  Jearld Fenton, NP  Cardiologist:  Patsy Baltimore   Chief Complaint:  palpitations    History of Present Illness:    [name] is a 48 y.o. female who presents via audio/video conferencing for a telehealth visit today.   The patient does not symptoms concerning for COVID-19 infection (fever, chills, cough, or new SHORTNESS OF BREATH).   Patient has a past medical history of H/a Anxiety hypothyroid Referred by Dr. Webb Silversmith for consultation of her palpitations   15 yr ago, used to have PVCs thyroid "out of wack"  persistent palpitations. She was seen for the same 4/2. TSH, CMET, Mg were all normal. ECG showed frequent PAC's.   She reports it seems to be coming more  frequently. She also report some pressure in her chest when it occurs but she denies shortness of breath or syncope.   Sx worse when laying down, Less sx when active  ECG reviewed- c/w frequent PAC's Personally reviewed showing rare PACs  Tried Metoprolol succinate 25 mg daily prn Has been taking it daily recently Does not think it has been helping very much Does not even think it is changed her resting heart rate  TSH 6.57  Total chol 226 LDL 163   Prior CV studies:   The following studies were reviewed today:    Past Medical History:  Diagnosis Date  . Allergy   . Anxiety   . Frequent headaches   . Hypothyroidism    Past Surgical History:  Procedure Laterality Date  . APPENDECTOMY  2013  . lapband  2013  . TONSILLECTOMY  2012     Current Meds  Medication Sig  . ALPRAZolam (XANAX) 0.25 MG tablet TAKE 1 TABLET (0.25 MG TOTAL) BY MOUTH 2 (TWO) TIMES DAILY AS NEEDED.  [name] AMETHIA 0.15-0.03 &0.01 MG tablet Take 1 tablet by mouth daily.  . butalbital-acetaminophen-caffeine (FIORICET, ESGIC) 50-325-40 MG tablet Take 1 tablet by mouth every 6 (six) hours as needed for headache.  . cyclobenzaprine (FLEXERIL) 10 MG tablet Take 1 tablet (10 mg total) by mouth 3 (three) times daily as needed for muscle spasms.  [name] gabapentin (NEURONTIN) 100 MG capsule TAKE 1 CAPSULE BY MOUTH THREE TIMES A DAY  . levocetirizine (XYZAL) 5 MG tablet TAKE 1 TABLET BY MOUTH EVERY DAY  IN THE EVENING  . levothyroxine (SYNTHROID, LEVOTHROID) 100 MCG tablet Take 1 tablet (100 mcg total) by mouth daily.  . metoprolol succinate (TOPROL-XL) 25 MG 24 hr tablet Take 1 tablet (25 mg total) by mouth daily.  . pravastatin (PRAVACHOL) 40 MG tablet Take 1 tablet (40 mg total) by mouth daily.  . Prenatal Vit-Fe Fumarate-FA (PRENATAL MULTIVITAMIN) TABS tablet Take 1 tablet by mouth daily at 12 noon.  . sertraline (ZOLOFT) 100 MG tablet TAKE 1 TABLET (100 MG TOTAL) BY MOUTH DAILY  . valACYclovir (VALTREX) 500 MG  tablet Take 500 mg by mouth daily.     Allergies:   Ciprofloxacin; Keflex [cephalexin]; and Trazodone and nefazodone   Social History   Tobacco Use  . Smoking status: Never Smoker  . Smokeless tobacco: Never Used  Substance Use Topics  . Alcohol use: Yes    Comment: occasional  . Drug use: No     Current Outpatient Medications on File Prior to Visit  Medication Sig Dispense Refill  . ALPRAZolam (XANAX) 0.25 MG tablet TAKE 1 TABLET (0.25 MG TOTAL) BY MOUTH 2 (TWO) TIMES DAILY AS NEEDED. 20 tablet 0  . AMETHIA 0.15-0.03 &0.01 MG tablet Take 1 tablet by mouth daily. 1 Package 0  . butalbital-acetaminophen-caffeine (FIORICET, ESGIC) 50-325-40 MG tablet Take 1 tablet by mouth every 6 (six) hours as needed for headache. 20 tablet 2  . cyclobenzaprine (FLEXERIL) 10 MG tablet Take 1 tablet (10 mg total) by mouth 3 (three) times daily as needed for muscle spasms. 15 tablet 0  . gabapentin (NEURONTIN) 100 MG capsule TAKE 1 CAPSULE BY MOUTH THREE TIMES A DAY 90 capsule 2  . levocetirizine (XYZAL) 5 MG tablet TAKE 1 TABLET BY MOUTH EVERY DAY IN THE EVENING 90 tablet 2  . levothyroxine (SYNTHROID, LEVOTHROID) 100 MCG tablet Take 1 tablet (100 mcg total) by mouth daily. 30 tablet 0  . metoprolol succinate (TOPROL-XL) 25 MG 24 hr tablet Take 1 tablet (25 mg total) by mouth daily. 90 tablet 3  . pravastatin (PRAVACHOL) 40 MG tablet Take 1 tablet (40 mg total) by mouth daily. 90 tablet 0  . Prenatal Vit-Fe Fumarate-FA (PRENATAL MULTIVITAMIN) TABS tablet Take 1 tablet by mouth daily at 12 noon.    . sertraline (ZOLOFT) 100 MG tablet TAKE 1 TABLET (100 MG TOTAL) BY MOUTH DAILY 90 tablet 2  . valACYclovir (VALTREX) 500 MG tablet Take 500 mg by mouth daily.  3   No current facility-administered medications on file prior to visit.      Family Hx: The patient's family history includes Cancer in her father and mother; Depression in her mother; Hyperlipidemia in her father.  ROS:   Please see the  history of present illness.    Review of Systems  Constitutional: Negative.   HENT: Negative.   Respiratory: Negative.   Cardiovascular: Positive for chest pain and palpitations.  Gastrointestinal: Negative.   Musculoskeletal: Negative.   Neurological: Negative.   Psychiatric/Behavioral: Negative.   All other systems reviewed and are negative.     Labs/Other Tests and Data Reviewed:    Recent Labs: 06/03/2018: ALT 7; Hemoglobin 11.6; Platelets 324.0 12/08/2018: BUN 8; Creatinine, Ser 1.04; Magnesium 2.1; Potassium 4.2; Sodium 135; TSH 6.57   Recent Lipid Panel Lab Results  Component Value Date/Time   CHOL 226 (H) 12/08/2018 03:32 PM   TRIG 110.0 12/08/2018 03:32 PM   HDL 41.00 12/08/2018 03:32 PM   CHOLHDL 6 12/08/2018 03:32 PM   LDLCALC 163 (H) 12/08/2018 03:32 PM  Wt Readings from Last 3 Encounters:  12/08/18 192 lb (87.1 kg)  07/20/18 191 lb (86.6 kg)  06/03/18 186 lb (84.4 kg)     Exam:    Vital Signs: Vital signs may also be detailed in the HPI There were no vitals taken for this visit.  Wt Readings from Last 3 Encounters:  12/08/18 192 lb (87.1 kg)  07/20/18 191 lb (86.6 kg)  06/03/18 186 lb (84.4 kg)   Temp Readings from Last 3 Encounters:  12/08/18 98.2 F (36.8 C) (Oral)  07/20/18 98.2 F (36.8 C) (Oral)  06/03/18 98.2 F (36.8 C) (Oral)   BP Readings from Last 3 Encounters:  12/08/18 140/84  07/20/18 128/84  06/03/18 130/84   Pulse Readings from Last 3 Encounters:  12/08/18 81  07/20/18 74  06/03/18 80    Blood pressure 130s over 80 Resting HR 62 Usually 80 to 90 Respirations 16  Well nourished, well developed female in no acute distress. Constitutional:  oriented to person, place, and time. No distress.  Head: Normocephalic and atraumatic.  Eyes:  no discharge. No scleral icterus.  Neck: Normal range of motion. Neck supple.  Pulmonary/Chest: No audible wheezing, no distress, appears comfortable Musculoskeletal: Normal range of  motion.  no  tenderness or deformity.  Neurological:   Coordination normal. Full exam not performed Skin:  No rash Psychiatric:  normal mood and affect. behavior is normal. Thought content normal.    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  The importance of social distancing was discussed today.  Patient Risk:   After full review of this patients clinical status, I feel that they are at least moderate risk at this time.  Time:   Today, I have spent 60 minutes with the patient with telehealth technology discussing the cardiac and medical problems/diagnoses detailed above   10 min spent reviewing the chart prior to patient visit today   Medication Adjustments/Labs and Tests Ordered: Current medicines are reviewed at length with the patient today.  Concerns regarding medicines are outlined above.   Tests Ordered: No tests ordered   Medication Changes: No changes made   Disposition: Follow-up as needed We will call with the  results of the monitor   Signed, Ida Rogue, MD  01/05/2019 8:28 AM    [place] Office [address] [place], [place], [place] [address]

## 2019-01-05 ENCOUNTER — Telehealth (INDEPENDENT_AMBULATORY_CARE_PROVIDER_SITE_OTHER): Payer: BC Managed Care – PPO | Admitting: Cardiovascular Disease

## 2019-01-05 ENCOUNTER — Other Ambulatory Visit: Payer: Self-pay

## 2019-01-05 ENCOUNTER — Telehealth: Payer: Self-pay

## 2019-01-05 DIAGNOSIS — F411 Generalized anxiety disorder: Secondary | ICD-10-CM

## 2019-01-05 DIAGNOSIS — E039 Hypothyroidism, unspecified: Secondary | ICD-10-CM | POA: Diagnosis not present

## 2019-01-05 DIAGNOSIS — R002 Palpitations: Secondary | ICD-10-CM | POA: Diagnosis not present

## 2019-01-05 NOTE — Patient Instructions (Addendum)
We will order a Zio monitor for palpitations, chest pain Would mention to her that if she would like to do a period of time off of metoprolol she could do that or to stay on the metoprolol the whole time Would document when she is off the metoprolol This would allow [place] to see if it is helping to some degree For example could do several days or 1 week off the metoprolol It does take several days to come out of her system  Please send to  [address]  [place]   Medication Instructions:  No changes  If you need a refill on your cardiac medications before your next appointment, please call your pharmacy.    Lab work: No new labs needed    If you have labs (blood work) drawn today and your tests are completely normal, you will receive your results only by: [name] MyChart Message (if you have MyChart) OR . A paper copy in the mail If you have any lab test that is abnormal or we need to change your treatment, we will call you to review the results.   Testing/Procedures: Should We will order a Zio monitor for palpitations, chest pain   Follow-Up: At [facility], you and your health needs are our priority.  As part of our continuing mission to provide you with exceptional heart care, we have created designated Provider Care Teams.  These Care Teams include your primary Cardiologist (physician) and Advanced Practice Providers (APPs -  Physician Assistants and Nurse Practitioners) who all work together to provide you with the care you need, when you need it.  . You will need a follow up appointment as needed  . Providers on your designated Care Team:   . Murray Hodgkins, NP . Christell Faith, PA-C . Marrianne Mood, PA-C  Any Other Special Instructions Will Be Listed Below (If Applicable).  For educational health videos Log in to : [email or web address] Or : [email or web address], password : [email or web address]

## 2019-01-05 NOTE — Telephone Encounter (Signed)
Address update noted. zio-monitor registration completed.

## 2019-01-05 NOTE — Telephone Encounter (Signed)
Please send to [address] Altha Harm  [phone]

## 2019-01-05 NOTE — Telephone Encounter (Signed)
Lmtcb. Per Dr. Rockey Situ the patient will need a zio-monitor mailed to her home. Dx palpitations. The monitor cannot not be mailed to a PO Box. Pt will need to call back to provide a physical mailing address.

## 2019-01-06 ENCOUNTER — Other Ambulatory Visit: Payer: Self-pay

## 2019-01-06 ENCOUNTER — Other Ambulatory Visit (INDEPENDENT_AMBULATORY_CARE_PROVIDER_SITE_OTHER): Payer: BC Managed Care – PPO

## 2019-01-06 DIAGNOSIS — E039 Hypothyroidism, unspecified: Secondary | ICD-10-CM

## 2019-01-06 LAB — TSH: TSH: 2.5 u[IU]/mL (ref 0.35–4.50)

## 2019-01-09 ENCOUNTER — Encounter: Payer: Self-pay | Admitting: Internal Medicine

## 2019-01-09 MED ORDER — LEVOTHYROXINE SODIUM 100 MCG PO TABS: 100.0000 ug | ORAL_TABLET | Freq: Every day | ORAL | 0 refills | Status: DC

## 2019-01-10 ENCOUNTER — Other Ambulatory Visit: Payer: BC Managed Care – PPO

## 2019-01-10 NOTE — Telephone Encounter (Signed)
I spoke with [employer]- they state that the patient's monitor was marked as delivered today by [employer].   I called and spoke with the patient.  She states she did receive it today and it is already applied. She is aware to take this off on 01/1919 and send back to [employer] in the box provided.   The patient voices understanding of the above and is agreeable.

## 2019-01-10 NOTE — Telephone Encounter (Signed)
I attempted to call [employer] to follow up on the shipment date for the patient's monitor. Message stating the call couldn't be completed as "all circuits are full" x 2 attempts.  Will call back in a few minutes.

## 2019-01-10 NOTE — Telephone Encounter (Signed)
Patient has not received monitor .  Please call to discuss est timeframe

## 2019-01-26 ENCOUNTER — Encounter: Payer: Self-pay | Admitting: Internal Medicine

## 2019-02-03 ENCOUNTER — Encounter: Payer: Self-pay | Admitting: Internal Medicine

## 2019-02-12 ENCOUNTER — Other Ambulatory Visit: Payer: Self-pay | Admitting: Internal Medicine

## 2019-02-16 ENCOUNTER — Other Ambulatory Visit: Payer: Self-pay | Admitting: Internal Medicine

## 2019-02-17 NOTE — Telephone Encounter (Signed)
Flexeril last filled 11/28/2018 and Gabapentin 09/2018 with refills... please advise looks like they are for migraines PRN.Marland KitchenMarland Kitchen

## 2019-02-19 NOTE — Progress Notes (Signed)
Virtual Visit via Video Note   This visit type was conducted due to national recommendations for restrictions regarding the COVID-19 Pandemic (e.g. social distancing) in an effort to limit this patient's exposure and mitigate transmission in our community.  Due to her co-morbid illnesses, this patient is at least at moderate risk for complications without adequate follow up.  This format is felt to be most appropriate for this patient at this time.  All issues noted in this document were discussed and addressed.  A limited physical exam was performed with this format.  Please refer to the patient's chart for her consent to telehealth for [facility].   I connected with  [name] on 02/19/19 by a video enabled telemedicine application and verified that I am speaking with the correct person using two identifiers. I discussed the limitations of evaluation and management by telemedicine. The patient expressed understanding and agreed to proceed.   Evaluation Performed:  Follow-up visit  Date:  02/19/2019   ID:  [name], DOB Jan 14, 1971, MRN [id number]  Patient Location:  [address] [place] [address]   Provider location:   [facility], [place] office  PCP:  Jearld Fenton, NP  Cardiologist:  Patsy Baltimore   Chief Complaint:  palpitations   History of Present Illness:    [name] is a 48 y.o. female who presents via audio/video conferencing for a telehealth visit today.   The patient does not symptoms concerning for COVID-19 infection (fever, chills, cough, or new SHORTNESS OF BREATH).   Patient has a past medical history of H/a Anxiety hypothyroid Presents for follow-up of her palpitations , PACs  Event monitor May 2020 showed Normal sinus rhythm Avg HR of 77 bpm.   1 run of Ventricular Tachycardia occurred lasting 7 beats with a max rate of 164 bpm (avg 148 bpm).   9 Supraventricular Tachycardia runs occurred, the run  with the fastest interval lasting 7 beats with a max rate of 200 bpm, the longest lasting 10 beats with an avg rate of 131 bpm.  Isolated SVEs were occasional (2.1%, 37106), SVE Couplets were rare (<1.0%, 35), and SVE Triplets were rare (<1.0%, 8).  Isolated VEs were rare (<1.0%), and no VE Couplets or VE Triplets were present. -- Patient triggered events associated with normal sinus rhythm, sometimes with PACs  Feels PACs at night Sx worse when laying down, Less sx when active  TSH 2.5  Total chol 226 LDL 163   Prior CV studies:   The following studies were reviewed today:   Past Medical History:  Diagnosis Date  . Allergy   . Anxiety   . Frequent headaches   . Hypothyroidism    Past Surgical History:  Procedure Laterality Date  . APPENDECTOMY  2013  . lapband  2013  . TONSILLECTOMY  2012     No outpatient medications have been marked as taking for the 02/20/19 encounter (Appointment) with Minna Merritts, MD.     Allergies:   Ciprofloxacin, Keflex [cephalexin], and Trazodone and nefazodone   Social History   Tobacco Use  . Smoking status: Never Smoker  . Smokeless tobacco: Never Used  Substance Use Topics  . Alcohol use: Yes    Comment: occasional  . Drug use: No     Current Outpatient Medications on File Prior to Visit  Medication Sig Dispense Refill  . ALPRAZolam (XANAX) 0.25 MG tablet TAKE 1 TABLET (0.25 MG TOTAL) BY MOUTH 2 (TWO) TIMES  DAILY AS NEEDED. 20 tablet 0  . AMETHIA 0.15-0.03 &0.01 MG tablet Take 1 tablet by mouth daily. 1 Package 0  . butalbital-acetaminophen-caffeine (FIORICET, ESGIC) 50-325-40 MG tablet Take 1 tablet by mouth every 6 (six) hours as needed for headache. 20 tablet 2  . cyclobenzaprine (FLEXERIL) 10 MG tablet TAKE 1 TABLET BY MOUTH THREE TIMES A DAY AS NEEDED FOR MUSCLE SPASMS 15 tablet 0  . gabapentin (NEURONTIN) 100 MG capsule TAKE 1 CAPSULE BY MOUTH THREE TIMES A DAY 90 capsule 2  . levocetirizine (XYZAL) 5 MG tablet TAKE  1 TABLET BY MOUTH EVERY DAY IN THE EVENING 90 tablet 2  . levothyroxine (SYNTHROID) 100 MCG tablet Take 1 tablet (100 mcg total) by mouth daily. 90 tablet 0  . metoprolol succinate (TOPROL-XL) 25 MG 24 hr tablet Take 1 tablet (25 mg total) by mouth daily. 90 tablet 3  . pravastatin (PRAVACHOL) 40 MG tablet Take 1 tablet (40 mg total) by mouth daily. 90 tablet 0  . Prenatal Vit-Fe Fumarate-FA (PRENATAL MULTIVITAMIN) TABS tablet Take 1 tablet by mouth daily at 12 noon.    . sertraline (ZOLOFT) 100 MG tablet TAKE 1 TABLET (100 MG TOTAL) BY MOUTH DAILY 90 tablet 2  . valACYclovir (VALTREX) 500 MG tablet Take 500 mg by mouth daily.  3   No current facility-administered medications on file prior to visit.      Family Hx: The patient's family history includes Cancer in her father and mother; Depression in her mother; Hyperlipidemia in her father.  ROS:   Please see the history of present illness.    Review of Systems  Constitutional: Negative.   HENT: Negative.   Respiratory: Negative.   Cardiovascular: Positive for chest pain and palpitations.  Gastrointestinal: Negative.   Musculoskeletal: Negative.   Neurological: Negative.   Psychiatric/Behavioral: Negative.   All other systems reviewed and are negative.     Labs/Other Tests and Data Reviewed:    Recent Labs: 06/03/2018: ALT 7; Hemoglobin 11.6; Platelets 324.0 12/08/2018: BUN 8; Creatinine, Ser 1.04; Magnesium 2.1; Potassium 4.2; Sodium 135 01/06/2019: TSH 2.50   Recent Lipid Panel Lab Results  Component Value Date/Time   CHOL 226 (H) 12/08/2018 03:32 PM   TRIG 110.0 12/08/2018 03:32 PM   HDL 41.00 12/08/2018 03:32 PM   CHOLHDL 6 12/08/2018 03:32 PM   LDLCALC 163 (H) 12/08/2018 03:32 PM    Wt Readings from Last 3 Encounters:  12/08/18 192 lb (87.1 kg)  07/20/18 191 lb (86.6 kg)  06/03/18 186 lb (84.4 kg)     Exam:    Vital Signs: Vital signs may also be detailed in the HPI There were no vitals taken for this visit.  Wt  Readings from Last 3 Encounters:  12/08/18 192 lb (87.1 kg)  07/20/18 191 lb (86.6 kg)  06/03/18 186 lb (84.4 kg)   Temp Readings from Last 3 Encounters:  12/08/18 98.2 F (36.8 C) (Oral)  07/20/18 98.2 F (36.8 C) (Oral)  06/03/18 98.2 F (36.8 C) (Oral)   BP Readings from Last 3 Encounters:  12/08/18 140/84  07/20/18 128/84  06/03/18 130/84   Pulse Readings from Last 3 Encounters:  12/08/18 81  07/20/18 74  06/03/18 80    130/80, pulse 70-80 resp 16  Well nourished, well developed female in no acute distress. Constitutional:  oriented to person, place, and time. No distress.    ASSESSMENT & PLAN:    Palpitations APCs on EKG and on event monitor Recommend she stay on metoprolol succinate  25 daily We will add propranolol 20 mg before bed  GAD (generalized anxiety disorder) Possibly contributing to her palpitations at nighttime Not sleeping well goes to bed at 3 AM sometimes, gets up early  acquired hypothyroidism Unclear if this is playing a role in her palpitations Appears to be slightly hypothyroid since September 2019 TSH now normal range   COVID-19 Education: The signs and symptoms of COVID-19 were discussed with the patient and how to seek care for testing (follow up with PCP or arrange E-visit).  The importance of social distancing was discussed today.  Patient Risk:   After full review of this patients clinical status, I feel that they are at least moderate risk at this time.  Time:   Today, I have spent 25 minutes with the patient with telehealth technology discussing the cardiac and medical problems/diagnoses detailed above      Medication Adjustments/Labs and Tests Ordered: Current medicines are reviewed at length with the patient today.  Concerns regarding medicines are outlined above.   Tests Ordered: No tests ordered   Medication Changes: No changes made   Disposition: Follow-up as needed We will call with the results of the monitor    Signed, Ida Rogue, MD  02/19/2019 1:27 PM    [place] Office [address], [place], [place] [address]

## 2019-02-20 ENCOUNTER — Telehealth (INDEPENDENT_AMBULATORY_CARE_PROVIDER_SITE_OTHER): Payer: BC Managed Care – PPO | Admitting: Cardiovascular Disease

## 2019-02-20 ENCOUNTER — Other Ambulatory Visit: Payer: Self-pay

## 2019-02-20 DIAGNOSIS — I491 Atrial premature depolarization: Secondary | ICD-10-CM | POA: Diagnosis not present

## 2019-02-20 DIAGNOSIS — F411 Generalized anxiety disorder: Secondary | ICD-10-CM | POA: Diagnosis not present

## 2019-02-20 DIAGNOSIS — E039 Hypothyroidism, unspecified: Secondary | ICD-10-CM | POA: Diagnosis not present

## 2019-02-20 DIAGNOSIS — R002 Palpitations: Secondary | ICD-10-CM | POA: Diagnosis not present

## 2019-02-20 MED ORDER — PROPRANOLOL HCL 20 MG PO TABS: 20.0000 mg | ORAL_TABLET | Freq: Three times a day (TID) | ORAL | 3 refills | Status: DC | PRN

## 2019-02-20 NOTE — Patient Instructions (Addendum)
Medication Instructions:  Your physician has recommended you make the following change in your medication:  1. START Propranolol 20 mg three times a day as needed for palpitations.  **Take in the evening to see if it helps with palpitations when sleeping  If you need a refill on your cardiac medications before your next appointment, please call your pharmacy.    Lab work: No new labs needed   If you have labs (blood work) drawn today and your tests are completely normal, you will receive your results only by: [name] MyChart Message (if you have MyChart) OR . A paper copy in the mail If you have any lab test that is abnormal or we need to change your treatment, we will call you to review the results.   Testing/Procedures: No new testing needed   Follow-Up: At [facility], you and your health needs are our priority.  As part of our continuing mission to provide you with exceptional heart care, we have created designated Provider Care Teams.  These Care Teams include your primary Cardiologist (physician) and Advanced Practice Providers (APPs -  Physician Assistants and Nurse Practitioners) who all work together to provide you with the care you need, when you need it.  . You will need a follow up appointment as needed   . Providers on your designated Care Team:   . Murray Hodgkins, NP . Christell Faith, PA-C . Marrianne Mood, PA-C  Any Other Special Instructions Will Be Listed Below (If Applicable).  For educational health videos Log in to : [email or web address] Or : [email or web address], password : [email or web address]   Palpitations Palpitations are feelings that your heartbeat is not normal. Your heartbeat may feel like it is:  Uneven.  Faster than normal.  Fluttering.  Skipping a beat. This is usually not a serious problem. In some cases, you may need tests to rule out any serious problems. Follow these instructions at home: Pay attention to any changes in your condition. Take these actions to  help manage your symptoms: Eating and drinking  Avoid: ? Coffee, tea, soft drinks, and energy drinks. ? Chocolate. ? Alcohol. ? Diet pills. Lifestyle   Try to lower your stress. These things can help you relax: ? Yoga. ? Deep breathing and meditation. ? Exercise. ? Using words and images to create positive thoughts (guided imagery). ? Using your mind to control things in your body (biofeedback).  Do not use drugs.  Get plenty of rest and sleep. Keep a regular bed time. General instructions   Take over-the-counter and prescription medicines only as told by your doctor.  Do not use any products that contain nicotine or tobacco, such as cigarettes and e-cigarettes. If you need help quitting, ask your doctor.  Keep all follow-up visits as told by your doctor. This is important. You may need more tests if palpitations do not go away or get worse. Contact a doctor if:  Your symptoms last more than 24 hours.  Your symptoms occur more often. Get help right away if you:  Have chest pain.  Feel short of breath.  Have a very bad headache.  Feel dizzy.  Pass out (faint). Summary  Palpitations are feelings that your heartbeat is uneven or faster than normal. It may feel like your heart is fluttering or skipping a beat.  Avoid food and drinks that may cause palpitations. These include caffeine, chocolate, and alcohol.  Try to lower your stress. Do not smoke or use drugs.  Get help right away if you faint  or have chest pain, shortness of breath, a severe headache, or dizziness. This information is not intended to replace advice given to you by your health care provider. Make sure you discuss any questions you have with your health care provider. Document Released: 06/02/2008 Document Revised: 10/06/2017 Document Reviewed: 10/06/2017 Elsevier Interactive Patient Education  2019 [employer]  Premature Ventricular Contraction  A premature ventricular contraction (PVC) is  a common kind of irregular heartbeat (arrhythmia). These contractions are extra heartbeats that start in the ventricles of the heart and occur too early in the normal sequence. During the PVC, the heart's normal electrical pathway is not used, so the beat is shorter and less effective. In most cases, these contractions come and go and do not require treatment. What are the causes? Common causes of the condition include:  Smoking.  Drinking alcohol.  Certain medicines.  Some illegal drugs.  Stress.  Caffeine. Certain medical conditions can also cause PVCs:  Heart failure.  Heart attack, or coronary artery disease.  Heart valve problems.  Changes in minerals in the blood (electrolytes).  Low blood oxygen levels or high carbon dioxide levels. In many cases, the cause of this condition is not known. What are the signs or symptoms? The main symptom of this condition is fast or skipped heartbeats (palpitations). Other symptoms include:  Chest pain.  Shortness of breath.  Feeling tired.  Dizziness.  Difficulty exercising. In some cases, there are no symptoms. How is this diagnosed? This condition may be diagnosed based on:  Your medical history.  A physical exam. During the exam, the health care provider will check for irregular heartbeats.  Tests, such as: ? An ECG (electrocardiogram) to monitor the electrical activity of your heart. ? Ambulatory cardiac monitor. This device records your heartbeats for 24 hours or more. ? Stress tests to see how exercise affects your heart rhythm and blood supply. ? Echocardiogram. This test uses sound waves (ultrasound) to produce an image of your heart. ? Electrophysiology study (EPS). This test checks for electrical problems in your heart. How is this treated? Treatment for this condition depends on any underlying conditions, the type of PVCs that you are having, and how much the symptoms are interfering with your daily  life. Possible treatments include:  Avoiding things that cause premature contractions (triggers). These include caffeine and alcohol.  Taking medicines if symptoms are severe or if the extra heartbeats are frequent.  Getting treatment for underlying conditions that cause PVCs.  Having an implantable cardioverter defibrillator (ICD), if you are at risk for a serious arrhythmia. The ICD is a small device that is inserted into your chest to monitor your heartbeat. When it senses an irregular heartbeat, it sends a shock to bring the heartbeat back to normal.  Having a procedure to destroy the portion of the heart tissue that sends out abnormal signals (catheter ablation). In some cases, no treatment is required. Follow these instructions at home: Alcohol use   Do not drink alcohol if: ? Your health care provider tells you not to drink. ? You are pregnant, may be pregnant, or are planning to become pregnant. ? Alcohol triggers your episodes.  If you drink alcohol, limit how much you have. You may drink: ? 0-1 drink a day for women. ? 0-2 drinks a day for men.  Be aware of how much alcohol is in your drink. In the [place] one drink equals one typical bottle of beer (12 oz), one-half glass of wine (5 oz), or one  shot of hard liquor (1 oz). General instructions  Do not use any products that contain nicotine or tobacco, such as cigarettes and e-cigarettes. If you need help quitting, ask your health care provider.  Find healthy ways to manage stress. Avoid stressful situations when possible.  Exercise regularly. Ask your health care provider what type of exercise is safe for you.  Try to get at least 7-9 hours of sleep each night, or as much as recommended by your health care provider.  If caffeine triggers episodes of PVC, do not eat, drink, or use anything with caffeine in it.  Do not use illegal drugs.  Take over-the-counter and prescription medicines only as told by your health care  provider.  Keep all follow-up visits as told by your health care provider. This is important. Contact a health care provider if you:  Feel palpitations. Get help right away if you:  Have chest pain.  Have shortness of breath.  Have sweating for no reason.  Have nausea and vomiting.  Become light-headed or you faint. Summary  A premature ventricular contraction (PVC) is a common kind of irregular heartbeat (arrhythmia).  In most cases, these contractions come and go and do not require treatment.  You may need to wear an ambulatory cardiac monitor. This records your heartbeats for 24 hours or more.  Treatment depends on any underlying conditions, the type of PVCs that you are having, and how much the symptoms are interfering with your daily life. This information is not intended to replace advice given to you by your health care provider. Make sure you discuss any questions you have with your health care provider. Document Released: 04/10/2004 Document Revised: 04/08/2018 Document Reviewed: 10/12/2017 Elsevier Interactive Patient Education  2019 [employer].

## 2019-03-04 ENCOUNTER — Other Ambulatory Visit: Payer: Self-pay | Admitting: Family Medicine

## 2019-03-04 DIAGNOSIS — Z20822 Contact with and (suspected) exposure to covid-19: Secondary | ICD-10-CM

## 2019-03-05 ENCOUNTER — Other Ambulatory Visit: Payer: Self-pay | Admitting: Internal Medicine

## 2019-03-06 ENCOUNTER — Encounter: Payer: Self-pay | Admitting: Internal Medicine

## 2019-03-10 LAB — NOVEL CORONAVIRUS, NAA: SARS-CoV-2, NAA: NOT DETECTED

## 2019-03-12 ENCOUNTER — Encounter: Payer: Self-pay | Admitting: Internal Medicine

## 2019-03-13 ENCOUNTER — Other Ambulatory Visit: Payer: Self-pay | Admitting: Internal Medicine

## 2019-03-13 DIAGNOSIS — E039 Hypothyroidism, unspecified: Secondary | ICD-10-CM

## 2019-03-14 MED ORDER — METOPROLOL SUCCINATE ER 25 MG PO TB24: 25.0000 mg | ORAL_TABLET | Freq: Two times a day (BID) | ORAL | 1 refills | Status: DC

## 2019-03-15 ENCOUNTER — Other Ambulatory Visit (INDEPENDENT_AMBULATORY_CARE_PROVIDER_SITE_OTHER): Payer: BC Managed Care – PPO

## 2019-03-15 ENCOUNTER — Other Ambulatory Visit: Payer: Self-pay

## 2019-03-15 ENCOUNTER — Other Ambulatory Visit: Payer: Self-pay | Admitting: Internal Medicine

## 2019-03-15 DIAGNOSIS — E039 Hypothyroidism, unspecified: Secondary | ICD-10-CM

## 2019-03-15 NOTE — Telephone Encounter (Signed)
Xanax last filled 11/22/2018 and Flexeril 02/17/2019 #15... please advise

## 2019-03-16 LAB — T3: T3, Total: 75 ng/dL — ABNORMAL LOW (ref 76–181)

## 2019-03-16 LAB — T4, FREE: Free T4: 0.66 ng/dL (ref 0.60–1.60)

## 2019-03-16 LAB — TSH: TSH: 5.73 u[IU]/mL — ABNORMAL HIGH (ref 0.35–4.50)

## 2019-03-17 MED ORDER — LEVOTHYROXINE SODIUM 112 MCG PO TABS: 112.0000 ug | ORAL_TABLET | Freq: Every day | ORAL | 2 refills | Status: DC

## 2019-03-17 NOTE — Addendum Note (Signed)
Addended by: Jearld Fenton on: 03/17/2019 01:47 PM   Modules accepted: Orders

## 2019-03-21 ENCOUNTER — Encounter: Payer: Self-pay | Admitting: Internal Medicine

## 2019-03-23 ENCOUNTER — Encounter: Payer: Self-pay | Admitting: Internal Medicine

## 2019-03-27 ENCOUNTER — Ambulatory Visit (INDEPENDENT_AMBULATORY_CARE_PROVIDER_SITE_OTHER): Payer: BC Managed Care – PPO | Admitting: Internal Medicine

## 2019-03-27 ENCOUNTER — Other Ambulatory Visit: Payer: Self-pay

## 2019-03-27 VITALS — BP 136/82 | HR 74 | Temp 98.2°F | Wt 215.0 lb

## 2019-03-27 DIAGNOSIS — R21 Rash and other nonspecific skin eruption: Secondary | ICD-10-CM

## 2019-03-27 NOTE — Patient Instructions (Signed)
Rash, Adult  A rash is a change in the color of your skin. A rash can also change the way your skin feels. There are many different conditions and factors that can cause a rash. Follow these instructions at home: The goal of treatment is to stop the itching and keep the rash from spreading. Watch for any changes in your symptoms. Let your doctor know about them. Follow these instructions to help with your condition: Medicine Take or apply over-the-counter and prescription medicines only as told by your doctor. These may include medicines:  To treat red or swollen skin (corticosteroid creams).  To treat itching.  To treat an allergy (oral antihistamines).  To treat very bad symptoms (oral corticosteroids).  Skin care  Put cool cloths (compresses) on the affected areas.  Do not scratch or rub your skin.  Avoid covering the rash. Make sure that the rash is exposed to air as much as possible. Managing itching and discomfort  Avoid hot showers or baths. These can make itching worse. A cold shower may help.  Try taking a bath with: ? Epsom salts. You can get these at your local pharmacy or grocery store. Follow the instructions on the package. ? Baking soda. Pour a small amount into the bath as told by your doctor. ? Colloidal oatmeal. You can get this at your local pharmacy or grocery store. Follow the instructions on the package.  Try putting baking soda paste onto your skin. Stir water into baking soda until it gets like a paste.  Try putting on a lotion that relieves itchiness (calamine lotion).  Keep cool and out of the sun. Sweating and being hot can make itching worse. General instructions   Rest as needed.  Drink enough fluid to keep your pee (urine) pale yellow.  Wear loose-fitting clothing.  Avoid scented soaps, detergents, and perfumes. Use gentle soaps, detergents, perfumes, and other cosmetic products.  Avoid anything that causes your rash. Keep a journal to  help track what causes your rash. Write down: ? What you eat. ? What cosmetic products you use. ? What you drink. ? What you wear. This includes jewelry.  Keep all follow-up visits as told by your doctor. This is important. Contact a doctor if:  You sweat at night.  You lose weight.  You pee (urinate) more than normal.  You pee less than normal, or you notice that your pee is a darker color than normal.  You feel weak.  You throw up (vomit).  Your skin or the whites of your eyes look yellow (jaundice).  Your skin: ? Tingles. ? Is numb.  Your rash: ? Does not go away after a few days. ? Gets worse.  You are: ? More thirsty than normal. ? More tired than normal.  You have: ? New symptoms. ? Pain in your belly (abdomen). ? A fever. ? Watery poop (diarrhea). Get help right away if:  You have a fever and your symptoms suddenly get worse.  You start to feel mixed up (confused).  You have a very bad headache or a stiff neck.  You have very bad joint pains or stiffness.  You have jerky movements that you cannot control (seizure).  Your rash covers all or most of your body. The rash may or may not be painful.  You have blisters that: ? Are on top of the rash. ? Grow larger. ? Grow together. ? Are painful. ? Are inside your nose or mouth.  You have a rash   that: ? Looks like purple pinprick-sized spots all over your body. ? Has a "bull's eye" or looks like a target. ? Is red and painful, causes your skin to peel, and is not from being in the sun too long. Summary  A rash is a change in the color of your skin. A rash can also change the way your skin feels.  The goal of treatment is to stop the itching and keep the rash from spreading.  Take or apply over-the-counter and prescription medicines only as told by your doctor.  Contact a doctor if you have new symptoms or symptoms that get worse.  Keep all follow-up visits as told by your doctor. This is  important. This information is not intended to replace advice given to you by your health care provider. Make sure you discuss any questions you have with your health care provider. Document Released: 02/10/2008 Document Revised: 12/16/2018 Document Reviewed: 03/28/2018 Elsevier Patient Education  2020 Elsevier Inc.  

## 2019-03-27 NOTE — Progress Notes (Signed)
Subjective:    Patient ID: [name], female    DOB: [birth year], 48 y.o.   MRN: [id number]  HPI  Patient presents to the clinic today for a complaint of a rash on both of her legs. She noticed this 1 week ago while she was out camping. She denies itching.  It has seem to spread up her legs. She denies changes in medications, diet, soaps, lotions or detergents. No one around her has a similar rash. She has tried OTC Hydrocortisone cream wit improvement.  Review of Systems      Past Medical History:  Diagnosis Date  . Allergy   . Anxiety   . Frequent headaches   . Hypothyroidism     Current Outpatient Medications  Medication Sig Dispense Refill  . ALPRAZolam (XANAX) 0.25 MG tablet TAKE 1 TABLET (0.25 MG TOTAL) BY MOUTH 2 (TWO) TIMES DAILY AS NEEDED. 20 tablet 0  . AMETHIA 0.15-0.03 &0.01 MG tablet Take 1 tablet by mouth daily. 1 Package 0  . butalbital-acetaminophen-caffeine (FIORICET, ESGIC) 50-325-40 MG tablet Take 1 tablet by mouth every 6 (six) hours as needed for headache. 20 tablet 2  . cyclobenzaprine (FLEXERIL) 10 MG tablet TAKE 1 TABLET BY MOUTH THREE TIMES A DAY AS NEEDED FOR MUSCLE SPASMS 15 tablet 0  . gabapentin (NEURONTIN) 100 MG capsule TAKE 1 CAPSULE BY MOUTH THREE TIMES A DAY 90 capsule 2  . levocetirizine (XYZAL) 5 MG tablet TAKE 1 TABLET BY MOUTH EVERY DAY IN THE EVENING 90 tablet 2  . levothyroxine (SYNTHROID) 112 MCG tablet Take 1 tablet (112 mcg total) by mouth daily. 30 tablet 2  . metoprolol succinate (TOPROL-XL) 25 MG 24 hr tablet Take 1 tablet (25 mg total) by mouth 2 (two) times a day. 180 tablet 1  . pravastatin (PRAVACHOL) 40 MG tablet TAKE 1 TABLET BY MOUTH EVERY DAY 90 tablet 0  . Prenatal Vit-Fe Fumarate-FA (PRENATAL MULTIVITAMIN) TABS tablet Take 1 tablet by mouth daily at 12 noon.    . propranolol (INDERAL) 20 MG tablet Take 1 tablet (20 mg total) by mouth 3 (three) times daily as needed (As needed for palpitations). 270 tablet 3  .  sertraline (ZOLOFT) 100 MG tablet TAKE 1 TABLET (100 MG TOTAL) BY MOUTH DAILY 90 tablet 2  . valACYclovir (VALTREX) 500 MG tablet Take 500 mg by mouth daily.  3   No current facility-administered medications for this visit.     Allergies  Allergen Reactions  . Ciprofloxacin     anxiety  . Keflex [Cephalexin] Hives  . Trazodone And Nefazodone Other (See Comments)    nightmares    Family History  Problem Relation Age of Onset  . Cancer Mother   . Depression Mother   . Cancer Father   . Hyperlipidemia Father     Social History   Socioeconomic History  . Marital status: Single    Spouse name: Not on file  . Number of children: Not on file  . Years of education: Not on file  . Highest education level: Not on file  Occupational History  . Not on file  Social Needs  . Financial resource strain: Not on file  . Food insecurity    Worry: Not on file    Inability: Not on file  . Transportation needs    Medical: Not on file    Non-medical: Not on file  Tobacco Use  . Smoking status: Never Smoker  . Smokeless tobacco: Never Used  Substance and Sexual Activity  .  Alcohol use: Yes    Comment: occasional  . Drug use: No  . Sexual activity: Yes    Partners: Male  Lifestyle  . Physical activity    Days per week: Not on file    Minutes per session: Not on file  . Stress: Not on file  Relationships  . Social Herbalist on phone: Not on file    Gets together: Not on file    Attends religious service: Not on file    Active member of club or organization: Not on file    Attends meetings of clubs or organizations: Not on file    Relationship status: Not on file  . Intimate partner violence    Fear of current or ex partner: Not on file    Emotionally abused: Not on file    Physically abused: Not on file    Forced sexual activity: Not on file  Other Topics Concern  . Not on file  Social History Narrative  . Not on file     Constitutional: Denies fever,  malaise, fatigue, headache or abrupt weight changes.  Cardiovascular: Denies chest pain, chest tightness, palpitations or swelling in the hands or feet.  Skin: Complains of rash on bilateral legs without itching or pain that resolves spontaneously over time.     No other specific complaints in a complete review of systems (except as listed in HPI above).   Objective:   Physical Exam  BP 136/82   Pulse 74   Temp 98.2 F (36.8 C) (Temporal)   Wt 215 lb (97.5 kg)   SpO2 98%   BMI 35.78 kg/m    Wt Readings from Last 3 Encounters:  12/08/18 192 lb (87.1 kg)  07/20/18 191 lb (86.6 kg)  06/03/18 186 lb (84.4 kg)    General: Appears her stated age, obese. Skin: Small pink, flat spots noted to bilateral legs in various stages of healing.      BMET    Component Value Date/Time   NA 135 12/08/2018 1532   K 4.2 12/08/2018 1532   CL 99 12/08/2018 1532   CO2 28 12/08/2018 1532   GLUCOSE 75 12/08/2018 1532   BUN 8 12/08/2018 1532   CREATININE 1.04 12/08/2018 1532   CALCIUM 8.8 12/08/2018 1532    Lipid Panel     Component Value Date/Time   CHOL 226 (H) 12/08/2018 1532   TRIG 110.0 12/08/2018 1532   HDL 41.00 12/08/2018 1532   CHOLHDL 6 12/08/2018 1532   VLDL 22.0 12/08/2018 1532   LDLCALC 163 (H) 12/08/2018 1532    CBC    Component Value Date/Time   WBC 4.8 06/03/2018 1424   RBC 4.34 06/03/2018 1424   HGB 11.6 (L) 06/03/2018 1424   HCT 34.8 (L) 06/03/2018 1424   PLT 324.0 06/03/2018 1424   MCV 80.3 06/03/2018 1424   MCHC 33.3 06/03/2018 1424   RDW 15.8 (H) 06/03/2018 1424    Hgb A1C Lab Results  Component Value Date   HGBA1C 5.4 04/22/2017           Assessment & Plan:   Rash to BLE:  Appears to be improving.  Can continue to use OTC Hydrocortisone cream for now. Will monitor  Return if symptoms worsen or persist.  Webb Silversmith, NP

## 2019-04-02 ENCOUNTER — Other Ambulatory Visit: Payer: Self-pay | Admitting: Internal Medicine

## 2019-04-24 ENCOUNTER — Encounter: Payer: Self-pay | Admitting: Internal Medicine

## 2019-04-25 ENCOUNTER — Ambulatory Visit (INDEPENDENT_AMBULATORY_CARE_PROVIDER_SITE_OTHER): Payer: BC Managed Care – PPO | Admitting: Internal Medicine

## 2019-04-25 ENCOUNTER — Encounter: Payer: Self-pay | Admitting: Internal Medicine

## 2019-04-25 DIAGNOSIS — J329 Chronic sinusitis, unspecified: Secondary | ICD-10-CM | POA: Diagnosis not present

## 2019-04-25 DIAGNOSIS — B9789 Other viral agents as the cause of diseases classified elsewhere: Secondary | ICD-10-CM | POA: Diagnosis not present

## 2019-04-25 MED ORDER — FLUTICASONE PROPIONATE 50 MCG/ACT NA SUSP: 2.0000 | Freq: Every day | NASAL | 6 refills | Status: DC

## 2019-04-25 MED ORDER — BENZONATATE 200 MG PO CAPS: 200.0000 mg | ORAL_CAPSULE | Freq: Three times a day (TID) | ORAL | 0 refills | Status: DC | PRN

## 2019-04-25 NOTE — Progress Notes (Signed)
Virtual Visit via Video Note  I connected with [name] on 04/25/19 at  4:15 PM EDT by a video enabled telemedicine application and verified that I am speaking with the correct person using two identifiers.  Location: Patient: Home Provider: Office   I discussed the limitations of evaluation and management by telemedicine and the availability of in person appointments. The patient expressed understanding and agreed to proceed.  History of Present Illness:  Pt reports facial pressure, ear fullness, runny nose, post nasal drip and cough. This started 2 weeks ago. She is blowing clear mucous out of her nose. She denies difficulty swallowing. The cough is non productive. She denies headache, nasal congestion, loss of taste or smell or shortness of breath. She denies fever, chills or body aches. She has taken Xyzal with minimal relief. She has not had sick contacts.  Past Medical History:  Diagnosis Date  . Allergy   . Anxiety   . Frequent headaches   . Hypothyroidism     Current Outpatient Medications  Medication Sig Dispense Refill  . ALPRAZolam (XANAX) 0.25 MG tablet TAKE 1 TABLET (0.25 MG TOTAL) BY MOUTH 2 (TWO) TIMES DAILY AS NEEDED. 20 tablet 0  . AMETHIA 0.15-0.03 &0.01 MG tablet Take 1 tablet by mouth daily. 1 Package 0  . butalbital-acetaminophen-caffeine (FIORICET, ESGIC) 50-325-40 MG tablet Take 1 tablet by mouth every 6 (six) hours as needed for headache. 20 tablet 2  . cyclobenzaprine (FLEXERIL) 10 MG tablet TAKE 1 TABLET BY MOUTH THREE TIMES A DAY AS NEEDED FOR MUSCLE SPASMS 15 tablet 0  . gabapentin (NEURONTIN) 100 MG capsule TAKE 1 CAPSULE BY MOUTH THREE TIMES A DAY 90 capsule 2  . levocetirizine (XYZAL) 5 MG tablet TAKE 1 TABLET BY MOUTH EVERY DAY IN THE EVENING 90 tablet 2  . levothyroxine (SYNTHROID) 112 MCG tablet Take 1 tablet (112 mcg total) by mouth daily. 30 tablet 2  . metoprolol succinate (TOPROL-XL) 25 MG 24 hr tablet Take 1 tablet (25 mg total) by mouth  2 (two) times a day. 180 tablet 1  . pravastatin (PRAVACHOL) 40 MG tablet TAKE 1 TABLET BY MOUTH EVERY DAY 90 tablet 0  . Prenatal Vit-Fe Fumarate-FA (PRENATAL MULTIVITAMIN) TABS tablet Take 1 tablet by mouth daily at 12 noon.    . propranolol (INDERAL) 20 MG tablet Take 1 tablet (20 mg total) by mouth 3 (three) times daily as needed (As needed for palpitations). 270 tablet 3  . sertraline (ZOLOFT) 100 MG tablet TAKE 1 TABLET (100 MG TOTAL) BY MOUTH DAILY 90 tablet 2  . valACYclovir (VALTREX) 500 MG tablet Take 500 mg by mouth daily.  3  . benzonatate (TESSALON) 200 MG capsule Take 1 capsule (200 mg total) by mouth 3 (three) times daily as needed. 30 capsule 0  . fluticasone (FLONASE) 50 MCG/ACT nasal spray Place 2 sprays into both nostrils daily. 16 g 6   No current facility-administered medications for this visit.     Allergies  Allergen Reactions  . Ciprofloxacin     anxiety  . Keflex [Cephalexin] Hives  . Trazodone And Nefazodone Other (See Comments)    nightmares    Family History  Problem Relation Age of Onset  . Cancer Mother   . Depression Mother   . Cancer Father   . Hyperlipidemia Father     Social History   Socioeconomic History  . Marital status: Single    Spouse name: Not on file  . Number of children: Not on file  .  Years of education: Not on file  . Highest education level: Not on file  Occupational History  . Not on file  Social Needs  . Financial resource strain: Not on file  . Food insecurity    Worry: Not on file    Inability: Not on file  . Transportation needs    Medical: Not on file    Non-medical: Not on file  Tobacco Use  . Smoking status: Never Smoker  . Smokeless tobacco: Never Used  Substance and Sexual Activity  . Alcohol use: Yes    Comment: occasional  . Drug use: No  . Sexual activity: Yes    Partners: Male  Lifestyle  . Physical activity    Days per week: Not on file    Minutes per session: Not on file  . Stress: Not on file   Relationships  . Social Herbalist on phone: Not on file    Gets together: Not on file    Attends religious service: Not on file    Active member of club or organization: Not on file    Attends meetings of clubs or organizations: Not on file    Relationship status: Not on file  . Intimate partner violence    Fear of current or ex partner: Not on file    Emotionally abused: Not on file    Physically abused: Not on file    Forced sexual activity: Not on file  Other Topics Concern  . Not on file  Social History Narrative  . Not on file     Constitutional: Denies fever, malaise, fatigue, headache or abrupt weight changes.  HEENT: Pt reports facial pressure, ear fullness, runny nose, sore throat. Denies eye pain, eye redness, ear pain, ringing in the ears, wax buildup,  nasal congestion, bloody nose Respiratory: Pt reports cough. Denies difficulty breathing, shortness of breath,  or sputum production.   Cardiovascular: Denies chest pain, chest tightness, palpitations or swelling in the hands or feet.   No other specific complaints in a complete review of systems (except as listed in HPI above).    Observations/Objective:  Wt Readings from Last 3 Encounters:  03/27/19 215 lb (97.5 kg)  12/08/18 192 lb (87.1 kg)  07/20/18 191 lb (86.6 kg)    General: Appears her stated age, obese, in NAD. HEENT: Head: normal shape and size;  Pulmonary/Chest: Normal efforts. No respiratory distress.  Neurological: Alert and oriented.    BMET    Component Value Date/Time   NA 135 12/08/2018 1532   K 4.2 12/08/2018 1532   CL 99 12/08/2018 1532   CO2 28 12/08/2018 1532   GLUCOSE 75 12/08/2018 1532   BUN 8 12/08/2018 1532   CREATININE 1.04 12/08/2018 1532   CALCIUM 8.8 12/08/2018 1532    Lipid Panel     Component Value Date/Time   CHOL 226 (H) 12/08/2018 1532   TRIG 110.0 12/08/2018 1532   HDL 41.00 12/08/2018 1532   CHOLHDL 6 12/08/2018 1532   VLDL 22.0 12/08/2018 1532    LDLCALC 163 (H) 12/08/2018 1532    CBC    Component Value Date/Time   WBC 4.8 06/03/2018 1424   RBC 4.34 06/03/2018 1424   HGB 11.6 (L) 06/03/2018 1424   HCT 34.8 (L) 06/03/2018 1424   PLT 324.0 06/03/2018 1424   MCV 80.3 06/03/2018 1424   MCHC 33.3 06/03/2018 1424   RDW 15.8 (H) 06/03/2018 1424    Hgb A1C Lab Results  Component Value Date  HGBA1C 5.4 04/22/2017        Assessment and Plan:  Viral Sinusitis:  Continue Xyzal RX for Flonase Rx for Tessalon Pearls 200 mg TID prn  Return precautions discussed  Follow Up Instructions:    I discussed the assessment and treatment plan with the patient. The patient was provided an opportunity to ask questions and all were answered. The patient agreed with the plan and demonstrated an understanding of the instructions.   The patient was advised to call back or seek an in-person evaluation if the symptoms worsen or if the condition fails to improve as anticipated.     Webb Silversmith, NP

## 2019-04-27 ENCOUNTER — Encounter: Payer: Self-pay | Admitting: Internal Medicine

## 2019-04-27 NOTE — Patient Instructions (Signed)

## 2019-04-28 ENCOUNTER — Encounter: Payer: Self-pay | Admitting: Internal Medicine

## 2019-04-29 MED ORDER — AMOXICILLIN-POT CLAVULANATE 875-125 MG PO TABS: 1.0000 | ORAL_TABLET | Freq: Two times a day (BID) | ORAL | 0 refills | Status: DC

## 2019-05-02 ENCOUNTER — Other Ambulatory Visit: Payer: Self-pay

## 2019-05-02 ENCOUNTER — Encounter: Payer: Self-pay | Admitting: Internal Medicine

## 2019-05-02 DIAGNOSIS — G8929 Other chronic pain: Secondary | ICD-10-CM

## 2019-05-02 DIAGNOSIS — R519 Headache, unspecified: Secondary | ICD-10-CM

## 2019-05-02 MED ORDER — BUTALBITAL-APAP-CAFFEINE 50-325-40 MG PO TABS: 1.0000 | ORAL_TABLET | Freq: Four times a day (QID) | ORAL | 2 refills | Status: DC | PRN

## 2019-05-02 MED ORDER — SERTRALINE HCL 100 MG PO TABS: ORAL_TABLET | ORAL | 1 refills | Status: DC

## 2019-05-02 NOTE — Telephone Encounter (Signed)
Last filled 07/20/2018 with 2 refills... please advise

## 2019-05-05 ENCOUNTER — Encounter: Payer: Self-pay | Admitting: Internal Medicine

## 2019-05-08 ENCOUNTER — Encounter: Payer: Self-pay | Admitting: Internal Medicine

## 2019-05-08 ENCOUNTER — Ambulatory Visit (INDEPENDENT_AMBULATORY_CARE_PROVIDER_SITE_OTHER): Payer: BC Managed Care – PPO | Admitting: Internal Medicine

## 2019-05-08 ENCOUNTER — Other Ambulatory Visit: Payer: Self-pay

## 2019-05-08 VITALS — BP 132/82 | HR 86 | Temp 98.2°F | Wt 220.0 lb

## 2019-05-08 DIAGNOSIS — L0231 Cutaneous abscess of buttock: Secondary | ICD-10-CM | POA: Diagnosis not present

## 2019-05-08 DIAGNOSIS — L03317 Cellulitis of buttock: Secondary | ICD-10-CM | POA: Diagnosis not present

## 2019-05-08 MED ORDER — SULFAMETHOXAZOLE-TRIMETHOPRIM 800-160 MG PO TABS: 1.0000 | ORAL_TABLET | Freq: Two times a day (BID) | ORAL | 0 refills | Status: DC

## 2019-05-08 MED ORDER — CEFTRIAXONE SODIUM 1 G IJ SOLR
1.0000 g | Freq: Once | INTRAMUSCULAR | Status: AC
  Administered 2019-05-08: 1 g via INTRAMUSCULAR

## 2019-05-08 MED ORDER — HYDROCODONE-ACETAMINOPHEN 5-325 MG PO TABS: 1.0000 | ORAL_TABLET | Freq: Three times a day (TID) | ORAL | 0 refills | Status: DC | PRN

## 2019-05-08 MED ORDER — PREDNISONE 10 MG PO TABS: ORAL_TABLET | ORAL | 0 refills | Status: DC

## 2019-05-08 NOTE — Patient Instructions (Signed)

## 2019-05-08 NOTE — Addendum Note (Signed)
Addended by: Lurlean Nanny on: 05/08/2019 03:37 PM   Modules accepted: Orders

## 2019-05-08 NOTE — Progress Notes (Signed)
Subjective:    Patient ID: [name], female    DOB: November 09, 1970, 48 y.o.   MRN: [id number]  HPI  Pt presents to the clinic today with c/o a boil on her buttocks. She noticed this 3 days ago. The area is hard, warm and tender to touch. She has not noticed any drainage from the area. She denies fever, chills or body aches. She has tried warm compresses with minimal relief. Review of Systems      Past Medical History:  Diagnosis Date  . Allergy   . Anxiety   . Frequent headaches   . Hypothyroidism     Current Outpatient Medications  Medication Sig Dispense Refill  . ALPRAZolam (XANAX) 0.25 MG tablet TAKE 1 TABLET (0.25 MG TOTAL) BY MOUTH 2 (TWO) TIMES DAILY AS NEEDED. 20 tablet 0  . AMETHIA 0.15-0.03 &0.01 MG tablet Take 1 tablet by mouth daily. 1 Package 0  . amoxicillin-clavulanate (AUGMENTIN) 875-125 MG tablet Take 1 tablet by mouth 2 (two) times daily. 20 tablet 0  . benzonatate (TESSALON) 200 MG capsule Take 1 capsule (200 mg total) by mouth 3 (three) times daily as needed. 30 capsule 0  . butalbital-acetaminophen-caffeine (FIORICET) 50-325-40 MG tablet Take 1 tablet by mouth every 6 (six) hours as needed for headache. 20 tablet 2  . cyclobenzaprine (FLEXERIL) 10 MG tablet TAKE 1 TABLET BY MOUTH THREE TIMES A DAY AS NEEDED FOR MUSCLE SPASMS 15 tablet 0  . fluticasone (FLONASE) 50 MCG/ACT nasal spray Place 2 sprays into both nostrils daily. 16 g 6  . gabapentin (NEURONTIN) 100 MG capsule TAKE 1 CAPSULE BY MOUTH THREE TIMES A DAY 90 capsule 2  . levocetirizine (XYZAL) 5 MG tablet TAKE 1 TABLET BY MOUTH EVERY DAY IN THE EVENING 90 tablet 2  . levothyroxine (SYNTHROID) 112 MCG tablet Take 1 tablet (112 mcg total) by mouth daily. 30 tablet 2  . metoprolol succinate (TOPROL-XL) 25 MG 24 hr tablet Take 1 tablet (25 mg total) by mouth 2 (two) times a day. 180 tablet 1  . pravastatin (PRAVACHOL) 40 MG tablet TAKE 1 TABLET BY MOUTH EVERY DAY 90 tablet 0  . predniSONE (DELTASONE) 10  MG tablet Take 6 tabs day 1, 5 tabs day 2, 4 tabs day 3, 3 tabs day 4, 2 tabs day 5, 1 tab day 6 21 tablet 0  . Prenatal Vit-Fe Fumarate-FA (PRENATAL MULTIVITAMIN) TABS tablet Take 1 tablet by mouth daily at 12 noon.    . propranolol (INDERAL) 20 MG tablet Take 1 tablet (20 mg total) by mouth 3 (three) times daily as needed (As needed for palpitations). 270 tablet 3  . sertraline (ZOLOFT) 100 MG tablet TAKE 1 TABLET (100 MG TOTAL) BY MOUTH DAILY 90 tablet 1  . valACYclovir (VALTREX) 500 MG tablet Take 500 mg by mouth daily.  3   No current facility-administered medications for this visit.     Allergies  Allergen Reactions  . Ciprofloxacin     anxiety  . Keflex [Cephalexin] Hives  . Trazodone And Nefazodone Other (See Comments)    nightmares    Family History  Problem Relation Age of Onset  . Cancer Mother   . Depression Mother   . Cancer Father   . Hyperlipidemia Father     Social History   Socioeconomic History  . Marital status: Single    Spouse name: Not on file  . Number of children: Not on file  . Years of education: Not on file  . Highest education  level: Not on file  Occupational History  . Not on file  Social Needs  . Financial resource strain: Not on file  . Food insecurity    Worry: Not on file    Inability: Not on file  . Transportation needs    Medical: Not on file    Non-medical: Not on file  Tobacco Use  . Smoking status: Never Smoker  . Smokeless tobacco: Never Used  Substance and Sexual Activity  . Alcohol use: Yes    Comment: occasional  . Drug use: No  . Sexual activity: Yes    Partners: Male  Lifestyle  . Physical activity    Days per week: Not on file    Minutes per session: Not on file  . Stress: Not on file  Relationships  . Social Herbalist on phone: Not on file    Gets together: Not on file    Attends religious service: Not on file    Active member of club or organization: Not on file    Attends meetings of clubs or  organizations: Not on file    Relationship status: Not on file  . Intimate partner violence    Fear of current or ex partner: Not on file    Emotionally abused: Not on file    Physically abused: Not on file    Forced sexual activity: Not on file  Other Topics Concern  . Not on file  Social History Narrative  . Not on file     Constitutional: Denies fever, malaise, fatigue, headache or abrupt weight changes.  Skin: Pt reports boil of buttocks. Denies ulcercations.    No other specific complaints in a complete review of systems (except as listed in HPI above).  Objective:   Physical Exam  BP 132/82   Pulse 86   Temp 98.2 F (36.8 C) (Temporal)   Wt 220 lb (99.8 kg)   SpO2 98%   BMI 36.61 kg/m  Wt Readings from Last 3 Encounters:  05/08/19 220 lb (99.8 kg)  03/27/19 215 lb (97.5 kg)  12/08/18 192 lb (87.1 kg)    General: Appears her stated age, obese, in NAD. Skin: 1 cm raised fluctuant abscess noted of right gluteal fold with surrounding cellulitis. Cardiovascular: Normal rate and rhythm.  Pulmonary/Chest: Normal effort and positive vesicular breath sounds. No respiratory distress. No wheezes, rales or ronchi noted.  Neurological: Alert and oriented.    BMET    Component Value Date/Time   NA 135 12/08/2018 1532   K 4.2 12/08/2018 1532   CL 99 12/08/2018 1532   CO2 28 12/08/2018 1532   GLUCOSE 75 12/08/2018 1532   BUN 8 12/08/2018 1532   CREATININE 1.04 12/08/2018 1532   CALCIUM 8.8 12/08/2018 1532    Lipid Panel     Component Value Date/Time   CHOL 226 (H) 12/08/2018 1532   TRIG 110.0 12/08/2018 1532   HDL 41.00 12/08/2018 1532   CHOLHDL 6 12/08/2018 1532   VLDL 22.0 12/08/2018 1532   LDLCALC 163 (H) 12/08/2018 1532    CBC    Component Value Date/Time   WBC 4.8 06/03/2018 1424   RBC 4.34 06/03/2018 1424   HGB 11.6 (L) 06/03/2018 1424   HCT 34.8 (L) 06/03/2018 1424   PLT 324.0 06/03/2018 1424   MCV 80.3 06/03/2018 1424   MCHC 33.3 06/03/2018  1424   RDW 15.8 (H) 06/03/2018 1424    Hgb A1C Lab Results  Component Value Date   HGBA1C  5.4 04/22/2017            Assessment & Plan:    Abscess and Cellulitis, Right Buttock;  Discussed risk of procedure including pain, infection, bleeding, poor cosmetic result Informed consent obtained verbally Area numbed with 2% Lidocaine with Epi Area cleansed with Betadine x 3  Area incised with #11 blade Moderate amount of pus drained Pt tolerated well, no complications After care instructions given Area covered with TAB, guaze and paper tape  Rocephin 1 gm IM x 1 RX for Septra DS 1 tab PO BID x 10 days  Return precautions discussed Webb Silversmith, NP

## 2019-05-19 DIAGNOSIS — I491 Atrial premature depolarization: Secondary | ICD-10-CM

## 2019-05-22 ENCOUNTER — Encounter: Payer: Self-pay | Admitting: Internal Medicine

## 2019-05-27 ENCOUNTER — Other Ambulatory Visit: Payer: Self-pay | Admitting: Internal Medicine

## 2019-06-04 NOTE — Telephone Encounter (Signed)
Is she still having sx? If yes, Options include increasing metoprolol up to 37.5 mg twice a day  There may be other medication options (adding more meds, such as flecainide ) if needed Echo would need to be done first

## 2019-06-05 NOTE — Telephone Encounter (Signed)
Attempted to call patient. LMTCB 06/05/2019

## 2019-06-10 ENCOUNTER — Other Ambulatory Visit: Payer: Self-pay | Admitting: Internal Medicine

## 2019-06-13 ENCOUNTER — Ambulatory Visit (INDEPENDENT_AMBULATORY_CARE_PROVIDER_SITE_OTHER): Payer: BC Managed Care – PPO

## 2019-06-13 ENCOUNTER — Other Ambulatory Visit: Payer: Self-pay

## 2019-06-13 DIAGNOSIS — I491 Atrial premature depolarization: Secondary | ICD-10-CM

## 2019-06-14 ENCOUNTER — Other Ambulatory Visit: Payer: Self-pay | Admitting: Internal Medicine

## 2019-06-15 NOTE — Telephone Encounter (Signed)
Both last filled 03/15/2019.Marland KitchenMarland KitchenMarland Kitchen please advise

## 2019-06-19 NOTE — Telephone Encounter (Signed)
Call to patient to get more information regarding PACs. She reports that she is very sensitive to them but has not increase or change in sx. No chest pain, discomfort or SOB noted.   She is fine with current POC at this time.   Advised pt to call for any further questions or concerns.

## 2019-07-07 ENCOUNTER — Encounter: Payer: Self-pay | Admitting: Internal Medicine

## 2019-07-07 ENCOUNTER — Other Ambulatory Visit: Payer: Self-pay | Admitting: Cardiovascular Disease

## 2019-07-07 DIAGNOSIS — E039 Hypothyroidism, unspecified: Secondary | ICD-10-CM

## 2019-07-08 ENCOUNTER — Other Ambulatory Visit: Payer: Self-pay | Admitting: Internal Medicine

## 2019-07-27 ENCOUNTER — Telehealth: Payer: Self-pay

## 2019-07-27 NOTE — Telephone Encounter (Signed)
LVM w COVID screen, front door and back lab info 11.19.2020 TLJ

## 2019-08-01 ENCOUNTER — Other Ambulatory Visit: Payer: Self-pay | Admitting: Internal Medicine

## 2019-08-05 ENCOUNTER — Other Ambulatory Visit: Payer: Self-pay | Admitting: Cardiovascular Disease

## 2019-08-07 ENCOUNTER — Telehealth: Payer: Self-pay | Admitting: Internal Medicine

## 2019-08-07 ENCOUNTER — Other Ambulatory Visit: Payer: Self-pay

## 2019-08-07 ENCOUNTER — Other Ambulatory Visit (INDEPENDENT_AMBULATORY_CARE_PROVIDER_SITE_OTHER): Payer: BC Managed Care – PPO

## 2019-08-07 DIAGNOSIS — E039 Hypothyroidism, unspecified: Secondary | ICD-10-CM

## 2019-08-07 NOTE — Telephone Encounter (Signed)
Patient would like to see if there is a different medication that could be called in for migraines . She stated is has been prescribed Fioricet but it is not helping with her migraine. Patient has been dealing with a migraine since 12 last night and it is not improving.   [facility] [place]

## 2019-08-07 NOTE — Telephone Encounter (Signed)
I see she was prescribed Imitrex in the past. Was that effective or not?

## 2019-08-08 ENCOUNTER — Ambulatory Visit: Payer: BC Managed Care – PPO | Admitting: Internal Medicine

## 2019-08-08 ENCOUNTER — Other Ambulatory Visit: Payer: Self-pay

## 2019-08-08 ENCOUNTER — Encounter: Payer: Self-pay | Admitting: Internal Medicine

## 2019-08-08 DIAGNOSIS — G43001 Migraine without aura, not intractable, with status migrainosus: Secondary | ICD-10-CM | POA: Diagnosis not present

## 2019-08-08 LAB — TSH: TSH: 0.65 u[IU]/mL (ref 0.35–4.50)

## 2019-08-08 LAB — T3: T3, Total: 111 ng/dL (ref 76–181)

## 2019-08-08 LAB — T4, FREE: Free T4: 0.85 ng/dL (ref 0.60–1.60)

## 2019-08-08 NOTE — Patient Instructions (Signed)

## 2019-08-08 NOTE — Assessment & Plan Note (Signed)
Triggered by change in weather (cold) IM injection of Toradol 60mg  and Phenergan 12.5 mg Continue taking Fioricet and Gabapentin Will monitor

## 2019-08-08 NOTE — Telephone Encounter (Signed)
Agree, pt should be evaluated today. I can add her on if need be

## 2019-08-08 NOTE — Telephone Encounter (Signed)
As long as she has neg covid screening minus headache as this is a chronic issue

## 2019-08-08 NOTE — Progress Notes (Signed)
Subjective:    Patient ID: [name], female    DOB: 30-Jan-1971, 48 y.o.   MRN: [id number]  HPI  Pt presents to the clinic today with c/o a migraine. This started 2 days ago and she thinks it is because of the weather change (cold). She describes the pain as sharp and achy pain located on right side of upper face and around eye. She reports associated light sensitivity and nausea with one episode of vomiting yesterday. She also reports right eye twitching when she has migraines. She denies vision changes, sensitivity to sound or dizziness. She has been taking Fioricet, Gabepentin and Propanolol with minimal relief. She reports a history of migraines since a young age and this feels very similar. This is not the worst headache she has ever had.  Review of Systems      Past Medical History:  Diagnosis Date  . Allergy   . Anxiety   . Frequent headaches   . Hypothyroidism     Current Outpatient Medications  Medication Sig Dispense Refill  . ALPRAZolam (XANAX) 0.25 MG tablet TAKE 1 TABLET (0.25 MG TOTAL) BY MOUTH 2 (TWO) TIMES DAILY AS NEEDED. 20 tablet 0  . AMETHIA 0.15-0.03 &0.01 MG tablet Take 1 tablet by mouth daily. 1 Package 0  . benzonatate (TESSALON) 200 MG capsule Take 1 capsule (200 mg total) by mouth 3 (three) times daily as needed. 30 capsule 0  . butalbital-acetaminophen-caffeine (FIORICET) 50-325-40 MG tablet Take 1 tablet by mouth every 6 (six) hours as needed for headache. 20 tablet 2  . cyclobenzaprine (FLEXERIL) 10 MG tablet TAKE 1 TABLET BY MOUTH THREE TIMES A DAY AS NEEDED FOR MUSCLE SPASMS 15 tablet 0  . fluticasone (FLONASE) 50 MCG/ACT nasal spray Place 2 sprays into both nostrils daily. 16 g 6  . gabapentin (NEURONTIN) 100 MG capsule TAKE 1 CAPSULE BY MOUTH THREE TIMES A DAY 90 capsule 1  . HYDROcodone-acetaminophen (NORCO/VICODIN) 5-325 MG tablet Take 1 tablet by mouth every 8 (eight) hours as needed for moderate pain. 15 tablet 0  . levocetirizine (XYZAL) 5  MG tablet TAKE 1 TABLET BY MOUTH EVERY DAY IN THE EVENING 90 tablet 2  . levothyroxine (SYNTHROID) 112 MCG tablet Take 1 tablet (112 mcg total) by mouth daily before breakfast. 30 tablet 0  . metoprolol succinate (TOPROL-XL) 25 MG 24 hr tablet TAKE 1 TABLET BY MOUTH TWICE A DAY 60 tablet 5  . pravastatin (PRAVACHOL) 40 MG tablet Take 1 tablet (40 mg total) by mouth daily. MUST SCHEDULE PHYSICAL EXAM FOR REFILLS 90 tablet 0  . predniSONE (STERAPRED UNI-PAK 21 TAB) 10 MG (21) TBPK tablet Take by mouth daily.    . Prenatal Vit-Fe Fumarate-FA (PRENATAL MULTIVITAMIN) TABS tablet Take 1 tablet by mouth daily at 12 noon.    . propranolol (INDERAL) 20 MG tablet Take 1 tablet (20 mg total) by mouth 3 (three) times daily as needed (As needed for palpitations). 270 tablet 3  . sertraline (ZOLOFT) 100 MG tablet TAKE 1 TABLET (100 MG TOTAL) BY MOUTH DAILY 90 tablet 1  . sulfamethoxazole-trimethoprim (BACTRIM DS) 800-160 MG tablet Take 1 tablet by mouth 2 (two) times daily. 20 tablet 0  . valACYclovir (VALTREX) 500 MG tablet Take 500 mg by mouth daily.  3   No current facility-administered medications for this visit.     Allergies  Allergen Reactions  . Ciprofloxacin     anxiety  . Keflex [Cephalexin] Hives  . Trazodone And Nefazodone Other (See Comments)  nightmares    Family History  Problem Relation Age of Onset  . Cancer Mother   . Depression Mother   . Cancer Father   . Hyperlipidemia Father     Social History   Socioeconomic History  . Marital status: Single    Spouse name: Not on file  . Number of children: Not on file  . Years of education: Not on file  . Highest education level: Not on file  Occupational History  . Not on file  Social Needs  . Financial resource strain: Not on file  . Food insecurity    Worry: Not on file    Inability: Not on file  . Transportation needs    Medical: Not on file    Non-medical: Not on file  Tobacco Use  . Smoking status: Never Smoker  .  Smokeless tobacco: Never Used  Substance and Sexual Activity  . Alcohol use: Yes    Comment: occasional  . Drug use: No  . Sexual activity: Yes    Partners: Male  Lifestyle  . Physical activity    Days per week: Not on file    Minutes per session: Not on file  . Stress: Not on file  Relationships  . Social Herbalist on phone: Not on file    Gets together: Not on file    Attends religious service: Not on file    Active member of club or organization: Not on file    Attends meetings of clubs or organizations: Not on file    Relationship status: Not on file  . Intimate partner violence    Fear of current or ex partner: Not on file    Emotionally abused: Not on file    Physically abused: Not on file    Forced sexual activity: Not on file  Other Topics Concern  . Not on file  Social History Narrative  . Not on file     Constitutional: Pt reports headache. Denies fever, malaise, fatigue, or abrupt weight changes.  Respiratory: Denies difficulty breathing, shortness of breath, cough or sputum production.   Cardiovascular: Denies chest pain, chest tightness, palpitations or swelling in the hands or feet.  Neuro: Denies dizziness, difficulty with speech, balance or coordination.   No other specific complaints in a complete review of systems (except as listed in HPI above).  Objective:   Physical Exam  BP 130/84   Pulse 77   Temp 98.1 F (36.7 C) (Temporal)   Wt 223 lb (101.2 kg)   SpO2 98%   BMI 37.11 kg/m   Wt Readings from Last 3 Encounters:  05/08/19 220 lb (99.8 kg)  03/27/19 215 lb (97.5 kg)  12/08/18 192 lb (87.1 kg)    General: Appears in moderate distress, appears her stated age, obese. HEENT: Head: normal shape and size; Eyes: Right eye horizontal nystagmus, sclera white, no icterus, conjunctiva pink and EOMs intact; Cardiovascular: Normal rate and rhythm. S1,S2 noted.  No murmur, rubs or gallops noted. No JVD or BLE edema.  Pulmonary/Chest:  Normal effort and positive vesicular breath sounds. No respiratory distress. No wheezes, rales or ronchi noted.     BMET    Component Value Date/Time   NA 135 12/08/2018 1532   K 4.2 12/08/2018 1532   CL 99 12/08/2018 1532   CO2 28 12/08/2018 1532   GLUCOSE 75 12/08/2018 1532   BUN 8 12/08/2018 1532   CREATININE 1.04 12/08/2018 1532   CALCIUM 8.8 12/08/2018 1532  Lipid Panel     Component Value Date/Time   CHOL 226 (H) 12/08/2018 1532   TRIG 110.0 12/08/2018 1532   HDL 41.00 12/08/2018 1532   CHOLHDL 6 12/08/2018 1532   VLDL 22.0 12/08/2018 1532   LDLCALC 163 (H) 12/08/2018 1532    CBC    Component Value Date/Time   WBC 4.8 06/03/2018 1424   RBC 4.34 06/03/2018 1424   HGB 11.6 (L) 06/03/2018 1424   HCT 34.8 (L) 06/03/2018 1424   PLT 324.0 06/03/2018 1424   MCV 80.3 06/03/2018 1424   MCHC 33.3 06/03/2018 1424   RDW 15.8 (H) 06/03/2018 1424    Hgb A1C Lab Results  Component Value Date   HGBA1C 5.4 04/22/2017           Assessment & Plan:  Migraine without Aura, Not intractable with Status Migranousus:  Toradol 60 mg IM today Phenergan 12.5 mg IM today Continue Gabapentin, Fioricet and Propanolol Will monitor  Webb Silversmith, NP This visit occurred during the SARS-CoV-2 public health emergency.  Safety protocols were in place, including screening questions prior to the visit, additional usage of staff PPE, and extensive cleaning of exam room while observing appropriate contact time as indicated for disinfecting solutions.

## 2019-08-08 NOTE — Telephone Encounter (Signed)
Patient stated that nothing is helping her migraine at this point. She would like to know if she is able to come in today to get an injection  Is this okay to schedule?

## 2019-08-09 MED ORDER — PROMETHAZINE HCL 25 MG/ML IJ SOLN
12.5000 mg | Freq: Once | INTRAMUSCULAR | Status: AC
  Administered 2019-08-08: 12.5 mg via INTRAMUSCULAR

## 2019-08-09 MED ORDER — KETOROLAC TROMETHAMINE 60 MG/2ML IM SOLN
60.0000 mg | Freq: Once | INTRAMUSCULAR | Status: AC
  Administered 2019-08-08: 60 mg via INTRAMUSCULAR

## 2019-08-09 NOTE — Addendum Note (Signed)
Addended by: Lurlean Nanny on: 08/09/2019 05:34 PM   Modules accepted: Orders

## 2019-08-10 ENCOUNTER — Ambulatory Visit: Payer: BC Managed Care – PPO | Admitting: Family Medicine

## 2019-08-14 ENCOUNTER — Encounter: Payer: Self-pay | Admitting: Internal Medicine

## 2019-08-15 MED ORDER — SUMATRIPTAN SUCCINATE 25 MG PO TABS: 25.0000 mg | ORAL_TABLET | ORAL | 0 refills | Status: DC | PRN

## 2019-08-17 ENCOUNTER — Encounter: Payer: Self-pay | Admitting: Internal Medicine

## 2019-08-17 ENCOUNTER — Ambulatory Visit (INDEPENDENT_AMBULATORY_CARE_PROVIDER_SITE_OTHER): Payer: BC Managed Care – PPO | Admitting: Internal Medicine

## 2019-08-17 ENCOUNTER — Other Ambulatory Visit: Payer: Self-pay

## 2019-08-17 VITALS — BP 126/80 | HR 68 | Temp 98.0°F | Ht 65.0 in | Wt 221.0 lb

## 2019-08-17 DIAGNOSIS — N644 Mastodynia: Secondary | ICD-10-CM

## 2019-08-17 DIAGNOSIS — F411 Generalized anxiety disorder: Secondary | ICD-10-CM

## 2019-08-17 DIAGNOSIS — R002 Palpitations: Secondary | ICD-10-CM

## 2019-08-17 DIAGNOSIS — Z79899 Other long term (current) drug therapy: Secondary | ICD-10-CM

## 2019-08-17 DIAGNOSIS — B009 Herpesviral infection, unspecified: Secondary | ICD-10-CM

## 2019-08-17 DIAGNOSIS — E039 Hypothyroidism, unspecified: Secondary | ICD-10-CM

## 2019-08-17 DIAGNOSIS — Z0000 Encounter for general adult medical examination without abnormal findings: Secondary | ICD-10-CM

## 2019-08-17 DIAGNOSIS — E7800 Pure hypercholesterolemia, unspecified: Secondary | ICD-10-CM

## 2019-08-17 DIAGNOSIS — R519 Headache, unspecified: Secondary | ICD-10-CM

## 2019-08-17 DIAGNOSIS — G8929 Other chronic pain: Secondary | ICD-10-CM

## 2019-08-17 NOTE — Progress Notes (Signed)
Subjective:    Patient ID: [name], female    DOB: November 23, 1970, 48 y.o.   MRN: [id number]  HPI  Pt presents to the clinic today for her annual exam. She is also due to follow up chronic conditions.  Frequent Headaches: These occur 7-8 in the last 2 weeks. She takes Metoprolol, Fioricet, Flexeril and was recently started on Imitrex but reports she has not had to take this yet. She is not following with neurology.  Palpitations: Managed on Propanolol. She has seen Dr. Rockey Situ in the past for this.  Hypothyroidism: She denies any issues on her current dose Levothyroxine.   Anxiety: Managed on Sertaline and Xanax. She takes the Xanax a few times per month. She is not currently seeing a therapist. She denies depression, SI/HI. There is no CSA or UDS on file.  HLD: Her last LDL was 163, 12/2018. She denies myalgias on Pravastatin. She ties to consume a low fat diet.  Intermittent Left Breast Pain: s/p burn. She takes Gabapentin as prescribed.  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 She has not had to take Valtrex recently.  Flu: never Tetanus: 07/2016 Mammogram: 02/2017 Pap Smear: 02/2017 Colon Screening: never Vision Screening: as needed Dentist: bianually  Diet: She does eat meats. She consume some veggiew, no fruits. She occassionally eats fried foods. She drinks mostly water and sweet tea. Exercise: Walking  Review of Systems      Past Medical History:  Diagnosis Date  . Allergy   . Anxiety   . Frequent headaches   . Hypothyroidism     Current Outpatient Medications  Medication Sig Dispense Refill  . ALPRAZolam (XANAX) 0.25 MG tablet TAKE 1 TABLET (0.25 MG TOTAL) BY MOUTH 2 (TWO) TIMES DAILY AS NEEDED. 20 tablet 0  . AMETHIA 0.15-0.03 &0.01 MG tablet Take 1 tablet by mouth daily. 1 Package 0  . butalbital-acetaminophen-caffeine (FIORICET) 50-325-40 MG tablet Take 1 tablet by mouth every 6 (six) hours as needed for headache. 20 tablet 2  . cyclobenzaprine (FLEXERIL)  10 MG tablet TAKE 1 TABLET BY MOUTH THREE TIMES A DAY AS NEEDED FOR MUSCLE SPASMS 15 tablet 0  . fluticasone (FLONASE) 50 MCG/ACT nasal spray Place 2 sprays into both nostrils daily. 16 g 6  . gabapentin (NEURONTIN) 100 MG capsule TAKE 1 CAPSULE BY MOUTH THREE TIMES A DAY 90 capsule 1  . HYDROcodone-acetaminophen (NORCO/VICODIN) 5-325 MG tablet Take 1 tablet by mouth every 8 (eight) hours as needed for moderate pain. 15 tablet 0  . levocetirizine (XYZAL) 5 MG tablet TAKE 1 TABLET BY MOUTH EVERY DAY IN THE EVENING 90 tablet 2  . levothyroxine (SYNTHROID) 112 MCG tablet Take 1 tablet (112 mcg total) by mouth daily before breakfast. 30 tablet 0  . metoprolol succinate (TOPROL-XL) 25 MG 24 hr tablet TAKE 1 TABLET BY MOUTH TWICE A DAY 60 tablet 5  . pravastatin (PRAVACHOL) 40 MG tablet Take 1 tablet (40 mg total) by mouth daily. MUST SCHEDULE PHYSICAL EXAM FOR REFILLS 90 tablet 0  . predniSONE (STERAPRED UNI-PAK 21 TAB) 10 MG (21) TBPK tablet Take by mouth daily.    . Prenatal Vit-Fe Fumarate-FA (PRENATAL MULTIVITAMIN) TABS tablet Take 1 tablet by mouth daily at 12 noon.    . propranolol (INDERAL) 20 MG tablet Take 1 tablet (20 mg total) by mouth 3 (three) times daily as needed (As needed for palpitations). 270 tablet 3  . sertraline (ZOLOFT) 100 MG tablet TAKE 1 TABLET (100 MG TOTAL) BY MOUTH DAILY 90 tablet 1  .  SUMAtriptan (IMITREX) 25 MG tablet Take 1 tablet (25 mg total) by mouth every 2 (two) hours as needed for migraine. May repeat in 2 hours if headache persists or recurs. 10 tablet 0  . valACYclovir (VALTREX) 500 MG tablet Take 500 mg by mouth daily.  3   No current facility-administered medications for this visit.    Allergies  Allergen Reactions  . Ciprofloxacin     anxiety  . Keflex [Cephalexin] Hives  . Trazodone And Nefazodone Other (See Comments)    nightmares    Family History  Problem Relation Age of Onset  . Cancer Mother   . Depression Mother   . Cancer Father   .  Hyperlipidemia Father     Social History   Socioeconomic History  . Marital status: Single    Spouse name: Not on file  . Number of children: Not on file  . Years of education: Not on file  . Highest education level: Not on file  Occupational History  . Not on file  Tobacco Use  . Smoking status: Never Smoker  . Smokeless tobacco: Never Used  Substance and Sexual Activity  . Alcohol use: Yes    Comment: occasional  . Drug use: No  . Sexual activity: Yes    Partners: Male  Other Topics Concern  . Not on file  Social History Narrative  . Not on file   Social Determinants of Health   Financial Resource Strain:   . Difficulty of Paying Living Expenses: Not on file  Food Insecurity:   . Worried About Charity fundraiser in the Last Year: Not on file  . Ran Out of Food in the Last Year: Not on file  Transportation Needs:   . Lack of Transportation (Medical): Not on file  . Lack of Transportation (Non-Medical): Not on file  Physical Activity:   . Days of Exercise per Week: Not on file  . Minutes of Exercise per Session: Not on file  Stress:   . Feeling of Stress : Not on file  Social Connections:   . Frequency of Communication with Friends and Family: Not on file  . Frequency of Social Gatherings with Friends and Family: Not on file  . Attends Religious Services: Not on file  . Active Member of Clubs or Organizations: Not on file  . Attends Archivist Meetings: Not on file  . Marital Status: Not on file  Intimate Partner Violence:   . Fear of Current or Ex-Partner: Not on file  . Emotionally Abused: Not on file  . Physically Abused: Not on file  . Sexually Abused: Not on file     Constitutional: Pt reports headaches. Denies fever, malaise, fatigue, or abrupt weight changes.  HEENT: Denies eye pain, eye redness, ear pain, ringing in the ears, wax buildup, runny nose, nasal congestion, bloody nose, or sore throat. Respiratory: Denies difficulty breathing,  shortness of breath, cough or sputum production.   Cardiovascular: Pt reports palpitations. Denies chest pain, chest tightness, or swelling in the hands or feet.  Gastrointestinal: Denies abdominal pain, bloating, constipation, diarrhea or blood in the stool.  GU: Denies urgency, frequency, pain with urination, burning sensation, blood in urine, odor or discharge. Musculoskeletal: Denies decrease in range of motion, difficulty with gait, muscle pain or joint pain and swelling.  Skin: Pt reports intermittent left breast pain. Denies redness, rashes, lesions or ulcercations.  Neurological: Denies dizziness, difficulty with memory, difficulty with speech or problems with balance and coordination.  Psych:  Pt has a history of anxiety. Denies depression, SI/HI.  No other specific complaints in a complete review of systems (except as listed in HPI above).  Objective:   Physical Exam    BP 126/80   Pulse 68   Temp 98 F (36.7 C) (Temporal)   Ht 5\' 5"  (1.651 m)   Wt 221 lb (100.2 kg)   SpO2 98%   BMI 36.78 kg/m   Wt Readings from Last 3 Encounters:  08/08/19 223 lb (101.2 kg)  05/08/19 220 lb (99.8 kg)  03/27/19 215 lb (97.5 kg)    General: Appears her stated age, obese, in NAD. Skin: Warm, dry and intact. HEENT: Head: normal shape and size; Eyes: sclera white, no icterus, conjunctiva pink, PERRLA and EOMs intact;  Neck:  Neck supple, trachea midline. No masses, lumps present.  Cardiovascular: Normal rate and rhythm. S1,S2 noted.  No murmur, rubs or gallops noted. No JVD or BLE edema.  Pulmonary/Chest: Normal effort and positive vesicular breath sounds. No respiratory distress. No wheezes, rales or ronchi noted.  Abdomen: Soft and nontender. Normal bowel sounds. No distention or masses noted. Liver, spleen and kidneys non palpable. Musculoskeletal: Strength 5/5 BUE/BLE. No difficulty with gait.  Neurological: Alert and oriented. Cranial nerves II-XII grossly intact. Coordination  normal.  Psychiatric: Mood and affect normal. Behavior is normal. Judgment and thought content normal.     BMET    Component Value Date/Time   NA 135 12/08/2018 1532   K 4.2 12/08/2018 1532   CL 99 12/08/2018 1532   CO2 28 12/08/2018 1532   GLUCOSE 75 12/08/2018 1532   BUN 8 12/08/2018 1532   CREATININE 1.04 12/08/2018 1532   CALCIUM 8.8 12/08/2018 1532    Lipid Panel     Component Value Date/Time   CHOL 226 (H) 12/08/2018 1532   TRIG 110.0 12/08/2018 1532   HDL 41.00 12/08/2018 1532   CHOLHDL 6 12/08/2018 1532   VLDL 22.0 12/08/2018 1532   LDLCALC 163 (H) 12/08/2018 1532    CBC    Component Value Date/Time   WBC 4.8 06/03/2018 1424   RBC 4.34 06/03/2018 1424   HGB 11.6 (L) 06/03/2018 1424   HCT 34.8 (L) 06/03/2018 1424   PLT 324.0 06/03/2018 1424   MCV 80.3 06/03/2018 1424   MCHC 33.3 06/03/2018 1424   RDW 15.8 (H) 06/03/2018 1424    Hgb A1C Lab Results  Component Value Date   HGBA1C 5.4 04/22/2017          Assessment & Plan:   Preventative Health Maintenance:  She declines flu shot today Tetanus UTD Pap smear UTD She will schedule her mammogram at [facility] Pap smear UTD She declines referral to GI for colon cancer screening Encouraged her to consume a balanced diet and exercise regimen Advised her to see an eye doctor and dentist annually Will check CBC, CMET, TSH, Free T4, Lipid, A1C and Vit D today  RTC in 1 year, sooner if needed   Webb Silversmith, NP This visit occurred during the SARS-CoV-2 public health emergency.  Safety protocols were in place, including screening questions prior to the visit, additional usage of staff PPE, and extensive cleaning of exam room while observing appropriate contact time as indicated for disinfecting solutions.

## 2019-08-17 NOTE — Patient Instructions (Signed)
Health Maintenance, Female Adopting a healthy lifestyle and getting preventive care are important in promoting health and wellness. Ask your health care provider about:  The right schedule for you to have regular tests and exams.  Things you can do on your own to prevent diseases and keep yourself healthy. What should I know about diet, weight, and exercise? Eat a healthy diet   Eat a diet that includes plenty of vegetables, fruits, low-fat dairy products, and lean protein.  Do not eat a lot of foods that are high in solid fats, added sugars, or sodium. Maintain a healthy weight Body mass index (BMI) is used to identify weight problems. It estimates body fat based on height and weight. Your health care provider can help determine your BMI and help you achieve or maintain a healthy weight. Get regular exercise Get regular exercise. This is one of the most important things you can do for your health. Most adults should:  Exercise for at least 150 minutes each week. The exercise should increase your heart rate and make you sweat (moderate-intensity exercise).  Do strengthening exercises at least twice a week. This is in addition to the moderate-intensity exercise.  Spend less time sitting. Even light physical activity can be beneficial. Watch cholesterol and blood lipids Have your blood tested for lipids and cholesterol at 47 years of age, then have this test every 5 years. Have your cholesterol levels checked more often if:  Your lipid or cholesterol levels are high.  You are older than 47 years of age.  You are at high risk for heart disease. What should I know about cancer screening? Depending on your health history and family history, you may need to have cancer screening at various ages. This may include screening for:  Breast cancer.  Cervical cancer.  Colorectal cancer.  Skin cancer.  Lung cancer. What should I know about heart disease, diabetes, and high blood  pressure? Blood pressure and heart disease  High blood pressure causes heart disease and increases the risk of stroke. This is more likely to develop in people who have high blood pressure readings, are of African descent, or are overweight.  Have your blood pressure checked: ? Every 3-5 years if you are 18-39 years of age. ? Every year if you are 40 years old or older. Diabetes Have regular diabetes screenings. This checks your fasting blood sugar level. Have the screening done:  Once every three years after age 40 if you are at a normal weight and have a low risk for diabetes.  More often and at a younger age if you are overweight or have a high risk for diabetes. What should I know about preventing infection? Hepatitis B If you have a higher risk for hepatitis B, you should be screened for this virus. Talk with your health care provider to find out if you are at risk for hepatitis B infection. Hepatitis C Testing is recommended for:  Everyone born from 1945 through 1965.  Anyone with known risk factors for hepatitis C. Sexually transmitted infections (STIs)  Get screened for STIs, including gonorrhea and chlamydia, if: ? You are sexually active and are younger than 47 years of age. ? You are older than 47 years of age and your health care provider tells you that you are at risk for this type of infection. ? Your sexual activity has changed since you were last screened, and you are at increased risk for chlamydia or gonorrhea. Ask your health care provider if   you are at risk.  Ask your health care provider about whether you are at high risk for HIV. Your health care provider may recommend a prescription medicine to help prevent HIV infection. If you choose to take medicine to prevent HIV, you should first get tested for HIV. You should then be tested every 3 months for as long as you are taking the medicine. Pregnancy  If you are about to stop having your period (premenopausal) and  you may become pregnant, seek counseling before you get pregnant.  Take 400 to 800 micrograms (mcg) of folic acid every day if you become pregnant.  Ask for birth control (contraception) if you want to prevent pregnancy. Osteoporosis and menopause Osteoporosis is a disease in which the bones lose minerals and strength with aging. This can result in bone fractures. If you are 65 years old or older, or if you are at risk for osteoporosis and fractures, ask your health care provider if you should:  Be screened for bone loss.  Take a calcium or vitamin D supplement to lower your risk of fractures.  Be given hormone replacement therapy (HRT) to treat symptoms of menopause. Follow these instructions at home: Lifestyle  Do not use any products that contain nicotine or tobacco, such as cigarettes, e-cigarettes, and chewing tobacco. If you need help quitting, ask your health care provider.  Do not use street drugs.  Do not share needles.  Ask your health care provider for help if you need support or information about quitting drugs. Alcohol use  Do not drink alcohol if: ? Your health care provider tells you not to drink. ? You are pregnant, may be pregnant, or are planning to become pregnant.  If you drink alcohol: ? Limit how much you use to 0-1 drink a day. ? Limit intake if you are breastfeeding.  Be aware of how much alcohol is in your drink. In the [place] one drink equals one 12 oz bottle of beer (355 mL), one 5 oz glass of wine (148 mL), or one 1 oz glass of hard liquor (44 mL). General instructions  Schedule regular health, dental, and eye exams.  Stay current with your vaccines.  Tell your health care provider if: ? You often feel depressed. ? You have ever been abused or do not feel safe at home. Summary  Adopting a healthy lifestyle and getting preventive care are important in promoting health and wellness.  Follow your health care provider's instructions about healthy  diet, exercising, and getting tested or screened for diseases.  Follow your health care provider's instructions on monitoring your cholesterol and blood pressure. This information is not intended to replace advice given to you by your health care provider. Make sure you discuss any questions you have with your health care provider. Document Released: 03/09/2011 Document Revised: 08/17/2018 Document Reviewed: 08/17/2018 Elsevier Patient Education  2020 [employer]vier Inc.  

## 2019-08-18 ENCOUNTER — Encounter: Payer: Self-pay | Admitting: Internal Medicine

## 2019-08-19 ENCOUNTER — Encounter: Payer: Self-pay | Admitting: Internal Medicine

## 2019-08-19 DIAGNOSIS — N644 Mastodynia: Secondary | ICD-10-CM | POA: Insufficient documentation

## 2019-08-19 DIAGNOSIS — B009 Herpesviral infection, unspecified: Secondary | ICD-10-CM | POA: Insufficient documentation

## 2019-08-19 DIAGNOSIS — R002 Palpitations: Secondary | ICD-10-CM | POA: Insufficient documentation

## 2019-08-19 DIAGNOSIS — E785 Hyperlipidemia, unspecified: Secondary | ICD-10-CM | POA: Insufficient documentation

## 2019-08-19 NOTE — Assessment & Plan Note (Signed)
Continue Metoprolol, Fioricet, Flexeril and Imitrex Will monitor

## 2019-08-19 NOTE — Assessment & Plan Note (Signed)
Continue Valtrex prn

## 2019-08-19 NOTE — Assessment & Plan Note (Signed)
Continue Gabapentin

## 2019-08-19 NOTE — Assessment & Plan Note (Signed)
Continue Propanolol as prescribed by cardiology

## 2019-08-19 NOTE — Assessment & Plan Note (Signed)
TSH and Free T4 today Continue Levothyroxine, will adjust if needed based on labs 

## 2019-08-19 NOTE — Assessment & Plan Note (Signed)
CMET and Lipid profile today Continue Pravastatin for now

## 2019-08-19 NOTE — Assessment & Plan Note (Signed)
Continue Sertraline and Xaanx CSA and UDS today

## 2019-08-20 ENCOUNTER — Encounter: Payer: Self-pay | Admitting: Internal Medicine

## 2019-08-20 LAB — PAIN MGMT, PROFILE 8 W/CONF, U
6 Acetylmorphine: NEGATIVE ng/mL
Alcohol Metabolites: NEGATIVE ng/mL (ref <500)
Alphahydroxyalprazolam: NEGATIVE ng/mL
Alphahydroxymidazolam: NEGATIVE ng/mL
Alphahydroxytriazolam: NEGATIVE ng/mL
Aminoclonazepam: NEGATIVE ng/mL
Amphetamines: NEGATIVE ng/mL
Benzodiazepines: NEGATIVE ng/mL
Buprenorphine, Urine: NEGATIVE ng/mL
Buprenorphine: NEGATIVE ng/mL
Cocaine Metabolite: NEGATIVE ng/mL
Creatinine: 204.1 mg/dL
Hydroxyethylflurazepam: NEGATIVE ng/mL
Lorazepam: NEGATIVE ng/mL
MDMA: NEGATIVE ng/mL
Marijuana Metabolite: NEGATIVE ng/mL
Norbuprenorphine: NEGATIVE ng/mL
Nordiazepam: NEGATIVE ng/mL
Opiates: NEGATIVE ng/mL
Oxazepam: NEGATIVE ng/mL
Oxidant: NEGATIVE ug/mL
Oxycodone: NEGATIVE ng/mL
Temazepam: NEGATIVE ng/mL
pH: 6 (ref 4.5–9.0)

## 2019-08-23 ENCOUNTER — Other Ambulatory Visit: Payer: Self-pay | Admitting: Internal Medicine

## 2019-08-24 ENCOUNTER — Other Ambulatory Visit: Payer: Self-pay | Admitting: Internal Medicine

## 2019-09-08 NOTE — Telephone Encounter (Signed)
The ectopy will not hurt her, a benign finding We should not have to escalate the medications typically Frequently when she hit her button on the zio monitor it was not associated with these ectopic beats which makes me feel that she may not be appreciating most of them  With metoprolol twice a day , that should suppress much of the arrhythmia She can meet with an electrophysiologist if she would like to escalate her medications

## 2019-09-10 ENCOUNTER — Other Ambulatory Visit: Payer: Self-pay | Admitting: Internal Medicine

## 2019-09-14 ENCOUNTER — Encounter: Payer: Self-pay | Admitting: Internal Medicine

## 2019-09-14 ENCOUNTER — Other Ambulatory Visit: Payer: Self-pay | Admitting: *Deleted

## 2019-09-14 DIAGNOSIS — I491 Atrial premature depolarization: Secondary | ICD-10-CM

## 2019-09-15 MED ORDER — RIZATRIPTAN BENZOATE 10 MG PO TBDP: 10.0000 mg | ORAL_TABLET | ORAL | 0 refills | Status: DC | PRN

## 2019-09-15 NOTE — Progress Notes (Signed)
LVM to schedule new appt

## 2019-09-15 NOTE — Addendum Note (Signed)
Addended by: Lurlean Nanny on: 09/15/2019 05:10 PM   Modules accepted: Orders

## 2019-10-09 ENCOUNTER — Other Ambulatory Visit: Payer: Self-pay | Admitting: Internal Medicine

## 2019-10-10 NOTE — Telephone Encounter (Signed)
Last filled 09/15/2019 #10... please advise

## 2019-10-22 ENCOUNTER — Other Ambulatory Visit: Payer: Self-pay | Admitting: Internal Medicine

## 2019-11-06 ENCOUNTER — Other Ambulatory Visit: Payer: Self-pay | Admitting: Internal Medicine

## 2019-11-09 ENCOUNTER — Encounter: Payer: Self-pay | Admitting: Internal Medicine

## 2019-11-09 ENCOUNTER — Other Ambulatory Visit: Payer: Self-pay

## 2019-11-09 ENCOUNTER — Ambulatory Visit: Payer: BC Managed Care – PPO | Admitting: Internal Medicine

## 2019-11-09 VITALS — BP 123/90 | HR 67 | Ht 65.0 in | Wt 226.5 lb

## 2019-11-09 DIAGNOSIS — I472 Ventricular tachycardia, unspecified: Secondary | ICD-10-CM

## 2019-11-09 DIAGNOSIS — I491 Atrial premature depolarization: Secondary | ICD-10-CM | POA: Diagnosis not present

## 2019-11-09 DIAGNOSIS — R002 Palpitations: Secondary | ICD-10-CM

## 2019-11-09 MED ORDER — VERAPAMIL HCL ER 120 MG PO TBCR: 120.0000 mg | EXTENDED_RELEASE_TABLET | Freq: Every day | ORAL | 0 refills | Status: DC

## 2019-11-09 MED ORDER — DILTIAZEM HCL ER COATED BEADS 120 MG PO CP24: 120.0000 mg | ORAL_CAPSULE | Freq: Every day | ORAL | 0 refills | Status: DC

## 2019-11-09 NOTE — Patient Instructions (Signed)
Medication Instructions:  Your physician has recommended you make the following change in your medication:  1) You have been given two different calcium channel blocker medications for you to try. You can take one at a time. Try to give each one a two week try-out. These two medications are listed below: Verapamil 120 mg take one tablet by mouth at bedtime daily Diltiazem  102 mg take one tablet by mouth at bedtime daily.   2) STOP taking Propranolol 20 mg now. 3) In 5 days decrease metoprolol succinate to taking one 25 mg pill once daily x5 days. After these 5 days stop this medication. *If you need a refill on your cardiac medications before your next appointment, please call your pharmacy*   Lab Work: None If you have labs (blood work) drawn today and your tests are completely normal, you will receive your results only by: [name] MyChart Message (if you have MyChart) OR . A paper copy in the mail If you have any lab test that is abnormal or we need to change your treatment, we will call you to review the results.   Testing/Procedures: You are scheduled for Cardiac MRI on ______. Please arrive at the [facility] main entrance of [facility] at ____ (30-45 minutes prior to test start time). ?  [facility]  [address]  [place], [place] [address]  [phone]  Proceed to the [facility] Radiology Department (First Floor).  ?  Magnetic resonance imaging (MRI) is a painless test that produces images of the inside of the body without using X-rays. During an MRI, strong magnets and radio waves work together in a Research officer, political party to form detailed images. MRI images may provide more details about a medical condition than X-rays, CT scans, and ultrasounds can provide.  You may be given earphones to listen for instructions.  You may eat a light breakfast and take medications as ordered. If a contrast material will be used, an IV will be inserted into one of your veins.  Contrast material will be injected into your IV.  You will be asked to remove all metal, including: Watch, jewelry, and other metal objects including hearing aids, hair pieces and dentures. (Braces and fillings normally are not a problem.)  If contrast material was used:  It will leave your body through your urine within a day. You may be told to drink plenty of fluids to help flush the contrast material out of your system.  TEST WILL TAKE APPROXIMATELY 1 HOUR  PLEASE NOTIFY SCHEDULING AT LEAST 24 HOURS IN ADVANCE IF YOU ARE UNABLE TO KEEP YOUR APPOINTMENT.    Your physician is recommending that you have a signal average EKG. We will try to cordinate this test with your MRI.   Follow-Up: At [facility], you and your health needs are our priority.  As part of our continuing mission to provide you with exceptional heart care, we have created designated Provider Care Teams.  These Care Teams include your primary Cardiologist (physician) and Advanced Practice Providers (APPs -  Physician Assistants and Nurse Practitioners) who all work together to provide you with the care you need, when you need it.  We recommend signing up for the patient portal called "MyChart".  Sign up information is provided on this After Visit Summary.  MyChart is used to connect with patients for Virtual Visits (Telemedicine).  Patients are able to view lab/test results, encounter notes, upcoming appointments, etc.  Non-urgent messages can be sent to  your provider as well.   To learn more about what you can do with MyChart, go to [email or web address].    Your next appointment:   6 week(s)  The format for your next appointment:   In Person or Virtual  Provider:   Virl Axe, MD

## 2019-11-09 NOTE — Progress Notes (Signed)
ELECTROPHYSIOLOGY CONSULT NOTE  Patient ID: [name], MRN: [id number], DOB/AGE: 05/15/1971 49 y.o. Admit date: (Not on file) Date of Consult: 11/09/2019  Primary Physician: Jearld Fenton, NP Primary Cardiologist: Mendel Corning [name] is a 49 y.o. female who is being seen today for the evaluation of palpitations at the request of TG.    HPI [name] is a 49 y.o. female  Referred for frequent palpitations which in the past have been shown to be 2/2 PACs mostly  They are most notable in the evening, but can come on anytime frequently in flurries and then assoc with SOB and LH;  No chest pain or syncope  Does not note an association with anything, caffeine stress or exercise.   DATE TEST EF   10/20 Echo   60-65 %          Event Recorder personnally reviewed frequent PACs at about 2%. Triggered events were high-density PACs. There were two episodes of nonsustained supraventricular tachycardia which were unassociated with symptoms albeit one of them occurred at 11 at night. She also had one episode of nonsustained ventricular tachycardia.  No family hx of cardiomyopathy  For her symptomatic PACs in the past she was started on beta-blockers. Started on metoprolol and then propranolol was added. With the metoprolol, significant fatigue and lethargy. Unfortunately accompanied by a 20-30 pound weight gain  She is seeking alternative treatments   Past Medical History:  Diagnosis Date  . Allergy   . Anxiety   . Frequent headaches   . Hypothyroidism       Surgical History:  Past Surgical History:  Procedure Laterality Date  . APPENDECTOMY  2013  . lapband  2013  . TONSILLECTOMY  2012     Home Meds: Current Meds  Medication Sig  . ALPRAZolam (XANAX) 0.25 MG tablet TAKE 1 TABLET (0.25 MG TOTAL) BY MOUTH 2 (TWO) TIMES DAILY AS NEEDED.  [name] AMETHIA 0.15-0.03 &0.01 MG tablet Take 1 tablet by mouth daily.  . butalbital-acetaminophen-caffeine (FIORICET)  50-325-40 MG tablet Take 1 tablet by mouth every 6 (six) hours as needed for headache.  . cyclobenzaprine (FLEXERIL) 10 MG tablet TAKE 1 TABLET BY MOUTH THREE TIMES A DAY AS NEEDED FOR MUSCLE SPASMS  . fluticasone (FLONASE) 50 MCG/ACT nasal spray Place 2 sprays into both nostrils daily.  [name] gabapentin (NEURONTIN) 100 MG capsule TAKE 1 CAPSULE BY MOUTH THREE TIMES A DAY  . levocetirizine (XYZAL) 5 MG tablet TAKE 1 TABLET BY MOUTH EVERY DAY IN THE EVENING  . levothyroxine (SYNTHROID) 112 MCG tablet TAKE 1 TABLET (112 MCG TOTAL) BY MOUTH DAILY BEFORE BREAKFAST.  . metoprolol succinate (TOPROL-XL) 25 MG 24 hr tablet TAKE 1 TABLET BY MOUTH TWICE A DAY  . pravastatin (PRAVACHOL) 40 MG tablet Take 1 tablet (40 mg total) by mouth daily.  . Prenatal Vit-Fe Fumarate-FA (PRENATAL MULTIVITAMIN) TABS tablet Take 1 tablet by mouth daily at 12 noon.  . propranolol (INDERAL) 20 MG tablet Take 1 tablet (20 mg total) by mouth 3 (three) times daily as needed (As needed for palpitations).  . rizatriptan (MAXALT-MLT) 10 MG disintegrating tablet TAKE 1 TABLET BY MOUTH AS NEEDED FOR MIGRAINE. MAY REPEAT IN 2 HOURS IF NEEDED  . sertraline (ZOLOFT) 100 MG tablet TAKE 1 TABLET BY MOUTH EVERY DAY  . SUMAtriptan (IMITREX) 25 MG tablet Take 1 tablet (25 mg total) by mouth every 2 (two) hours as needed for migraine. May repeat in 2 hours if headache  persists or recurs.  . valACYclovir (VALTREX) 500 MG tablet Take 500 mg by mouth daily.    Allergies:  Allergies  Allergen Reactions  . Ciprofloxacin     anxiety  . Keflex [Cephalexin] Hives  . Trazodone And Nefazodone Other (See Comments)    nightmares    Social History   Socioeconomic History  . Marital status: Single    Spouse name: Not on file  . Number of children: Not on file  . Years of education: Not on file  . Highest education level: Not on file  Occupational History  . Not on file  Tobacco Use  . Smoking status: Never Smoker  . Smokeless tobacco: Never  Used  Substance and Sexual Activity  . Alcohol use: Yes    Comment: occasional  . Drug use: No  . Sexual activity: Yes    Partners: Male  Other Topics Concern  . Not on file  Social History Narrative  . Not on file   Social Determinants of Health   Financial Resource Strain:   . Difficulty of Paying Living Expenses: Not on file  Food Insecurity:   . Worried About Charity fundraiser in the Last Year: Not on file  . Ran Out of Food in the Last Year: Not on file  Transportation Needs:   . Lack of Transportation (Medical): Not on file  . Lack of Transportation (Non-Medical): Not on file  Physical Activity:   . Days of Exercise per Week: Not on file  . Minutes of Exercise per Session: Not on file  Stress:   . Feeling of Stress : Not on file  Social Connections:   . Frequency of Communication with Friends and Family: Not on file  . Frequency of Social Gatherings with Friends and Family: Not on file  . Attends Religious Services: Not on file  . Active Member of Clubs or Organizations: Not on file  . Attends Archivist Meetings: Not on file  . Marital Status: Not on file  Intimate Partner Violence:   . Fear of Current or Ex-Partner: Not on file  . Emotionally Abused: Not on file  . Physically Abused: Not on file  . Sexually Abused: Not on file     Family History  Problem Relation Age of Onset  . Cancer Mother   . Depression Mother   . Cancer Father   . Hyperlipidemia Father      ROS:  Please see the history of present illness.     All other systems reviewed and negative.    Physical Exam: Blood pressure 123/90, pulse 67, height 5\' 5"  (1.651 m), weight 226 lb 8 oz (102.7 kg), SpO2 98 %. General: Well developed, well nourished female in no acute distress. Head: Normocephalic, atraumatic, sclera non-icteric, no xanthomas, nares are without discharge. EENT: normal  Lymph Nodes:  none Neck: Negative for carotid bruits. JVD not elevated. Back:without scoliosis  kyphosis Lungs: Clear bilaterally to auscultation without wheezes, rales, or rhonchi. Breathing is unlabored. Heart: RRR with S1 S2. No murmur . No rubs, or gallops appreciated. Abdomen: Soft, non-tender, non-distended with normoactive bowel sounds. No hepatomegaly. No rebound/guarding. No obvious abdominal masses. Msk:  Strength and tone appear normal for age. Extremities: No clubbing or cyanosis. No + edema.  Distal pedal pulses are 2+ and equal bilaterally. Skin: Warm and Dry Neuro: Alert and oriented X 3. CN III-XII intact Grossly normal sensory and motor function . Psych:  Responds to questions appropriately with a normal affect.  Labs: Cardiac Enzymes No results for input(s): CKTOTAL, CKMB, TROPONINI in the last 72 hours. CBC Lab Results  Component Value Date   WBC 4.8 06/03/2018   HGB 11.6 (L) 06/03/2018   HCT 34.8 (L) 06/03/2018   MCV 80.3 06/03/2018   PLT 324.0 06/03/2018   PROTIME: No results for input(s): LABPROT, INR in the last 72 hours. Chemistry No results for input(s): NA, K, CL, CO2, BUN, CREATININE, CALCIUM, PROT, BILITOT, ALKPHOS, ALT, AST, GLUCOSE in the last 168 hours.  Invalid input(s): LABALBU Lipids Lab Results  Component Value Date   CHOL 226 (H) 12/08/2018   HDL 41.00 12/08/2018   LDLCALC 163 (H) 12/08/2018   TRIG 110.0 12/08/2018   BNP No results found for: PROBNP Thyroid Function Tests: No results for input(s): TSH, T4TOTAL, T3FREE, THYROIDAB in the last 72 hours.  Invalid input(s): FREET3 Miscellaneous No results found for: DDIMER  Radiology/Studies:  No results found.  EKG: Sinus rhythm with isolated PAC   Assessment and Plan:  PACs-frequent-symptomatic  VT-nonsustained  Blood pressure-elevated  Obesity   The patient has symptomatic PACs that have been persistent despite double beta-blockers. We will stop the beta-blockers, weaning them as outlined and then try her calcium blockers. As most of her symptoms are when she  tries to get quiet at night we will have her take then at bedtime. I have given her prescriptions for both diltiazem and verapamil 120 to take in whichever order she would like.  We discussed the role of antiarrhythmic drugs as a more potent suppressor of ectopy; however, given the potential for some serious risk, we will try the beta-blockers first.  Her PAC frequency is probably on the cusp for consideration of catheter ablation in the event that antiarrhythmic therapy does not work.  Nonsustained ventricular tachycardia in the context of her normal LV function should have little prognostic implication. The other issue is as to why; we will undertake a signal-averaged ECG and a cMRI looking for evidence of underlying structural disease.      Virl Axe

## 2019-11-16 ENCOUNTER — Other Ambulatory Visit: Payer: Self-pay | Admitting: Internal Medicine

## 2019-11-27 ENCOUNTER — Encounter: Payer: Self-pay | Admitting: *Deleted

## 2019-11-27 ENCOUNTER — Telehealth: Payer: Self-pay | Admitting: *Deleted

## 2019-11-27 NOTE — Telephone Encounter (Signed)
Spoke with patient regarding appointment for Cardiac MRI scheduled 01/03/20 at 8:00 am at [facility]--arrival time is 7:15 am 1st floor radiology--will mail information to patient and it is also in My Chart

## 2019-11-29 ENCOUNTER — Other Ambulatory Visit: Payer: Self-pay | Admitting: Internal Medicine

## 2019-11-29 NOTE — Telephone Encounter (Signed)
This is a [place] pt 

## 2019-12-07 ENCOUNTER — Encounter: Payer: Self-pay | Admitting: Internal Medicine

## 2019-12-08 ENCOUNTER — Other Ambulatory Visit: Payer: Self-pay | Admitting: Internal Medicine

## 2019-12-14 ENCOUNTER — Other Ambulatory Visit: Payer: Self-pay | Admitting: Internal Medicine

## 2019-12-21 ENCOUNTER — Encounter: Payer: Self-pay | Admitting: Internal Medicine

## 2019-12-21 ENCOUNTER — Other Ambulatory Visit: Payer: Self-pay

## 2019-12-21 ENCOUNTER — Other Ambulatory Visit (INDEPENDENT_AMBULATORY_CARE_PROVIDER_SITE_OTHER): Payer: BC Managed Care – PPO

## 2019-12-21 ENCOUNTER — Ambulatory Visit (INDEPENDENT_AMBULATORY_CARE_PROVIDER_SITE_OTHER): Payer: BC Managed Care – PPO | Admitting: Internal Medicine

## 2019-12-21 VITALS — BP 162/92 | HR 80 | Ht 65.0 in | Wt 225.4 lb

## 2019-12-21 DIAGNOSIS — E7800 Pure hypercholesterolemia, unspecified: Secondary | ICD-10-CM

## 2019-12-21 DIAGNOSIS — F411 Generalized anxiety disorder: Secondary | ICD-10-CM | POA: Diagnosis not present

## 2019-12-21 DIAGNOSIS — G8929 Other chronic pain: Secondary | ICD-10-CM

## 2019-12-21 DIAGNOSIS — I472 Ventricular tachycardia, unspecified: Secondary | ICD-10-CM

## 2019-12-21 DIAGNOSIS — Z0000 Encounter for general adult medical examination without abnormal findings: Secondary | ICD-10-CM | POA: Diagnosis not present

## 2019-12-21 DIAGNOSIS — I491 Atrial premature depolarization: Secondary | ICD-10-CM

## 2019-12-21 DIAGNOSIS — E039 Hypothyroidism, unspecified: Secondary | ICD-10-CM

## 2019-12-21 DIAGNOSIS — R519 Headache, unspecified: Secondary | ICD-10-CM

## 2019-12-21 LAB — COMPREHENSIVE METABOLIC PANEL
ALT: 5 U/L (ref 0–35)
AST: 11 U/L (ref 0–37)
Albumin: 4 g/dL (ref 3.5–5.2)
Alkaline Phosphatase: 71 U/L (ref 39–117)
BUN: 14 mg/dL (ref 6–23)
CO2: 31 mEq/L (ref 19–32)
Calcium: 9 mg/dL (ref 8.4–10.5)
Chloride: 102 mEq/L (ref 96–112)
Creatinine, Ser: 1.01 mg/dL (ref 0.40–1.20)
GFR: 58.6 mL/min — ABNORMAL LOW (ref >60.00)
Glucose, Bld: 96 mg/dL (ref 70–99)
Potassium: 4.4 mEq/L (ref 3.5–5.1)
Sodium: 139 mEq/L (ref 135–145)
Total Bilirubin: 0.3 mg/dL (ref 0.2–1.2)
Total Protein: 6.7 g/dL (ref 6.0–8.3)

## 2019-12-21 LAB — LIPID PANEL
Cholesterol: 184 mg/dL (ref 0–200)
HDL: 38 mg/dL — ABNORMAL LOW (ref >39.00)
LDL Cholesterol: 127 mg/dL — ABNORMAL HIGH (ref 0–99)
NonHDL: 145.53
Total CHOL/HDL Ratio: 5
Triglycerides: 94 mg/dL (ref 0.0–149.0)
VLDL: 18.8 mg/dL (ref 0.0–40.0)

## 2019-12-21 LAB — T4, FREE: Free T4: 0.83 ng/dL (ref 0.60–1.60)

## 2019-12-21 LAB — VITAMIN D 25 HYDROXY (VIT D DEFICIENCY, FRACTURES): VITD: 39.9 ng/mL (ref 30.00–100.00)

## 2019-12-21 LAB — CBC
HCT: 30.8 % — ABNORMAL LOW (ref 36.0–46.0)
Hemoglobin: 10.2 g/dL — ABNORMAL LOW (ref 12.0–15.0)
MCHC: 32.9 g/dL (ref 30.0–36.0)
MCV: 76.8 fl — ABNORMAL LOW (ref 78.0–100.0)
Platelets: 296 10*3/uL (ref 150.0–400.0)
RBC: 4.01 Mil/uL (ref 3.87–5.11)
RDW: 16.5 % — ABNORMAL HIGH (ref 11.5–15.5)
WBC: 4.1 10*3/uL (ref 4.0–10.5)

## 2019-12-21 LAB — TSH: TSH: 0.73 u[IU]/mL (ref 0.35–4.50)

## 2019-12-21 LAB — HEMOGLOBIN A1C: Hgb A1c MFr Bld: 5.7 % (ref 4.6–6.5)

## 2019-12-21 MED ORDER — FLECAINIDE ACETATE 50 MG PO TABS: 50.0000 mg | ORAL_TABLET | Freq: Two times a day (BID) | ORAL | 6 refills | Status: DC

## 2019-12-21 NOTE — Progress Notes (Signed)
Patient Care Team: Jearld Fenton, NP as PCP - General (Internal Medicine)   HPI  [name] is a 49 y.o. female Seen in follow-up for palpitations attributed to PACs.  She also has nonsustained ventricular tachycardia.  Cardiac evaluation has included an echocardiogram that was normal.  cMRI and signal-averaged ECG are pending.  Not been benefited by beta-blocker; at initial evaluation we introduced verapamil.  Significant improvement in palpitations; however, profound persisting fatigue.  No dyspnea.  DATE TEST EF   10/20 Echo   60-65 %         Date Cr K TSH  4/20 1.04 4.2 5.73 (7/20)  11/20     0.65      Records and Results Reviewed   Past Medical History:  Diagnosis Date  . Allergy   . Anxiety   . Frequent headaches   . Hypothyroidism     Past Surgical History:  Procedure Laterality Date  . APPENDECTOMY  2013  . lapband  2013  . TONSILLECTOMY  2012    Current Meds  Medication Sig  . ALPRAZolam (XANAX) 0.25 MG tablet TAKE 1 TABLET (0.25 MG TOTAL) BY MOUTH 2 (TWO) TIMES DAILY AS NEEDED.  [name] AMETHIA 0.15-0.03 &0.01 MG tablet Take 1 tablet by mouth daily.  . butalbital-acetaminophen-caffeine (FIORICET) 50-325-40 MG tablet Take 1 tablet by mouth every 6 (six) hours as needed for headache.  . cyclobenzaprine (FLEXERIL) 10 MG tablet TAKE 1 TABLET BY MOUTH THREE TIMES A DAY AS NEEDED FOR MUSCLE SPASMS  . fluticasone (FLONASE) 50 MCG/ACT nasal spray Place 2 sprays into both nostrils daily.  [name] gabapentin (NEURONTIN) 100 MG capsule TAKE 1 CAPSULE BY MOUTH THREE TIMES A DAY  . levocetirizine (XYZAL) 5 MG tablet TAKE 1 TABLET BY MOUTH EVERY DAY IN THE EVENING  . levothyroxine (SYNTHROID) 112 MCG tablet TAKE 1 TABLET (112 MCG TOTAL) BY MOUTH DAILY BEFORE BREAKFAST.  [name] pravastatin (PRAVACHOL) 40 MG tablet TAKE 1 TABLET BY MOUTH EVERY DAY  . Prenatal Vit-Fe Fumarate-FA (PRENATAL MULTIVITAMIN) TABS tablet Take 1 tablet by mouth daily at 12 noon.  .  rizatriptan (MAXALT-MLT) 10 MG disintegrating tablet TAKE 1 TABLET BY MOUTH AS NEEDED FOR MIGRAINE. MAY REPEAT IN 2 HOURS IF NEEDED  . sertraline (ZOLOFT) 100 MG tablet TAKE 1 TABLET BY MOUTH EVERY DAY  . SUMAtriptan (IMITREX) 25 MG tablet Take 1 tablet (25 mg total) by mouth every 2 (two) hours as needed for migraine. May repeat in 2 hours if headache persists or recurs.  . valACYclovir (VALTREX) 500 MG tablet Take 500 mg by mouth daily.  . verapamil (VERELAN PM) 120 MG 24 hr capsule TAKE 1 CAPSULE BY MOUTH EVERY DAY AT BEDTIME    Allergies  Allergen Reactions  . Ciprofloxacin     anxiety  . Keflex [Cephalexin] Hives  . Trazodone And Nefazodone Other (See Comments)    nightmares      Review of Systems negative except from HPI and PMH  Physical Exam BP (!) 162/92 (BP Location: Right Arm, Patient Position: Sitting, Cuff Size: Normal)   Pulse 80   Ht 5\' 5"  (1.651 m)   Wt 225 lb 6 oz (102.2 kg)   SpO2 98%   BMI 37.50 kg/m   Well developed and nourished in no acute distress HENT normal Neck supple with JVP-  flat   Clear Regular rate and rhythm, no murmurs or gallops Abd-soft with active BS No Clubbing cyanosis edema Skin-warm and dry A & Oriented  Grossly normal sensory and motor function  ECG sinus sinus 80 13/08/39 O/w normal       CrCl cannot be calculated (Patient's most recent lab result is older than the maximum 21 days allowed.).   Assessment and  Plan  PACs-frequent-symptomatic  VT-nonsustained  Blood pressure-elevated  Hypertension  Obesity  Symptomatic PACs.  Verapamil was helpful but not tolerated secondary to fatigue.  We have decided to try low-dose flecainide.  Reviewed proarrhythmic side effects.  We will undertake treadmill testing.  BP elevated today but mostly well controlled < 130's at home   VT eval pending no need to be or he can do it when I get back she is not that that I do.  I think probably just have her started would do it we  have is her stop with efforts to get it back  With delta TSH have recommended recheck--will go to PCP      Current medicines are reviewed at length with the patient today .  The patient does not  have concerns regarding medicines.

## 2019-12-21 NOTE — Patient Instructions (Signed)
Medication Instructions:  - Your physician has recommended you make the following change in your medication:   1) Start flecainide 50 mg- take 1 tablet by mouth twice daily   *If you need a refill on your cardiac medications before your next appointment, please call your pharmacy*   Lab Work: - none ordered  If you have labs (blood work) drawn today and your tests are completely normal, you will receive your results only by: [name] MyChart Message (if you have MyChart) OR . A paper copy in the mail If you have any lab test that is abnormal or we need to change your treatment, we will call you to review the results.   Testing/Procedures: - Your physician has requested that you have an exercise tolerance test (Tuesday 01/09/20 at 8:00 am).   - you may eat a light breakfast/ lunch prior to your procedure - no caffeine for 24 hours prior to your test (coffee, tea, soft drinks, or chocolate)  - no smoking/ vaping for 4 hours prior to your test - you may take your regular medications the day of your test - bring any inhalers with you to your test - wear comfortable clothing & tennis/ non-skid shoes to walk on the treadmill   Follow-Up: At [facility], you and your health needs are our priority.  As part of our continuing mission to provide you with exceptional heart care, we have created designated Provider Care Teams.  These Care Teams include your primary Cardiologist (physician) and Advanced Practice Providers (APPs -  Physician Assistants and Nurse Practitioners) who all work together to provide you with the care you need, when you need it.  We recommend signing up for the patient portal called "MyChart".  Sign up information is provided on this After Visit Summary.  MyChart is used to connect with patients for Virtual Visits (Telemedicine).  Patients are able to view lab/test results, encounter notes, upcoming appointments, etc.  Non-urgent messages can be sent to your provider as well.     To learn more about what you can do with MyChart, go to [email or web address].    Your next appointment:   6  month(s)  The format for your next appointment:   In Person  Provider:   Virl Axe, MD   Other Instructions N/a   Exercise Stress Test  An exercise stress test is a test that is done to collect information about how your heart functions during exercise. The test is done while you are walking on a treadmill or using an exercise bike. The goal is to raise your heart rate and "stress" the heart. The heart is evaluated before, during, and after you exercise. An electrocardiogram (ECG) will be used to monitor the heart, and your blood pressure will also be monitored. In some cases, nuclear scanning or an ultrasound of the heart (echocardiogram) will also be done to evaluate your heart. An exercise stress test is done to look for coronary artery disease (CAD). The test may also be done to:  Evaluate your limits of exercise during cardiac rehabilitation.  Check for high blood pressure during exercise.  Check how well you can exercise after such treatments as coronary stenting or new medicines.  Check for problems with blood flow to your arms and legs during exercise. If you have an abnormal test result, this may mean that you are not getting enough blood flow to your heart during exercise. More testing may be needed to understand why your test was not normal. Tell  a health care provider about:  Any allergies you have.  All medicines you are taking, including vitamins, herbs, eye drops, creams, and over-the-counter medicines.  Any blood disorders you have.  Any surgeries you have had.  Any medical conditions you have.  Whether you are pregnant or may be pregnant. What are the risks? Generally, this is a safe procedure. However, problems may occur, including:  Pain or pressure in the following areas: ? Chest. ? Jaw or neck. ? Between your shoulder blades. ? Down  your left arm.  Dizziness or lightheadedness.  Shortness of breath.  Increased or irregular heartbeats.  Nausea or vomiting.  Heart attack (rare).  Life-threatening abnormal heart rhythm (rare). What happens before the procedure?  Follow instructions from your health care provider about eating or drinking restrictions. ? You may be told to avoid all forms of caffeine for 24 hours before the test. This includes coffee, tea (even decaffeinated tea), caffeinated sodas, chocolate, cocoa, and certain pain medicines.  Ask your health care provider about: ? Taking over-the-counter medicines, vitamins, herbs, and supplements. ? Changing or stopping your regular medicines. This is especially important if you are taking diabetes medicines or beta-blocker medicines.  If you have diabetes, ask how you are to take your insulin or pills. It is common to adjust your insulin dose the morning of the test.  If you are taking beta-blocker medicines, it is important to talk to your health care provider about these medicines well before the date of your test. Taking beta-blocker medicines may interfere with the test. In some cases, these medicines may need to be changed or stopped 24 hours or more before the test.  If you wear a nitroglycerin patch, it may need to be removed prior to the test. Ask your health care provider if the patch should be removed before the test.  If you use an inhaler for any breathing condition, bring it with you to the test.  Do not apply lotions, powders, creams, or oils on your chest prior to the test.  Wear loose-fitting clothes and comfortable walking shoes.  Do not use any products that contain nicotine or tobacco, such as cigarettes and e-cigarettes, for 4 hours before the test or as told by your health care provider. If you need help quitting, ask your health care provider. What happens during the procedure?  Multiple electrodes will be attached to your  chest.  Multiple wires will be attached to the electrodes. These will transfer the electrical impulses from your heart to the ECG machine. Your heart will be monitored both at rest and while exercising.  If you are also having an echocardiogram or nuclear scanning, images of your heart will be taken before and after you exercise.  A blood pressure cuff will be placed around your arm to measure your blood pressure throughout the test. You will feel it tighten and loosen throughout the test.  You will walk on a treadmill or use a stationary bike. If you cannot use these, you may be asked to turn a crank with your hands.  You will start at a slow pace or level on the exercise machine. The exercise difficulty will be slowly increased to raise your heart rate. In the case of a treadmill, the speed and incline will gradually be increased.  You may be asked to periodically breathe into a tube. This measures the gases you breathe out.  You will be asked how you are feeling throughout the test. You will be asked  to rate your level of exertion.  Tell the staff right away if you feel: ? Chest pain. ? Dizziness. ? Shortness of breath. ? Too fatigued to continue. ? Pain or aching in your legs or arms.  You will exercise until you have symptoms or until you reach a target heart rate. The test will also be stopped if you have changes in your blood pressure or ECG readings, or if you develop an irregular heartbeat (arrhythmia). The procedure may vary among health care providers and hospitals. What happens after the procedure?  You will sit down and recover from the exercise. Your blood pressure, heart rate, and ECG will be monitored until you recover.  You may return to your normal schedule, including diet, activities, and medicines, unless your health care provider tells you otherwise.  It is up to you to get your test results. Ask your health care provider, or the department that is doing the test,  when your results will be ready. Summary  An exercise stress test is a test that is done to collect information about how your heart functions during exercise.  This test is done to look for coronary artery disease (CAD).  During this test, you will walk on a treadmill or use an exercise bike to raise your heart rate.  It is important to follow instructions from your health care provider about eating and drinking restrictions before the test. This may include avoiding caffeine and certain medicines before the test. This information is not intended to replace advice given to you by your health care provider. Make sure you discuss any questions you have with your health care provider. Document Revised: 05/27/2017 Document Reviewed: 10/28/2016 Elsevier Patient Education  2020 [employer].

## 2020-01-01 ENCOUNTER — Other Ambulatory Visit: Payer: Self-pay | Admitting: Obstetrics and Gynecology

## 2020-01-01 DIAGNOSIS — R928 Other abnormal and inconclusive findings on diagnostic imaging of breast: Secondary | ICD-10-CM

## 2020-01-02 ENCOUNTER — Encounter (HOSPITAL_COMMUNITY): Payer: Self-pay

## 2020-01-02 ENCOUNTER — Telehealth (HOSPITAL_COMMUNITY): Payer: Self-pay | Admitting: Emergency Medicine

## 2020-01-02 NOTE — Telephone Encounter (Signed)
Attempted to call patient regarding upcoming cardiac CT appointment. °Left message on voicemail with name and callback number °Emojean Gertz RN Navigator Cardiac Imaging °[facility] °[phone] Office °[phone] Cell ° °

## 2020-01-03 ENCOUNTER — Other Ambulatory Visit: Payer: Self-pay

## 2020-01-03 ENCOUNTER — Ambulatory Visit (HOSPITAL_COMMUNITY)
Admission: RE | Admit: 2020-01-03 | Discharge: 2020-01-03 | Disposition: A | Discharge status: Home / Self Care | Payer: BC Managed Care – PPO | Source: Ambulatory Visit | Attending: Internal Medicine | Admitting: Internal Medicine

## 2020-01-03 DIAGNOSIS — I472 Ventricular tachycardia, unspecified: Secondary | ICD-10-CM

## 2020-01-03 MED ORDER — GADOBUTROL 1 MMOL/ML IV SOLN
12.0000 mL | Freq: Once | INTRAVENOUS | Status: AC | PRN
  Administered 2020-01-03: 12 mL via INTRAVENOUS

## 2020-01-05 ENCOUNTER — Other Ambulatory Visit: Payer: Self-pay

## 2020-01-05 ENCOUNTER — Other Ambulatory Visit
Admission: RE | Admit: 2020-01-05 | Discharge: 2020-01-05 | Disposition: A | Discharge status: Home / Self Care | Payer: BC Managed Care – PPO | Source: Ambulatory Visit | Attending: Internal Medicine | Admitting: Internal Medicine

## 2020-01-05 DIAGNOSIS — Z01812 Encounter for preprocedural laboratory examination: Secondary | ICD-10-CM | POA: Insufficient documentation

## 2020-01-05 DIAGNOSIS — Z20822 Contact with and (suspected) exposure to covid-19: Secondary | ICD-10-CM | POA: Insufficient documentation

## 2020-01-05 LAB — SARS CORONAVIRUS 2 (TAT 6-24 HRS): SARS Coronavirus 2: NEGATIVE

## 2020-01-09 ENCOUNTER — Ambulatory Visit (INDEPENDENT_AMBULATORY_CARE_PROVIDER_SITE_OTHER): Payer: BC Managed Care – PPO | Admitting: Internal Medicine

## 2020-01-09 ENCOUNTER — Other Ambulatory Visit: Payer: Self-pay

## 2020-01-09 DIAGNOSIS — I472 Ventricular tachycardia, unspecified: Secondary | ICD-10-CM

## 2020-01-09 DIAGNOSIS — I491 Atrial premature depolarization: Secondary | ICD-10-CM | POA: Diagnosis not present

## 2020-01-09 DIAGNOSIS — Z79899 Other long term (current) drug therapy: Secondary | ICD-10-CM

## 2020-01-09 LAB — EXERCISE TOLERANCE TEST
Estimated workload: 9.2 METS
Exercise duration (min): 4 min
Exercise duration (sec): 29 s
MPHR: 173 {beats}/min
Peak HR: 164 {beats}/min
Percent HR: 94 %
Rest HR: 102 {beats}/min

## 2020-01-09 MED ORDER — LOSARTAN POTASSIUM 25 MG PO TABS
25.0000 mg | ORAL_TABLET | Freq: Every day | ORAL | 3 refills | Status: DC
Start: 2020-01-09 — End: 2020-01-26

## 2020-01-09 NOTE — Patient Instructions (Signed)
Medication Instructions:  - Your physician has recommended you make the following change in your medication:   1) Start Losartan 25 mg- take 1 tablet by mouth once daily   *If you need a refill on your cardiac medications before your next appointment, please call your pharmacy*   Lab Work: - Your physician recommends that you return for lab work in: 2 weeks (around 01/23/20) - [place] [facility], 1st desk on the right (past the screening table) to check in - Lab hours: Monday- Friday (7:30 am- 5:30 pm)  If you have labs (blood work) drawn today and your tests are completely normal, you will receive your results only by: [name] MyChart Message (if you have MyChart) OR . A paper copy in the mail If you have any lab test that is abnormal or we need to change your treatment, we will call you to review the results.   Testing/Procedures: - none ordered   Follow-Up: At [facility], you and your health needs are our priority.  As part of our continuing mission to provide you with exceptional heart care, we have created designated Provider Care Teams.  These Care Teams include your primary Cardiologist (physician) and Advanced Practice Providers (APPs -  Physician Assistants and Nurse Practitioners) who all work together to provide you with the care you need, when you need it.  We recommend signing up for the patient portal called "MyChart".  Sign up information is provided on this After Visit Summary.  MyChart is used to connect with patients for Virtual Visits (Telemedicine).  Patients are able to view lab/test results, encounter notes, upcoming appointments, etc.  Non-urgent messages can be sent to your provider as well.   To learn more about what you can do with MyChart, go to [email or web address].    Your next appointment:   3 month(s)  The format for your next appointment:   In Person  Provider:   Virl Axe, MD   Other Instructions n/a

## 2020-01-10 ENCOUNTER — Other Ambulatory Visit: Payer: Self-pay | Admitting: Internal Medicine

## 2020-01-10 ENCOUNTER — Ambulatory Visit
Admission: RE | Admit: 2020-01-10 | Discharge: 2020-01-10 | Disposition: A | Discharge status: Home / Self Care | Payer: BC Managed Care – PPO | Source: Ambulatory Visit | Attending: Obstetrics and Gynecology | Admitting: Obstetrics and Gynecology

## 2020-01-10 ENCOUNTER — Other Ambulatory Visit: Payer: Self-pay | Admitting: Obstetrics and Gynecology

## 2020-01-10 DIAGNOSIS — R928 Other abnormal and inconclusive findings on diagnostic imaging of breast: Secondary | ICD-10-CM

## 2020-01-10 DIAGNOSIS — N6489 Other specified disorders of breast: Secondary | ICD-10-CM

## 2020-01-11 MED ORDER — ALPRAZOLAM 0.25 MG PO TABS: 0.2500 mg | ORAL_TABLET | Freq: Two times a day (BID) | ORAL | 0 refills | Status: DC | PRN

## 2020-01-11 MED ORDER — GABAPENTIN 100 MG PO CAPS: 100.0000 mg | ORAL_CAPSULE | Freq: Three times a day (TID) | ORAL | 0 refills | Status: DC

## 2020-01-11 NOTE — Telephone Encounter (Signed)
Xanax last filled 06/16/2019... please advise

## 2020-01-12 ENCOUNTER — Other Ambulatory Visit: Payer: Self-pay | Admitting: Internal Medicine

## 2020-01-15 ENCOUNTER — Other Ambulatory Visit: Payer: Self-pay | Admitting: Diagnostic Radiology

## 2020-01-15 ENCOUNTER — Ambulatory Visit
Admission: RE | Admit: 2020-01-15 | Discharge: 2020-01-15 | Disposition: A | Discharge status: Home / Self Care | Payer: BC Managed Care – PPO | Source: Ambulatory Visit | Attending: Obstetrics and Gynecology | Admitting: Obstetrics and Gynecology

## 2020-01-15 ENCOUNTER — Other Ambulatory Visit: Payer: Self-pay

## 2020-01-15 DIAGNOSIS — N6489 Other specified disorders of breast: Secondary | ICD-10-CM

## 2020-01-23 ENCOUNTER — Encounter: Payer: Self-pay | Admitting: Internal Medicine

## 2020-01-26 ENCOUNTER — Ambulatory Visit (INDEPENDENT_AMBULATORY_CARE_PROVIDER_SITE_OTHER): Payer: BC Managed Care – PPO | Admitting: Cardiology

## 2020-01-26 ENCOUNTER — Other Ambulatory Visit: Payer: Self-pay

## 2020-01-26 ENCOUNTER — Encounter: Payer: Self-pay | Admitting: Cardiology

## 2020-01-26 VITALS — BP 138/90 | HR 85 | Ht 65.0 in | Wt 222.0 lb

## 2020-01-26 DIAGNOSIS — I491 Atrial premature depolarization: Secondary | ICD-10-CM

## 2020-01-26 DIAGNOSIS — Z79899 Other long term (current) drug therapy: Secondary | ICD-10-CM

## 2020-01-26 DIAGNOSIS — I10 Essential (primary) hypertension: Secondary | ICD-10-CM

## 2020-01-26 MED ORDER — LOSARTAN POTASSIUM 50 MG PO TABS
50.0000 mg | ORAL_TABLET | Freq: Every day | ORAL | Status: DC
Start: 2020-01-26 — End: 2021-01-06

## 2020-01-26 NOTE — Patient Instructions (Addendum)
Medication Instructions:  - Your physician has recommended you make the following change in your medication:   1) Increase losartan to 50 mg- take 1 tablet by mouth once daily - You may take losartan 25 mg - 2 tablets (50 mg) once daily until you use up your current supply - let us know if you would like the 50 mg tablet sent to the pharmacy prior to your next appointment  *If you need a refill on your cardiac medications before your next appointment, please call your pharmacy*   Lab Work: - Your physician recommends that you have lab work today: BMP   If you have labs (blood work) drawn today and your tests are completely normal, you will receive your results only by: [name] MyChart Message (if you have MyChart) OR . A paper copy in the mail If you have any lab test that is abnormal or we need to change your treatment, we will call you to review the results.   Testing/Procedures: - none ordered   Follow-Up: At [facility], you and your health needs are our priority.  As part of our continuing mission to provide you with exceptional heart care, we have created designated Provider Care Teams.  These Care Teams include your primary Cardiologist (physician) and Advanced Practice Providers (APPs -  Physician Assistants and Nurse Practitioners) who all work together to provide you with the care you need, when you need it.  We recommend signing up for the patient portal called "MyChart".  Sign up information is provided on this After Visit Summary.  MyChart is used to connect with patients for Virtual Visits (Telemedicine).  Patients are able to view lab/test results, encounter notes, upcoming appointments, etc.  Non-urgent messages can be sent to your provider as well.   To learn more about what you can do with MyChart, go to [email or web address].    Your next appointment:   2 week(s)  The format for your next appointment:   In Person  Provider:    You may see Ida Rogue, MD or  one of the following Advanced Practice Providers on your designated Care Team:    Murray Hodgkins, NP  Christell Faith, PA-C  Marrianne Mood, PA-C    Other Instructions n/a

## 2020-01-26 NOTE — Progress Notes (Signed)
Cardiology Office Note:    Date:  01/26/2020   ID:  [name], DOB [birth year], MRN [id number]  PCP:  Jearld Fenton, NP  Cardiologist:  Ida Rogue, MD  Electrophysiologist:  None   Referring MD: Jearld Fenton, NP   Chief Complaint  Patient presents with  . Other    Patient c/o Elevated BP. Meds reviewed verbally with patient.     History of Present Illness:    [name] is a 49 y.o. female with a hx of anxiety, tension, hypothyroidism who presents due to elevated blood pressures.  Patient has a history of palpitations being seen by Dr. Rockey Situ Dr. Belva Chimes.  Cardiac monitor did reveal frequent PACs.  She was started on beta-blockers and unfortunately developed fatigue and lethargy.  Verapamil was tried but patient did not tolerate.  She was subsequently started on flecainide.  She went an exercise tolerance test about 10 days ago where she was noted to be hypertensive.  Losartan 25 mg daily was started.  She has been taking losartan as prescribed and checking her blood pressures frequently at home.  Over the past couple of days, she has noted elevated blood pressures in the [id number] to [id number] systolic sometimes associated with headaches.  Echocardiogram showed normal systolic and diastolic function, borderline LVH.  Past Medical History:  Diagnosis Date  . Allergy   . Anxiety   . Frequent headaches   . Hypothyroidism     Past Surgical History:  Procedure Laterality Date  . APPENDECTOMY  2013  . lapband  2013  . TONSILLECTOMY  2012    Current Medications: Current Meds  Medication Sig  . ALPRAZolam (XANAX) 0.25 MG tablet Take 1 tablet (0.25 mg total) by mouth 2 (two) times daily as needed.  . AMETHIA 0.15-0.03 &0.01 MG tablet Take 1 tablet by mouth daily.  . butalbital-acetaminophen-caffeine (FIORICET) 50-325-40 MG tablet Take 1 tablet by mouth every 6 (six) hours as needed for headache.  . cyclobenzaprine (FLEXERIL) 10 MG tablet TAKE 1 TABLET BY MOUTH THREE  TIMES A DAY AS NEEDED FOR MUSCLE SPASMS  . flecainide (TAMBOCOR) 50 MG tablet Take 1 tablet (50 mg total) by mouth 2 (two) times daily.  . fluticasone (FLONASE) 50 MCG/ACT nasal spray Place 2 sprays into both nostrils daily.  [name] gabapentin (NEURONTIN) 100 MG capsule Take 1 capsule (100 mg total) by mouth 3 (three) times daily.  [name] levocetirizine (XYZAL) 5 MG tablet TAKE 1 TABLET BY MOUTH EVERY DAY IN THE EVENING  . levothyroxine (SYNTHROID) 112 MCG tablet TAKE 1 TABLET (112 MCG TOTAL) BY MOUTH DAILY BEFORE BREAKFAST.  [name] losartan (COZAAR) 25 MG tablet Take 1 tablet (25 mg total) by mouth daily.  . pravastatin (PRAVACHOL) 40 MG tablet TAKE 1 TABLET BY MOUTH EVERY DAY  . Prenatal Vit-Fe Fumarate-FA (PRENATAL MULTIVITAMIN) TABS tablet Take 1 tablet by mouth daily at 12 noon.  . rizatriptan (MAXALT-MLT) 10 MG disintegrating tablet TAKE 1 TABLET BY MOUTH AS NEEDED FOR MIGRAINE. MAY REPEAT IN 2 HOURS IF NEEDED  . sertraline (ZOLOFT) 100 MG tablet TAKE 1 TABLET BY MOUTH EVERY DAY  . SUMAtriptan (IMITREX) 25 MG tablet Take 1 tablet (25 mg total) by mouth every 2 (two) hours as needed for migraine. May repeat in 2 hours if headache persists or recurs.  . valACYclovir (VALTREX) 500 MG tablet Take 500 mg by mouth daily.     Allergies:   Ciprofloxacin, Keflex [cephalexin], and Trazodone and nefazodone   Social History   Socioeconomic History  .  Marital status: Single    Spouse name: Not on file  . Number of children: Not on file  . Years of education: Not on file  . Highest education level: Not on file  Occupational History  . Not on file  Tobacco Use  . Smoking status: Never Smoker  . Smokeless tobacco: Never Used  Substance and Sexual Activity  . Alcohol use: Yes    Comment: occasional  . Drug use: No  . Sexual activity: Yes    Partners: Male  Other Topics Concern  . Not on file  Social History Narrative  . Not on file   Social Determinants of Health   Financial Resource Strain:   .  Difficulty of Paying Living Expenses:   Food Insecurity:   . Worried About Charity fundraiser in the Last Year:   . Arboriculturist in the Last Year:   Transportation Needs:   . Film/video editor (Medical):   [name] Lack of Transportation (Non-Medical):   Physical Activity:   . Days of Exercise per Week:   . Minutes of Exercise per Session:   Stress:   . Feeling of Stress :   Social Connections:   . Frequency of Communication with Friends and Family:   . Frequency of Social Gatherings with Friends and Family:   . Attends Religious Services:   . Active Member of Clubs or Organizations:   . Attends Archivist Meetings:   [name] Marital Status:      Family History: The patient's family history includes Breast cancer in her mother; Cancer in her father and mother; Depression in her mother; Hyperlipidemia in her father.  ROS:   Please see the history of present illness.     All other systems reviewed and are negative.  EKGs/Labs/Other Studies Reviewed:    The following studies were reviewed today:   EKG:  EKG not obtained today.  Recent Labs: 12/21/2019: ALT 5; BUN 14; Creatinine, Ser 1.01; Hemoglobin 10.2 Repeated and verified X2.; Platelets 296.0; Potassium 4.4; Sodium 139; TSH 0.73  Recent Lipid Panel    Component Value Date/Time   CHOL 184 12/21/2019 1204   TRIG 94.0 12/21/2019 1204   HDL 38.00 (L) 12/21/2019 1204   CHOLHDL 5 12/21/2019 1204   VLDL 18.8 12/21/2019 1204   LDLCALC 127 (H) 12/21/2019 1204    Physical Exam:    VS:  BP 138/90 (BP Location: Right Arm, Patient Position: Sitting, Cuff Size: Normal)   Pulse 85   Ht 5\' 5"  (1.651 m)   Wt 222 lb (100.7 kg)   SpO2 98%   BMI 36.94 kg/m     Wt Readings from Last 3 Encounters:  01/26/20 222 lb (100.7 kg)  12/21/19 225 lb 6 oz (102.2 kg)  11/09/19 226 lb 8 oz (102.7 kg)     GEN:  Well nourished, well developed in no acute distress HEENT: Normal NECK: No JVD; No carotid bruits LYMPHATICS: No  lymphadenopathy CARDIAC: RRR, no murmurs, rubs, gallops RESPIRATORY:  Clear to auscultation without rales, wheezing or rhonchi  ABDOMEN: Soft, non-tender, non-distended MUSCULOSKELETAL:  No edema; No deformity  SKIN: Warm and dry NEUROLOGIC:  Alert and oriented x 3 PSYCHIATRIC:  Normal affect   ASSESSMENT:    1. Essential hypertension   2. PAC (premature atrial contraction)   3. Medication management    PLAN:    In order of problems listed above:  1. Patient with history of hypertension, blood pressure still elevated.  Systolic today  was 150 in the office.  Increase losartan to 50 mg daily.  Check BMP as previously scheduled. 2. History of frequent PACs, on flecainide.  Continue, keep follow-up appointments with EP.  Follow-up in 2 weeks for blood pressure check with NP/PA or Dr. Rockey Situ.  This note was generated in part or whole with voice recognition software. Voice recognition is usually quite accurate but there are transcription errors that can and very often do occur. I apologize for any typographical errors that were not detected and corrected.  Medication Adjustments/Labs and Tests Ordered: Current medicines are reviewed at length with the patient today.  Concerns regarding medicines are outlined above.  Orders Placed This Encounter  Procedures  . Basic metabolic panel   No orders of the defined types were placed in this encounter.   Patient Instructions  Medication Instructions:  - Your physician has recommended you make the following change in your medication:   1) Increase losartan to 50 mg- take 1 tablet by mouth once daily   *If you need a refill on your cardiac medications before your next appointment, please call your pharmacy*   Lab Work: - none ordered  If you have labs (blood work) drawn today and your tests are completely normal, you will receive your results only by: [name] MyChart Message (if you have MyChart) OR . A paper copy in the mail If you have  any lab test that is abnormal or we need to change your treatment, we will call you to review the results.   Testing/Procedures: - none ordered   Follow-Up: At [facility], you and your health needs are our priority.  As part of our continuing mission to provide you with exceptional heart care, we have created designated Provider Care Teams.  These Care Teams include your primary Cardiologist (physician) and Advanced Practice Providers (APPs -  Physician Assistants and Nurse Practitioners) who all work together to provide you with the care you need, when you need it.  We recommend signing up for the patient portal called "MyChart".  Sign up information is provided on this After Visit Summary.  MyChart is used to connect with patients for Virtual Visits (Telemedicine).  Patients are able to view lab/test results, encounter notes, upcoming appointments, etc.  Non-urgent messages can be sent to your provider as well.   To learn more about what you can do with MyChart, go to [email or web address].    Your next appointment:   2 week(s)  The format for your next appointment:   In Person  Provider:    You may see Ida Rogue, MD or one of the following Advanced Practice Providers on your designated Care Team:    Murray Hodgkins, NP  Christell Faith, PA-C  Marrianne Mood, PA-C    Other Instructions n/a     Signed, Kate Sable, MD  01/26/2020 3:54 PM    [place]

## 2020-01-27 LAB — BASIC METABOLIC PANEL
BUN/Creatinine Ratio: 11 (ref 9–23)
BUN: 11 mg/dL (ref 6–24)
CO2: 23 mmol/L (ref 20–29)
Calcium: 9.1 mg/dL (ref 8.7–10.2)
Chloride: 105 mmol/L (ref 96–106)
Creatinine, Ser: 0.96 mg/dL (ref 0.57–1.00)
GFR calc Af Amer: 81 mL/min/{1.73_m2} (ref >59)
GFR calc non Af Amer: 71 mL/min/{1.73_m2} (ref >59)
Glucose: 96 mg/dL (ref 65–99)
Potassium: 4.3 mmol/L (ref 3.5–5.2)
Sodium: 143 mmol/L (ref 134–144)

## 2020-02-12 ENCOUNTER — Ambulatory Visit: Payer: BC Managed Care – PPO | Admitting: Family

## 2020-02-12 NOTE — Progress Notes (Deleted)
Office Visit    Patient Name: Kamrin Spath Date of Encounter: 02/12/2020  Primary Care Provider:  Jearld Fenton, NP Primary Cardiologist:  Ida Rogue, MD Electrophysiologist:  None   Chief Complaint    [name] is a 49 y.o. female with a hx of anxiety, HTN, hypothyroidism presents today for follow up of blood pressure.    Past Medical History    Past Medical History:  Diagnosis Date  . Allergy   . Anxiety   . Frequent headaches   . Hypothyroidism    Past Surgical History:  Procedure Laterality Date  . APPENDECTOMY  2013  . lapband  2013  . TONSILLECTOMY  2012    Allergies  Allergies  Allergen Reactions  . Ciprofloxacin     anxiety  . Keflex [Cephalexin] Hives  . Trazodone And Nefazodone Other (See Comments)    nightmares    History of Present Illness    [name] is a 49 y.o. female with a hx of anxiety, HTN, hypothyroidism. She was last seen 01/26/20 by Dr. Garen Lah.  She has previously been evaluated by Dr. Rockey Situ and Dr. Caryl Comes for palpitations. Cardiac monitor 02/2019 showed frequent PAC. Started on beta blockers, but had fatigue and lethargy. Verapamil was trialed, but she did not tolerate. She was then started on Flecainide. Echo 06/2019 with LVEF 55-60%, borderline LVH, RV mildly enlarged, mild MR/TR. Exercise tolerance test 01/09/20 with noted hypertension and she was started on Losartan 25mg  daily. Cardiac MRI 12/2019 with normal LVEF, no RWMA, no delayed gadolinium uptake, normal aortic root 3.2cm.  At clinic visit 01/26/20 her Losartan was increased to 50mg  daily due to continued hypertension.  ***  EKGs/Labs/Other Studies Reviewed:   The following studies were reviewed today:  Cardiac MRI 12/2019 IMPRESSION: 1. Normal LVEF with no RWMA's or LVH EF 53%   2.  No delayed gadolinium uptake in the LV myocardium   3. Mild RVE but normal RVEF 61% with no focal areas of hypokinesis or evidence of RV dysplasia   4.   Normal cardiac valves   5.  Normal aortic root 3.2 cm   6.  Normal cardiac valves  EKG:  EKG is ordered today.  The ekg ordered today demonstrates ***  Recent Labs: 12/21/2019: ALT 5; Hemoglobin 10.2 Repeated and verified X2.; Platelets 296.0; TSH 0.73 01/26/2020: BUN 11; Creatinine, Ser 0.96; Potassium 4.3; Sodium 143  Recent Lipid Panel    Component Value Date/Time   CHOL 184 12/21/2019 1204   TRIG 94.0 12/21/2019 1204   HDL 38.00 (L) 12/21/2019 1204   CHOLHDL 5 12/21/2019 1204   VLDL 18.8 12/21/2019 1204   LDLCALC 127 (H) 12/21/2019 1204    Home Medications   No outpatient medications have been marked as taking for the 02/12/20 encounter (Appointment) with Loel Dubonnet, NP.    Review of Systems    ***   ROS All other systems reviewed and are otherwise negative except as noted above.  Physical Exam    VS:  There were no vitals taken for this visit. , BMI There is no height or weight on file to calculate BMI. GEN: Well nourished, well developed, in no acute distress. HEENT: normal. Neck: Supple, no JVD, carotid bruits, or masses. Cardiac: ***RRR, no murmurs, rubs, or gallops. No clubbing, cyanosis, edema.  ***Radials/DP/PT 2+ and equal bilaterally.  Respiratory:  ***Respirations regular and unlabored, clear to auscultation bilaterally. GI: Soft, nontender, nondistended, BS + x 4. MS: No deformity or atrophy. Skin: Warm  and dry, no rash. Neuro:  Strength and sensation are intact. Psych: Normal affect.  Assessment & Plan    1. HTN -  2. Frequent PAC - Noted by ZIO. Previous intolerance to Metoprolol and Verapamil. *** 3. High risk medication use - On Flecainide secondary to frequent PAC.  Disposition: Follow up {follow up:15908} with ***   Loel Dubonnet, NP 02/12/2020, 12:46 PM

## 2020-02-13 ENCOUNTER — Other Ambulatory Visit: Payer: Self-pay | Admitting: Internal Medicine

## 2020-02-15 ENCOUNTER — Ambulatory Visit: Payer: BC Managed Care – PPO | Admitting: Family

## 2020-02-15 NOTE — Progress Notes (Deleted)
Office Visit    Patient Name: [name] Date of Encounter: 02/15/2020  Primary Care Provider:  Jearld Fenton, NP Primary Cardiologist:  Ida Rogue, MD Electrophysiologist:  None   Chief Complaint    [name] is a 49 y.o. female with a hx of anxiety, HTN, hypothyroidism presents today for follow up of blood pressure.    Past Medical History    Past Medical History:  Diagnosis Date  . Allergy   . Anxiety   . Frequent headaches   . Hypothyroidism    Past Surgical History:  Procedure Laterality Date  . APPENDECTOMY  2013  . lapband  2013  . TONSILLECTOMY  2012    Allergies  Allergies  Allergen Reactions  . Ciprofloxacin     anxiety  . Keflex [Cephalexin] Hives  . Trazodone And Nefazodone Other (See Comments)    nightmares    History of Present Illness    [name] is a 49 y.o. female with a hx of anxiety, HTN, hypothyroidism. She was last seen 01/26/20 by Dr. Garen Lah.  She has previously been evaluated by Dr. Rockey Situ and Dr. Caryl Comes for palpitations. Cardiac monitor 02/2019 showed frequent PAC. Started on beta blockers, but had fatigue and lethargy. Verapamil was trialed, but she did not tolerate. She was then started on Flecainide. Echo 06/2019 with LVEF 55-60%, borderline LVH, RV mildly enlarged, mild MR/TR. Exercise tolerance test 01/09/20 with noted hypertension and she was started on Losartan 25mg  daily. Cardiac MRI 12/2019 with normal LVEF, no RWMA, no delayed gadolinium uptake, normal aortic root 3.2cm.  At clinic visit 01/26/20 her Losartan was increased to 50mg  daily due to continued hypertension.  ***  EKGs/Labs/Other Studies Reviewed:   The following studies were reviewed today:  Cardiac MRI 12/2019 IMPRESSION: 1. Normal LVEF with no RWMA's or LVH EF 53%   2.  No delayed gadolinium uptake in the LV myocardium   3. Mild RVE but normal RVEF 61% with no focal areas of hypokinesis or evidence of RV dysplasia   4.   Normal cardiac valves   5.  Normal aortic root 3.2 cm   6.  Normal cardiac valves  EKG:  EKG is ordered today.  The ekg ordered today demonstrates ***  Recent Labs: 12/21/2019: ALT 5; Hemoglobin 10.2 Repeated and verified X2.; Platelets 296.0; TSH 0.73 01/26/2020: BUN 11; Creatinine, Ser 0.96; Potassium 4.3; Sodium 143  Recent Lipid Panel    Component Value Date/Time   CHOL 184 12/21/2019 1204   TRIG 94.0 12/21/2019 1204   HDL 38.00 (L) 12/21/2019 1204   CHOLHDL 5 12/21/2019 1204   VLDL 18.8 12/21/2019 1204   LDLCALC 127 (H) 12/21/2019 1204    Home Medications   No outpatient medications have been marked as taking for the 02/15/20 encounter (Appointment) with Loel Dubonnet, NP.    Review of Systems    ***   ROS All other systems reviewed and are otherwise negative except as noted above.  Physical Exam    VS:  There were no vitals taken for this visit. , BMI There is no height or weight on file to calculate BMI. GEN: Well nourished, well developed, in no acute distress. HEENT: normal. Neck: Supple, no JVD, carotid bruits, or masses. Cardiac: ***RRR, no murmurs, rubs, or gallops. No clubbing, cyanosis, edema.  ***Radials/DP/PT 2+ and equal bilaterally.  Respiratory:  ***Respirations regular and unlabored, clear to auscultation bilaterally. GI: Soft, nontender, nondistended, BS + x 4. MS: No deformity or atrophy. Skin: Warm  and dry, no rash. Neuro:  Strength and sensation are intact. Psych: Normal affect.  Assessment & Plan    1. HTN -  2. Frequent PAC - Noted by ZIO. Previous intolerance to Metoprolol and Verapamil. *** 3. High risk medication use - On Flecainide secondary to frequent PAC. Hx of intolerance to CCB and BB. ETT 01/09/20 showed no ischemia and hypertensive response to exercise.   Disposition: Follow up {follow up:15908} with Dr. Rockey Situ or APP.   Loel Dubonnet, NP 02/15/2020, 7:52 AM

## 2020-02-16 ENCOUNTER — Encounter: Payer: Self-pay | Admitting: Family

## 2020-02-25 ENCOUNTER — Other Ambulatory Visit: Payer: Self-pay

## 2020-02-25 ENCOUNTER — Encounter: Payer: Self-pay | Admitting: *Deleted

## 2020-02-25 DIAGNOSIS — R1031 Right lower quadrant pain: Secondary | ICD-10-CM | POA: Insufficient documentation

## 2020-02-25 DIAGNOSIS — R102 Pelvic and perineal pain: Secondary | ICD-10-CM | POA: Diagnosis not present

## 2020-02-25 DIAGNOSIS — Z79899 Other long term (current) drug therapy: Secondary | ICD-10-CM | POA: Insufficient documentation

## 2020-02-25 DIAGNOSIS — N12 Tubulo-interstitial nephritis, not specified as acute or chronic: Secondary | ICD-10-CM | POA: Insufficient documentation

## 2020-02-25 DIAGNOSIS — E039 Hypothyroidism, unspecified: Secondary | ICD-10-CM | POA: Insufficient documentation

## 2020-02-25 LAB — CBC
HCT: 31.5 % — ABNORMAL LOW (ref 36.0–46.0)
Hemoglobin: 10.3 g/dL — ABNORMAL LOW (ref 12.0–15.0)
MCH: 24.9 pg — ABNORMAL LOW (ref 26.0–34.0)
MCHC: 32.7 g/dL (ref 30.0–36.0)
MCV: 76.3 fL — ABNORMAL LOW (ref 80.0–100.0)
Platelets: 310 10*3/uL (ref 150–400)
RBC: 4.13 MIL/uL (ref 3.87–5.11)
RDW: 17 % — ABNORMAL HIGH (ref 11.5–15.5)
WBC: 6.5 10*3/uL (ref 4.0–10.5)
nRBC: 0 % (ref 0.0–0.2)

## 2020-02-25 LAB — COMPREHENSIVE METABOLIC PANEL
ALT: 9 U/L (ref 0–44)
AST: 18 U/L (ref 15–41)
Albumin: 3.8 g/dL (ref 3.5–5.0)
Alkaline Phosphatase: 78 U/L (ref 38–126)
Anion gap: 7 (ref 5–15)
BUN: 16 mg/dL (ref 6–20)
CO2: 23 mmol/L (ref 22–32)
Calcium: 8.8 mg/dL — ABNORMAL LOW (ref 8.9–10.3)
Chloride: 109 mmol/L (ref 98–111)
Creatinine, Ser: 1.1 mg/dL — ABNORMAL HIGH (ref 0.44–1.00)
GFR calc Af Amer: >60 mL/min (ref >60)
GFR calc non Af Amer: 60 mL/min — ABNORMAL LOW (ref >60)
Glucose, Bld: 139 mg/dL — ABNORMAL HIGH (ref 70–99)
Potassium: 3.7 mmol/L (ref 3.5–5.1)
Sodium: 139 mmol/L (ref 135–145)
Total Bilirubin: 0.3 mg/dL (ref 0.3–1.2)
Total Protein: 7.1 g/dL (ref 6.5–8.1)

## 2020-02-25 LAB — LIPASE, BLOOD: Lipase: 25 U/L (ref 11–51)

## 2020-02-25 LAB — URINALYSIS, COMPLETE (UACMP) WITH MICROSCOPIC
Bilirubin Urine: NEGATIVE
Glucose, UA: NEGATIVE mg/dL
Ketones, ur: NEGATIVE mg/dL
Leukocytes,Ua: NEGATIVE
Nitrite: NEGATIVE
Protein, ur: 100 mg/dL — AB
Specific Gravity, Urine: 1.033 — ABNORMAL HIGH (ref 1.005–1.030)
Squamous Epithelial / HPF: >50 — ABNORMAL HIGH (ref 0–5)
pH: 5 (ref 5.0–8.0)

## 2020-02-25 LAB — POC URINE PREG, ED: Preg Test, Ur: NEGATIVE

## 2020-02-25 NOTE — ED Triage Notes (Signed)
Pt to ED reporting sudden onset of lower abd and pelvic pain. Pain is constant and worse when walking. Pt reportedly felt like she needed to urinate twice but was unable to do so. One episode of vomiting. Nausea continues at this time. Pt reports having taken her prescribed gabapentin and half a percocet without pain relief.

## 2020-02-26 ENCOUNTER — Emergency Department
Admission: EM | Admit: 2020-02-26 | Discharge: 2020-02-26 | Disposition: A | Discharge status: Home / Self Care | Payer: BC Managed Care – PPO | Attending: Emergency Medicine | Admitting: Emergency Medicine

## 2020-02-26 ENCOUNTER — Emergency Department: Payer: BC Managed Care – PPO

## 2020-02-26 DIAGNOSIS — R1031 Right lower quadrant pain: Secondary | ICD-10-CM

## 2020-02-26 DIAGNOSIS — N12 Tubulo-interstitial nephritis, not specified as acute or chronic: Secondary | ICD-10-CM

## 2020-02-26 MED ORDER — DOXYCYCLINE HYCLATE 100 MG PO TABS
100.0000 mg | ORAL_TABLET | Freq: Once | ORAL | Status: AC
  Administered 2020-02-26: 100 mg via ORAL
  Filled 2020-02-26: qty 1

## 2020-02-26 MED ORDER — OXYCODONE-ACETAMINOPHEN 5-325 MG PO TABS: 1.0000 | ORAL_TABLET | ORAL | 0 refills | Status: DC | PRN

## 2020-02-26 MED ORDER — MORPHINE SULFATE (PF) 4 MG/ML IV SOLN
4.0000 mg | Freq: Once | INTRAVENOUS | Status: AC
  Administered 2020-02-26: 4 mg via INTRAVENOUS
  Filled 2020-02-26: qty 1

## 2020-02-26 MED ORDER — ONDANSETRON 4 MG PO TBDP
4.0000 mg | ORAL_TABLET | Freq: Three times a day (TID) | ORAL | 0 refills | Status: DC | PRN
Start: 2020-02-26 — End: 2021-01-02

## 2020-02-26 MED ORDER — SODIUM CHLORIDE 0.9 % IV BOLUS
1000.0000 mL | Freq: Once | INTRAVENOUS | Status: AC
  Administered 2020-02-26: 1000 mL via INTRAVENOUS

## 2020-02-26 MED ORDER — ONDANSETRON HCL 4 MG/2ML IJ SOLN
4.0000 mg | Freq: Once | INTRAMUSCULAR | Status: AC
  Administered 2020-02-26: 4 mg via INTRAVENOUS
  Filled 2020-02-26: qty 2

## 2020-02-26 MED ORDER — DOXYCYCLINE HYCLATE 50 MG PO CAPS
100.0000 mg | ORAL_CAPSULE | Freq: Two times a day (BID) | ORAL | 0 refills | Status: DC
Start: 2020-02-26 — End: 2020-04-25

## 2020-02-26 NOTE — ED Provider Notes (Signed)
[facility] Emergency Department Provider Note   ____________________________________________   First MD Initiated Contact with Patient 02/26/20 [phone]     (approximate)  I have reviewed the triage vital signs and the nursing notes.   HISTORY  Chief Complaint Abdominal Pain    HPI [name] is a 49 y.o. female who presents to the ED from home with a chief complaint of right lower quadrant/pelvic pain.  Patient reports sudden onset of pain approximately 5 PM last evening.  Reports sharp, constant pain exacerbated with walking.  States she felt like she needed to urinate twice but was only able to dribble.  1 episode of vomiting.  Took gabapentin and one half Percocet without relief of symptoms.  History of kidney stones.  No personal history of ovarian cyst.  Denies fever, chills, chest pain, shortness of breath, vaginal bleeding, diarrhea.       Past Medical History:  Diagnosis Date  . Allergy   . Anxiety   . Frequent headaches   . Hypothyroidism     Patient Active Problem List   Diagnosis Date Noted  . Palpitations 08/19/2019  . HLD (hyperlipidemia) 08/19/2019  . Breast pain, left 08/19/2019  . 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 GAD (generalized anxiety disorder) 06/03/2018  . Chronic headaches 09/21/2013  . Hypothyroidism 09/21/2013    Past Surgical History:  Procedure Laterality Date  . APPENDECTOMY  2013  . lapband  2013  . TONSILLECTOMY  2012    Prior to Admission medications   Medication Sig Start Date End Date Taking? Authorizing Provider  ALPRAZolam (XANAX) 0.25 MG tablet Take 1 tablet (0.25 mg total) by mouth 2 (two) times daily as needed. 01/11/20   Jearld Fenton, NP  AMETHIA 0.15-0.03 &0.01 MG tablet Take 1 tablet by mouth daily. 11/05/14   Jearld Fenton, NP  butalbital-acetaminophen-caffeine (FIORICET) [phone] MG tablet Take 1 tablet by mouth every 6 (six) hours as needed for headache. 05/02/19    Jearld Fenton, NP  cyclobenzaprine (FLEXERIL) 10 MG tablet TAKE 1 TABLET BY MOUTH THREE TIMES A DAY AS NEEDED FOR MUSCLE SPASMS 06/16/19   Jearld Fenton, NP  doxycycline (VIBRAMYCIN) 50 MG capsule Take 2 capsules (100 mg total) by mouth 2 (two) times daily. 02/26/20   Paulette Blanch, MD  flecainide (TAMBOCOR) 50 MG tablet Take 1 tablet (50 mg total) by mouth 2 (two) times daily. 12/21/19   Deboraha Sprang, MD  fluticasone [facility]) 50 MCG/ACT nasal spray Place 2 sprays into both nostrils daily. 04/25/19   Jearld Fenton, NP  gabapentin (NEURONTIN) 100 MG capsule Take 1 capsule (100 mg total) by mouth 3 (three) times daily. 01/11/20   Jearld Fenton, NP  levocetirizine (XYZAL) 5 MG tablet TAKE 1 TABLET BY MOUTH EVERY DAY IN THE EVENING 11/06/19   Jearld Fenton, NP  levothyroxine (SYNTHROID) 112 MCG tablet TAKE 1 TABLET (112 MCG TOTAL) BY MOUTH DAILY BEFORE BREAKFAST. 02/14/20   Jearld Fenton, NP  losartan (COZAAR) 50 MG tablet Take 1 tablet (50 mg total) by mouth daily. 01/26/20 04/25/20  Kate Sable, MD  ondansetron (ZOFRAN ODT) 4 MG disintegrating tablet Take 1 tablet (4 mg total) by mouth every 8 (eight) hours as needed for nausea or vomiting. 02/26/20   Paulette Blanch, MD  oxyCODONE-acetaminophen (PERCOCET/ROXICET) 5-325 MG tablet Take 1 tablet by mouth every 4 (four) hours as needed for severe pain. 02/26/20   Paulette Blanch, MD  pravastatin (  PRAVACHOL) 40 MG tablet TAKE 1 TABLET BY MOUTH EVERY DAY 01/12/20   Jearld Fenton, NP  Prenatal Vit-Fe Fumarate-FA (PRENATAL MULTIVITAMIN) TABS tablet Take 1 tablet by mouth daily at 12 noon.    [provider]  rizatriptan (MAXALT-MLT) 10 MG disintegrating tablet TAKE 1 TABLET BY MOUTH AS NEEDED FOR MIGRAINE. MAY REPEAT IN 2 HOURS IF NEEDED 10/10/19   Jearld Fenton, NP  sertraline (ZOLOFT) 100 MG tablet TAKE 1 TABLET BY MOUTH EVERY DAY 10/24/19   Jearld Fenton, NP  SUMAtriptan (IMITREX) 25 MG tablet Take 1 tablet (25 mg total) by mouth every 2 (two)  hours as needed for migraine. May repeat in 2 hours if headache persists or recurs. 08/15/19   Jearld Fenton, NP  valACYclovir (VALTREX) 500 MG tablet Take 500 mg by mouth daily. 05/06/18   [provider]    Allergies Ciprofloxacin, Keflex [cephalexin], and Trazodone and nefazodone  Family History  Problem Relation Age of Onset  . Cancer Mother   . Depression Mother   . Breast cancer Mother   . Cancer Father   . Hyperlipidemia Father     Social History Social History   Tobacco Use  . Smoking status: Never Smoker  . Smokeless tobacco: Never Used  Substance Use Topics  . Alcohol use: Yes    Comment: occasional  . Drug use: No    Review of Systems  Constitutional: No fever/chills Eyes: No visual changes. ENT: No sore throat. Cardiovascular: Denies chest pain. Respiratory: Denies shortness of breath. Gastrointestinal: Positive for right lower quadrant/pelvic pain, nausea and vomiting.  No diarrhea.  No constipation. Genitourinary: Negative for dysuria. Musculoskeletal: Negative for back pain. Skin: Negative for rash. Neurological: Negative for headaches, focal weakness or numbness.   ____________________________________________   PHYSICAL EXAM:  VITAL SIGNS: ED Triage Vitals  Enc Vitals Group     BP 02/26/20 0340 (!) 153/102     Pulse Rate 02/26/20 0340 68     Resp 02/26/20 0340 18     Temp 02/26/20 0340 98.1 F (36.7 C)     Temp Source 02/26/20 0340 Oral     SpO2 02/26/20 0340 99 %     Weight 02/25/20 2227 216 lb (98 kg)     Height 02/25/20 2227 5\' 5"  (1.651 m)     Head Circumference --      Peak Flow --      Pain Score 02/25/20 2227 10     Pain Loc --      Pain Edu? --      Excl. in [place]? --     Constitutional: Alert and oriented. Well appearing and in no acute distress. Eyes: Conjunctivae are normal. PERRL. EOMI. Head: Atraumatic. Nose: No congestion/rhinnorhea. Mouth/Throat: Mucous membranes are moist.  Oropharynx non-erythematous. Neck:  No stridor.   Cardiovascular: Normal rate, regular rhythm. Grossly normal heart sounds.  Good peripheral circulation. Respiratory: Normal respiratory effort.  No retractions. Lungs CTAB. Gastrointestinal: Soft and mild tenderness to palpation right pelvis without rebound or guarding. No distention. No abdominal bruits. No CVA tenderness. Musculoskeletal: No lower extremity tenderness nor edema.  No joint effusions. Neurologic:  Normal speech and language. No gross focal neurologic deficits are appreciated. No gait instability. Skin:  Skin is warm, dry and intact. No rash noted. Psychiatric: Mood and affect are normal. Speech and behavior are normal.  ____________________________________________   LABS (all labs ordered are listed, but only abnormal results are displayed)  Labs Reviewed  COMPREHENSIVE METABOLIC PANEL -  Abnormal; Notable for the following components:      Result Value   Glucose, Bld 139 (*)    Creatinine, Ser 1.10 (*)    Calcium 8.8 (*)    GFR calc non Af Amer 60 (*)    All other components within normal limits  CBC - Abnormal; Notable for the following components:   Hemoglobin 10.3 (*)    HCT 31.5 (*)    MCV 76.3 (*)    MCH 24.9 (*)    RDW 17.0 (*)    All other components within normal limits  URINALYSIS, COMPLETE (UACMP) WITH MICROSCOPIC - Abnormal; Notable for the following components:   Color, Urine AMBER (*)    APPearance TURBID (*)    Specific Gravity, Urine 1.033 (*)    Hgb urine dipstick SMALL (*)    Protein, ur 100 (*)    Bacteria, UA MANY (*)    Squamous Epithelial / LPF >50 (*)    All other components within normal limits  URINE CULTURE  LIPASE, BLOOD  POC URINE PREG, ED   ____________________________________________  EKG  None ____________________________________________  RADIOLOGY  ED MD interpretation: Mild right-sided perinephric and periureteral stranding could be due to recently passed stone or mild  pyelonephritis/ureteritis  Official radiology report(s): CT Renal Stone Study  Result Date: 02/26/2020 CLINICAL DATA:  Right lower abdominal pain and flank pain EXAM: CT ABDOMEN AND PELVIS WITHOUT CONTRAST TECHNIQUE: Multidetector CT imaging of the abdomen and pelvis was performed following the standard protocol without IV contrast. COMPARISON:  None. FINDINGS: Lower chest: The visualized heart size within normal limits. No pericardial fluid/thickening. No hiatal hernia. The visualized portions of the lungs are clear. Hepatobiliary: Although limited due to the lack of intravenous contrast, normal in appearance without gross focal abnormality. No evidence of calcified gallstones or biliary ductal dilatation. Pancreas:  Unremarkable.  No surrounding inflammatory changes. Spleen: Normal in size. Although limited due to the lack of intravenous contrast, normal in appearance. Adrenals/Urinary Tract: Both adrenal glands appear normal. There are multiple right-sided renal calculi present the largest measuring 6 mm in the upper pole. No hydronephrosis however is seen. There is mild right-sided perinephric stranding and periureteral stranding down to the level of the UVJ. No left-sided renal or bladder calculi are noted. Stomach/Bowel: The patient is status post lap band just at the GE junction/proximal stomach. The small bowel and colon are normal in size and contour. The patient is status post appendectomy. Vascular/Lymphatic: There are no enlarged abdominal or pelvic lymph nodes. No significant gross vascular findings are present. Reproductive: The uterus and adnexa are unremarkable. Other: No evidence of abdominal wall mass or hernia. Musculoskeletal: No acute or significant osseous findings. IMPRESSION: Mild right-sided perinephric and periureteral stranding to the UVJ. This could be due to recently passed stone or mild pyelonephritis/ureteritis. Nonobstructing right renal calculi. Electronically Signed   By: Prudencio Pair M.D.   On: 02/26/2020 05:30    ____________________________________________   PROCEDURES  Procedure(s) performed (including Critical Care):  Procedures   ____________________________________________   INITIAL IMPRESSION / ASSESSMENT AND PLAN / ED COURSE  As part of my medical decision making, I reviewed the following data within the [facility] notes reviewed and incorporated, Labs reviewed, Old chart reviewed, Radiograph reviewed, Notes from prior ED visits and [place] Controlled Substance Database     Verlon Carcione was evaluated in Emergency Department on 02/26/2020 for the symptoms described in the history of present illness. She was evaluated in the context of  the global COVID-19 pandemic, which necessitated consideration that the patient might be at risk for infection with the SARS-CoV-2 virus that causes COVID-19. Institutional protocols and algorithms that pertain to the evaluation of patients at risk for COVID-19 are in a state of rapid change based on information released by regulatory bodies including the CDC and federal and state organizations. These policies and algorithms were followed during the patient's care in the ED.    49 year old female presenting with right lower quadrant/pelvic pain. Differential diagnosis includes, but is not limited to, ovarian cyst, ovarian torsion, acute appendicitis, diverticulitis, urinary tract infection/pyelonephritis, endometriosis, bowel obstruction, colitis, renal colic, gastroenteritis, hernia, fibroids, endometriosis, pregnancy related pain including ectopic pregnancy, etc.  Laboratory and urinalysis results unremarkable except for 21-50 RBC in urine, mild AKI.  Will initiate IV fluid hydration, IV morphine for pain paired with IV Zofran for nausea.  Will obtain CT renal stone study.   Clinical Course as of Feb 26 643  Mon Feb 26, 2020  0529 Updated her on CT imaging results.  Will start antibiotics;  patient has allergies to Keflex and Cipro; will start doxycycline.  Will discharge home on doxycycline, Percocet and Zofran to use as needed.  Strict return precautions given.  Patient verbalizes understanding agrees with plan of care.   [JS]    Clinical Course User Index [JS] Paulette Blanch, MD     ____________________________________________   FINAL CLINICAL IMPRESSION(S) / ED DIAGNOSES  Final diagnoses:  Right lower quadrant abdominal pain  Pyelonephritis     ED Discharge Orders         Ordered    doxycycline (VIBRAMYCIN) 50 MG capsule  2 times daily     Discontinue  Reprint     02/26/20 0632    oxyCODONE-acetaminophen (PERCOCET/ROXICET) 5-325 MG tablet  Every 4 hours PRN     Discontinue  Reprint     02/26/20 0632    ondansetron (ZOFRAN ODT) 4 MG disintegrating tablet  Every 8 hours PRN     Discontinue  Reprint     02/26/20 6295           Note:  This document was prepared using Dragon voice recognition software and may include unintentional dictation errors.   Paulette Blanch, MD 02/26/20 [phone]

## 2020-02-26 NOTE — ED Notes (Signed)
Pt updated on wait, pt verbalizes understanding.  °

## 2020-02-26 NOTE — Discharge Instructions (Signed)
1.  Take antibiotic as prescribed (Doxycycline 100 mg twice daily x7 days). 2.  You may take medicines as needed for pain and nausea (Percocet/Zofran #20). 3.  Drink plenty of fluids daily. 4.  Return to the ER for worsening symptoms, persistent vomiting, fever, difficulty breathing or or other concerns.

## 2020-02-27 LAB — URINE CULTURE: Culture: 10000 — AB

## 2020-03-06 ENCOUNTER — Encounter: Payer: Self-pay | Admitting: Internal Medicine

## 2020-03-21 ENCOUNTER — Other Ambulatory Visit: Payer: Self-pay | Admitting: Internal Medicine

## 2020-03-31 ENCOUNTER — Other Ambulatory Visit: Payer: Self-pay | Admitting: Internal Medicine

## 2020-04-05 ENCOUNTER — Other Ambulatory Visit: Payer: Self-pay | Admitting: Internal Medicine

## 2020-04-21 ENCOUNTER — Other Ambulatory Visit: Payer: Self-pay | Admitting: Internal Medicine

## 2020-04-24 ENCOUNTER — Other Ambulatory Visit: Payer: Self-pay | Admitting: Internal Medicine

## 2020-04-25 ENCOUNTER — Other Ambulatory Visit: Payer: Self-pay

## 2020-04-25 ENCOUNTER — Encounter: Payer: Self-pay | Admitting: Internal Medicine

## 2020-04-25 ENCOUNTER — Ambulatory Visit: Payer: BC Managed Care – PPO | Admitting: Internal Medicine

## 2020-04-25 VITALS — BP 140/90 | HR 78 | Ht 65.0 in | Wt 217.0 lb

## 2020-04-25 DIAGNOSIS — D649 Anemia, unspecified: Secondary | ICD-10-CM

## 2020-04-25 DIAGNOSIS — I491 Atrial premature depolarization: Secondary | ICD-10-CM | POA: Diagnosis not present

## 2020-04-25 DIAGNOSIS — I10 Essential (primary) hypertension: Secondary | ICD-10-CM

## 2020-04-25 DIAGNOSIS — I472 Ventricular tachycardia, unspecified: Secondary | ICD-10-CM

## 2020-04-25 NOTE — Patient Instructions (Addendum)
Medication Instructions:  - Your physician recommends that you continue on your current medications as directed. Please refer to the Current Medication list given to you today.  *If you need a refill on your cardiac medications before your next appointment, please call your pharmacy*   Lab Work: - Your physician recommends that you have lab work today: CBC/ Iron-Ferritin-TIBC panel  If you have labs (blood work) drawn today and your tests are completely normal, you will receive your results only by: [name] MyChart Message (if you have MyChart) OR . A paper copy in the mail If you have any lab test that is abnormal or we need to change your treatment, we will call you to review the results.   Testing/Procedures: - none ordered   Follow-Up: At [facility], you and your health needs are our priority.  As part of our continuing mission to provide you with exceptional heart care, we have created designated Provider Care Teams.  These Care Teams include your primary Cardiologist (physician) and Advanced Practice Providers (APPs -  Physician Assistants and Nurse Practitioners) who all work together to provide you with the care you need, when you need it.  We recommend signing up for the patient portal called "MyChart".  Sign up information is provided on this After Visit Summary.  MyChart is used to connect with patients for Virtual Visits (Telemedicine).  Patients are able to view lab/test results, encounter notes, upcoming appointments, etc.  Non-urgent messages can be sent to your provider as well.   To learn more about what you can do with MyChart, go to [email or web address].    Your next appointment:   6 month(s)  The format for your next appointment:   In Person  Provider:   Virl Axe, MD   Other Instructions n/a

## 2020-04-25 NOTE — Progress Notes (Signed)
Patient Care Team: Jearld Fenton, NP as PCP - General (Internal Medicine) Minna Merritts, MD as PCP - Cardiology (Cardiology)   HPI  [name] is a 49 y.o. female Seen in follow-up for palpitations attributed to PACs.  She also has nonsustained ventricular tachycardia.  Cardiac evaluation has included an echocardiogram that was normal.  cMRI and signal-averaged ECG are pending.  Not been benefited by beta-blocker; at initial evaluation we introduced verapamil.  Significant improvement in palpitations; however, profound persisting fatigue.  Initiated flecainide.  Significant improvement.  Fleeting palpitations.  The patient denies chest pain, shortness of breath, nocturnal dyspnea, orthopnea or peripheral edema.  There have been no lightheadedness or syncope.    Not vaccinated  DATE TEST EF   10/20 Echo   60-65 %   4/21 cMRI 53% LGE neg        Date Cr K Hgb TSH  4/20 1.04 4.2  5.73 (7/20)  11/20      0.65  6/21 1.10 3.7 10.3 0.73   DATE PR interval QRSduration Dose  4/21  126 90 Flec 0   8/21 132 82 50      Records and Results Reviewed   Past Medical History:  Diagnosis Date  . Allergy   . Anxiety   . Frequent headaches   . Hypothyroidism     Past Surgical History:  Procedure Laterality Date  . APPENDECTOMY  2013  . lapband  2013  . TONSILLECTOMY  2012    Current Meds  Medication Sig  . ALPRAZolam (XANAX) 0.25 MG tablet Take 1 tablet (0.25 mg total) by mouth 2 (two) times daily as needed.  . AMETHIA 0.15-0.03 &0.01 MG tablet Take 1 tablet by mouth daily.  . butalbital-acetaminophen-caffeine (FIORICET) 50-325-40 MG tablet Take 1 tablet by mouth every 6 (six) hours as needed for headache.  . cyclobenzaprine (FLEXERIL) 10 MG tablet TAKE 1 TABLET BY MOUTH THREE TIMES A DAY AS NEEDED FOR MUSCLE SPASMS  . diltiazem (CARDIZEM CD) 120 MG 24 hr capsule TAKE 1 CAPSULE BY MOUTH EVERY DAY AT BEDTIME  . flecainide (TAMBOCOR) 50 MG tablet  Take 1 tablet (50 mg total) by mouth 2 (two) times daily.  . fluticasone (FLONASE) 50 MCG/ACT nasal spray SPRAY 2 SPRAYS INTO EACH NOSTRIL EVERY DAY  . gabapentin (NEURONTIN) 100 MG capsule Take 1 capsule (100 mg total) by mouth 3 (three) times daily.  [name] levocetirizine (XYZAL) 5 MG tablet TAKE 1 TABLET BY MOUTH EVERY DAY IN THE EVENING  . levothyroxine (SYNTHROID) 112 MCG tablet TAKE 1 TABLET (112 MCG TOTAL) BY MOUTH DAILY BEFORE BREAKFAST.  [name] losartan (COZAAR) 50 MG tablet Take 1 tablet (50 mg total) by mouth daily.  . ondansetron (ZOFRAN ODT) 4 MG disintegrating tablet Take 1 tablet (4 mg total) by mouth every 8 (eight) hours as needed for nausea or vomiting.  . pravastatin (PRAVACHOL) 40 MG tablet TAKE 1 TABLET BY MOUTH EVERY DAY  . Prenatal Vit-Fe Fumarate-FA (PRENATAL MULTIVITAMIN) TABS tablet Take 1 tablet by mouth daily at 12 noon.  . rizatriptan (MAXALT-MLT) 10 MG disintegrating tablet TAKE 1 TABLET BY MOUTH AS NEEDED FOR MIGRAINE. MAY REPEAT IN 2 HOURS IF NEEDED  . sertraline (ZOLOFT) 100 MG tablet TAKE 1 TABLET BY MOUTH EVERY DAY  . SUMAtriptan (IMITREX) 25 MG tablet Take 1 tablet (25 mg total) by mouth every 2 (two) hours as needed for migraine. May repeat in 2 hours if headache persists or recurs.  . valACYclovir (VALTREX)  500 MG tablet Take 500 mg by mouth daily.    Allergies  Allergen Reactions  . Ciprofloxacin     anxiety  . Keflex [Cephalexin] Hives  . Trazodone And Nefazodone Other (See Comments)    nightmares      Review of Systems negative except from HPI and PMH  Physical Exam BP 140/90 (BP Location: Left Arm, Patient Position: Sitting, Cuff Size: Normal)   Pulse 78   Ht 5\' 5"  (1.651 m)   Wt 217 lb (98.4 kg)   SpO2 98%   BMI 36.11 kg/m  Well developed and nourished in no acute distress HENT normal Neck supple with JVP-  flat   Clear Regular rate and rhythm, no murmurs or gallops Abd-soft with active BS No Clubbing cyanosis edema Skin-warm and dry A &  Oriented  Grossly normal sensory and motor function  ECG sinus at 78 Intervals 13/08/37   Assessment and  Plan  PACs-frequent-symptomatic  VT-nonsustained  Blood pressure-elevated  Not vaccinated  Obesity  anemia  PACs much improved on flecainide.  We will continue.  cMRI was normal.  Encourage as relates nonsustained VT.  Blood pressures relatively stable.  Working on weight loss.  Noted that she was anemic.  We will check repeat hemoglobin and iron studies.  She is amenorrheic.  No changes in bowel patterns.  Discussed vaccination status.  Offered to answer questions.  Declined.     Current medicines are reviewed at length with the patient today .  The patient does not  have concerns regarding medicines.

## 2020-04-26 ENCOUNTER — Encounter: Payer: Self-pay | Admitting: Internal Medicine

## 2020-04-26 ENCOUNTER — Other Ambulatory Visit: Payer: Self-pay | Admitting: Internal Medicine

## 2020-04-26 LAB — IRON,TIBC AND FERRITIN PANEL
Ferritin: 5 ng/mL — ABNORMAL LOW (ref 15–150)
Iron Saturation: 6 % — CL (ref 15–55)
Iron: 32 ug/dL (ref 27–159)
Total Iron Binding Capacity: 514 ug/dL — ABNORMAL HIGH (ref 250–450)
UIBC: 482 ug/dL — ABNORMAL HIGH (ref 131–425)

## 2020-04-26 LAB — CBC WITH DIFFERENTIAL/PLATELET
Basophils Absolute: 0.1 10*3/uL (ref 0.0–0.2)
Basos: 1 %
EOS (ABSOLUTE): 0 10*3/uL (ref 0.0–0.4)
Eos: 1 %
Hematocrit: 31.6 % — ABNORMAL LOW (ref 34.0–46.6)
Hemoglobin: 10.1 g/dL — ABNORMAL LOW (ref 11.1–15.9)
Immature Grans (Abs): 0 10*3/uL (ref 0.0–0.1)
Immature Granulocytes: 0 %
Lymphocytes Absolute: 1.4 10*3/uL (ref 0.7–3.1)
Lymphs: 33 %
MCH: 25.7 pg — ABNORMAL LOW (ref 26.6–33.0)
MCHC: 32 g/dL (ref 31.5–35.7)
MCV: 80 fL (ref 79–97)
Monocytes Absolute: 0.3 10*3/uL (ref 0.1–0.9)
Monocytes: 6 %
Neutrophils Absolute: 2.6 10*3/uL (ref 1.4–7.0)
Neutrophils: 59 %
Platelets: 261 10*3/uL (ref 150–450)
RBC: 3.93 x10E6/uL (ref 3.77–5.28)
RDW: 16.8 % — ABNORMAL HIGH (ref 11.7–15.4)
WBC: 4.4 10*3/uL (ref 3.4–10.8)

## 2020-04-26 MED ORDER — SUMATRIPTAN SUCCINATE 25 MG PO TABS: 25.0000 mg | ORAL_TABLET | ORAL | 0 refills | Status: DC | PRN

## 2020-04-26 NOTE — Telephone Encounter (Addendum)
Flexeril last filled 06/2019... Imitrex last filled 08/15/2019... please advise if appropriate to refill

## 2020-05-06 ENCOUNTER — Ambulatory Visit: Payer: BC Managed Care – PPO | Attending: Internal Medicine

## 2020-05-06 DIAGNOSIS — Z23 Encounter for immunization: Secondary | ICD-10-CM

## 2020-05-06 NOTE — Progress Notes (Signed)
   Covid-19 Vaccination Clinic  Name:  [name]    MRN: [id number] DOB: Nov 15, 1970  05/06/2020  [name] was observed post Covid-19 immunization for 15 minutes without incident. She was provided with Vaccine Information Sheet and instruction to access the V-Safe system.   [name] was instructed to call 911 with any severe reactions post vaccine: [name] Difficulty breathing  . Swelling of face and throat  . A fast heartbeat  . A bad rash all over body  . Dizziness and weakness   Immunizations Administered    Name Date Dose VIS Date Route   Pfizer COVID-19 Vaccine 05/06/2020  3:57 PM 0.3 mL 11/01/2018 Intramuscular   Manufacturer: [place]   Lot: [id number]   [place]: [id number]

## 2020-05-07 ENCOUNTER — Telehealth: Payer: Self-pay | Admitting: *Deleted

## 2020-05-07 ENCOUNTER — Other Ambulatory Visit: Payer: Self-pay

## 2020-05-07 ENCOUNTER — Ambulatory Visit
Admission: EM | Admit: 2020-05-07 | Discharge: 2020-05-07 | Disposition: A | Discharge status: Home / Self Care | Payer: BC Managed Care – PPO | Attending: Emergency Medicine | Admitting: Emergency Medicine

## 2020-05-07 ENCOUNTER — Encounter: Payer: Self-pay | Admitting: Internal Medicine

## 2020-05-07 DIAGNOSIS — G510 Bell's palsy: Secondary | ICD-10-CM | POA: Diagnosis not present

## 2020-05-07 MED ORDER — PREDNISONE 10 MG PO TABS: 60.0000 mg | ORAL_TABLET | Freq: Every day | ORAL | 0 refills | Status: AC

## 2020-05-07 NOTE — ED Triage Notes (Signed)
Patient reports she got her first dose of the pfizer covid vaccine and woke up today with right sided facial pain and slight right eye facial droop.

## 2020-05-07 NOTE — Telephone Encounter (Signed)
Patient called to schedule an appointment and was transferred to triage because of symptoms. Patient stated that she took her first [place] covid vaccine yesterday around 4:00 pm. Patient stated last night around 9:00 pm she noticed that the right side of her mouth started to droop. Patient stated when she woke up this morning the right side of her cheek was drooping. Patient stated she is having some tingling and tenderness on her right cheek. Patient stated that she is pretty sure that she has Bell's  Palsey's.  Patient stated other than her face she is feeling a little tired. After speaking to Webb Silversmith NP patient was advised that she needs to be evaluated today. Patient was advised that she needs to go to an UC for evaluation. Patient was given the information on the [facility] in [place]. Patient stated that she is at work but will plan on going to the UC after she speaks to her employer.

## 2020-05-07 NOTE — ED Provider Notes (Signed)
[name]    CSN: [id number] Arrival date & time: 05/07/20  1011      History   Chief Complaint Chief Complaint  Patient presents with  . Facial Pain    HPI [name] is a 49 y.o. female.   Patient presents with right side facial droop since she woke up this morning.  She received the first dose of her [place] yesterday.  She denies numbness, weakness, paresthesias in her extremities.  She denies confusion, dizziness, headache, speech difficulty, chest pain, shortness of breath, abdominal pain, rash, or other symptoms.  No treatments attempted at home.  The history is provided by the patient.    Past Medical History:  Diagnosis Date  . Allergy   . Anxiety   . Frequent headaches   . Hypothyroidism     Patient Active Problem List   Diagnosis Date Noted  . Palpitations 08/19/2019  . HLD (hyperlipidemia) 08/19/2019  . Breast pain, left 08/19/2019  . 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 GAD (generalized anxiety disorder) 06/03/2018  . Chronic headaches 09/21/2013  . Hypothyroidism 09/21/2013    Past Surgical History:  Procedure Laterality Date  . APPENDECTOMY  2013  . lapband  2013  . TONSILLECTOMY  2012    OB History   No obstetric history on file.      Home Medications    Prior to Admission medications   Medication Sig Start Date End Date Taking? Authorizing Provider  ALPRAZolam (XANAX) 0.25 MG tablet Take 1 tablet (0.25 mg total) by mouth 2 (two) times daily as needed. 01/11/20   Jearld Fenton, NP  AMETHIA 0.15-0.03 &0.01 MG tablet Take 1 tablet by mouth daily. 11/05/14   Jearld Fenton, NP  butalbital-acetaminophen-caffeine (FIORICET) [phone] MG tablet Take 1 tablet by mouth every 6 (six) hours as needed for headache. 05/02/19   Jearld Fenton, NP  cyclobenzaprine (FLEXERIL) 10 MG tablet TAKE 1 TABLET BY MOUTH THREE TIMES A DAY AS NEEDED FOR MUSCLE SPASMS 04/26/20   Jearld Fenton, NP  diltiazem  (CARDIZEM CD) 120 MG 24 hr capsule TAKE 1 CAPSULE BY MOUTH EVERY DAY AT BEDTIME 04/01/20   Deboraha Sprang, MD  flecainide (TAMBOCOR) 50 MG tablet Take 1 tablet (50 mg total) by mouth 2 (two) times daily. 12/21/19   Deboraha Sprang, MD  fluticasone [facility]) 50 MCG/ACT nasal spray SPRAY 2 SPRAYS INTO EACH NOSTRIL EVERY DAY 03/21/20   Jearld Fenton, NP  gabapentin (NEURONTIN) 100 MG capsule TAKE 1 CAPSULE BY MOUTH THREE TIMES A DAY 04/26/20   Jearld Fenton, NP  levocetirizine (XYZAL) 5 MG tablet TAKE 1 TABLET BY MOUTH EVERY DAY IN THE EVENING 11/06/19   Jearld Fenton, NP  levothyroxine (SYNTHROID) 112 MCG tablet TAKE 1 TABLET (112 MCG TOTAL) BY MOUTH DAILY BEFORE BREAKFAST. 02/14/20   Jearld Fenton, NP  losartan (COZAAR) 50 MG tablet Take 1 tablet (50 mg total) by mouth daily. 01/26/20 04/25/20  Kate Sable, MD  ondansetron (ZOFRAN ODT) 4 MG disintegrating tablet Take 1 tablet (4 mg total) by mouth every 8 (eight) hours as needed for nausea or vomiting. 02/26/20   Paulette Blanch, MD  pravastatin (PRAVACHOL) 40 MG tablet TAKE 1 TABLET BY MOUTH EVERY DAY 04/05/20   Jearld Fenton, NP  predniSONE (DELTASONE) 10 MG tablet Take 6 tablets (60 mg total) by mouth daily with breakfast for 5 days. 05/07/20 05/12/20  Sharion Balloon, NP  Prenatal Vit-Fe  Fumarate-FA (PRENATAL MULTIVITAMIN) TABS tablet Take 1 tablet by mouth daily at 12 noon.    [provider]  rizatriptan (MAXALT-MLT) 10 MG disintegrating tablet TAKE 1 TABLET BY MOUTH AS NEEDED FOR MIGRAINE. MAY REPEAT IN 2 HOURS IF NEEDED 10/10/19   Jearld Fenton, NP  sertraline (ZOLOFT) 100 MG tablet TAKE 1 TABLET BY MOUTH EVERY DAY 04/22/20   Jearld Fenton, NP  SUMAtriptan (IMITREX) 25 MG tablet Take 1 tablet (25 mg total) by mouth every 2 (two) hours as needed for migraine. May repeat in 2 hours if headache persists or recurs. 04/26/20   Jearld Fenton, NP  valACYclovir (VALTREX) 500 MG tablet Take 500 mg by mouth daily. 05/06/18   [provider]    Family History Family History  Problem Relation Age of Onset  . Cancer Mother   . Depression Mother   . Breast cancer Mother   . Cancer Father   . Hyperlipidemia Father     Social History Social History   Tobacco Use  . Smoking status: Never Smoker  . Smokeless tobacco: Never Used  Substance Use Topics  . Alcohol use: Yes    Comment: occasional  . Drug use: No     Allergies   Ciprofloxacin, Keflex [cephalexin], and Trazodone and nefazodone   Review of Systems Review of Systems  Constitutional: Negative for chills and fever.  HENT: Negative for ear pain and sore throat.   Eyes: Negative for pain and visual disturbance.  Respiratory: Negative for cough and shortness of breath.   Cardiovascular: Negative for chest pain and palpitations.  Gastrointestinal: Negative for abdominal pain and vomiting.  Genitourinary: Negative for dysuria and hematuria.  Musculoskeletal: Negative for arthralgias and back pain.  Skin: Negative for color change and rash.  Neurological: Positive for facial asymmetry. Negative for dizziness, seizures, syncope, speech difficulty, weakness, numbness and headaches.  All other systems reviewed and are negative.    Physical Exam Triage Vital Signs ED Triage Vitals  Enc Vitals Group     BP      Pulse      Resp      Temp      Temp src      SpO2      Weight      Height      Head Circumference      Peak Flow      Pain Score      Pain Loc      Pain Edu?      Excl. in [place]?    No data found.  Updated Vital Signs BP (!) 150/93   Pulse 90   Temp 98.2 F (36.8 C)   Resp 16   LMP  (LMP Unknown)   SpO2 98%   Visual Acuity Right Eye Distance:   Left Eye Distance:   Bilateral Distance:    Right Eye Near:   Left Eye Near:    Bilateral Near:     Physical Exam Vitals and nursing note reviewed.  Constitutional:      General: She is not in acute distress.    Appearance: She is well-developed.  HENT:     Head: Normocephalic  and atraumatic.     Mouth/Throat:     Mouth: Mucous membranes are moist.  Eyes:     Conjunctiva/sclera: Conjunctivae normal.  Cardiovascular:     Rate and Rhythm: Normal rate and regular rhythm.     Heart sounds: No murmur heard.   Pulmonary:  Effort: Pulmonary effort is normal. No respiratory distress.     Breath sounds: Normal breath sounds.  Abdominal:     Palpations: Abdomen is soft.     Tenderness: There is no abdominal tenderness. There is no guarding or rebound.  Musculoskeletal:        General: Normal range of motion.     Cervical back: Neck supple.     Right lower leg: No edema.     Left lower leg: No edema.  Skin:    General: Skin is warm and dry.  Neurological:     General: No focal deficit present.     Mental Status: She is alert and oriented to person, place, and time.     Cranial Nerves: Cranial nerve deficit present.     Sensory: No sensory deficit.     Motor: No weakness.     Coordination: Romberg sign negative. Coordination normal.     Gait: Gait normal.     Comments: Slight right side facial weakness compared to left.  Able to fully close right eyelid. Speech clear. Upper and lower extremity strength 5/5; sensation intact.  Alert, oriented, clear thought process.   Psychiatric:        Mood and Affect: Mood normal.        Behavior: Behavior normal.      UC Treatments / Results  Labs (all labs ordered are listed, but only abnormal results are displayed) Labs Reviewed - No data to display  EKG   Radiology No results found.  Procedures Procedures (including critical care time)  Medications Ordered in UC Medications - No data to display  Initial Impression / Assessment and Plan / UC Course  I have reviewed the triage vital signs and the nursing notes.  Pertinent labs & imaging results that were available during my care of the patient were reviewed by me and considered in my medical decision making (see chart for details).   Bell's palsy.   Consult with Dr. Lanny Cramp: Treating with prednisone 60 mg daily x5 days.  Instructed patient to schedule follow-up with her PCP in 1 to 2 days for recheck.  Strict instructions for going to the emergency department discussed with patient.  Patient agrees to plan of care.   Final Clinical Impressions(s) / UC Diagnoses   Final diagnoses:  Bell's palsy     Discharge Instructions     Take the prednisone as directed.    Go to the emergency department if you have acute worsening symptoms.    Schedule a follow-up with your primary care provider in 1 to 2 days for recheck.        ED Prescriptions    Medication Sig Dispense Auth. Provider   predniSONE (DELTASONE) 10 MG tablet Take 6 tablets (60 mg total) by mouth daily with breakfast for 5 days. 30 tablet Sharion Balloon, NP     PDMP not reviewed this encounter.   Sharion Balloon, NP 05/07/20 1105

## 2020-05-07 NOTE — Telephone Encounter (Signed)
Agree with advice given

## 2020-05-07 NOTE — Discharge Instructions (Signed)
Take the prednisone as directed.    Go to the emergency department if you have acute worsening symptoms.    Schedule a follow-up with your primary care provider in 1 to 2 days for recheck.

## 2020-05-08 ENCOUNTER — Encounter: Payer: Self-pay | Admitting: Family Medicine

## 2020-05-08 ENCOUNTER — Ambulatory Visit: Payer: BC Managed Care – PPO | Admitting: Internal Medicine

## 2020-05-08 ENCOUNTER — Ambulatory Visit: Payer: BC Managed Care – PPO | Admitting: Family Medicine

## 2020-05-08 ENCOUNTER — Other Ambulatory Visit: Payer: Self-pay

## 2020-05-08 VITALS — BP 136/80 | HR 92 | Temp 97.9°F | Ht 65.0 in | Wt 219.5 lb

## 2020-05-08 DIAGNOSIS — G510 Bell's palsy: Secondary | ICD-10-CM | POA: Diagnosis not present

## 2020-05-08 NOTE — Progress Notes (Signed)
° °  Subjective:    Patient ID: [name], female    DOB: [birth year], 49 y.o.   MRN: [id number]  HPI Chief Complaint  Patient presents with   Follow-up    Dx bell's palsy on yesterday at [facility].    Facial Pain    Rightsid pain and tenderness.   This is a 49 yo female who presents today for above cc. She was seen yesterday following acute onset of facial weakness of right. Diagnosed with bell's palsy and started on prednisone 60 mg qd x 5 days. She took first dose this am. Concerned about decreased immune system related to being on prednisone. She is a Production assistant, radio.  No eye pain, some spasm, worse with getting tired, no drainage or dryness. Right cheek is sensitive, sore. No problems swallowing or with vision.  No recent uri symptoms. Had Covid vaccine earlier in day prior to symptoms starting.    Review of Systems Per HPI    Objective:   Physical Exam Vitals reviewed.  Constitutional:      General: She is not in acute distress.    Appearance: Normal appearance. She is obese. She is not ill-appearing, toxic-appearing or diaphoretic.  HENT:     Head: Normocephalic and atraumatic.     Nose: Nose normal.     Mouth/Throat:     Mouth: Mucous membranes are moist.  Eyes:     Conjunctiva/sclera: Conjunctivae normal.  Cardiovascular:     Rate and Rhythm: Normal rate.  Pulmonary:     Effort: Pulmonary effort is normal.  Skin:    General: Skin is warm and dry.  Neurological:     Mental Status: She is alert and oriented to person, place, and time.     Comments: Slight drooping of right eyelid, slightly asymmetrical smile.   Psychiatric:        Mood and Affect: Mood normal.        Behavior: Behavior normal.        Thought Content: Thought content normal.        Judgment: Judgment normal.      BP 136/80    Pulse 92    Temp 97.9 F (36.6 C) (Temporal)    Ht 5\' 5"  (1.651 m)    Wt 219 lb 8 oz (99.6 kg)    LMP  (LMP Unknown)    SpO2 98%    BMI 36.53 kg/m  Wt Readings from  Last 3 Encounters:  05/08/20 219 lb 8 oz (99.6 kg)  04/25/20 217 lb (98.4 kg)  02/25/20 216 lb (98 kg)        Assessment & Plan:  1. Bell's palsy - Provided written and verbal information regarding diagnosis and treatment. - finish prednisone - discussed expectations and follow up precautions   Clarene Reamer, FNP-BC  [facility], [place]  05/08/2020 3:04 PM

## 2020-05-08 NOTE — Patient Instructions (Signed)
Good to see you today   Bell Palsy, Adult  Bell palsy is a short-term inability to move muscles in part of the face. The inability to move (paralysis) results from inflammation or compression of the facial nerve, which travels along the skull and under the ear to the side of the face (7th cranial nerve). This nerve is responsible for facial movements that include blinking, closing the eyes, smiling, and frowning. What are the causes? The exact cause of this condition is not known. It may be caused by an infection from a virus, such as the chickenpox (herpes zoster), Epstein-Barr, or mumps virus. What increases the risk? You are more likely to develop this condition if:  You are pregnant.  You have diabetes.  You have had a recent infection in your nose, throat, or airways (upper respiratory infection).  You have a weakened body defense system (immune system).  You have had a facial injury, such as a fracture.  You have a family history of Bell palsy. What are the signs or symptoms? Symptoms of this condition include:  Weakness on one side of the face.  Drooping eyelid and corner of the mouth.  Excessive tearing in one eye.  Difficulty closing the eyelid.  Dry eye.  Drooling.  Dry mouth.  Changes in taste.  Change in facial appearance.  Pain behind one ear.  Ringing in one or both ears.  Sensitivity to sound in one ear.  Facial twitching.  Headache.  Impaired speech.  Dizziness.  Difficulty eating or drinking. Most of the time, only one side of the face is affected. Rarely, Bell palsy affects the whole face. How is this diagnosed? This condition is diagnosed based on:  Your symptoms.  Your medical history.  A physical exam. You may also have to see health care providers who specialize in disorders of the nerves (neurologist) or diseases and conditions of the eye (ophthalmologist). You may have tests, such as:  A test to check for nerve damage  (electromyogram).  Imaging studies, such as CT or MRI scans.  Blood tests. How is this treated? This condition affects every person differently. Sometimes symptoms go away without treatment within a couple weeks. If treatment is needed, it varies from person to person. The goal of treatment is to reduce inflammation and protect the eye from damage. Treatment for Bell palsy may include:  Medicines, such as: ? Steroids to reduce swelling and inflammation. ? Antiviral drugs. ? Pain relievers, including aspirin, acetaminophen, or ibuprofen.  Eye drops or ointment to keep your eye moist.  Eye protection, if you cannot close your eye.  Exercises or massage to regain muscle strength and function (physical therapy). Follow these instructions at home:   Take over-the-counter and prescription medicines only as told by your health care provider.  If your eye is affected: ? Keep your eye moist with eye drops or ointment as told by your health care provider. ? Follow instructions for eye care and protection as told by your health care provider.  Do any physical therapy exercises as told by your health care provider.  Keep all follow-up visits as told by your health care provider. This is important. Contact a health care provider if:  You have a fever.  Your symptoms do not get better within 2-3 weeks, or your symptoms get worse.  Your eye is red, irritated, or painful.  You have new symptoms. Get help right away if:  You have weakness or numbness in a part of your body  other than your face.  You have trouble swallowing.  You develop neck pain or stiffness.  You develop dizziness or shortness of breath. Summary  Bell palsy is a short-term inability to move muscles in part of the face. The inability to move (paralysis) results from inflammation or compression of the facial nerve.  This condition affects every person differently. Sometimes symptoms go away without treatment within  a couple weeks.  If treatment is needed, it varies from person to person. The goal of treatment is to reduce inflammation and protect the eye from damage.  Contact your health care provider if your symptoms do not get better within 2-3 weeks, or your symptoms get worse. This information is not intended to replace advice given to you by your health care provider. Make sure you discuss any questions you have with your health care provider. Document Revised: 08/06/2017 Document Reviewed: 10/27/2016 Elsevier Patient Education  2020 [employer].

## 2020-05-09 ENCOUNTER — Other Ambulatory Visit: Payer: Self-pay | Admitting: Internal Medicine

## 2020-05-20 ENCOUNTER — Other Ambulatory Visit: Payer: Self-pay

## 2020-05-20 ENCOUNTER — Encounter: Payer: Self-pay | Admitting: Internal Medicine

## 2020-05-20 ENCOUNTER — Ambulatory Visit (INDEPENDENT_AMBULATORY_CARE_PROVIDER_SITE_OTHER): Payer: BC Managed Care – PPO | Admitting: Internal Medicine

## 2020-05-20 VITALS — BP 126/92 | HR 78 | Temp 97.1°F | Wt 216.0 lb

## 2020-05-20 DIAGNOSIS — G510 Bell's palsy: Secondary | ICD-10-CM

## 2020-05-20 DIAGNOSIS — D509 Iron deficiency anemia, unspecified: Secondary | ICD-10-CM | POA: Diagnosis not present

## 2020-05-20 DIAGNOSIS — T50Z95D Adverse effect of other vaccines and biological substances, subsequent encounter: Secondary | ICD-10-CM

## 2020-05-20 NOTE — Progress Notes (Signed)
Subjective:    Patient ID: [name], female    DOB: 1970-09-13, 49 y.o.   MRN: [id number]  HPI  Pt presents to the clinic today to follow up labs. She was seen by her cardiologist, iron saturation and iron stores were low. H/H was 10.1 and 31.6. She is taking the oral iron. She denies constipation.  She would also like a referral for her colonoscopy and an upper endoscopy, this was recommended by her cardiologist for further evaluation of her anemia.  She was seen in the [facility] 8/31 for facial droop after getting her Covid vaccine. No labs or ECG were obtained. She was treated with Prednisone with almost complete resolution of symptoms. She does have some reservations about getting her second Covid vaccine.  Review of Systems      Past Medical History:  Diagnosis Date  . Allergy   . Anxiety   . Frequent headaches   . Hypothyroidism     Current Outpatient Medications  Medication Sig Dispense Refill  . ALPRAZolam (XANAX) 0.25 MG tablet Take 1 tablet (0.25 mg total) by mouth 2 (two) times daily as needed. 20 tablet 0  . AMETHIA 0.15-0.03 &0.01 MG tablet Take 1 tablet by mouth daily. 1 Package 0  . butalbital-acetaminophen-caffeine (FIORICET) 50-325-40 MG tablet Take 1 tablet by mouth every 6 (six) hours as needed for headache. 20 tablet 2  . cyclobenzaprine (FLEXERIL) 10 MG tablet TAKE 1 TABLET BY MOUTH THREE TIMES A DAY AS NEEDED FOR MUSCLE SPASMS 15 tablet 0  . diltiazem (CARDIZEM CD) 120 MG 24 hr capsule TAKE 1 CAPSULE BY MOUTH EVERY DAY AT BEDTIME 90 capsule 0  . flecainide (TAMBOCOR) 50 MG tablet Take 1 tablet (50 mg total) by mouth 2 (two) times daily. 60 tablet 6  . fluticasone (FLONASE) 50 MCG/ACT nasal spray SPRAY 2 SPRAYS INTO EACH NOSTRIL EVERY DAY 48 mL 0  . gabapentin (NEURONTIN) 100 MG capsule TAKE 1 CAPSULE BY MOUTH THREE TIMES A DAY 270 capsule 0  . levocetirizine (XYZAL) 5 MG tablet TAKE 1 TABLET BY MOUTH EVERY DAY IN THE EVENING 90 tablet 2  . levothyroxine  (SYNTHROID) 112 MCG tablet TAKE 1 TABLET (112 MCG TOTAL) BY MOUTH DAILY BEFORE BREAKFAST. 90 tablet 0  . losartan (COZAAR) 25 MG tablet Take 25 mg by mouth daily.    [name] losartan (COZAAR) 50 MG tablet Take 1 tablet (50 mg total) by mouth daily.    . ondansetron (ZOFRAN ODT) 4 MG disintegrating tablet Take 1 tablet (4 mg total) by mouth every 8 (eight) hours as needed for nausea or vomiting. 20 tablet 0  . pravastatin (PRAVACHOL) 40 MG tablet TAKE 1 TABLET BY MOUTH EVERY DAY 90 tablet 0  . Prenatal Vit-Fe Fumarate-FA (PRENATAL MULTIVITAMIN) TABS tablet Take 1 tablet by mouth daily at 12 noon.    . rizatriptan (MAXALT-MLT) 10 MG disintegrating tablet TAKE 1 TABLET BY MOUTH AS NEEDED FOR MIGRAINE. MAY REPEAT IN 2 HOURS IF NEEDED 10 tablet 0  . sertraline (ZOLOFT) 100 MG tablet TAKE 1 TABLET BY MOUTH EVERY DAY 90 tablet 0  . SUMAtriptan (IMITREX) 25 MG tablet Take 1 tablet (25 mg total) by mouth every 2 (two) hours as needed for migraine. May repeat in 2 hours if headache persists or recurs. 10 tablet 0  . valACYclovir (VALTREX) 500 MG tablet Take 500 mg by mouth daily.  3   No current facility-administered medications for this visit.    Allergies  Allergen Reactions  . Ciprofloxacin  anxiety  . Keflex [Cephalexin] Hives  . Trazodone And Nefazodone Other (See Comments)    nightmares    Family History  Problem Relation Age of Onset  . Cancer Mother   . Depression Mother   . Breast cancer Mother   . Cancer Father   . Hyperlipidemia Father     Social History   Socioeconomic History  . Marital status: Single    Spouse name: Not on file  . Number of children: Not on file  . Years of education: Not on file  . Highest education level: Not on file  Occupational History  . Not on file  Tobacco Use  . Smoking status: Never Smoker  . Smokeless tobacco: Never Used  Substance and Sexual Activity  . Alcohol use: Yes    Comment: occasional  . Drug use: No  . Sexual activity: Yes     Partners: Male  Other Topics Concern  . Not on file  Social History Narrative  . Not on file   Social Determinants of Health   Financial Resource Strain:   . Difficulty of Paying Living Expenses: Not on file  Food Insecurity:   . Worried About Charity fundraiser in the Last Year: Not on file  . Ran Out of Food in the Last Year: Not on file  Transportation Needs:   . Lack of Transportation (Medical): Not on file  . Lack of Transportation (Non-Medical): Not on file  Physical Activity:   . Days of Exercise per Week: Not on file  . Minutes of Exercise per Session: Not on file  Stress:   . Feeling of Stress : Not on file  Social Connections:   . Frequency of Communication with Friends and Family: Not on file  . Frequency of Social Gatherings with Friends and Family: Not on file  . Attends Religious Services: Not on file  . Active Member of Clubs or Organizations: Not on file  . Attends Archivist Meetings: Not on file  . Marital Status: Not on file  Intimate Partner Violence:   . Fear of Current or Ex-Partner: Not on file  . Emotionally Abused: Not on file  . Physically Abused: Not on file  . Sexually Abused: Not on file     Constitutional: Denies fever, malaise, fatigue, headache or abrupt weight changes.  Respiratory: Denies difficulty breathing, shortness of breath, cough or sputum production.   Cardiovascular: Denies chest pain, chest tightness, palpitations or swelling in the hands or feet.  Gastrointestinal: Denies abdominal pain, bloating, constipation, diarrhea or blood in the stool.  Neurological: Pt reports slight facial droop at the right corner of her mouth. Denies dizziness, difficulty with memory, difficulty with speech or problems with balance and coordination.    No other specific complaints in a complete review of systems (except as listed in HPI above).  Objective:   Physical Exam  BP (!) 126/92   Pulse 78   Temp (!) 97.1 F (36.2 C)  (Temporal)   Wt 216 lb (98 kg)   LMP  (LMP Unknown)   SpO2 98%   BMI 35.94 kg/m   Wt Readings from Last 3 Encounters:  05/08/20 219 lb 8 oz (99.6 kg)  04/25/20 217 lb (98.4 kg)  02/25/20 216 lb (98 kg)    General: Appears her stated age, obese, in NAD. Cardiovascular: Normal rate Pulmonary/Chest: Normal effort Neurological: Alert and oriented. Very slight droop noted of the right side of face   BMET    Component  Value Date/Time   NA 139 02/25/2020 2231   NA 143 01/26/2020 1600   K 3.7 02/25/2020 2231   CL 109 02/25/2020 2231   CO2 23 02/25/2020 2231   GLUCOSE 139 (H) 02/25/2020 2231   BUN 16 02/25/2020 2231   BUN 11 01/26/2020 1600   CREATININE 1.10 (H) 02/25/2020 2231   CALCIUM 8.8 (L) 02/25/2020 2231   GFRNONAA 60 (L) 02/25/2020 2231   GFRAA >60 02/25/2020 2231    Lipid Panel     Component Value Date/Time   CHOL 184 12/21/2019 1204   TRIG 94.0 12/21/2019 1204   HDL 38.00 (L) 12/21/2019 1204   CHOLHDL 5 12/21/2019 1204   VLDL 18.8 12/21/2019 1204   LDLCALC 127 (H) 12/21/2019 1204    CBC    Component Value Date/Time   WBC 4.4 04/25/2020 1154   WBC 6.5 02/25/2020 2231   RBC 3.93 04/25/2020 1154   RBC 4.13 02/25/2020 2231   HGB 10.1 (L) 04/25/2020 1154   HCT 31.6 (L) 04/25/2020 1154   PLT 261 04/25/2020 1154   MCV 80 04/25/2020 1154   MCH 25.7 (L) 04/25/2020 1154   MCH 24.9 (L) 02/25/2020 2231   MCHC 32.0 04/25/2020 1154   MCHC 32.7 02/25/2020 2231   RDW 16.8 (H) 04/25/2020 1154   LYMPHSABS 1.4 04/25/2020 1154   EOSABS 0.0 04/25/2020 1154   BASOSABS 0.1 04/25/2020 1154    Hgb A1C Lab Results  Component Value Date   HGBA1C 5.7 12/21/2019           Assessment & Plan:   UC Follow Up for Bell's Palsy s/p Covid Vaccine:  Almost resolved Will monitor  Iron Deficiency Anemia:  Continue oral iron for now Referral to GI for upper GI and colon cancer screening  Return precautions discussed Webb Silversmith, NP This visit occurred during the  SARS-CoV-2 public health emergency.  Safety protocols were in place, including screening questions prior to the visit, additional usage of staff PPE, and extensive cleaning of exam room while observing appropriate contact time as indicated for disinfecting solutions.

## 2020-05-22 NOTE — Patient Instructions (Signed)

## 2020-05-27 ENCOUNTER — Encounter: Payer: Self-pay | Admitting: Internal Medicine

## 2020-05-27 ENCOUNTER — Ambulatory Visit: Payer: BC Managed Care – PPO | Attending: Internal Medicine

## 2020-05-27 DIAGNOSIS — Z23 Encounter for immunization: Secondary | ICD-10-CM

## 2020-05-27 NOTE — Progress Notes (Signed)
   Covid-19 Vaccination Clinic  Name:  [name]    MRN: [id number] DOB: 31-Oct-1970  05/27/2020  [name] was observed post Covid-19 immunization for 15 minutes without incident. She was provided with Vaccine Information Sheet and instruction to access the V-Safe system.   [name] was instructed to call 911 with any severe reactions post vaccine: [name] Difficulty breathing  . Swelling of face and throat  . A fast heartbeat  . A bad rash all over body  . Dizziness and weakness   Immunizations Administered    Name Date Dose VIS Date Route   Pfizer COVID-19 Vaccine 05/27/2020  2:38 PM 0.3 mL 11/01/2018 Intramuscular   Manufacturer: [place]   Lot: [id number]   [address]: [id number]

## 2020-06-10 ENCOUNTER — Other Ambulatory Visit: Payer: Self-pay | Admitting: Internal Medicine

## 2020-06-12 NOTE — Telephone Encounter (Signed)
This is a [place] pt 

## 2020-07-04 ENCOUNTER — Ambulatory Visit: Payer: BC Managed Care – PPO | Admitting: Internal Medicine

## 2020-07-07 ENCOUNTER — Other Ambulatory Visit: Payer: Self-pay | Admitting: Internal Medicine

## 2020-07-07 DIAGNOSIS — G8929 Other chronic pain: Secondary | ICD-10-CM

## 2020-07-07 DIAGNOSIS — R519 Headache, unspecified: Secondary | ICD-10-CM

## 2020-07-08 MED ORDER — PRAVASTATIN SODIUM 40 MG PO TABS: 40.0000 mg | ORAL_TABLET | Freq: Every day | ORAL | 0 refills | Status: DC

## 2020-07-08 NOTE — Telephone Encounter (Signed)
Imitrex last filled 04/26/2020 with no refills ... Fioricet last filled 04/2019.Marland KitchenMarland KitchenMarland Kitchen please advise

## 2020-07-12 ENCOUNTER — Other Ambulatory Visit: Payer: Self-pay | Admitting: Internal Medicine

## 2020-07-14 ENCOUNTER — Encounter: Payer: Self-pay | Admitting: Internal Medicine

## 2020-07-15 ENCOUNTER — Encounter: Payer: Self-pay | Admitting: Internal Medicine

## 2020-07-16 MED ORDER — ALPRAZOLAM 0.25 MG PO TABS: 0.2500 mg | ORAL_TABLET | Freq: Two times a day (BID) | ORAL | 0 refills | Status: DC | PRN

## 2020-07-17 ENCOUNTER — Other Ambulatory Visit: Payer: Self-pay | Admitting: Internal Medicine

## 2020-07-24 ENCOUNTER — Encounter: Payer: Self-pay | Admitting: Internal Medicine

## 2020-08-05 ENCOUNTER — Other Ambulatory Visit: Payer: Self-pay | Admitting: Internal Medicine

## 2020-08-19 ENCOUNTER — Other Ambulatory Visit: Payer: Self-pay | Admitting: Internal Medicine

## 2020-08-20 MED ORDER — CYCLOBENZAPRINE HCL 10 MG PO TABS: 10.0000 mg | ORAL_TABLET | Freq: Every day | ORAL | 0 refills | Status: DC | PRN

## 2020-08-20 MED ORDER — GABAPENTIN 100 MG PO CAPS: 100.0000 mg | ORAL_CAPSULE | Freq: Three times a day (TID) | ORAL | 0 refills | Status: DC

## 2020-08-20 NOTE — Telephone Encounter (Signed)
Pharmacy requests refill on: Gabapentin 100 mg & Cyclobenzaprine 10 mg   LAST REFILL: 04/26/2020 LAST OV: 05/20/2020 NEXT OV: Not Scheduled  PHARMACY: [facility] [place], [place]

## 2020-09-11 ENCOUNTER — Other Ambulatory Visit: Payer: Self-pay | Admitting: Obstetrics and Gynecology

## 2020-09-11 DIAGNOSIS — N6489 Other specified disorders of breast: Secondary | ICD-10-CM

## 2020-09-23 ENCOUNTER — Ambulatory Visit: Payer: BC Managed Care – PPO | Admitting: Internal Medicine

## 2020-10-03 ENCOUNTER — Other Ambulatory Visit: Payer: Self-pay | Admitting: Internal Medicine

## 2020-10-08 HISTORY — PX: BREAST BIOPSY: SHX20

## 2020-10-27 ENCOUNTER — Encounter: Payer: Self-pay | Admitting: Internal Medicine

## 2020-10-28 ENCOUNTER — Ambulatory Visit
Admission: RE | Admit: 2020-10-28 | Discharge: 2020-10-28 | Disposition: A | Discharge status: Home / Self Care | Payer: BC Managed Care – PPO | Source: Ambulatory Visit | Attending: Obstetrics and Gynecology | Admitting: Obstetrics and Gynecology

## 2020-10-28 ENCOUNTER — Other Ambulatory Visit: Payer: Self-pay | Admitting: Obstetrics and Gynecology

## 2020-10-28 ENCOUNTER — Other Ambulatory Visit: Payer: Self-pay

## 2020-10-28 DIAGNOSIS — N6489 Other specified disorders of breast: Secondary | ICD-10-CM

## 2020-10-29 ENCOUNTER — Other Ambulatory Visit: Payer: Self-pay | Admitting: Internal Medicine

## 2020-10-30 ENCOUNTER — Other Ambulatory Visit: Payer: Self-pay

## 2020-10-30 ENCOUNTER — Telehealth: Payer: BC Managed Care – PPO | Admitting: Internal Medicine

## 2020-10-31 ENCOUNTER — Other Ambulatory Visit: Payer: Self-pay

## 2020-10-31 ENCOUNTER — Ambulatory Visit: Payer: BC Managed Care – PPO | Admitting: Internal Medicine

## 2020-10-31 ENCOUNTER — Ambulatory Visit
Admission: RE | Admit: 2020-10-31 | Discharge: 2020-10-31 | Disposition: A | Discharge status: Home / Self Care | Payer: BC Managed Care – PPO | Source: Ambulatory Visit | Attending: Obstetrics and Gynecology | Admitting: Obstetrics and Gynecology

## 2020-10-31 ENCOUNTER — Encounter: Payer: Self-pay | Admitting: Internal Medicine

## 2020-10-31 VITALS — BP 130/84 | HR 80 | Ht 65.0 in | Wt 216.4 lb

## 2020-10-31 DIAGNOSIS — I491 Atrial premature depolarization: Secondary | ICD-10-CM

## 2020-10-31 DIAGNOSIS — N6489 Other specified disorders of breast: Secondary | ICD-10-CM

## 2020-10-31 DIAGNOSIS — I472 Ventricular tachycardia, unspecified: Secondary | ICD-10-CM

## 2020-10-31 DIAGNOSIS — I10 Essential (primary) hypertension: Secondary | ICD-10-CM

## 2020-10-31 NOTE — Patient Instructions (Signed)

## 2020-10-31 NOTE — Progress Notes (Signed)
Patient Care Team: Jearld Fenton, NP as PCP - General (Internal Medicine) Minna Merritts, MD as PCP - Cardiology (Cardiology)   HPI  [name] is a 50 y.o. female Seen in follow-up for palpitations attributed to PACs.  She also has nonsustained ventricular tachycardia.  Cardiac evaluation has included an echocardiogram that was normal; signal-averaged ECG are pending.  Not been benefited by beta-blocker; at initial evaluation we introduced verapamil.  Significant improvement in palpitations; however, profound persisting fatigue.  Initiated flecainide.  Significant improvement.  Fleeting palpitations.  The patient denies chest pain, shortness of breath, nocturnal dyspnea, orthopnea or peripheral edema.  There have been no lightheadedness or syncope.  Scant ectopics and  brief (<10sec) flutters    Tolerating the flecainide  Trying to lose weight   S/p LAPBAND about 10+ yrs ago; walking > 15k steps a day  Does not sleep well; no hx of sleep study     DATE TEST EF   10/20 Echo   60-65 %   4/21 cMRI 53% LGE neg        Date Cr K Hgb TSH  4/20 1.04 4.2  5.73 (7/20)  11/20      0.65  6/21 1.10 3.7 10.3 0.73   DATE PR interval QRSduration Dose  4/21  126 90 Flec 0   8/21 132 82 50  2/22 114 80 50      Records and Results Reviewed   Past Medical History:  Diagnosis Date  . Allergy   . Anxiety   . Frequent headaches   . Hypothyroidism     Past Surgical History:  Procedure Laterality Date  . APPENDECTOMY  2013  . lapband  2013  . TONSILLECTOMY  2012    Current Meds  Medication Sig  . ALPRAZolam (XANAX) 0.25 MG tablet Take 1 tablet (0.25 mg total) by mouth 2 (two) times daily as needed.  . AMETHIA 0.15-0.03 &0.01 MG tablet Take 1 tablet by mouth daily.  . butalbital-acetaminophen-caffeine (FIORICET) 50-325-40 MG tablet TAKE 1 TABLET BY MOUTH EVERY 6 (SIX) HOURS AS NEEDED FOR HEADACHE.  . cyclobenzaprine (FLEXERIL) 10 MG tablet Take 1  tablet (10 mg total) by mouth daily as needed for muscle spasms.  . flecainide (TAMBOCOR) 50 MG tablet TAKE 1 TABLET BY MOUTH TWICE A DAY  . fluticasone (FLONASE) 50 MCG/ACT nasal spray SPRAY 2 SPRAYS INTO EACH NOSTRIL EVERY DAY  . gabapentin (NEURONTIN) 100 MG capsule Take 1 capsule (100 mg total) by mouth 3 (three) times daily.  [name] levocetirizine (XYZAL) 5 MG tablet TAKE 1 TABLET BY MOUTH EVERY DAY IN THE EVENING  . levothyroxine (SYNTHROID) 112 MCG tablet TAKE 1 TABLET (112 MCG TOTAL) BY MOUTH DAILY BEFORE BREAKFAST.  [name] ondansetron (ZOFRAN ODT) 4 MG disintegrating tablet Take 1 tablet (4 mg total) by mouth every 8 (eight) hours as needed for nausea or vomiting.  . pravastatin (PRAVACHOL) 40 MG tablet Take 1 tablet (40 mg total) by mouth daily.  . Prenatal Vit-Fe Fumarate-FA (PRENATAL MULTIVITAMIN) TABS tablet Take 1 tablet by mouth daily at 12 noon.  . rizatriptan (MAXALT-MLT) 10 MG disintegrating tablet TAKE 1 TABLET BY MOUTH AS NEEDED FOR MIGRAINE. MAY REPEAT IN 2 HOURS IF NEEDED  . sertraline (ZOLOFT) 100 MG tablet TAKE 1 TABLET BY MOUTH EVERY DAY  . SUMAtriptan (IMITREX) 25 MG tablet TAKE 1 TABLET (25 MG TOTAL) BY MOUTH EVERY 2 (TWO) HOURS AS NEEDED FOR MIGRAINE. MAY REPEAT IN 2 HOURS IF HEADACHE PERSISTS  OR RECURS.  . valACYclovir (VALTREX) 500 MG tablet Take 500 mg by mouth daily.    Allergies  Allergen Reactions  . Ciprofloxacin     anxiety  . Keflex [Cephalexin] Hives  . Trazodone And Nefazodone Other (See Comments)    nightmares      Review of Systems negative except from HPI and PMH  Physical Exam BP 130/84   Pulse 80   Ht 5\' 5"  (1.651 m)   Wt 216 lb 6.4 oz (98.2 kg)   SpO2 98%   BMI 36.01 kg/m  Well developed and nourished in no acute distress HENT normal Neck supple  Clear Regular rate and rhythm, no murmurs or gallops Abd-soft with active BS No Clubbing cyanosis edema Skin-warm and dry A & Oriented  Grossly normal sensory and motor function  ECG  Sinus !@  71 08/15/39  Assessment and  Plan  PACs-frequent-symptomatic  VT-nonsustained  Blood pressure-elevated  Not vaccinated  Obesity  Ectopy much improved on flecainide-- continue.  ECG without conduction prolongation  BP remains elevated--encouraged weight loss.  Suspect she has sleep apnea, which may also benefit from weight loss. Discussed ketogenic diets in conjunction w her pretty good exercise     Current medicines are reviewed at length with the patient today .  The patient does not  have concerns regarding medicines.

## 2020-11-01 ENCOUNTER — Other Ambulatory Visit: Payer: Self-pay | Admitting: Obstetrics and Gynecology

## 2020-11-01 DIAGNOSIS — N6459 Other signs and symptoms in breast: Secondary | ICD-10-CM

## 2020-11-01 DIAGNOSIS — R5381 Other malaise: Secondary | ICD-10-CM

## 2020-11-11 ENCOUNTER — Other Ambulatory Visit: Payer: Self-pay | Admitting: Internal Medicine

## 2020-11-13 ENCOUNTER — Other Ambulatory Visit: Payer: Self-pay | Admitting: Internal Medicine

## 2020-11-13 MED ORDER — RIZATRIPTAN BENZOATE 10 MG PO TBDP: 10.0000 mg | ORAL_TABLET | ORAL | 1 refills | Status: DC | PRN

## 2020-11-25 ENCOUNTER — Ambulatory Visit: Payer: Self-pay | Admitting: Surgery

## 2020-11-25 DIAGNOSIS — N6320 Unspecified lump in the left breast, unspecified quadrant: Secondary | ICD-10-CM

## 2020-11-25 NOTE — H&P (Signed)
[name] Appointment: 11/25/2020 10:10 AM Location: [facility] Patient #: [id number] DOB: 07-28-1971 Single / Language: Cleophus Molt / Race: White Female  History of Present Illness Marcello Moores A. Kortnie Stovall MD; 11/25/2020 12:41 PM) Patient words: Patient sent at the request of the [facility] after core biopsy of left breast mass showed a discordant finding. Her mother has breast cancer in her 86s and is now dealing with recurrent breast cancer. The patient states she had genetic testing which was negative recently. I do not have details of that currently. The patient denies any breast mass, pain or discharge prior to the biopsy.     CLINICAL DATA: 50 year old female with a suspicious 1.5 cm mass in the left breast at 2 o'clock.  EXAM: ULTRASOUND GUIDED LEFT BREAST CORE NEEDLE BIOPSY  COMPARISON: Previous exam(s).  PROCEDURE: I met with the patient and we discussed the procedure of ultrasound-guided biopsy, including benefits and alternatives. We discussed the high likelihood of a successful procedure. We discussed the risks of the procedure, including infection, bleeding, tissue injury, clip migration, and inadequate sampling. Informed written consent was given. The usual time-out protocol was performed immediately prior to the procedure.  Lesion quadrant: Upper-outer  Using sterile technique and 1% Lidocaine as local anesthetic, under direct ultrasound visualization, a 14 gauge spring-loaded device was used to perform biopsy of the mass in the left breast at the 2 o'clock position using a lateral to medial approach. At the conclusion of the procedure a ribbon shaped tissue marker clip was deployed into the biopsy cavity. Follow up 2 view mammogram was performed and dictated separately.  IMPRESSION: Ultrasound guided biopsy of the mass in the left breast at the 2 o'clock position. No apparent complications.  Electronically  Signed: By: Everlean Alstrom M.D. On: 10/31/2020 14:42  CLINICAL DATA: Post ultrasound-guided biopsy of a mass in the left breast at 2 o'clock posterior depth.  EXAM: DIAGNOSTIC LEFT MAMMOGRAM POST ULTRASOUND BIOPSY  COMPARISON: Previous exams  FINDINGS: Mammographic images were obtained following ultrasound-guided biopsy of a mass in the left breast at 2 o'clock posterior depth. A ribbon shaped biopsy marking clip is present and felt to reside along the lateral margin of the biopsied mass in the left breast at the 2 o'clock position. The mass is not well visualized on the post biopsy mammograms.  IMPRESSION: Ribbon shaped biopsy marking clip felt to reside along the lateral margin of the biopsied mass in the left breast at the 2 o'clock position.  Final Assessment: Post Procedure Mammograms for Marker Placement   Electronically Signed By: Everlean Alstrom M.D. On: 10/31/2020 66:15     50 year old female presenting for short-term follow-up of a left breast biopsy demonstrating fibrocystic changes. Family history of breast cancer in the patient's mother.  EXAM: DIGITAL DIAGNOSTIC UNILATERAL LEFT MAMMOGRAM WITH TOMOSYNTHESIS AND CAD; ULTRASOUND LEFT BREAST LIMITED  TECHNIQUE: Left digital diagnostic mammography and breast tomosynthesis was performed. The images were evaluated with computer-aided detection.; Targeted ultrasound examination of the left breast was performed  COMPARISON: Previous exam(s).  ACR Breast Density Category b: There are scattered areas of fibroglandular density.  FINDINGS: Mammogram:  Full field and spot compression tomosynthesis views of the left breast performed. Just lateral to the coil shaped biopsy marking clip in the outer left breast there is an asymmetry with possible subtle distortion, best seen on the cc view. There are no additional findings elsewhere in the left breast.  On physical exam I do not feel a definite fixed  discrete mass in the upper outer quadrant of the left breast. Patient reports persistent mild tenderness at the biopsy site.  Ultrasound:  Targeted ultrasound performed in the left breast at 2 o'clock 7 cm from the nipple demonstrates an irregular hypoechoic mass measuring 1.1 x 0.5 x 1.5 cm. No internal vascularity. This likely corresponds to the mammographic finding.  Targeted ultrasound of the left axilla demonstrates normal lymph nodes.  IMPRESSION: Indeterminate left breast mass at 2 o'clock 7 cm from the nipple.  RECOMMENDATION: Ultrasound-guided core needle biopsy of the left breast mass at 2 o'clock. Recommend close attention on post procedure mammogram to ensure correlation between the ultrasound and mammographic findings.  I have discussed the findings and recommendations with the patient agrees to proceed with biopsy. She will be scheduled for the biopsy appointment prior to leaving our office.  BI-RADS CATEGORY 4: Suspicious.   Electronically Signed By: Audie Pinto M.D. On: 10/28/2020 15:37    Diagnosis Breast, left, needle core biopsy, 2 o'clock - FIBROADIPOSE TISSUE WITH MINIMAL BREAST PARENCHYMA DEMONSTRATING MILD FIBROCYSTIC CHANGE TO INCLUDE APOCRINE METAPLASIA. Microscopic Comment Results reported to [facility] on 11/01/2020. Anastasia.  The patient is a 50 year old female.   Allergies Janeann Forehand, CNA; 11/25/2020 10:15 AM) Cipro *FLUOROQUINOLONES* TraZODone & Diet Manage Prod *ANTIDEPRESSANTS* Keflex *CEPHALOSPORINS* Allergies Reconciled  Medication History Janeann Forehand, CNA; 11/25/2020 10:15 AM) oxyCODONE-Acetaminophen (5-325MG Tablet, Oral) Active. Butalbital-APAP-Caffeine (50-325-40MG Tablet, Oral) Active. ALPRAZolam (0.25MG Tablet, Oral) Active. Cyclobenzaprine HCl (10MG Tablet, Oral) Active. Denta 5000 Plus (1.1% Cream, Dental) Active. Doxycycline Hyclate (50MG Capsule, Oral)  Active. Flecainide Acetate (50MG Tablet, Oral) Active. Fluconazole (150MG Tablet, Oral) Active. Fluticasone Propionate (50MCG/ACT Suspension, Nasal) Active. Gabapentin (100MG Capsule, Oral) Active. Levocetirizine Dihydrochloride (5MG Tablet, Oral) Active. levoFLOXacin (500MG Tablet, Oral) Active. Levonorgest-Eth Estrad 91-Day (0.15-0.03MG Tablet, Oral) Active. Levothyroxine Sodium (112MCG Tablet, Oral) Active. Losartan Potassium (25MG Tablet, Oral) Active. metroNIDAZOLE (500MG Tablet, Oral) Active. Mupirocin (2% Ointment, External) Active. Ondansetron (4MG Tablet Disint, Oral) Active. Pravastatin Sodium (40MG Tablet, Oral) Active. predniSONE (10MG Tablet, Oral) Active. Rizatriptan Benzoate (10MG Tablet Disint, Oral) Active. Sertraline HCl (100MG Tablet, Oral) Active. SUMAtriptan Succinate (25MG Tablet, Oral) Active. Medications Reconciled  Social History Janeann Forehand, CNA; 11/25/2020 10:13 AM) Caffeine use Tea. Tobacco use Never smoker.  Pregnancy / Birth History Janeann Forehand, CNA; 11/25/2020 10:13 AM) Age at menarche 30 years. Contraceptive History Oral contraceptives. Gravida 2 Irregular periods  Other Problems Janeann Forehand, CNA; 11/25/2020 10:13 AM) High blood pressure Migraine Headache Thyroid Disease     Review of Systems [facility] CNA; 11/25/2020 10:13 AM) General Not Present- Appetite Loss, Chills, Fatigue, Fever, Night Sweats, Weight Gain and Weight Loss. Skin Not Present- Change in Wart/Mole, Dryness, Hives, Jaundice, New Lesions, Non-Healing Wounds, Rash and Ulcer. HEENT Not Present- Earache, Hearing Loss, Hoarseness, Nose Bleed, Oral Ulcers, Ringing in the Ears, Seasonal Allergies, Sinus Pain, Sore Throat, Visual Disturbances, Wears glasses/contact lenses and Yellow Eyes. Cardiovascular Present- Palpitations. Not Present- Chest Pain, Difficulty Breathing Lying Down, Leg Cramps, Rapid Heart Rate, Shortness of Breath and  Swelling of Extremities. Female Genitourinary Not Present- Frequency, Nocturia, Painful Urination, Pelvic Pain and Urgency. Neurological Present- Headaches. Not Present- Decreased Memory, Fainting, Numbness, Seizures, Tingling, Tremor, Trouble walking and Weakness.  Vitals (Donyelle Alston CNA; 11/25/2020 10:15 AM) 11/25/2020 10:15 AM Weight: 219.5 lb Height: 65in Body Surface Area: 2.06 m Body Mass Index: 36.53 kg/m  Temp.: 49F  Pulse: 100 (Regular)  P.OX: 99% (Room air) BP: 140/76(Sitting, Left Arm, Standard)  Physical Exam (Santo Zahradnik A. Bernadetta Roell MD; 11/25/2020 12:41 PM)  General Mental Status-Alert. General Appearance-Consistent with stated age. Hydration-Well hydrated. Voice-Normal.  Head and Neck Head-normocephalic, atraumatic with no lesions or palpable masses. Trachea-midline. Thyroid Gland Characteristics - normal size and consistency.  Breast Breast - Left-Symmetric, Non Tender, No Biopsy scars, no Dimpling - Left, No Inflammation, No Lumpectomy scars, No Mastectomy scars, No Peau d' Orange. Breast - Right-Symmetric, Non Tender, No Biopsy scars, no Dimpling - Right, No Inflammation, No Lumpectomy scars, No Mastectomy scars, No Peau d' Orange. Breast Lump-No Palpable Breast Mass.  Cardiovascular Cardiovascular examination reveals -normal heart sounds, regular rate and rhythm with no murmurs and normal pedal pulses bilaterally.  Neurologic Neurologic evaluation reveals -alert and oriented x 3 with no impairment of recent or remote memory. Mental Status-Normal.  Lymphatic Head & Neck  General Head & Neck Lymphatics: Bilateral - Description - Normal. Axillary  General Axillary Region: Bilateral - Description - Normal. Tenderness - Non Tender.    Assessment & Plan (Thaily Hackworth A. Soniya Ashraf MD; 11/25/2020 12:43 PM)  LEFT BREAST MASS (N63.20) Impression: Discussed discordant biopsy and rationale for left breast lumpectomy. The  patient is eager to Proceed with left breast seed localized lumpectomy. Risk of lumpectomy include bleeding, infection, seroma, more surgery, use of seed/wire, wound care, cosmetic deformity and the need for other treatments, death , blood clots, death. Pt agrees to proceed. Lifetime breast cancer risk is 22% making her high risk.  Current Plans Pt Education - [facility] Breast Biopsy HCI: discussed with patient and provided information. You are being scheduled for surgery- Our schedulers will call you.  You should hear from our office's scheduling department within 5 working days about the location, date, and time of surgery. We try to make accommodations for patient's preferences in scheduling surgery, but sometimes the OR schedule or the surgeon's schedule prevents [place] from making those accommodations.  If you have not heard from our office [phone]) in 5 working days, call the office and ask for your surgeon's nurse.  If you have other questions about your diagnosis, plan, or surgery, call the office and ask for your surgeon's nurse.   AT HIGH RISK FOR BREAST CANCER (Z91.89) Impression: Recommend magnetic resonance imaging yearly Discuss high risk state in medical surgical options of management. Her genetic testing is already been done and she states she is "negative"

## 2020-11-25 NOTE — H&P (View-Only) (Signed)
[name] Appointment: 11/25/2020 10:10 AM Location: [facility] Patient #: [id number] DOB: [birth year] Single / Language: Cleophus Molt / Race: White Female  History of Present Illness [name] A. Julie Obriant MD; 11/25/2020 12:41 PM) Patient words: Patient sent at the request of the [facility]/ D r Jarosz after core biopsy of left breast mass showed a discordant finding. Her mother has breast cancer in her 55s and is now dealing with recurrent breast cancer. The patient states she had genetic testing which was negative recently. I do not have details of that currently. The patient denies any breast mass, pain or discharge prior to the biopsy.     CLINICAL DATA: 50 year old female with a suspicious 1.5 cm mass in the left breast at 2 o'clock.  EXAM: ULTRASOUND GUIDED LEFT BREAST CORE NEEDLE BIOPSY  COMPARISON: Previous exam(s).  PROCEDURE: I met with the patient and we discussed the procedure of ultrasound-guided biopsy, including benefits and alternatives. We discussed the high likelihood of a successful procedure. We discussed the risks of the procedure, including infection, bleeding, tissue injury, clip migration, and inadequate sampling. Informed written consent was given. The usual time-out protocol was performed immediately prior to the procedure.  Lesion quadrant: Upper-outer  Using sterile technique and 1% Lidocaine as local anesthetic, under direct ultrasound visualization, a 14 gauge spring-loaded device was used to perform biopsy of the mass in the left breast at the 2 o'clock position using a lateral to medial approach. At the conclusion of the procedure a ribbon shaped tissue marker clip was deployed into the biopsy cavity. Follow up 2 view mammogram was performed and dictated separately.  IMPRESSION: Ultrasound guided biopsy of the mass in the left breast at the 2 o'clock position. No apparent complications.  Electronically  Signed: By: Everlean Alstrom M.D. On: 10/31/2020 14:42  CLINICAL DATA: Post ultrasound-guided biopsy of a mass in the left breast at 2 o'clock posterior depth.  EXAM: DIAGNOSTIC LEFT MAMMOGRAM POST ULTRASOUND BIOPSY  COMPARISON: Previous exams  FINDINGS: Mammographic images were obtained following ultrasound-guided biopsy of a mass in the left breast at 2 o'clock posterior depth. A ribbon shaped biopsy marking clip is present and felt to reside along the lateral margin of the biopsied mass in the left breast at the 2 o'clock position. The mass is not well visualized on the post biopsy mammograms.  IMPRESSION: Ribbon shaped biopsy marking clip felt to reside along the lateral margin of the biopsied mass in the left breast at the 2 o'clock position.  Final Assessment: Post Procedure Mammograms for Marker Placement   Electronically Signed By: Everlean Alstrom M.D. On: 10/31/2020 3:38     50 year old female presenting for short-term follow-up of a left breast biopsy demonstrating fibrocystic changes. Family history of breast cancer in the patient's mother.  EXAM: DIGITAL DIAGNOSTIC UNILATERAL LEFT MAMMOGRAM WITH TOMOSYNTHESIS AND CAD; ULTRASOUND LEFT BREAST LIMITED  TECHNIQUE: Left digital diagnostic mammography and breast tomosynthesis was performed. The images were evaluated with computer-aided detection.; Targeted ultrasound examination of the left breast was performed  COMPARISON: Previous exam(s).  ACR Breast Density Category b: There are scattered areas of fibroglandular density.  FINDINGS: Mammogram:  Full field and spot compression tomosynthesis views of the left breast performed. Just lateral to the coil shaped biopsy marking clip in the outer left breast there is an asymmetry with possible subtle distortion, best seen on the cc view. There are no additional findings elsewhere in the left breast.  On physical exam I do not feel a definite fixed  discrete mass in the upper outer quadrant of the left breast. Patient reports persistent mild tenderness at the biopsy site.  Ultrasound:  Targeted ultrasound performed in the left breast at 2 o'clock 7 cm from the nipple demonstrates an irregular hypoechoic mass measuring 1.1 x 0.5 x 1.5 cm. No internal vascularity. This likely corresponds to the mammographic finding.  Targeted ultrasound of the left axilla demonstrates normal lymph nodes.  IMPRESSION: Indeterminate left breast mass at 2 o'clock 7 cm from the nipple.  RECOMMENDATION: Ultrasound-guided core needle biopsy of the left breast mass at 2 o'clock. Recommend close attention on post procedure mammogram to ensure correlation between the ultrasound and mammographic findings.  I have discussed the findings and recommendations with the patient agrees to proceed with biopsy. She will be scheduled for the biopsy appointment prior to leaving our office.  BI-RADS CATEGORY 4: Suspicious.   Electronically Signed By: Audie Pinto M.D. On: 10/28/2020 15:37    Diagnosis Breast, left, needle core biopsy, 2 o'clock - FIBROADIPOSE TISSUE WITH MINIMAL BREAST PARENCHYMA DEMONSTRATING MILD FIBROCYSTIC CHANGE TO INCLUDE APOCRINE METAPLASIA. Microscopic Comment Results reported to [facility] on 11/01/2020. [name].  The patient is a 50 year old female.   Allergies Janeann Forehand, CNA; 11/25/2020 10:15 AM) Cipro *FLUOROQUINOLONES* TraZODone & Diet Manage Prod *ANTIDEPRESSANTS* Keflex *CEPHALOSPORINS* Allergies Reconciled  Medication History Janeann Forehand, CNA; 11/25/2020 10:15 AM) oxyCODONE-Acetaminophen (5-325MG Tablet, Oral) Active. Butalbital-APAP-Caffeine (50-325-40MG Tablet, Oral) Active. ALPRAZolam (0.25MG Tablet, Oral) Active. Cyclobenzaprine HCl (10MG Tablet, Oral) Active. Denta 5000 Plus (1.1% Cream, Dental) Active. Doxycycline Hyclate (50MG Capsule, Oral)  Active. Flecainide Acetate (50MG Tablet, Oral) Active. Fluconazole (150MG Tablet, Oral) Active. Fluticasone Propionate (50MCG/ACT Suspension, Nasal) Active. Gabapentin (100MG Capsule, Oral) Active. Levocetirizine Dihydrochloride (5MG Tablet, Oral) Active. levoFLOXacin (500MG Tablet, Oral) Active. Levonorgest-Eth Estrad 91-Day (0.15-0.03MG Tablet, Oral) Active. Levothyroxine Sodium (112MCG Tablet, Oral) Active. Losartan Potassium (25MG Tablet, Oral) Active. metroNIDAZOLE (500MG Tablet, Oral) Active. Mupirocin (2% Ointment, External) Active. Ondansetron (4MG Tablet Disint, Oral) Active. Pravastatin Sodium (40MG Tablet, Oral) Active. predniSONE (10MG Tablet, Oral) Active. Rizatriptan Benzoate (10MG Tablet Disint, Oral) Active. Sertraline HCl (100MG Tablet, Oral) Active. SUMAtriptan Succinate (25MG Tablet, Oral) Active. Medications Reconciled  Social History Janeann Forehand, CNA; 11/25/2020 10:13 AM) Caffeine use Tea. Tobacco use Never smoker.  Pregnancy / Birth History Janeann Forehand, CNA; 11/25/2020 10:13 AM) Age at menarche 30 years. Contraceptive History Oral contraceptives. Gravida 2 Irregular periods  Other Problems Janeann Forehand, CNA; 11/25/2020 10:13 AM) High blood pressure Migraine Headache Thyroid Disease     Review of [facility] CNA; 11/25/2020 10:13 AM) General Not Present- Appetite Loss, Chills, Fatigue, Fever, Night Sweats, Weight Gain and Weight Loss. Skin Not Present- Change in Wart/Mole, Dryness, Hives, Jaundice, New Lesions, Non-Healing Wounds, Rash and Ulcer. HEENT Not Present- Earache, Hearing Loss, Hoarseness, Nose Bleed, Oral Ulcers, Ringing in the Ears, Seasonal Allergies, Sinus Pain, Sore Throat, Visual Disturbances, Wears glasses/contact lenses and Yellow Eyes. Cardiovascular Present- Palpitations. Not Present- Chest Pain, Difficulty Breathing Lying Down, Leg Cramps, Rapid Heart Rate, Shortness of Breath and  Swelling of Extremities. Female Genitourinary Not Present- Frequency, Nocturia, Painful Urination, Pelvic Pain and Urgency. Neurological Present- Headaches. Not Present- Decreased Memory, Fainting, Numbness, Seizures, Tingling, Tremor, Trouble walking and Weakness.  Vitals (Donyelle Alston CNA; 11/25/2020 10:15 AM) 11/25/2020 10:15 AM Weight: 219.5 lb Height: 65in Body Surface Area: 2.06 m Body Mass Index: 36.53 kg/m  Temp.: 49F  Pulse: 100 (Regular)  P.OX: 99% (Room air) BP: 140/76(Sitting, Left Arm, Standard)  Physical Exam (Hideo Googe A. Chelan Heringer MD; 11/25/2020 12:41 PM)  General Mental Status-Alert. General Appearance-Consistent with stated age. Hydration-Well hydrated. Voice-Normal.  Head and Neck Head-normocephalic, atraumatic with no lesions or palpable masses. Trachea-midline. Thyroid Gland Characteristics - normal size and consistency.  Breast Breast - Left-Symmetric, Non Tender, No Biopsy scars, no Dimpling - Left, No Inflammation, No Lumpectomy scars, No Mastectomy scars, No Peau d' Orange. Breast - Right-Symmetric, Non Tender, No Biopsy scars, no Dimpling - Right, No Inflammation, No Lumpectomy scars, No Mastectomy scars, No Peau d' Orange. Breast Lump-No Palpable Breast Mass.  Cardiovascular Cardiovascular examination reveals -normal heart sounds, regular rate and rhythm with no murmurs and normal pedal pulses bilaterally.  Neurologic Neurologic evaluation reveals -alert and oriented x 3 with no impairment of recent or remote memory. Mental Status-Normal.  Lymphatic Head & Neck  General Head & Neck Lymphatics: Bilateral - Description - Normal. Axillary  General Axillary Region: Bilateral - Description - Normal. Tenderness - Non Tender.    Assessment & Plan (Shaelyn Decarli A. Edrees Valent MD; 11/25/2020 12:43 PM)  LEFT BREAST MASS (N63.20) Impression: Discussed discordant biopsy and rationale for left breast lumpectomy. The  patient is eager to Proceed with left breast seed localized lumpectomy. Risk of lumpectomy include bleeding, infection, seroma, more surgery, use of seed/wire, wound care, cosmetic deformity and the need for other treatments, death , blood clots, death. Pt agrees to proceed. Lifetime breast cancer risk is 22% making her high risk.  Current Plans Pt Education - [facility] Breast Biopsy HCI: discussed with patient and provided information. You are being scheduled for surgery- Our schedulers will call you.  You should hear from our office's scheduling department within 5 working days about the location, date, and time of surgery. We try to make accommodations for patient's preferences in scheduling surgery, but sometimes the OR schedule or the surgeon's schedule prevents [place] from making those accommodations.  If you have not heard from our office [phone]) in 5 working days, call the office and ask for your surgeon's nurse.  If you have other questions about your diagnosis, plan, or surgery, call the office and ask for your surgeon's nurse.   AT HIGH RISK FOR BREAST CANCER (Z91.89) Impression: Recommend magnetic resonance imaging yearly Discuss high risk state in medical surgical options of management. Her genetic testing is already been done and she states she is "negative"

## 2020-11-26 ENCOUNTER — Other Ambulatory Visit: Payer: Self-pay | Admitting: Internal Medicine

## 2020-11-28 ENCOUNTER — Other Ambulatory Visit: Payer: Self-pay | Admitting: Surgery

## 2020-11-28 DIAGNOSIS — N6320 Unspecified lump in the left breast, unspecified quadrant: Secondary | ICD-10-CM

## 2020-12-10 ENCOUNTER — Encounter (HOSPITAL_BASED_OUTPATIENT_CLINIC_OR_DEPARTMENT_OTHER): Payer: Self-pay | Admitting: Surgery

## 2020-12-11 ENCOUNTER — Other Ambulatory Visit: Payer: Self-pay

## 2020-12-11 ENCOUNTER — Encounter (HOSPITAL_BASED_OUTPATIENT_CLINIC_OR_DEPARTMENT_OTHER): Payer: Self-pay | Admitting: Surgery

## 2020-12-12 ENCOUNTER — Ambulatory Visit
Admission: RE | Admit: 2020-12-12 | Discharge: 2020-12-12 | Disposition: A | Discharge status: Home / Self Care | Payer: BC Managed Care – PPO | Source: Ambulatory Visit | Attending: Obstetrics and Gynecology | Admitting: Obstetrics and Gynecology

## 2020-12-12 ENCOUNTER — Encounter (HOSPITAL_BASED_OUTPATIENT_CLINIC_OR_DEPARTMENT_OTHER)
Admission: RE | Admit: 2020-12-12 | Discharge: 2020-12-12 | Disposition: A | Discharge status: Home / Self Care | Payer: BC Managed Care – PPO | Source: Ambulatory Visit | Attending: Surgery | Admitting: Surgery

## 2020-12-12 DIAGNOSIS — Z01812 Encounter for preprocedural laboratory examination: Secondary | ICD-10-CM | POA: Insufficient documentation

## 2020-12-12 DIAGNOSIS — N6459 Other signs and symptoms in breast: Secondary | ICD-10-CM

## 2020-12-12 LAB — CBC WITH DIFFERENTIAL/PLATELET
Abs Immature Granulocytes: 0.01 10*3/uL (ref 0.00–0.07)
Basophils Absolute: 0.1 10*3/uL (ref 0.0–0.1)
Basophils Relative: 1 %
Eosinophils Absolute: 0.1 10*3/uL (ref 0.0–0.5)
Eosinophils Relative: 1 %
HCT: 34.2 % — ABNORMAL LOW (ref 36.0–46.0)
Hemoglobin: 11 g/dL — ABNORMAL LOW (ref 12.0–15.0)
Immature Granulocytes: 0 %
Lymphocytes Relative: 31 %
Lymphs Abs: 1.5 10*3/uL (ref 0.7–4.0)
MCH: 25.4 pg — ABNORMAL LOW (ref 26.0–34.0)
MCHC: 32.2 g/dL (ref 30.0–36.0)
MCV: 79 fL — ABNORMAL LOW (ref 80.0–100.0)
Monocytes Absolute: 0.3 10*3/uL (ref 0.1–1.0)
Monocytes Relative: 5 %
Neutro Abs: 2.9 10*3/uL (ref 1.7–7.7)
Neutrophils Relative %: 62 %
Platelets: 304 10*3/uL (ref 150–400)
RBC: 4.33 MIL/uL (ref 3.87–5.11)
RDW: 16.3 % — ABNORMAL HIGH (ref 11.5–15.5)
WBC: 4.9 10*3/uL (ref 4.0–10.5)
nRBC: 0 % (ref 0.0–0.2)

## 2020-12-12 LAB — COMPREHENSIVE METABOLIC PANEL
ALT: 9 U/L (ref 0–44)
AST: 15 U/L (ref 15–41)
Albumin: 3.4 g/dL — ABNORMAL LOW (ref 3.5–5.0)
Alkaline Phosphatase: 70 U/L (ref 38–126)
Anion gap: 7 (ref 5–15)
BUN: 14 mg/dL (ref 6–20)
CO2: 24 mmol/L (ref 22–32)
Calcium: 9.1 mg/dL (ref 8.9–10.3)
Chloride: 105 mmol/L (ref 98–111)
Creatinine, Ser: 1.21 mg/dL — ABNORMAL HIGH (ref 0.44–1.00)
GFR, Estimated: 55 mL/min — ABNORMAL LOW (ref >60)
Glucose, Bld: 114 mg/dL — ABNORMAL HIGH (ref 70–99)
Potassium: 3.8 mmol/L (ref 3.5–5.1)
Sodium: 136 mmol/L (ref 135–145)
Total Bilirubin: 0.4 mg/dL (ref 0.3–1.2)
Total Protein: 6.7 g/dL (ref 6.5–8.1)

## 2020-12-12 NOTE — Progress Notes (Signed)

## 2020-12-14 ENCOUNTER — Other Ambulatory Visit: Payer: Self-pay | Admitting: Internal Medicine

## 2020-12-16 ENCOUNTER — Other Ambulatory Visit: Payer: Self-pay

## 2020-12-16 ENCOUNTER — Other Ambulatory Visit
Admission: RE | Admit: 2020-12-16 | Discharge: 2020-12-16 | Disposition: A | Discharge status: Home / Self Care | Payer: BC Managed Care – PPO | Source: Ambulatory Visit | Attending: Surgery | Admitting: Surgery

## 2020-12-16 DIAGNOSIS — Z20822 Contact with and (suspected) exposure to covid-19: Secondary | ICD-10-CM | POA: Diagnosis not present

## 2020-12-16 DIAGNOSIS — Z01812 Encounter for preprocedural laboratory examination: Secondary | ICD-10-CM | POA: Diagnosis present

## 2020-12-16 LAB — SARS CORONAVIRUS 2 (TAT 6-24 HRS): SARS Coronavirus 2: NEGATIVE

## 2020-12-16 NOTE — Telephone Encounter (Signed)
Xanax last filled 07/16/2020   Flexeril last filled 08/20/2020  Please advise

## 2020-12-17 ENCOUNTER — Other Ambulatory Visit: Payer: Self-pay

## 2020-12-17 ENCOUNTER — Ambulatory Visit
Admission: RE | Admit: 2020-12-17 | Discharge: 2020-12-17 | Disposition: A | Discharge status: Home / Self Care | Payer: BC Managed Care – PPO | Source: Ambulatory Visit | Attending: Surgery | Admitting: Surgery

## 2020-12-17 DIAGNOSIS — N6320 Unspecified lump in the left breast, unspecified quadrant: Secondary | ICD-10-CM

## 2020-12-17 MED ORDER — ALPRAZOLAM 0.25 MG PO TABS: 0.2500 mg | ORAL_TABLET | Freq: Two times a day (BID) | ORAL | 0 refills | Status: DC | PRN

## 2020-12-17 MED ORDER — CYCLOBENZAPRINE HCL 10 MG PO TABS: 10.0000 mg | ORAL_TABLET | Freq: Every day | ORAL | 0 refills | Status: DC | PRN

## 2020-12-17 MED ORDER — GABAPENTIN 100 MG PO CAPS: 100.0000 mg | ORAL_CAPSULE | Freq: Three times a day (TID) | ORAL | 0 refills | Status: DC

## 2020-12-18 ENCOUNTER — Encounter (HOSPITAL_BASED_OUTPATIENT_CLINIC_OR_DEPARTMENT_OTHER)
Admission: RE | Disposition: A | Discharge status: Home / Self Care | Payer: Self-pay | Source: Home / Self Care | Attending: Surgery

## 2020-12-18 ENCOUNTER — Other Ambulatory Visit: Payer: Self-pay

## 2020-12-18 ENCOUNTER — Ambulatory Visit
Admission: RE | Admit: 2020-12-18 | Discharge: 2020-12-18 | Disposition: A | Discharge status: Home / Self Care | Payer: BC Managed Care – PPO | Source: Ambulatory Visit | Attending: Surgery | Admitting: Surgery

## 2020-12-18 ENCOUNTER — Ambulatory Visit (HOSPITAL_BASED_OUTPATIENT_CLINIC_OR_DEPARTMENT_OTHER)
Admission: RE | Admit: 2020-12-18 | Discharge: 2020-12-18 | Disposition: A | Discharge status: Home / Self Care | Payer: BC Managed Care – PPO | Attending: Surgery | Admitting: Surgery

## 2020-12-18 ENCOUNTER — Encounter (HOSPITAL_BASED_OUTPATIENT_CLINIC_OR_DEPARTMENT_OTHER): Payer: Self-pay | Admitting: Surgery

## 2020-12-18 ENCOUNTER — Ambulatory Visit (HOSPITAL_BASED_OUTPATIENT_CLINIC_OR_DEPARTMENT_OTHER): Payer: BC Managed Care – PPO | Admitting: Anesthesiology

## 2020-12-18 DIAGNOSIS — N6320 Unspecified lump in the left breast, unspecified quadrant: Secondary | ICD-10-CM

## 2020-12-18 DIAGNOSIS — Z170 Estrogen receptor positive status [ER+]: Secondary | ICD-10-CM | POA: Insufficient documentation

## 2020-12-18 DIAGNOSIS — C50919 Malignant neoplasm of unspecified site of unspecified female breast: Secondary | ICD-10-CM

## 2020-12-18 DIAGNOSIS — C50412 Malignant neoplasm of upper-outer quadrant of left female breast: Secondary | ICD-10-CM | POA: Insufficient documentation

## 2020-12-18 DIAGNOSIS — Z7952 Long term (current) use of systemic steroids: Secondary | ICD-10-CM | POA: Diagnosis not present

## 2020-12-18 DIAGNOSIS — Z79899 Other long term (current) drug therapy: Secondary | ICD-10-CM | POA: Insufficient documentation

## 2020-12-18 DIAGNOSIS — Z79890 Hormone replacement therapy (postmenopausal): Secondary | ICD-10-CM | POA: Insufficient documentation

## 2020-12-18 DIAGNOSIS — Z881 Allergy status to other antibiotic agents status: Secondary | ICD-10-CM | POA: Insufficient documentation

## 2020-12-18 DIAGNOSIS — Z803 Family history of malignant neoplasm of breast: Secondary | ICD-10-CM | POA: Insufficient documentation

## 2020-12-18 DIAGNOSIS — N6321 Unspecified lump in the left breast, upper outer quadrant: Secondary | ICD-10-CM | POA: Diagnosis present

## 2020-12-18 DIAGNOSIS — Z888 Allergy status to other drugs, medicaments and biological substances status: Secondary | ICD-10-CM | POA: Insufficient documentation

## 2020-12-18 DIAGNOSIS — Z79891 Long term (current) use of opiate analgesic: Secondary | ICD-10-CM | POA: Diagnosis not present

## 2020-12-18 HISTORY — DX: Malignant neoplasm of unspecified site of unspecified female breast: C50.919

## 2020-12-18 HISTORY — DX: Essential (primary) hypertension: I10

## 2020-12-18 HISTORY — PX: BREAST LUMPECTOMY: SHX2

## 2020-12-18 HISTORY — PX: BREAST LUMPECTOMY WITH RADIOACTIVE SEED LOCALIZATION: SHX6424

## 2020-12-18 HISTORY — DX: Cardiac arrhythmia, unspecified: I49.9

## 2020-12-18 LAB — POCT PREGNANCY, URINE: Preg Test, Ur: NEGATIVE

## 2020-12-18 SURGERY — BREAST LUMPECTOMY WITH RADIOACTIVE SEED LOCALIZATION
Anesthesia: General | Site: Breast | Laterality: Left

## 2020-12-18 MED ORDER — ACETAMINOPHEN 500 MG PO TABS
ORAL_TABLET | ORAL | Status: AC
  Filled 2020-12-18: qty 2

## 2020-12-18 MED ORDER — PHENYLEPHRINE 40 MCG/ML (10ML) SYRINGE FOR IV PUSH (FOR BLOOD PRESSURE SUPPORT)
PREFILLED_SYRINGE | INTRAVENOUS | Status: AC
  Filled 2020-12-18: qty 10

## 2020-12-18 MED ORDER — PROPOFOL 10 MG/ML IV BOLUS
INTRAVENOUS | Status: DC | PRN
  Administered 2020-12-18: 200 mg via INTRAVENOUS

## 2020-12-18 MED ORDER — FENTANYL CITRATE (PF) 100 MCG/2ML IJ SOLN
INTRAMUSCULAR | Status: AC
  Filled 2020-12-18: qty 2

## 2020-12-18 MED ORDER — CHLORHEXIDINE GLUCONATE CLOTH 2 % EX PADS: 6.0000 | MEDICATED_PAD | Freq: Once | CUTANEOUS | Status: DC

## 2020-12-18 MED ORDER — VANCOMYCIN HCL 500 MG IV SOLR
INTRAVENOUS | Status: DC | PRN
  Administered 2020-12-18: 500 mg via TOPICAL

## 2020-12-18 MED ORDER — OXYCODONE HCL 5 MG PO TABS
5.0000 mg | ORAL_TABLET | Freq: Once | ORAL | Status: AC
  Administered 2020-12-18: 5 mg via ORAL

## 2020-12-18 MED ORDER — ACETAMINOPHEN 500 MG PO TABS
1000.0000 mg | ORAL_TABLET | ORAL | Status: AC
  Administered 2020-12-18: 1000 mg via ORAL

## 2020-12-18 MED ORDER — FENTANYL CITRATE (PF) 100 MCG/2ML IJ SOLN
INTRAMUSCULAR | Status: DC | PRN
  Administered 2020-12-18: 50 ug via INTRAVENOUS

## 2020-12-18 MED ORDER — BUPIVACAINE-EPINEPHRINE (PF) 0.25% -1:200000 IJ SOLN
INTRAMUSCULAR | Status: DC | PRN
  Administered 2020-12-18: 17 mL

## 2020-12-18 MED ORDER — CLINDAMYCIN PHOSPHATE 900 MG/50ML IV SOLN: 900.0000 mg | INTRAVENOUS | Status: DC

## 2020-12-18 MED ORDER — VANCOMYCIN HCL 500 MG IV SOLR
INTRAVENOUS | Status: AC
  Filled 2020-12-18: qty 500

## 2020-12-18 MED ORDER — MIDAZOLAM HCL 2 MG/2ML IJ SOLN
INTRAMUSCULAR | Status: AC
  Filled 2020-12-18: qty 2

## 2020-12-18 MED ORDER — ONDANSETRON HCL 4 MG/2ML IJ SOLN
INTRAMUSCULAR | Status: AC
  Filled 2020-12-18: qty 2

## 2020-12-18 MED ORDER — PROPOFOL 10 MG/ML IV BOLUS
INTRAVENOUS | Status: AC
  Filled 2020-12-18: qty 20

## 2020-12-18 MED ORDER — OXYCODONE HCL 5 MG PO TABS: 5.0000 mg | ORAL_TABLET | Freq: Four times a day (QID) | ORAL | 0 refills | Status: DC | PRN

## 2020-12-18 MED ORDER — BUPIVACAINE-EPINEPHRINE (PF) 0.25% -1:200000 IJ SOLN
INTRAMUSCULAR | Status: AC
  Filled 2020-12-18: qty 30

## 2020-12-18 MED ORDER — SUCCINYLCHOLINE CHLORIDE 200 MG/10ML IV SOSY
PREFILLED_SYRINGE | INTRAVENOUS | Status: AC
  Filled 2020-12-18: qty 10

## 2020-12-18 MED ORDER — LIDOCAINE 2% (20 MG/ML) 5 ML SYRINGE
INTRAMUSCULAR | Status: DC | PRN
  Administered 2020-12-18: 60 mg via INTRAVENOUS

## 2020-12-18 MED ORDER — LIDOCAINE 2% (20 MG/ML) 5 ML SYRINGE
INTRAMUSCULAR | Status: AC
  Filled 2020-12-18: qty 5

## 2020-12-18 MED ORDER — DEXAMETHASONE SODIUM PHOSPHATE 10 MG/ML IJ SOLN
INTRAMUSCULAR | Status: AC
  Filled 2020-12-18: qty 1

## 2020-12-18 MED ORDER — CELECOXIB 200 MG PO CAPS
ORAL_CAPSULE | ORAL | Status: AC
  Filled 2020-12-18: qty 1

## 2020-12-18 MED ORDER — SODIUM CHLORIDE 0.9 % IV SOLN
INTRAVENOUS | Status: AC
  Filled 2020-12-18: qty 10

## 2020-12-18 MED ORDER — OXYCODONE HCL 5 MG PO TABS
ORAL_TABLET | ORAL | Status: AC
  Filled 2020-12-18: qty 1

## 2020-12-18 MED ORDER — LACTATED RINGERS IV SOLN: INTRAVENOUS | Status: DC

## 2020-12-18 MED ORDER — FENTANYL CITRATE (PF) 100 MCG/2ML IJ SOLN: 25.0000 ug | INTRAMUSCULAR | Status: DC | PRN

## 2020-12-18 MED ORDER — CELECOXIB 200 MG PO CAPS
200.0000 mg | ORAL_CAPSULE | ORAL | Status: AC
  Administered 2020-12-18: 200 mg via ORAL

## 2020-12-18 MED ORDER — DEXAMETHASONE SODIUM PHOSPHATE 4 MG/ML IJ SOLN
INTRAMUSCULAR | Status: DC | PRN
  Administered 2020-12-18: 10 mg via INTRAVENOUS

## 2020-12-18 MED ORDER — MIDAZOLAM HCL 5 MG/5ML IJ SOLN
INTRAMUSCULAR | Status: DC | PRN
  Administered 2020-12-18: 2 mg via INTRAVENOUS

## 2020-12-18 MED ORDER — CLINDAMYCIN PHOSPHATE 900 MG/50ML IV SOLN
INTRAVENOUS | Status: AC
  Filled 2020-12-18: qty 50

## 2020-12-18 MED ORDER — ONDANSETRON HCL 4 MG/2ML IJ SOLN
INTRAMUSCULAR | Status: DC | PRN
  Administered 2020-12-18: 4 mg via INTRAVENOUS

## 2020-12-18 MED ORDER — EPHEDRINE 5 MG/ML INJ
INTRAVENOUS | Status: AC
  Filled 2020-12-18: qty 10

## 2020-12-18 MED ORDER — IBUPROFEN 800 MG PO TABS: 800.0000 mg | ORAL_TABLET | Freq: Three times a day (TID) | ORAL | 0 refills | Status: DC | PRN

## 2020-12-18 SURGICAL SUPPLY — 50 items
APPLIER CLIP 9.375 MED OPEN (MISCELLANEOUS)
BINDER BREAST LRG (GAUZE/BANDAGES/DRESSINGS) IMPLANT
BINDER BREAST MEDIUM (GAUZE/BANDAGES/DRESSINGS) IMPLANT
BINDER BREAST XLRG (GAUZE/BANDAGES/DRESSINGS) ×2 IMPLANT
BINDER BREAST XXLRG (GAUZE/BANDAGES/DRESSINGS) IMPLANT
BLADE SURG 15 STRL LF DISP TIS (BLADE) ×1 IMPLANT
BLADE SURG 15 STRL SS (BLADE) ×1
CANISTER SUC SOCK COL 7IN (MISCELLANEOUS) IMPLANT
CANISTER SUCT 1200ML W/VALVE (MISCELLANEOUS) IMPLANT
CHLORAPREP W/TINT 26 (MISCELLANEOUS) ×2 IMPLANT
CLIP APPLIE 9.375 MED OPEN (MISCELLANEOUS) IMPLANT
COVER BACK TABLE 60X90IN (DRAPES) ×2 IMPLANT
COVER MAYO STAND STRL (DRAPES) ×2 IMPLANT
COVER PROBE W GEL 5X96 (DRAPES) ×2 IMPLANT
COVER WAND RF STERILE (DRAPES) IMPLANT
DECANTER SPIKE VIAL GLASS SM (MISCELLANEOUS) IMPLANT
DERMABOND ADVANCED (GAUZE/BANDAGES/DRESSINGS) ×1
DERMABOND ADVANCED .7 DNX12 (GAUZE/BANDAGES/DRESSINGS) ×1 IMPLANT
DRAPE LAPAROSCOPIC ABDOMINAL (DRAPES) IMPLANT
DRAPE LAPAROTOMY 100X72 PEDS (DRAPES) ×2 IMPLANT
DRAPE UTILITY XL STRL (DRAPES) ×2 IMPLANT
ELECT COATED BLADE 2.86 ST (ELECTRODE) ×2 IMPLANT
ELECT REM PT RETURN 9FT ADLT (ELECTROSURGICAL) ×2
ELECTRODE REM PT RTRN 9FT ADLT (ELECTROSURGICAL) ×1 IMPLANT
GLOVE SRG 8 PF TXTR STRL LF DI (GLOVE) ×1 IMPLANT
GLOVE SURG ENC MOIS LTX SZ6.5 (GLOVE) ×2 IMPLANT
GLOVE SURG LTX SZ8 (GLOVE) ×2 IMPLANT
GLOVE SURG UNDER POLY LF SZ7 (GLOVE) ×2 IMPLANT
GLOVE SURG UNDER POLY LF SZ8 (GLOVE) ×1
GOWN STRL REUS W/ TWL LRG LVL3 (GOWN DISPOSABLE) ×2 IMPLANT
GOWN STRL REUS W/ TWL XL LVL3 (GOWN DISPOSABLE) ×1 IMPLANT
GOWN STRL REUS W/TWL LRG LVL3 (GOWN DISPOSABLE) ×2
GOWN STRL REUS W/TWL XL LVL3 (GOWN DISPOSABLE) ×1
HEMOSTAT ARISTA ABSORB 3G PWDR (HEMOSTASIS) IMPLANT
HEMOSTAT SNOW SURGICEL 2X4 (HEMOSTASIS) IMPLANT
KIT MARKER MARGIN INK (KITS) ×2 IMPLANT
NEEDLE HYPO 25X1 1.5 SAFETY (NEEDLE) ×2 IMPLANT
NS IRRIG 1000ML POUR BTL (IV SOLUTION) ×2 IMPLANT
PACK BASIN DAY SURGERY FS (CUSTOM PROCEDURE TRAY) ×2 IMPLANT
PENCIL SMOKE EVACUATOR (MISCELLANEOUS) ×2 IMPLANT
SLEEVE SCD COMPRESS KNEE MED (STOCKING) ×2 IMPLANT
SPONGE LAP 4X18 RFD (DISPOSABLE) ×2 IMPLANT
SUT MNCRL AB 4-0 PS2 18 (SUTURE) ×2 IMPLANT
SUT SILK 2 0 SH (SUTURE) IMPLANT
SUT VICRYL 3-0 CR8 SH (SUTURE) ×2 IMPLANT
SYR CONTROL 10ML LL (SYRINGE) ×2 IMPLANT
TOWEL GREEN STERILE FF (TOWEL DISPOSABLE) ×2 IMPLANT
TRAY FAXITRON CT DISP (TRAY / TRAY PROCEDURE) ×2 IMPLANT
TUBE CONNECTING 20X1/4 (TUBING) IMPLANT
YANKAUER SUCT BULB TIP NO VENT (SUCTIONS) IMPLANT

## 2020-12-18 NOTE — Anesthesia Procedure Notes (Signed)
Procedure Name: LMA Insertion Date/Time: 12/18/2020 3:37 PM Performed by: Willa Frater, CRNA Pre-anesthesia Checklist: Patient identified, Emergency Drugs available, Suction available and Patient being monitored Patient Re-evaluated:Patient Re-evaluated prior to induction Oxygen Delivery Method: Circle system utilized Preoxygenation: Pre-oxygenation with 100% oxygen Induction Type: IV induction Ventilation: Mask ventilation without difficulty LMA: LMA inserted LMA Size: 4.0 Number of attempts: 1 Airway Equipment and Method: Bite block Placement Confirmation: positive ETCO2 Tube secured with: Tape Dental Injury: Teeth and Oropharynx as per pre-operative assessment

## 2020-12-18 NOTE — Interval H&P Note (Signed)
History and Physical Interval Note:  12/18/2020 3:25 PM  [name]  has presented today for surgery, with the diagnosis of LEFT BREAST MASS.  The various methods of treatment have been discussed with the patient and family. After consideration of risks, benefits and other options for treatment, the patient has consented to  Procedure(s): LEFT BREAST LUMPECTOMY WITH RADIOACTIVE SEED LOCALIZATION (Left) as a surgical intervention.  The patient's history has been reviewed, patient examined, no change in status, stable for surgery.  I have reviewed the patient's chart and labs.  Questions were answered to the patient's satisfaction.     Turner Daniels MD

## 2020-12-18 NOTE — Transfer of Care (Signed)
Immediate Anesthesia Transfer of Care Note  Patient: [name]  Procedure(s) Performed: LEFT BREAST LUMPECTOMY WITH RADIOACTIVE SEED LOCALIZATION (Left Breast)  Patient Location: PACU  Anesthesia Type:General  Level of Consciousness: awake, alert , drowsy and patient cooperative  Airway & Oxygen Therapy: Patient Spontanous Breathing and Patient connected to face mask oxygen  Post-op Assessment: Report given to RN and Post -op Vital signs reviewed and stable  Post vital signs: Reviewed and stable  Last Vitals:  Vitals Value Taken Time  BP    Temp    Pulse    Resp    SpO2      Last Pain:  Vitals:   12/18/20 1318  TempSrc: Oral  PainSc: 0-No pain      Patients Stated Pain Goal: 6 (11/64/35 3912)  Complications: No complications documented.

## 2020-12-18 NOTE — Anesthesia Preprocedure Evaluation (Addendum)
Anesthesia Evaluation  Patient identified by MRN, date of birth, ID band Patient awake    Reviewed: Allergy & Precautions, NPO status , Patient's Chart, lab work & pertinent test results  Airway Mallampati: II  TM Distance: >3 FB Neck ROM: Full    Dental  (+) Missing, Chipped, Dental Advisory Given,    Pulmonary neg pulmonary ROS,    Pulmonary exam normal breath sounds clear to auscultation       Cardiovascular hypertension, Pt. on medications Normal cardiovascular exam+ dysrhythmias (PACs, nonsustained VT on flecainide)  Rhythm:Regular Rate:Normal  TTE 2020 1. Left ventricular ejection fraction, by visual estimation, is 55 to  60%. The left ventricle has normal function. Normal left ventricular size.  There is borderline left ventricular hypertrophy.  2. Global right ventricle has normal systolic function.The right  ventricular size is mildly enlarged. No increase in right ventricular wall  thickness.  3. Left atrial size was normal.  4. Right atrial size was normal.  5. The mitral valve is normal in structure. Mild mitral valve  regurgitation.  6. The tricuspid valve is grossly normal. Tricuspid valve regurgitation  is mild.  7. The aortic valve is tricuspid Aortic valve regurgitation was not  visualized by color flow Doppler. Structurally normal aortic valve, with  no evidence of sclerosis or stenosis.  8. The pulmonic valve was normal in structure. Pulmonic valve  regurgitation is mild by color flow Doppler.  9. Normal pulmonary artery systolic pressure.  10. The inferior vena cava is normal in size with greater than 50%  respiratory variability, suggesting right atrial pressure of 3 mmHg  Stress Test 2021 Negative  Event Monitor 2020 Normal sinus rhythm Avg HR of 77 bpm.  1 run of Ventricular Tachycardia occurred lasting 7 beats with a max rate of 164 bpm (avg 148 bpm).   9 Supraventricular Tachycardia  runs occurred, the run with the fastest interval lasting 7 beats with a max rate of 200 bpm, the longest lasting 10 beats with an avg rate of 131 bpm.  Isolated SVEs were occasional (2.1%, 63875), SVE Couplets were rare (<1.0%, 35), and SVE Triplets were rare (<1.0%, 8).  Isolated VEs were rare (<1.0%), and no VE Couplets or VE Triplets were present.  Patient triggered events associated with normal sinus rhythm, sometimes with PACs    Neuro/Psych  Headaches, PSYCHIATRIC DISORDERS Anxiety    GI/Hepatic negative GI ROS, Neg liver ROS,   Endo/Other  Hypothyroidism   Renal/GU negative Renal ROS  negative genitourinary   Musculoskeletal negative musculoskeletal ROS (+)   Abdominal   Peds  Hematology negative hematology ROS (+)   Anesthesia Other Findings   Reproductive/Obstetrics                            Anesthesia Physical Anesthesia Plan  ASA: III  Anesthesia Plan: General   Post-op Pain Management:    Induction: Intravenous  PONV Risk Score and Plan: 3 and Ondansetron, Dexamethasone and Midazolam  Airway Management Planned: LMA  Additional Equipment:   Intra-op Plan:   Post-operative Plan: Extubation in OR  Informed Consent: I have reviewed the patients History and Physical, chart, labs and discussed the procedure including the risks, benefits and alternatives for the proposed anesthesia with the patient or authorized representative who has indicated his/her understanding and acceptance.     Dental advisory given  Plan Discussed with: CRNA  Anesthesia Plan Comments:         Anesthesia Quick  Evaluation

## 2020-12-18 NOTE — Anesthesia Procedure Notes (Signed)
Procedure Name: LMA Insertion Date/Time: 12/18/2020 3:38 PM Performed by: Willa Frater, CRNA Pre-anesthesia Checklist: Patient identified, Emergency Drugs available, Suction available and Patient being monitored Patient Re-evaluated:Patient Re-evaluated prior to induction Oxygen Delivery Method: Circle system utilized Preoxygenation: Pre-oxygenation with 100% oxygen Induction Type: IV induction Ventilation: Mask ventilation without difficulty LMA: LMA inserted LMA Size: 4.0 Number of attempts: 1 Airway Equipment and Method: Bite block Placement Confirmation: positive ETCO2 Tube secured with: Tape Dental Injury: Teeth and Oropharynx as per pre-operative assessment

## 2020-12-18 NOTE — Discharge Instructions (Signed)
[place] Office Phone Number [phone]  BREAST BIOPSY/ PARTIAL MASTECTOMY: POST OP INSTRUCTIONS  Always review your discharge instruction sheet given to you by the facility where your surgery was performed.  IF YOU HAVE DISABILITY OR FAMILY LEAVE FORMS, YOU MUST BRING THEM TO THE OFFICE FOR PROCESSING.  DO NOT GIVE THEM TO YOUR DOCTOR.  1. A prescription for pain medication may be given to you upon discharge.  Take your pain medication as prescribed, if needed.  If narcotic pain medicine is not needed, then you may take acetaminophen (Tylenol) or ibuprofen (Advil) as needed. 2. Take your usually prescribed medications unless otherwise directed 3. If you need a refill on your pain medication, please contact your pharmacy.  They will contact our office to request authorization.  Prescriptions will not be filled after 5pm or on week-ends. 4. You should eat very light the first 24 hours after surgery, such as soup, crackers, pudding, etc.  Resume your normal diet the day after surgery. 5. Most patients will experience some swelling and bruising in the breast.  Ice packs and a good support bra will help.  Swelling and bruising can take several days to resolve.  6. It is common to experience some constipation if taking pain medication after surgery.  Increasing fluid intake and taking a stool softener will usually help or prevent this problem from occurring.  A mild laxative (Milk of Magnesia or Miralax) should be taken according to package directions if there are no bowel movements after 48 hours. 7. Unless discharge instructions indicate otherwise, you may remove your bandages 24-48 hours after surgery, and you may shower at that time.  You may have steri-strips (small skin tapes) in place directly over the incision.  These strips should be left on the skin for 7-10 days.  If your surgeon used skin glue on the incision, you may shower in 24 hours.  The glue will flake off over the  next 2-3 weeks.  Any sutures or staples will be removed at the office during your follow-up visit. 8. ACTIVITIES:  You may resume regular daily activities (gradually increasing) beginning the next day.  Wearing a good support bra or sports bra minimizes pain and swelling.  You may have sexual intercourse when it is comfortable. a. You may drive when you no longer are taking prescription pain medication, you can comfortably wear a seatbelt, and you can safely maneuver your car and apply brakes. b. RETURN TO WORK:  ______________________________________________________________________________________ 9. You should see your doctor in the office for a follow-up appointment approximately two weeks after your surgery.  Your doctor's nurse will typically make your follow-up appointment when she calls you with your pathology report.  Expect your pathology report 2-3 business days after your surgery.  You may call to check if you do not hear from [place] after three days. 10. OTHER INSTRUCTIONS: _______________________________________________________________________________________________ _____________________________________________________________________________________________________________________________________ _____________________________________________________________________________________________________________________________________ _____________________________________________________________________________________________________________________________________  WHEN TO CALL YOUR DOCTOR: 1. Fever over 101.0 2. Nausea and/or vomiting. 3. Extreme swelling or bruising. 4. Continued bleeding from incision. 5. Increased pain, redness, or drainage from the incision.  The clinic staff is available to answer your questions during regular business hours.  Please don't hesitate to call and ask to speak to one of the nurses for clinical concerns.  If you have a medical emergency, go to the nearest  emergency room or call 911.  A surgeon from [facility] is always on call at the hospital.  For further questions, please visit [email or web address]  No Tylenol until 7:36 pm No Ibuprofen/Motrin until 9:37 pm   Post Anesthesia Home Care Instructions  Activity: Get plenty of rest for the remainder of the day. A responsible individual must stay with you for 24 hours following the procedure.  For the next 24 hours, DO NOT: -Drive a car -Paediatric nurse -Drink alcoholic beverages -Take any medication unless instructed by your physician -Make any legal decisions or sign important papers.  Meals: Start with liquid foods such as gelatin or soup. Progress to regular foods as tolerated. Avoid greasy, spicy, heavy foods. If nausea and/or vomiting occur, drink only clear liquids until the nausea and/or vomiting subsides. Call your physician if vomiting continues.  Special Instructions/Symptoms: Your throat may feel dry or sore from the anesthesia or the breathing tube placed in your throat during surgery. If this causes discomfort, gargle with warm salt water. The discomfort should disappear within 24 hours.  If you had a scopolamine patch placed behind your ear for the management of post- operative nausea and/or vomiting:  1. The medication in the patch is effective for 72 hours, after which it should be removed.  Wrap patch in a tissue and discard in the trash. Wash hands thoroughly with soap and water. 2. You may remove the patch earlier than 72 hours if you experience unpleasant side effects which may include dry mouth, dizziness or visual disturbances. 3. Avoid touching the patch. Wash your hands with soap and water after contact with the patch.

## 2020-12-18 NOTE — Op Note (Signed)
Preoperative diagnosis: Left breast mass  Postoperative diagnosis: Same  Procedure: Left breast seed localized lumpectomy  Surgeon: Erroll Luna, MD  Anesthesia: LMA with local of 0.25% Marcaine  EBL: 20 cc  Specimen: Left breast tissue with seed and clip verified by Faxitron  Drains: None  IV fluids: Per anesthesia record  Indications for procedure: The patient is a 50 year old female who underwent a mammogram which showed a left breast mass that was felt to be suspicious.  Core biopsy was discordant from the pathology which was benign.  Excision recommended due to the discordant biopsy.  We discussed the pros and cons of removal.  We discussed observation.  We discussed potential upgrade risk of anywhere from 3 to 10% in the circumstances.  She wished to proceed with left breast lumpectomy due to family history.The procedure has been discussed with the patient. Alternatives to surgery have been discussed with the patient.  Risks of surgery include bleeding,  Infection,  Seroma formation, death,  and the need for further surgery.   The patient understands and wishes to proceed.  Description of procedure: The patient was met in the holding area and questions were answered.  Neoprobe used to verify seed location left breast upper outer quadrant.  Films were available for review.  She was then taken back to the operating room.  She is placed supine upon the OR table.  After induction of general esthesia, left breast was prepped and draped in sterile fashion and timeout performed.  She received appropriate preoperative antibiotics.  Neoprobe used to identify seed location left breast.  Curvilinear incision was made in the upper outer quadrant.  Dissection was carried down and the seed and clip were located with the mass.  These were excised with grossly negative margins.  The Faxitron image revealed both seed and clip to be present.  Irrigation was used and hemostasis achieved with cautery.  Local  anesthetic was infiltrated throughout the lumpectomy cavity.  Vancomycin powder placed.  Wound closed with deep layer 3-0 Vicryl and 4 Monocryl.  Dermabond applied.  All counts were found to be correct.  The patient was awoke, extubated taken to recovery in satisfactory condition.

## 2020-12-19 ENCOUNTER — Encounter (HOSPITAL_BASED_OUTPATIENT_CLINIC_OR_DEPARTMENT_OTHER): Payer: Self-pay | Admitting: Surgery

## 2020-12-19 NOTE — Anesthesia Postprocedure Evaluation (Signed)
Anesthesia Post Note  Patient: [name]  Procedure(s) Performed: LEFT BREAST LUMPECTOMY WITH RADIOACTIVE SEED LOCALIZATION (Left Breast)     Patient location during evaluation: PACU Anesthesia Type: General Level of consciousness: awake and alert Pain management: pain level controlled Vital Signs Assessment: post-procedure vital signs reviewed and stable Respiratory status: spontaneous breathing, nonlabored ventilation, respiratory function stable and patient connected to nasal cannula oxygen Cardiovascular status: blood pressure returned to baseline and stable Postop Assessment: no apparent nausea or vomiting Anesthetic complications: no   No complications documented.  Last Vitals:  Vitals:   12/18/20 1655 12/18/20 1721  BP: 124/81 (!) 151/67  Pulse: 78 74  Resp: 16 18  Temp:  36.4 C  SpO2: 96% 98%    Last Pain:  Vitals:   12/18/20 1721  TempSrc:   PainSc: 4                  Claudius Mich L Peggy Loge

## 2020-12-26 LAB — SURGICAL PATHOLOGY

## 2020-12-27 ENCOUNTER — Telehealth: Payer: Self-pay | Admitting: Hematology

## 2020-12-27 NOTE — Telephone Encounter (Signed)
Received a new pt referral from Dr. Brantley Stage for a dx of breast cancer. Pt has been cld and scheduled to see Dr. Burr Medico on 4/28 at 3pm. Pt aware to arrive 20 minutes early.

## 2020-12-30 ENCOUNTER — Ambulatory Visit: Payer: Self-pay | Admitting: Surgery

## 2020-12-30 ENCOUNTER — Other Ambulatory Visit: Payer: Self-pay | Admitting: Internal Medicine

## 2020-12-30 DIAGNOSIS — Z170 Estrogen receptor positive status [ER+]: Secondary | ICD-10-CM

## 2020-12-30 DIAGNOSIS — C50912 Malignant neoplasm of unspecified site of left female breast: Secondary | ICD-10-CM

## 2021-01-01 ENCOUNTER — Encounter: Payer: Self-pay | Admitting: Hematology

## 2021-01-01 NOTE — Progress Notes (Signed)
[facility]   [phone]) [phone] [phone]   Clinic New Consult Note   Patient Care Team: Jearld Fenton, NP as PCP - General (Internal Medicine) Minna Merritts, MD as PCP - Cardiology (Cardiology)  Date of Service:  01/02/2021   CHIEF COMPLAINTS/PURPOSE OF CONSULTATION:  Newly diagnosed left breast cancer  REFERRING PHYSICIAN:  Dr. 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  IMPRESSION: Indeterminate left breast mass at 2 o'clock 7 cm from the nipple. Targeted ultrasound of the left axilla demonstrates normal lymph nodes.   10/31/2020 Pathology Results   Breast, left, needle core biopsy, 2 o'clock - FIBROADIPOSE TISSUE WITH MINIMAL BREAST PARENCHYMA DEMONSTRATING MILD FIBROCYSTIC CHANGE TO INCLUDE APOCRINE METAPLASIA.  This was found to be discordant.   12/18/2020 Surgery   A. BREAST, LEFT, LUMPECTOMY:  - Invasive lobular carcinoma, 1.1 cm, grade 2.  See comment  - Resection margins are negative for carcinoma; inferior margin is  focally less than 1 mm from carcinoma and medial margin is approximately  1 mm from carcinoma  - Biopsy related changes  - Background fibrocystic change   PROGNOSTIC INDICATOR RESULTS:  - The tumor cells are NEGATIVE for Her2 (0).  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 female is a here because of newly diagnosed left breast cancer. The patient was referred by Dr. Brantley Stage. The patient presents to the clinic today alone.   She was under short-term follow up after a left breast biopsy in 01/2020 showed fibrocystic changes. She underwent left diagnostic mammogram and left breast ultrasound on 10/28/20 showing: breast density category B; indeterminate 1.5 cm left breast mass at 2 o'clock; normal left axillary lymph nodes.  She proceeded to biopsy of the left breast area on 10/31/20, with pathology showing: fibroadipose tissue with minimal breast parenchyma demonstrating mild fibrocystic change to include apocrine metaplasia. This was found to be discordant, and excision was recommended. She underwent right diagnostic mammogram on 12/12/20 to complete her work up, and this showed no evidence of malignancy.  She underwent left lumpectomy on 12/18/20 under Dr. Brantley Stage. Pathology from the procedure revealed: invasive lobular carcinoma, 1.1 cm, grade 2; resection margins negative. ER 30% weakly positive, PR 80% moderately positive, Ki-67 of <5%, and Her2 negative. She is scheduled for left sentinel node biopsy on 01/07/21.   Today the patient tenderness from surgery but is overall recovering well. She denies feeling a palpable lump prior to diagnosis but notes tenderness to her breast prior to mammogram.    She has a PMHx of migraines, history of gastric band placement, tonsillectomy, appendectomy, dysrhythmia ("it's an electrical issue, not a heart issue"), thyroid dysfunction, and anxiety.  She  also notes mild anemia since she was a teenager. Her daughter also has mild anemia. [name] has minimal, if any, periods. She was scheduled for  colonoscopy in 09/2020, but this was cancelled due to weather. She uses Xanax as needed. She is on a regimen of medications that controls her migraines well. She reports her eye will twitch with her migraines. She reports she takes gabapentin because she previously got a burn on her breast from falling asleep on an ice pack. She is on thyroid medication for dysfunction, which she has had since her 80's.    Socially, her mother was diagnosed with breast cancer at age 51. She is currently being treated for recurrence. Melaya notes she previously underwent genetic testing, but only for BRCA 1/2 Her father has a history of prostate cancer in his 13's. She drinks alcohol rarely, only socially. She has never smoked.    GYN HISTORY  Menarchal: 50 years old LMP: unsure, many years, maybe at age 73. She has a family history of irregular periods. She notes she has tried different birth control medications to regulate this with no success. She denies any menopausal symptoms. Contraceptive: currently on pills HRT: n/a G2P1, she notes one miscarriage Age at first live birth: 38 years old Her daughter is 60 years old.   REVIEW OF SYSTEMS:    Constitutional: Denies fevers, chills or abnormal night sweats Eyes: Denies blurriness of vision, double vision or watery eyes Ears, nose, mouth, throat, and face: Denies mucositis or sore throat Respiratory: Denies cough, dyspnea or wheezes Cardiovascular: Denies palpitation, chest discomfort or lower extremity swelling Gastrointestinal:  Denies nausea, heartburn or change in bowel habits Skin: Denies abnormal skin rashes Lymphatics: Denies new lymphadenopathy or easy bruising Neurological:Denies numbness, tingling or new weaknesses Behavioral/Psych: Mood is stable, no new changes  All other systems were reviewed with the patient and are negative.   MEDICAL HISTORY:  Past Medical History:  Diagnosis Date  . Allergy   . Anemia    present for many years,  mild, stable  . Anxiety   . Breast cancer ([place])   . Breast cancer ([place]) 12/18/2020  . Dysrhythmia    PAC's on flecanide (did not tolerate beta blockers)  . Frequent headaches   . Hypertension   . Hypothyroidism     SURGICAL HISTORY: Past Surgical History:  Procedure Laterality Date  . APPENDECTOMY  2013  . BREAST LUMPECTOMY WITH RADIOACTIVE SEED LOCALIZATION Left 12/18/2020   Procedure: LEFT BREAST LUMPECTOMY WITH RADIOACTIVE SEED LOCALIZATION;  Surgeon: Erroll Luna, MD;  Location: [facility];  Service: General;  Laterality: Left;  . lapband  2013  . TONSILLECTOMY  2012    SOCIAL HISTORY: Social History   Socioeconomic History  . Marital status: Single    Spouse name: Not on file  . Number of children: 1  . Years of education: Not on file  . Highest education level: Not on file  Occupational History  . Not on file  Tobacco Use  . Smoking status: Never Smoker  . Smokeless tobacco: Never Used  Vaping Use  . Vaping Use: Never used  Substance and Sexual Activity  . Alcohol use: Yes    Comment: occasional  . Drug use: No  . Sexual activity: Yes    Partners: Male    Birth control/protection: Pill  Other Topics Concern  . Not on file  Social History Narrative  . Not on file   Social Determinants of Health   Financial Resource Strain: Not  on file  Food Insecurity: Not on file  Transportation Needs: Not on file  Physical Activity: Not on file  Stress: Not on file  Social Connections: Not on file  Intimate Partner Violence: Not At Risk  . Fear of Current or Ex-Partner: No  . Emotionally Abused: No  . Physically Abused: No  . Sexually Abused: No    FAMILY HISTORY: Family History  Problem Relation Age of Onset  . Cancer Mother 6       breast cancer   . Depression Mother   . Breast cancer Mother   . Cancer Father 71       prostate cancer   . Hyperlipidemia Father     ALLERGIES:  is allergic to ciprofloxacin, keflex [cephalexin], and  trazodone and nefazodone.  MEDICATIONS:  Current Outpatient Medications  Medication Sig Dispense Refill  . ALPRAZolam (XANAX) 0.25 MG tablet Take 1 tablet (0.25 mg total) by mouth 2 (two) times daily as needed. 20 tablet 0  . AMETHIA 0.15-0.03 &0.01 MG tablet Take 1 tablet by mouth daily. 1 Package 0  . butalbital-acetaminophen-caffeine (FIORICET) 50-325-40 MG tablet TAKE 1 TABLET BY MOUTH EVERY 6 (SIX) HOURS AS NEEDED FOR HEADACHE. 20 tablet 2  . cyclobenzaprine (FLEXERIL) 10 MG tablet Take 1 tablet (10 mg total) by mouth daily as needed for muscle spasms. 15 tablet 0  . flecainide (TAMBOCOR) 50 MG tablet TAKE 1 TABLET BY MOUTH TWICE A DAY 180 tablet 1  . fluticasone (FLONASE) 50 MCG/ACT nasal spray SPRAY 2 SPRAYS INTO EACH NOSTRIL EVERY DAY 48 mL 0  . gabapentin (NEURONTIN) 100 MG capsule Take 1 capsule (100 mg total) by mouth 3 (three) times daily. 270 capsule 0  . ibuprofen (ADVIL) 800 MG tablet Take 1 tablet (800 mg total) by mouth every 8 (eight) hours as needed. 30 tablet 0  . levocetirizine (XYZAL) 5 MG tablet TAKE 1 TABLET BY MOUTH EVERY DAY IN THE EVENING 90 tablet 0  . levothyroxine (SYNTHROID) 112 MCG tablet TAKE 1 TABLET (112 MCG TOTAL) BY MOUTH DAILY BEFORE BREAKFAST. 90 tablet 0  . losartan (COZAAR) 25 MG tablet TAKE 1 TABLET BY MOUTH EVERY DAY 90 tablet 3  . losartan (COZAAR) 50 MG tablet Take 1 tablet (50 mg total) by mouth daily.    [name] oxyCODONE (OXY IR/ROXICODONE) 5 MG immediate release tablet Take 1 tablet (5 mg total) by mouth every 6 (six) hours as needed for severe pain. 15 tablet 0  . pravastatin (PRAVACHOL) 40 MG tablet Take 1 tablet (40 mg total) by mouth daily. 90 tablet 0  . Prenatal Vit-Fe Fumarate-FA (PRENATAL MULTIVITAMIN) TABS tablet Take 1 tablet by mouth daily at 12 noon.    . rizatriptan (MAXALT-MLT) 10 MG disintegrating tablet Take 1 tablet (10 mg total) by mouth as needed for migraine. May repeat in 2 hours if needed 10 tablet 1  . sertraline (ZOLOFT) 100 MG  tablet TAKE 1 TABLET BY MOUTH EVERY DAY 90 tablet 0  . SUMAtriptan (IMITREX) 25 MG tablet TAKE 1 TABLET (25 MG TOTAL) BY MOUTH EVERY 2 (TWO) HOURS AS NEEDED FOR MIGRAINE. MAY REPEAT IN 2 HOURS IF HEADACHE PERSISTS OR RECURS. 10 tablet 0  . valACYclovir (VALTREX) 500 MG tablet Take 500 mg by mouth daily.  3   No current facility-administered medications for this visit.    PHYSICAL EXAMINATION: ECOG PERFORMANCE STATUS: 0 - Asymptomatic  Vitals:   01/02/21 1426  BP: (!) 154/89  Pulse: 87  Resp: 18  Temp: (!) 97 F (  36.1 C)  SpO2: 100%   Filed Weights   01/02/21 1426  Weight: 221 lb 6.4 oz (100.4 kg)    GENERAL:alert, no distress and comfortable SKIN: skin color, texture, turgor are normal, no rashes or significant lesions EYES: normal, Conjunctiva are pink and non-injected, sclera clear  NECK: supple, thyroid normal size, non-tender, without nodularity LYMPH:  no palpable lymphadenopathy in the cervical, axillary  LUNGS: clear to auscultation and percussion with normal breathing effort HEART: regular rate & rhythm and no murmurs and no lower extremity edema ABDOMEN:abdomen soft, non-tender and normal bowel sounds Musculoskeletal:no cyanosis of digits and no clubbing  NEURO: alert & oriented x 3 with fluent speech, no focal motor/sensory deficits BREAST: Left breast: scar tissue at incision site, incision healed well. No palpable mass, nodules or adenopathy. Right breast exam benign.  LABORATORY DATA:  I have reviewed the data as listed CBC Latest Ref Rng & Units 12/12/2020 04/25/2020 02/25/2020  WBC 4.0 - 10.5 K/uL 4.9 4.4 6.5  Hemoglobin 12.0 - 15.0 g/dL 11.0(L) 10.1(L) 10.3(L)  Hematocrit 36.0 - 46.0 % 34.2(L) 31.6(L) 31.5(L)  Platelets 150 - 400 K/uL 304 261 310    CMP Latest Ref Rng & Units 12/12/2020 02/25/2020 01/26/2020  Glucose 70 - 99 mg/dL 114(H) 139(H) 96  BUN 6 - 20 mg/dL '14 16 11  ' Creatinine 0.44 - 1.00 mg/dL 1.21(H) 1.10(H) 0.96  Sodium 135 - 145 mmol/L 136 139 143   Potassium 3.5 - 5.1 mmol/L 3.8 3.7 4.3  Chloride 98 - 111 mmol/L 105 109 105  CO2 22 - 32 mmol/L '24 23 23  ' Calcium 8.9 - 10.3 mg/dL 9.1 8.8(L) 9.1  Total Protein 6.5 - 8.1 g/dL 6.7 7.1 -  Total Bilirubin 0.3 - 1.2 mg/dL 0.4 0.3 -  Alkaline Phos 38 - 126 U/L 70 78 -  AST 15 - 41 U/L 15 18 -  ALT 0 - 44 U/L 9 9 -     RADIOGRAPHIC STUDIES: I have personally reviewed the radiological images as listed and agreed with the findings in the report. MM Breast Surgical Specimen  Addendum Date: 01/01/2021   ADDENDUM REPORT: 01/01/2021 14:15 ADDENDUM: Corrected report: Original report contained an error generated by the Voice Recognition software. FINDINGS section should read: Status post surgical excision of the LEFT breast. Electronically Signed   By: Franki Cabot M.D.   On: 01/01/2021 14:15   Result Date: 01/01/2021 CLINICAL DATA:  Status post surgical excision today after earlier radioactive seed localization. EXAM: SPECIMEN RADIOGRAPH OF THE LEFT BREAST COMPARISON:  Previous exam(s). FINDINGS: Status post excision of the bilateral breast. The radioactive seed and biopsy marker clip are present, completely intact, and were marked for pathology. Findings discussed with the OR staff during the procedure. IMPRESSION: Specimen radiograph of the left breast. Electronically Signed: By: Franki Cabot M.D. On: 12/18/2020 16:23   MM DIAG BREAST TOMO UNI RIGHT  Result Date: 12/12/2020 CLINICAL DATA:  50 year old female presenting for screening of the right breast prior 2 planned excisional biopsy of the left breast for a discordant finding. EXAM: DIGITAL DIAGNOSTIC UNILATERAL RIGHT MAMMOGRAM WITH TOMOSYNTHESIS AND CAD TECHNIQUE: Right digital diagnostic mammography and breast tomosynthesis was performed. The images were evaluated with computer-aided detection. COMPARISON:  Previous exam(s). ACR Breast Density Category b: There are scattered areas of fibroglandular density. FINDINGS: No suspicious  calcifications, masses or areas of distortion are seen in the right breast. IMPRESSION: No evidence of right breast malignancy. RECOMMENDATION: Presuming the patient proceeds with surgery as expected, she may  return to routine screening mammography is recommended. The patient will be due for screening in February of 2023. I have discussed the findings and recommendations with the patient. If applicable, a reminder letter will be sent to the patient regarding the next appointment. BI-RADS CATEGORY  1: Negative. Electronically Signed   By: Ammie Ferrier M.D.   On: 12/12/2020 13:03   MM LT RADIOACTIVE SEED LOC MAMMO GUIDE  Result Date: 12/17/2020 CLINICAL DATA:  Status post ultrasound-guided biopsy with a benign pathology result deemed discordant by the performing radiologist with recommendation for surgical excision. Patient is scheduled for surgical excision requiring preoperative radioactive seed localization. EXAM: MAMMOGRAPHIC GUIDED RADIOACTIVE SEED LOCALIZATION OF THE LEFT BREAST COMPARISON:  Previous exam(s). FINDINGS: Patient presents for radioactive seed localization prior to surgical excision. I met with the patient and we discussed the procedure of seed localization including benefits and alternatives. We discussed the high likelihood of a successful procedure. We discussed the risks of the procedure including infection, bleeding, tissue injury and further surgery. We discussed the low dose of radioactivity involved in the procedure. Informed, written consent was given. The usual time-out protocol was performed immediately prior to the procedure. Using mammographic guidance, sterile technique, 1% lidocaine and an I-125 radioactive seed, the medial aspect of the ribbon shaped clip was localized using a lateral approach. The follow-up mammogram images confirm the seed in the expected location and were marked for Dr. Brantley Stage. Follow-up survey of the patient confirms presence of the radioactive seed.  Order number of I-125 seed:  [id number]. Total activity:  8.891 millicuries reference Date: 11/06/2020 The patient tolerated the procedure well and was released from the [facility]. She was given instructions regarding seed removal. IMPRESSION: Radioactive seed localization left breast. No apparent complications. Electronically Signed   By: Franki Cabot M.D.   On: 12/17/2020 14:09    ASSESSMENT & PLAN:  Desyre Calma is a 50 y.o. female with a history of   1. 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 Diagnostic mammogram on 10/28/20 revealed indeterminate left breast mass at 2 o'clock. -Biopsy of the left breast mass on 10/31/20 was again benign but felt to be discordant. -She underwent left lumpectomy under Dr. Brantley Stage on 12/18/2020 . Pathology from surgery showed invasive lobular carcinoma, 1.1 cm, grade 2, negative resection margins. ER weakly positive, PR moderately positive, Her2 negative. Ki-67 of <5%. -She is scheduled for left sentinel node biopsy on 01/07/21 which I agree. -I reviewed her workup thus far. We discussed her imaging and pathology, as well as the natural history of lobular breast cancer and risk of recurrence after surgery. -If her lymph nodes are negative, I recommend a Oncotype Dx test on the surgical sample and we'll make a decision about adjuvant chemotherapy based on the Oncotype result. Written material of this test was given to her. She is young and fit, would be a good candidate for chemotherapy if her Oncotype recurrence score is high (>20). -If her lymph nodes are positive, I recommend mammaprint for further risk stratification and guide adjuvant chemotherapy. If 4 or more nodes are positive, we will do chemotherapy without mammaprint. -Giving the positive ER and PR expression in tumor, I recommend adjuvant endocrine therapy  with tamoxifen or aromatase inhibitor (if she is postmenopausal) for a total of 5-10 years to reduce the risk of cancer recurrence. Potential benefits and side effects were  discussed with patient and she is interested. -She is on oral contraceptives, and has not had menstrual period for years.  I will check [place] and estradiol level on next lab appointment -I recommended she stop her oral contraceptives. She can switch to nonestrogen containing IUD. She will discuss this with her GYN. -She was also seen by radiation oncologist Dr. Lisbeth Renshaw today.  -We also discussed the breast cancer surveillance after her surgery. She will undergo annual diagnostic mammogram for the first 5 years, then back to screening mammography following. She will continue self exam and a routine office visit with lab and exam with [place]. -I encouraged her to have healthy diet and exercise regularly.  -Due to her lobular histology, I discussed the role of breast MRI. Since we are not able to get it done before her surgery, will do screening MRI in August   2. Mild anemia -Hgb from 12/12/20 was 11. -She notes she has been anemic since she was a teenager. It has always been mild and stable. She has very irregular periods, often going months between them. Her last period was around age 61, she thinks. -I will obtain ferritin and iron labs to further evaluate. -I recommended she reach out to her GI doctor to reschedule her previously cancelled colonoscopy to rule out GI bleed.   PLAN:  -Proceed with sentinel lymph node biopsy as scheduled (on 01/07/21 with Dr. Brantley Stage) -F/u with GYN regarding IUD. -We will obtain labs to determine if she is postmenopausal -F/u with GI to reschedule colonoscopy -We will also obtain iron workup to further evaluate her anemia -Oncotype on her surgical sample if node negative, or MammaPrint if node positive -genetic referral and lab  -I will see her back after breast radiation, or sooner if she has high risk  disease.   Orders Placed This Encounter  Procedures  . CBC with Differential (Cancer Center Only)    Standing Status:   Standing    Number of Occurrences:   10    Standing Expiration Date:   01/02/2022  . CMP ([facility] only)    Standing Status:   Standing    Number of Occurrences:   10    Standing Expiration Date:   01/02/2022  . FSH-Follicle stimulating hormone    Standing Status:   Future    Standing Expiration Date:   01/02/2022  . Estradiol, Sensitive    Standing Status:   Future    Standing Expiration Date:   01/02/2022  . Ferritin    Standing Status:   Future    Standing Expiration Date:   01/02/2022  . Retic Panel    Standing Status:   Future    Standing Expiration Date:   01/02/2022  . Iron and TIBC    Standing Status:   Future    Standing Expiration Date:   01/02/2022  . Methylmalonic acid, serum    Standing Status:   Future    Standing Expiration Date:   01/02/2022  . Vitamin B12    Standing Status:   Future    Standing Expiration Date:   01/02/2022  . Ambulatory referral to Genetics    Referral Priority:   Routine    Referral Type:   Consultation    Referral Reason:   Specialty Services Required    Number of Visits Requested:   1    All questions were answered. The patient knows to call the clinic with any problems, questions or concerns. The total time spent in the  appointment was 60 minutes.     Truitt Merle, MD 01/02/2021   I, Wilburn Mylar, am acting as scribe for Truitt Merle, MD.   I have reviewed the above documentation for accuracy and completeness, and I agree with the above.

## 2021-01-02 ENCOUNTER — Encounter: Payer: Self-pay | Admitting: Hematology

## 2021-01-02 ENCOUNTER — Ambulatory Visit
Admission: RE | Admit: 2021-01-02 | Discharge: 2021-01-02 | Disposition: A | Discharge status: Home / Self Care | Payer: BC Managed Care – PPO | Source: Ambulatory Visit | Attending: Radiation Oncology | Admitting: Radiation Oncology

## 2021-01-02 ENCOUNTER — Inpatient Hospital Stay: Payer: BC Managed Care – PPO | Attending: Hematology | Admitting: Hematology

## 2021-01-02 ENCOUNTER — Telehealth: Payer: Self-pay | Admitting: Hematology

## 2021-01-02 ENCOUNTER — Encounter: Payer: Self-pay | Admitting: Radiation Oncology

## 2021-01-02 ENCOUNTER — Other Ambulatory Visit: Payer: Self-pay

## 2021-01-02 VITALS — BP 154/89 | HR 87 | Temp 97.0°F | Resp 18 | Ht 65.0 in | Wt 221.4 lb

## 2021-01-02 DIAGNOSIS — C50412 Malignant neoplasm of upper-outer quadrant of left female breast: Secondary | ICD-10-CM

## 2021-01-02 DIAGNOSIS — Z9884 Bariatric surgery status: Secondary | ICD-10-CM | POA: Diagnosis not present

## 2021-01-02 DIAGNOSIS — Z170 Estrogen receptor positive status [ER+]: Secondary | ICD-10-CM | POA: Insufficient documentation

## 2021-01-02 DIAGNOSIS — Z8042 Family history of malignant neoplasm of prostate: Secondary | ICD-10-CM | POA: Insufficient documentation

## 2021-01-02 DIAGNOSIS — Z79899 Other long term (current) drug therapy: Secondary | ICD-10-CM | POA: Insufficient documentation

## 2021-01-02 DIAGNOSIS — I10 Essential (primary) hypertension: Secondary | ICD-10-CM | POA: Insufficient documentation

## 2021-01-02 DIAGNOSIS — Z803 Family history of malignant neoplasm of breast: Secondary | ICD-10-CM | POA: Diagnosis not present

## 2021-01-02 DIAGNOSIS — D649 Anemia, unspecified: Secondary | ICD-10-CM

## 2021-01-02 DIAGNOSIS — E039 Hypothyroidism, unspecified: Secondary | ICD-10-CM | POA: Diagnosis not present

## 2021-01-02 DIAGNOSIS — Z853 Personal history of malignant neoplasm of breast: Secondary | ICD-10-CM | POA: Insufficient documentation

## 2021-01-02 DIAGNOSIS — D519 Vitamin B12 deficiency anemia, unspecified: Secondary | ICD-10-CM

## 2021-01-02 DIAGNOSIS — D509 Iron deficiency anemia, unspecified: Secondary | ICD-10-CM

## 2021-01-02 NOTE — Telephone Encounter (Signed)
Scheduled follow-up appointment per 4/28 los. Patient is aware. 

## 2021-01-02 NOTE — Progress Notes (Signed)
Radiation Oncology         [phone] ________________________________  Name: [name]        MRN: [id number]  Date of Service: 01/02/2021 DOB: 04/25/1971  AT:[facility], Coralie Keens, NP  Erroll Luna, MD     REFERRING PHYSICIAN: Erroll Luna, MD   DIAGNOSIS: The encounter diagnosis was Carcinoma of upper-outer quadrant of left breast in female, estrogen receptor positive ([place]).   HISTORY OF PRESENT ILLNESS: [name] is a 50 y.o. female seen at the request of Dr. Brantley Stage for a new diagnosis of left breast cancer.  She was being followed for fibrocystic changes and diagnostic imaging of the left breast in February 2022 showed a mass in the 2 o'clock position of the left breast measuring up to 1.1 cm no evidence of adenopathy was identified.  A biopsy on 10/31/2020 showed fibroadipose tissue with minimal breast parenchyma demonstrating mild fibrocystic change to include apocrine metaplasia.  She return for imaging of the right breast which did not see any evidence of abnormality.  She was offered excision of the left breast finding and underwent a left lumpectomy which revealed a grade 2 invasive lobular carcinoma measuring 1.1 cm.  Resection margins were negative for carcinoma but the inferior margin was less than 1 mm from carcinoma in the medial was 1 mm from carcinoma.  Biopsy related changes were seen and background fibrocystic changes also noted.  Her cancer was ER positive with weak staining, PR positive, HER2 negative with a Ki-67 of <5%. She is scheduled for left sentinel node biopsy on 01/07/21. She's seen today to discuss adjuvant treatments.     PREVIOUS RADIATION THERAPY: No   PAST MEDICAL HISTORY:  Past Medical History:  Diagnosis Date  . Allergy   . Anemia    present for many years, mild, stable  . Anxiety   . Breast cancer ([place])   . Breast cancer ([place]) 12/18/2020  . Dysrhythmia    PAC's on flecanide (did not tolerate beta blockers)  . Frequent headaches    . Hypertension   . Hypothyroidism        PAST SURGICAL HISTORY: Past Surgical History:  Procedure Laterality Date  . APPENDECTOMY  2013  . BREAST LUMPECTOMY WITH RADIOACTIVE SEED LOCALIZATION Left 12/18/2020   Procedure: LEFT BREAST LUMPECTOMY WITH RADIOACTIVE SEED LOCALIZATION;  Surgeon: Erroll Luna, MD;  Location: [facility];  Service: General;  Laterality: Left;  . lapband  2013  . TONSILLECTOMY  2012     FAMILY HISTORY:  Family History  Problem Relation Age of Onset  . Cancer Mother 83       breast cancer   . Depression Mother   . Breast cancer Mother   . Cancer Father 32       prostate cancer   . Hyperlipidemia Father      SOCIAL HISTORY:  reports that she has never smoked. She has never used smokeless tobacco. She reports current alcohol use. She reports that she does not use drugs. The patient is single and lives in [place]. 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    ALLERGIES: Ciprofloxacin, Keflex [cephalexin], and Trazodone and nefazodone   MEDICATIONS:  Current Outpatient Medications  Medication Sig Dispense Refill  . ALPRAZolam (XANAX) 0.25 MG tablet Take 1 tablet (0.25 mg total) by mouth 2 (two) times daily as needed. 20 tablet 0  . AMETHIA 0.15-0.03 &0.01 MG tablet Take 1 tablet by mouth daily. 1 Package 0  . butalbital-acetaminophen-caffeine (  FIORICET) 50-325-40 MG tablet TAKE 1 TABLET BY MOUTH EVERY 6 (SIX) HOURS AS NEEDED FOR HEADACHE. 20 tablet 2  . cyclobenzaprine (FLEXERIL) 10 MG tablet Take 1 tablet (10 mg total) by mouth daily as needed for muscle spasms. 15 tablet 0  . flecainide (TAMBOCOR) 50 MG tablet TAKE 1 TABLET BY MOUTH TWICE A DAY 180 tablet 1  . fluticasone (FLONASE) 50 MCG/ACT nasal spray SPRAY 2 SPRAYS INTO EACH NOSTRIL EVERY DAY 48 mL 0  . gabapentin (NEURONTIN) 100 MG capsule Take 1 capsule (100 mg total) by mouth 3 (three) times daily. 270 capsule 0  . ibuprofen (ADVIL) 800 MG tablet Take 1  tablet (800 mg total) by mouth every 8 (eight) hours as needed. 30 tablet 0  . levocetirizine (XYZAL) 5 MG tablet TAKE 1 TABLET BY MOUTH EVERY DAY IN THE EVENING 90 tablet 0  . levothyroxine (SYNTHROID) 112 MCG tablet TAKE 1 TABLET (112 MCG TOTAL) BY MOUTH DAILY BEFORE BREAKFAST. 90 tablet 0  . losartan (COZAAR) 25 MG tablet TAKE 1 TABLET BY MOUTH EVERY DAY 90 tablet 3  . oxyCODONE (OXY IR/ROXICODONE) 5 MG immediate release tablet Take 1 tablet (5 mg total) by mouth every 6 (six) hours as needed for severe pain. 15 tablet 0  . pravastatin (PRAVACHOL) 40 MG tablet Take 1 tablet (40 mg total) by mouth daily. 90 tablet 0  . Prenatal Vit-Fe Fumarate-FA (PRENATAL MULTIVITAMIN) TABS tablet Take 1 tablet by mouth daily at 12 noon.    . rizatriptan (MAXALT-MLT) 10 MG disintegrating tablet Take 1 tablet (10 mg total) by mouth as needed for migraine. May repeat in 2 hours if needed 10 tablet 1  . sertraline (ZOLOFT) 100 MG tablet TAKE 1 TABLET BY MOUTH EVERY DAY 90 tablet 0  . SUMAtriptan (IMITREX) 25 MG tablet TAKE 1 TABLET (25 MG TOTAL) BY MOUTH EVERY 2 (TWO) HOURS AS NEEDED FOR MIGRAINE. MAY REPEAT IN 2 HOURS IF HEADACHE PERSISTS OR RECURS. 10 tablet 0  . valACYclovir (VALTREX) 500 MG tablet Take 500 mg by mouth daily.  3  . losartan (COZAAR) 50 MG tablet Take 1 tablet (50 mg total) by mouth daily.     No current facility-administered medications for this encounter.     REVIEW OF SYSTEMS: On review of systems, the patient reports that she is doing well overall. She denies any specific breast concerns.  PHYSICAL EXAM:  Wt Readings from Last 3 Encounters:  01/02/21 220 lb 9.6 oz (100.1 kg)  01/02/21 221 lb 6.4 oz (100.4 kg)  12/18/20 220 lb 14.4 oz (100.2 kg)   Temp Readings from Last 3 Encounters:  01/02/21 98 F (36.7 C)  01/02/21 (!) 97 F (36.1 C)  12/18/20 97.6 F (36.4 C)   BP Readings from Last 3 Encounters:  01/02/21 131/79  01/02/21 (!) 154/89  12/18/20 (!) 151/67   Pulse  Readings from Last 3 Encounters:  01/02/21 87  01/02/21 87  12/18/20 74    In general this is a well appearing caucasian female in no acute distress. She's alert and oriented x4 and appropriate throughout the examination. Cardiopulmonary assessment is negative for acute distress and she exhibits normal effort. Bilateral breast exam is deferred.    ECOG = 0  0 - Asymptomatic (Fully active, able to carry on all predisease activities without restriction)  1 - Symptomatic but completely ambulatory (Restricted in physically strenuous activity but ambulatory and able to carry out work of a light or sedentary nature. For example, light housework, office work)  2 - Symptomatic, <50% in bed during the day (Ambulatory and capable of all self care but unable to carry out any work activities. Up and about more than 50% of waking hours)  3 - Symptomatic, >50% in bed, but not bedbound (Capable of only limited self-care, confined to bed or chair 50% or more of waking hours)  4 - Bedbound (Completely disabled. Cannot carry on any self-care. Totally confined to bed or chair)  5 - Death   Eustace Pen MM, Creech RH, Tormey DC, et al. [phone]). "Toxicity and response criteria of the [facility] Group". [place] Oncol. 5 (6): 649-55    LABORATORY DATA:  Lab Results  Component Value Date   WBC 4.9 12/12/2020   HGB 11.0 (L) 12/12/2020   HCT 34.2 (L) 12/12/2020   MCV 79.0 (L) 12/12/2020   PLT 304 12/12/2020   Lab Results  Component Value Date   NA 136 12/12/2020   K 3.8 12/12/2020   CL 105 12/12/2020   CO2 24 12/12/2020   Lab Results  Component Value Date   ALT 9 12/12/2020   AST 15 12/12/2020   ALKPHOS 70 12/12/2020   BILITOT 0.4 12/12/2020      RADIOGRAPHY: MM Breast Surgical Specimen  Addendum Date: 01/01/2021   ADDENDUM REPORT: 01/01/2021 14:15 ADDENDUM: Corrected report: Original report contained an error generated by the Voice Recognition software. FINDINGS section  should read: Status post surgical excision of the LEFT breast. Electronically Signed   By: Franki Cabot M.D.   On: 01/01/2021 14:15   Result Date: 01/01/2021 CLINICAL DATA:  Status post surgical excision today after earlier radioactive seed localization. EXAM: SPECIMEN RADIOGRAPH OF THE LEFT BREAST COMPARISON:  Previous exam(s). FINDINGS: Status post excision of the bilateral breast. The radioactive seed and biopsy marker clip are present, completely intact, and were marked for pathology. Findings discussed with the OR staff during the procedure. IMPRESSION: Specimen radiograph of the left breast. Electronically Signed: By: Franki Cabot M.D. On: 12/18/2020 16:23   MM DIAG BREAST TOMO UNI RIGHT  Result Date: 12/12/2020 CLINICAL DATA:  50 year old female presenting for screening of the right breast prior 2 planned excisional biopsy of the left breast for a discordant finding. EXAM: DIGITAL DIAGNOSTIC UNILATERAL RIGHT MAMMOGRAM WITH TOMOSYNTHESIS AND CAD TECHNIQUE: Right digital diagnostic mammography and breast tomosynthesis was performed. The images were evaluated with computer-aided detection. COMPARISON:  Previous exam(s). ACR Breast Density Category b: There are scattered areas of fibroglandular density. FINDINGS: No suspicious calcifications, masses or areas of distortion are seen in the right breast. IMPRESSION: No evidence of right breast malignancy. RECOMMENDATION: Presuming the patient proceeds with surgery as expected, she may return to routine screening mammography is recommended. The patient will be due for screening in February of 2023. I have discussed the findings and recommendations with the patient. If applicable, a reminder letter will be sent to the patient regarding the next appointment. BI-RADS CATEGORY  1: Negative. Electronically Signed   By: Ammie Ferrier M.D.   On: 12/12/2020 13:03   MM LT RADIOACTIVE SEED LOC MAMMO GUIDE  Result Date: 12/17/2020 CLINICAL DATA:  Status post  ultrasound-guided biopsy with a benign pathology result deemed discordant by the performing radiologist with recommendation for surgical excision. Patient is scheduled for surgical excision requiring preoperative radioactive seed localization. EXAM: MAMMOGRAPHIC GUIDED RADIOACTIVE SEED LOCALIZATION OF THE LEFT BREAST COMPARISON:  Previous exam(s). FINDINGS: Patient presents for radioactive seed localization prior to surgical excision. I met with the patient and we discussed  the procedure of seed localization including benefits and alternatives. We discussed the high likelihood of a successful procedure. We discussed the risks of the procedure including infection, bleeding, tissue injury and further surgery. We discussed the low dose of radioactivity involved in the procedure. Informed, written consent was given. The usual time-out protocol was performed immediately prior to the procedure. Using mammographic guidance, sterile technique, 1% lidocaine and an I-125 radioactive seed, the medial aspect of the ribbon shaped clip was localized using a lateral approach. The follow-up mammogram images confirm the seed in the expected location and were marked for Dr. Brantley Stage. Follow-up survey of the patient confirms presence of the radioactive seed. Order number of I-125 seed:  [id number]. Total activity:  6.440 millicuries reference Date: 11/06/2020 The patient tolerated the procedure well and was released from the [place]. She was given instructions regarding seed removal. IMPRESSION: Radioactive seed localization left breast. No apparent complications. Electronically Signed   By: Franki Cabot M.D.   On: 12/17/2020 14:09       IMPRESSION/PLAN: 1. Stage IA, [id number] grade 2 invasive lobular carcinoma of the left breast. Dr. Lisbeth Renshaw discusses the pathology findings and reviews the nature of early stage breast disease. Dr. Lisbeth Renshaw agrees with proceed with sentinel node biopsy. Dr. Burr Medico may recommend Oncotype Dx score to  determine a role for systemic therapy. Provided that chemotherapy is not indicated, the patient's course would then be followed by external radiotherapy to the breast  to reduce risks of local recurrence followed by antiestrogen therapy. We discussed the risks, benefits, short, and long term effects of radiotherapy, as well as the curative intent, and the patient is interested in proceeding. Dr. Lisbeth Renshaw discusses the delivery and logistics of radiotherapy and anticipates a course of 6 1/2 weeks of radiotherapy. We will see her back a few weeks after surgery to proceed with the simulation process and anticipate we starting radiotherapy about 4-6 weeks after surgery.    In a visit lasting 60 minutes, greater than 50% of the time was spent face to face with Dr. Lisbeth Renshaw, and myself (Via Webex) reviewing her case, as well as in preparation of, discussing, and coordinating the patient's care.  The above documentation reflects my direct findings during this shared patient visit. Please see the separate note by Dr. Lisbeth Renshaw on this date for the remainder of the patient's plan of care.    Carola Rhine, [facility]    **Disclaimer: This note was dictated with voice recognition software. Similar sounding words can inadvertently be transcribed and this note may contain transcription errors which may not have been corrected upon publication of note.**

## 2021-01-02 NOTE — Progress Notes (Signed)
New Breast Cancer Diagnosis: Left Breast    Did patient present with symptoms (if so, please note symptoms) or screening mammography?:Screening Mass    Location and Extent of disease :left breast. Located at 2 o'clock position, measured  1.1 cm in greatest dimension. Adenopathy no.  Histology per Pathology Report: grade 2, Invasive Lobular Carcinoma  Left Breast 12/18/2020  Receptor Status: ER(positive), PR (positive), Her2-neu (negative), Ki-(<5%)  Surgeon and surgical plan, if any: Dr. Brantley Stage -Left Breast Lumpectomy with radioactive seed localization 12/18/2020 -Left SLN Biopsy 01/07/2021  Medical oncologist, treatment if any:   Dr. Burr Medico 01/02/2021 3 pm   Family History of Breast/Ovarian/Prostate Cancer:   Lymphedema issues, if any:  No  Pain issues, if any:  Has some tenderness at biopsy site.  SAFETY ISSUES: Prior radiation? No Pacemaker/ICD?  No Possible current pregnancy? Oral Contraceptives Is the patient on methotrexate? No  Current Complaints / other details:

## 2021-01-03 ENCOUNTER — Other Ambulatory Visit: Payer: Self-pay

## 2021-01-03 ENCOUNTER — Other Ambulatory Visit
Admission: RE | Admit: 2021-01-03 | Discharge: 2021-01-03 | Disposition: A | Discharge status: Home / Self Care | Payer: BC Managed Care – PPO | Source: Ambulatory Visit | Attending: Surgery | Admitting: Surgery

## 2021-01-03 DIAGNOSIS — Z01812 Encounter for preprocedural laboratory examination: Secondary | ICD-10-CM | POA: Insufficient documentation

## 2021-01-03 DIAGNOSIS — Z20822 Contact with and (suspected) exposure to covid-19: Secondary | ICD-10-CM | POA: Insufficient documentation

## 2021-01-04 LAB — SARS CORONAVIRUS 2 (TAT 6-24 HRS): SARS Coronavirus 2: NEGATIVE

## 2021-01-05 ENCOUNTER — Encounter: Payer: Self-pay | Admitting: Hematology

## 2021-01-06 ENCOUNTER — Other Ambulatory Visit: Payer: Self-pay

## 2021-01-06 ENCOUNTER — Encounter (HOSPITAL_COMMUNITY): Payer: Self-pay | Admitting: Surgery

## 2021-01-06 NOTE — Progress Notes (Signed)
Patient denies shortness of breath, fever, cough or chest pain.  PCP - Webb Silversmith, NP Cardiologist - Dr Virl Axe Oncology - Dr Phylliss Bob  Chest x-ray - n/a EKG - 10/31/20 Stress Test - 01/09/20 ECHO - 06/13/19 Cardiac Cath - n/a  ERAS: Clear liquids til 6:30 am on DOS.  Anesthesia review: Yes  STOP now taking any Aspirin (unless otherwise instructed by your surgeon), Aleve, Naproxen, Ibuprofen, Motrin, Advil, Goody's, BC's, all herbal medications, fish oil, and all vitamins.   Coronavirus Screening Covid test on 01/03/21 was negative.  Patient verbalized understanding of instructions that were given via phone.

## 2021-01-07 ENCOUNTER — Ambulatory Visit (HOSPITAL_COMMUNITY)
Admission: RE | Admit: 2021-01-07 | Discharge: 2021-01-07 | Disposition: A | Discharge status: Home / Self Care | Payer: BC Managed Care – PPO | Source: Ambulatory Visit | Attending: Surgery | Admitting: Surgery

## 2021-01-07 ENCOUNTER — Encounter (HOSPITAL_COMMUNITY)
Admission: RE | Disposition: A | Discharge status: Home / Self Care | Payer: Self-pay | Source: Home / Self Care | Attending: Surgery

## 2021-01-07 ENCOUNTER — Ambulatory Visit (HOSPITAL_COMMUNITY): Payer: BC Managed Care – PPO

## 2021-01-07 ENCOUNTER — Ambulatory Visit (HOSPITAL_COMMUNITY): Payer: BC Managed Care – PPO | Admitting: Anesthesiology

## 2021-01-07 ENCOUNTER — Ambulatory Visit (HOSPITAL_COMMUNITY)
Admission: RE | Admit: 2021-01-07 | Discharge: 2021-01-07 | Disposition: A | Discharge status: Home / Self Care | Payer: BC Managed Care – PPO | Attending: Surgery | Admitting: Surgery

## 2021-01-07 ENCOUNTER — Encounter (HOSPITAL_COMMUNITY): Payer: Self-pay | Admitting: Surgery

## 2021-01-07 ENCOUNTER — Encounter: Payer: Self-pay | Admitting: *Deleted

## 2021-01-07 DIAGNOSIS — Z803 Family history of malignant neoplasm of breast: Secondary | ICD-10-CM | POA: Diagnosis not present

## 2021-01-07 DIAGNOSIS — Z8349 Family history of other endocrine, nutritional and metabolic diseases: Secondary | ICD-10-CM | POA: Insufficient documentation

## 2021-01-07 DIAGNOSIS — C50912 Malignant neoplasm of unspecified site of left female breast: Secondary | ICD-10-CM

## 2021-01-07 DIAGNOSIS — I10 Essential (primary) hypertension: Secondary | ICD-10-CM | POA: Diagnosis not present

## 2021-01-07 DIAGNOSIS — Z885 Allergy status to narcotic agent status: Secondary | ICD-10-CM | POA: Insufficient documentation

## 2021-01-07 DIAGNOSIS — Z79899 Other long term (current) drug therapy: Secondary | ICD-10-CM | POA: Insufficient documentation

## 2021-01-07 DIAGNOSIS — Z881 Allergy status to other antibiotic agents status: Secondary | ICD-10-CM | POA: Insufficient documentation

## 2021-01-07 DIAGNOSIS — Z8042 Family history of malignant neoplasm of prostate: Secondary | ICD-10-CM | POA: Insufficient documentation

## 2021-01-07 DIAGNOSIS — Z170 Estrogen receptor positive status [ER+]: Secondary | ICD-10-CM

## 2021-01-07 DIAGNOSIS — C50412 Malignant neoplasm of upper-outer quadrant of left female breast: Secondary | ICD-10-CM | POA: Insufficient documentation

## 2021-01-07 HISTORY — DX: Herpesviral infection, unspecified: B00.9

## 2021-01-07 HISTORY — PX: SENTINEL NODE BIOPSY: SHX6608

## 2021-01-07 LAB — CBC WITH DIFFERENTIAL/PLATELET
Abs Immature Granulocytes: 0.01 10*3/uL (ref 0.00–0.07)
Basophils Absolute: 0 10*3/uL (ref 0.0–0.1)
Basophils Relative: 1 %
Eosinophils Absolute: 0.1 10*3/uL (ref 0.0–0.5)
Eosinophils Relative: 1 %
HCT: 30.3 % — ABNORMAL LOW (ref 36.0–46.0)
Hemoglobin: 9.5 g/dL — ABNORMAL LOW (ref 12.0–15.0)
Immature Granulocytes: 0 %
Lymphocytes Relative: 31 %
Lymphs Abs: 1.9 10*3/uL (ref 0.7–4.0)
MCH: 24.7 pg — ABNORMAL LOW (ref 26.0–34.0)
MCHC: 31.4 g/dL (ref 30.0–36.0)
MCV: 78.9 fL — ABNORMAL LOW (ref 80.0–100.0)
Monocytes Absolute: 0.4 10*3/uL (ref 0.1–1.0)
Monocytes Relative: 7 %
Neutro Abs: 3.6 10*3/uL (ref 1.7–7.7)
Neutrophils Relative %: 60 %
Platelets: 281 10*3/uL (ref 150–400)
RBC: 3.84 MIL/uL — ABNORMAL LOW (ref 3.87–5.11)
RDW: 15.9 % — ABNORMAL HIGH (ref 11.5–15.5)
WBC: 6 10*3/uL (ref 4.0–10.5)
nRBC: 0 % (ref 0.0–0.2)

## 2021-01-07 LAB — COMPREHENSIVE METABOLIC PANEL
ALT: 9 U/L (ref 0–44)
AST: 15 U/L (ref 15–41)
Albumin: 3.2 g/dL — ABNORMAL LOW (ref 3.5–5.0)
Alkaline Phosphatase: 92 U/L (ref 38–126)
Anion gap: 8 (ref 5–15)
BUN: 10 mg/dL (ref 6–20)
CO2: 26 mmol/L (ref 22–32)
Calcium: 8.4 mg/dL — ABNORMAL LOW (ref 8.9–10.3)
Chloride: 100 mmol/L (ref 98–111)
Creatinine, Ser: 1.21 mg/dL — ABNORMAL HIGH (ref 0.44–1.00)
GFR, Estimated: 55 mL/min — ABNORMAL LOW (ref >60)
Glucose, Bld: 94 mg/dL (ref 70–99)
Potassium: 3.7 mmol/L (ref 3.5–5.1)
Sodium: 134 mmol/L — ABNORMAL LOW (ref 135–145)
Total Bilirubin: 0.3 mg/dL (ref 0.3–1.2)
Total Protein: 6.4 g/dL — ABNORMAL LOW (ref 6.5–8.1)

## 2021-01-07 LAB — POCT PREGNANCY, URINE: Preg Test, Ur: NEGATIVE

## 2021-01-07 SURGERY — BIOPSY, LYMPH NODE, SENTINEL
Anesthesia: General | Site: Breast | Laterality: Left

## 2021-01-07 MED ORDER — CLINDAMYCIN PHOSPHATE 900 MG/50ML IV SOLN
900.0000 mg | INTRAVENOUS | Status: AC
  Administered 2021-01-07: 900 mg via INTRAVENOUS

## 2021-01-07 MED ORDER — METHYLENE BLUE 0.5 % INJ SOLN
INTRAVENOUS | Status: AC
  Filled 2021-01-07: qty 10

## 2021-01-07 MED ORDER — CHLORHEXIDINE GLUCONATE 0.12 % MT SOLN
OROMUCOSAL | Status: AC
  Administered 2021-01-07: 15 mL
  Filled 2021-01-07: qty 15

## 2021-01-07 MED ORDER — PROPOFOL 10 MG/ML IV BOLUS
INTRAVENOUS | Status: AC
  Filled 2021-01-07: qty 20

## 2021-01-07 MED ORDER — LIDOCAINE 2% (20 MG/ML) 5 ML SYRINGE
INTRAMUSCULAR | Status: DC | PRN
  Administered 2021-01-07: 80 mg via INTRAVENOUS

## 2021-01-07 MED ORDER — DEXMEDETOMIDINE (PRECEDEX) IN NS 20 MCG/5ML (4 MCG/ML) IV SYRINGE
PREFILLED_SYRINGE | INTRAVENOUS | Status: DC | PRN
  Administered 2021-01-07: 8 ug via INTRAVENOUS

## 2021-01-07 MED ORDER — ACETAMINOPHEN 500 MG PO TABS
ORAL_TABLET | ORAL | Status: AC
  Administered 2021-01-07: 1000 mg via ORAL
  Filled 2021-01-07: qty 2

## 2021-01-07 MED ORDER — PROMETHAZINE HCL 25 MG/ML IJ SOLN: 6.2500 mg | INTRAMUSCULAR | Status: DC | PRN

## 2021-01-07 MED ORDER — OXYCODONE HCL 5 MG/5ML PO SOLN: 5.0000 mg | Freq: Once | ORAL | Status: DC | PRN

## 2021-01-07 MED ORDER — FENTANYL CITRATE (PF) 100 MCG/2ML IJ SOLN
INTRAMUSCULAR | Status: AC
  Filled 2021-01-07: qty 2

## 2021-01-07 MED ORDER — FENTANYL CITRATE (PF) 100 MCG/2ML IJ SOLN
100.0000 ug | Freq: Once | INTRAMUSCULAR | Status: AC
Start: 2021-01-07 — End: 2021-01-07
  Administered 2021-01-07: 100 ug via INTRAVENOUS
  Filled 2021-01-07: qty 2

## 2021-01-07 MED ORDER — TECHNETIUM TC 99M TILMANOCEPT KIT
1.0000 | PACK | Freq: Once | INTRAVENOUS | Status: AC | PRN
  Administered 2021-01-07: 1 via INTRADERMAL

## 2021-01-07 MED ORDER — CHLORHEXIDINE GLUCONATE CLOTH 2 % EX PADS: 6.0000 | MEDICATED_PAD | Freq: Once | CUTANEOUS | Status: DC

## 2021-01-07 MED ORDER — CLINDAMYCIN PHOSPHATE 900 MG/50ML IV SOLN
INTRAVENOUS | Status: AC
  Filled 2021-01-07: qty 50

## 2021-01-07 MED ORDER — SODIUM CHLORIDE (PF) 0.9 % IJ SOLN
INTRAVENOUS | Status: DC | PRN
  Administered 2021-01-07: 4 mL

## 2021-01-07 MED ORDER — FENTANYL CITRATE (PF) 100 MCG/2ML IJ SOLN
25.0000 ug | INTRAMUSCULAR | Status: DC | PRN
  Administered 2021-01-07 (×2): 50 ug via INTRAVENOUS

## 2021-01-07 MED ORDER — IBUPROFEN 800 MG PO TABS: 800.0000 mg | ORAL_TABLET | Freq: Three times a day (TID) | ORAL | 0 refills | Status: DC | PRN

## 2021-01-07 MED ORDER — ONDANSETRON HCL 4 MG/2ML IJ SOLN
INTRAMUSCULAR | Status: DC | PRN
  Administered 2021-01-07: 4 mg via INTRAVENOUS

## 2021-01-07 MED ORDER — SCOPOLAMINE 1 MG/3DAYS TD PT72: 1.0000 | MEDICATED_PATCH | TRANSDERMAL | Status: DC

## 2021-01-07 MED ORDER — BUPIVACAINE-EPINEPHRINE (PF) 0.25% -1:200000 IJ SOLN
INTRAMUSCULAR | Status: AC
  Filled 2021-01-07: qty 30

## 2021-01-07 MED ORDER — LACTATED RINGERS IV SOLN: INTRAVENOUS | Status: DC

## 2021-01-07 MED ORDER — BUPIVACAINE-EPINEPHRINE 0.25% -1:200000 IJ SOLN
INTRAMUSCULAR | Status: DC | PRN
  Administered 2021-01-07: 20 mL

## 2021-01-07 MED ORDER — OXYCODONE HCL 5 MG PO TABS: 5.0000 mg | ORAL_TABLET | Freq: Once | ORAL | Status: DC | PRN

## 2021-01-07 MED ORDER — LIDOCAINE 2% (20 MG/ML) 5 ML SYRINGE
INTRAMUSCULAR | Status: AC
  Filled 2021-01-07: qty 5

## 2021-01-07 MED ORDER — MIDAZOLAM HCL 2 MG/2ML IJ SOLN
INTRAMUSCULAR | Status: AC
  Filled 2021-01-07: qty 2

## 2021-01-07 MED ORDER — ACETAMINOPHEN 500 MG PO TABS: 1000.0000 mg | ORAL_TABLET | ORAL | Status: AC

## 2021-01-07 MED ORDER — MIDAZOLAM HCL 2 MG/2ML IJ SOLN
INTRAMUSCULAR | Status: DC | PRN
  Administered 2021-01-07: 2 mg via INTRAVENOUS

## 2021-01-07 MED ORDER — SODIUM CHLORIDE (PF) 0.9 % IJ SOLN
INTRAMUSCULAR | Status: AC
  Filled 2021-01-07: qty 10

## 2021-01-07 MED ORDER — OXYCODONE HCL 5 MG PO TABS: 5.0000 mg | ORAL_TABLET | Freq: Four times a day (QID) | ORAL | 0 refills | Status: DC | PRN

## 2021-01-07 MED ORDER — PROPOFOL 10 MG/ML IV BOLUS
INTRAVENOUS | Status: DC | PRN
  Administered 2021-01-07: 200 mg via INTRAVENOUS

## 2021-01-07 MED ORDER — SCOPOLAMINE 1 MG/3DAYS TD PT72
MEDICATED_PATCH | TRANSDERMAL | Status: AC
  Administered 2021-01-07: 1.5 mg via TRANSDERMAL
  Filled 2021-01-07: qty 1

## 2021-01-07 MED ORDER — DEXAMETHASONE SODIUM PHOSPHATE 10 MG/ML IJ SOLN
INTRAMUSCULAR | Status: DC | PRN
  Administered 2021-01-07: 5 mg via INTRAVENOUS

## 2021-01-07 SURGICAL SUPPLY — 35 items
CANISTER SUCT 3000ML PPV (MISCELLANEOUS) IMPLANT
CHLORAPREP W/TINT 26 (MISCELLANEOUS) ×2 IMPLANT
CNTNR URN SCR LID CUP LEK RST (MISCELLANEOUS) IMPLANT
CONT SPEC 4OZ STRL OR WHT (MISCELLANEOUS)
COVER SURGICAL LIGHT HANDLE (MISCELLANEOUS) ×2 IMPLANT
COVER WAND RF STERILE (DRAPES) ×2 IMPLANT
DECANTER SPIKE VIAL GLASS SM (MISCELLANEOUS) ×2 IMPLANT
DERMABOND ADVANCED (GAUZE/BANDAGES/DRESSINGS) ×1
DERMABOND ADVANCED .7 DNX12 (GAUZE/BANDAGES/DRESSINGS) ×1 IMPLANT
DRAPE LAPAROTOMY 100X72 PEDS (DRAPES) ×2 IMPLANT
ELECT REM PT RETURN 9FT ADLT (ELECTROSURGICAL) ×2
ELECTRODE REM PT RTRN 9FT ADLT (ELECTROSURGICAL) ×1 IMPLANT
GAUZE 4X4 16PLY RFD (DISPOSABLE) ×2 IMPLANT
GLOVE BIO SURGEON STRL SZ8 (GLOVE) ×2 IMPLANT
GLOVE SRG 8 PF TXTR STRL LF DI (GLOVE) ×1 IMPLANT
GLOVE SURG UNDER POLY LF SZ8 (GLOVE) ×1
GOWN STRL REUS W/ TWL LRG LVL3 (GOWN DISPOSABLE) ×2 IMPLANT
GOWN STRL REUS W/ TWL XL LVL3 (GOWN DISPOSABLE) ×1 IMPLANT
GOWN STRL REUS W/TWL LRG LVL3 (GOWN DISPOSABLE) ×2
GOWN STRL REUS W/TWL XL LVL3 (GOWN DISPOSABLE) ×1
KIT BASIN OR (CUSTOM PROCEDURE TRAY) ×2 IMPLANT
KIT TURNOVER KIT B (KITS) ×2 IMPLANT
NEEDLE HYPO 25GX1X1/2 BEV (NEEDLE) ×2 IMPLANT
NS IRRIG 1000ML POUR BTL (IV SOLUTION) ×2 IMPLANT
PACK GENERAL/GYN (CUSTOM PROCEDURE TRAY) ×2 IMPLANT
PAD ARMBOARD 7.5X6 YLW CONV (MISCELLANEOUS) ×4 IMPLANT
PENCIL SMOKE EVACUATOR (MISCELLANEOUS) ×2 IMPLANT
SUT MNCRL AB 4-0 PS2 18 (SUTURE) ×2 IMPLANT
SUT VIC AB 2-0 SH 27 (SUTURE) ×1
SUT VIC AB 2-0 SH 27X BRD (SUTURE) ×1 IMPLANT
SUT VIC AB 3-0 SH 27 (SUTURE) ×1
SUT VIC AB 3-0 SH 27X BRD (SUTURE) ×1 IMPLANT
SYR CONTROL 10ML LL (SYRINGE) ×2 IMPLANT
TOWEL GREEN STERILE (TOWEL DISPOSABLE) ×2 IMPLANT
TOWEL GREEN STERILE FF (TOWEL DISPOSABLE) ×2 IMPLANT

## 2021-01-07 NOTE — Anesthesia Procedure Notes (Signed)
Procedure Name: LMA Insertion Date/Time: 01/07/2021 10:07 AM Performed by: Rande Brunt, CRNA Pre-anesthesia Checklist: Patient identified, Emergency Drugs available, Suction available and Patient being monitored Patient Re-evaluated:Patient Re-evaluated prior to induction Oxygen Delivery Method: Circle System Utilized Preoxygenation: Pre-oxygenation with 100% oxygen Induction Type: IV induction Ventilation: Mask ventilation without difficulty LMA: LMA inserted LMA Size: 4.0 Number of attempts: 1 Airway Equipment and Method: Bite block Placement Confirmation: positive ETCO2 Tube secured with: Tape Dental Injury: Teeth and Oropharynx as per pre-operative assessment

## 2021-01-07 NOTE — Op Note (Signed)
Preoperative diagnosis: Stage I left breast cancer  Postoperative diagnosis: Same  Procedure: Left axillary sentinel lymph node mapping  Surgeon: Erroll Luna, MD  Anesthesia: LMA with 0.25% Marcaine plain local  EBL: Minimal  Specimen: 2 left axillary sentinel nodes blue and hot  Drains: None  Indications for procedure: The patient is a 36 female who underwent previous left breast lumpectomy for an atypical breast lesion.  Final pathology showed a low-grade stage I left breast cancer.  She presents today for left axillary sentinel lymph node mapping for completion of her staging and treatment options.Sentinel lymph node mapping and dissection has been discussed with the patient.  Risk of bleeding,  Infection,  Seroma formation,  Additional procedures,,  Shoulder weakness ,  Shoulder stiffness, lymphedema nerve and blood vessel injury and reaction to the mapping dyes have been discussed.  Alternatives to surgery have been discussed with the patient.  The patient agrees to proceed.   Description of procedure: The patient was met in the holding area and questions were answered.  She underwent injection of the left breast with technetium sulfur colloid.  She was then taken back to the operative room.  She is placed supine upon induction.  Procedure, left breast was prepped and draped sterile fashion timeout performed.  The patient, site and procedure were verified.  Cortices of methylene blue dye were injected in a subareolar position.  This was admixed with 4 cc of saline.  The total amount injected was 4 cc.  After 5 minutes massage neoprobe was used to identify hotspot still.  A 3 cm incision was made dissection was carried down into the level 1 axilla lymph node basin.  There were 2 hot nodes identified below the wound.  Background counts approached baseline.  These were level 1 nodes.  The long thoracic nerve, thoracodorsal trunk and axillary vein were preserved.  Irrigation was used.   Hemostasis achieved with cautery.  Wound closed clear with 3-0 Vicryl and 4 Monocryl was used to close skin.  Dermabond applied.  All counts were correct.  The patient was awoke extubated taken recovery in satisfactory condition.

## 2021-01-07 NOTE — Transfer of Care (Signed)
Immediate Anesthesia Transfer of Care Note  Patient: Saryah Loper  Procedure(s) Performed: LEFT SENTINEL NODE BIOPSY (Left Breast)  Patient Location: PACU  Anesthesia Type:General  Level of Consciousness: awake, alert , oriented and patient cooperative  Airway & Oxygen Therapy: Patient Spontanous Breathing  Post-op Assessment: Report given to RN, Post -op Vital signs reviewed and stable and Patient moving all extremities  Post vital signs: Reviewed and stable  Last Vitals:  Vitals Value Taken Time  BP 140/74 01/07/21 1051  Temp    Pulse 77 01/07/21 1051  Resp 14 01/07/21 1051  SpO2 99 % 01/07/21 1051  Vitals shown include unvalidated device data.  Last Pain:  Vitals:   01/07/21 0807  TempSrc:   PainSc: 0-No pain         Complications: No complications documented.

## 2021-01-07 NOTE — H&P (Signed)
[name] is an 50 y.o. female.   Chief Complaint: Left breast cancer HPI: Patient presents for left axillary sentinel lymph node mapping after lumpectomy was performed for a presumed benign disease.  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  She presents today for left axillary sentinel lymph node mapping.  Past Medical History:  Diagnosis Date  . Allergy   . Anemia    present for many years, mild, stable  . Anxiety   . Breast cancer ([facility])   . Breast cancer ([facility]) 12/18/2020  . Dysrhythmia    PAC's on flecanide (did not tolerate beta blockers)  . Frequent headaches   . HSV infection    fever blisters  . Hypertension   . Hypothyroidism     Past Surgical History:  Procedure Laterality Date  . APPENDECTOMY  2013  . BREAST LUMPECTOMY WITH RADIOACTIVE SEED LOCALIZATION Left 12/18/2020   Procedure: LEFT BREAST LUMPECTOMY WITH RADIOACTIVE SEED LOCALIZATION;  Surgeon: Erroll Luna, MD;  Location: [facility];  Service: General;  Laterality: Left;  . KNEE ARTHROSCOPY Right   . lapband  2013  . TONSILLECTOMY  2012    Family History  Problem Relation Age of Onset  . Cancer Mother 43       breast cancer   . Depression Mother   . Breast cancer Mother   . Cancer Father 57       prostate cancer   . Hyperlipidemia Father    Social History:  reports that she has never smoked. She has never used smokeless tobacco. She reports current alcohol use. She reports that she does not use drugs.  Allergies:  Allergies  Allergen Reactions  . Ciprofloxacin     anxiety  . Keflex [Cephalexin] Hives  . Trazodone And Nefazodone Other (See Comments)    nightmares    Medications Prior to Admission  Medication Sig Dispense Refill  . ALPRAZolam (XANAX) 0.25 MG tablet Take 1 tablet (0.25 mg total) by mouth 2 (two) times daily as needed. (Patient taking differently: Take 0.25 mg by mouth 2 (two) times  daily as needed for anxiety.) 20 tablet 0  . AMETHIA 0.15-0.03 &0.01 MG tablet Take 1 tablet by mouth daily. (Patient taking differently: Take 1 tablet by mouth at bedtime.) 1 Package 0  . butalbital-acetaminophen-caffeine (FIORICET) 50-325-40 MG tablet TAKE 1 TABLET BY MOUTH EVERY 6 (SIX) HOURS AS NEEDED FOR HEADACHE. 20 tablet 2  . cyclobenzaprine (FLEXERIL) 10 MG tablet Take 1 tablet (10 mg total) by mouth daily as needed for muscle spasms. (Patient taking differently: Take 10 mg by mouth daily as needed (migraines).) 15 tablet 0  . flecainide (TAMBOCOR) 50 MG tablet TAKE 1 TABLET BY MOUTH TWICE A DAY (Patient taking differently: Take 50 mg by mouth 2 (two) times daily.) 180 tablet 1  . gabapentin (NEURONTIN) 100 MG capsule Take 1 capsule (100 mg total) by mouth 3 (three) times daily. (Patient taking differently: Take 100 mg by mouth 3 (three) times daily as needed (pain).) 270 capsule 0  . ibuprofen (ADVIL) 800 MG tablet Take 1 tablet (800 mg total) by mouth every 8 (eight) hours as needed. (Patient taking differently: Take 800 mg by mouth every 8 (eight) hours as needed (pain).) 30 tablet 0  . levocetirizine (XYZAL) 5 MG tablet TAKE 1 TABLET BY MOUTH EVERY DAY IN THE EVENING (Patient taking differently: Take 5 mg by mouth at bedtime.) 90 tablet 0  . levothyroxine (SYNTHROID)  112 MCG tablet TAKE 1 TABLET (112 MCG TOTAL) BY MOUTH DAILY BEFORE BREAKFAST. (Patient taking differently: Take 112 mcg by mouth at bedtime.) 90 tablet 0  . losartan (COZAAR) 25 MG tablet TAKE 1 TABLET BY MOUTH EVERY DAY (Patient taking differently: Take 25 mg by mouth at bedtime.) 90 tablet 3  . oxyCODONE (OXY IR/ROXICODONE) 5 MG immediate release tablet Take 1 tablet (5 mg total) by mouth every 6 (six) hours as needed for severe pain. 15 tablet 0  . Prenatal Vit-Fe Fumarate-FA (PRENATAL MULTIVITAMIN) TABS tablet Take 1 tablet by mouth at bedtime.    . rizatriptan (MAXALT-MLT) 10 MG disintegrating tablet Take 1 tablet (10 mg  total) by mouth as needed for migraine. May repeat in 2 hours if needed 10 tablet 1  . sertraline (ZOLOFT) 100 MG tablet TAKE 1 TABLET BY MOUTH EVERY DAY (Patient taking differently: Take 100 mg by mouth at bedtime.) 90 tablet 0  . SUMAtriptan (IMITREX) 25 MG tablet TAKE 1 TABLET (25 MG TOTAL) BY MOUTH EVERY 2 (TWO) HOURS AS NEEDED FOR MIGRAINE. MAY REPEAT IN 2 HOURS IF HEADACHE PERSISTS OR RECURS. 10 tablet 0  . fluticasone (FLONASE) 50 MCG/ACT nasal spray SPRAY 2 SPRAYS INTO EACH NOSTRIL EVERY DAY (Patient taking differently: Place 2 sprays into both nostrils daily as needed for allergies.) 48 mL 0  . pravastatin (PRAVACHOL) 40 MG tablet Take 1 tablet (40 mg total) by mouth daily. 90 tablet 0  . valACYclovir (VALTREX) 500 MG tablet Take 500 mg by mouth daily as needed (herpes simplex).  3    Results for orders placed or performed during the hospital encounter of 01/07/21 (from the past 48 hour(s))  CBC WITH DIFFERENTIAL     Status: Abnormal   Collection Time: 01/07/21  7:33 AM  Result Value Ref Range   WBC 6.0 4.0 - 10.5 K/uL   RBC 3.84 (L) 3.87 - 5.11 MIL/uL   Hemoglobin 9.5 (L) 12.0 - 15.0 g/dL   HCT 30.3 (L) 36.0 - 46.0 %   MCV 78.9 (L) 80.0 - 100.0 fL   MCH 24.7 (L) 26.0 - 34.0 pg   MCHC 31.4 30.0 - 36.0 g/dL   RDW 15.9 (H) 11.5 - 15.5 %   Platelets 281 150 - 400 K/uL   nRBC 0.0 0.0 - 0.2 %   Neutrophils Relative % 60 %   Neutro Abs 3.6 1.7 - 7.7 K/uL   Lymphocytes Relative 31 %   Lymphs Abs 1.9 0.7 - 4.0 K/uL   Monocytes Relative 7 %   Monocytes Absolute 0.4 0.1 - 1.0 K/uL   Eosinophils Relative 1 %   Eosinophils Absolute 0.1 0.0 - 0.5 K/uL   Basophils Relative 1 %   Basophils Absolute 0.0 0.0 - 0.1 K/uL   Immature Granulocytes 0 %   Abs Immature Granulocytes 0.01 0.00 - 0.07 K/uL    Comment: Performed at [facility], [address], [place], [place] [address]  Pregnancy, urine POC     Status: None   Collection Time: 01/07/21  8:20 AM  Result Value Ref Range   Preg  Test, Ur NEGATIVE NEGATIVE    Comment:        THE SENSITIVITY OF THIS METHODOLOGY IS >24 mIU/mL    No results found.  Review of Systems  All other systems reviewed and are negative.   Blood pressure (!) 171/80, pulse 88, temperature 98.5 F (36.9 C), temperature source Oral, resp. rate 18, height 5\' 5"  (1.651 m), weight 99.8 kg,  SpO2 97 %. Physical Exam HENT:     Head: Normocephalic and atraumatic.     Mouth/Throat:     Mouth: Mucous membranes are moist.  Eyes:     Pupils: Pupils are equal, round, and reactive to light.  Cardiovascular:     Rate and Rhythm: Normal rate.  Pulmonary:     Effort: Pulmonary effort is normal.     Breath sounds: Normal breath sounds.  Chest:     Chest wall: No mass.  Breasts:     Right: No axillary adenopathy.     Left: No axillary adenopathy.     Lymphadenopathy:     Upper Body:     Right upper body: No axillary adenopathy.     Left upper body: No axillary adenopathy.  Neurological:     General: No focal deficit present.  Psychiatric:        Mood and Affect: Mood normal.      Assessment/Plan Stage I left breast cancer  Presents today for left axillary sentinel lymph node mapping.  Her initial lumpectomy was done for presumed high risk/benign disease.  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  She presents for left axillary sentinel for mapping to complete her staging.Sentinel lymph node mapping and dissection has been discussed with the patient.  Risk of bleeding,  Infection,  Seroma formation,  Additional procedures,,  Shoulder weakness , arm swelling, shoulder stiffness,  Nerve and blood vessel injury and reaction to the mapping dyes have been discussed.  Alternatives to surgery have been discussed with the patient.  The patient agrees to proceed.  Turner Daniels, MD 01/07/2021, 8:53 AM

## 2021-01-07 NOTE — Discharge Instructions (Signed)
GENERAL SURGERY: POST OP INSTRUCTIONS  ######################################################################  EAT Gradually transition to a high fiber diet with a fiber supplement over the next few weeks after discharge.  Start with a pureed / full liquid diet (see below)  WALK Walk an hour a day.  Control your pain to do that.    CONTROL PAIN Control pain so that you can walk, sleep, tolerate sneezing/coughing, go up/down stairs.  HAVE A BOWEL MOVEMENT DAILY Keep your bowels regular to avoid problems.  OK to try a laxative to override constipation.  OK to use an antidairrheal to slow down diarrhea.  Call if not better after 2 tries  CALL IF YOU HAVE PROBLEMS/CONCERNS Call if you are still struggling despite following these instructions. Call if you have concerns not answered by these instructions  ######################################################################    1. DIET: Follow a light bland diet & liquids the first 24 hours after arrival home, such as soup, liquids, starches, etc.  Be sure to drink plenty of fluids.  Quickly advance to a usual solid diet within a few days.  Avoid fast food or heavy meals as your are more likely to get nauseated or have irregular bowels.  A low-fat, high-fiber diet for the rest of your life is ideal.   2. Take your usually prescribed home medications unless otherwise directed. 3. PAIN CONTROL: a. Pain is best controlled by a usual combination of three different methods TOGETHER: i. Ice/Heat ii. Over the counter pain medication iii. Prescription pain medication b. Most patients will experience some swelling and bruising around the incisions.  Ice packs or heating pads (30-60 minutes up to 6 times a day) will help. Use ice for the first few days to help decrease swelling and bruising, then switch to heat to help relax tight/sore spots and speed recovery.  Some people prefer to use ice alone, heat alone, alternating between ice & heat.   Experiment to what works for you.  Swelling and bruising can take several weeks to resolve.   c. It is helpful to take an over-the-counter pain medication regularly for the first few weeks.  Choose one of the following that works best for you: i. Naproxen (Aleve, etc)  Two 220mg tabs twice a day ii. Ibuprofen (Advil, etc) Three 200mg tabs four times a day (every meal & bedtime) iii. Acetaminophen (Tylenol, etc) 500-650mg four times a day (every meal & bedtime) d. A  prescription for pain medication (such as oxycodone, hydrocodone, etc) should be given to you upon discharge.  Take your pain medication as prescribed.  i. If you are having problems/concerns with the prescription medicine (does not control pain, nausea, vomiting, rash, itching, etc), please call us [phone] to see if we need to switch you to a different pain medicine that will work better for you and/or control your side effect better. ii. If you need a refill on your pain medication, please contact your pharmacy.  They will contact our office to request authorization. Prescriptions will not be filled after 5 pm or on week-ends. 4. Avoid getting constipated.  Between the surgery and the pain medications, it is common to experience some constipation.  Increasing fluid intake and taking a fiber supplement (such as Metamucil, Citrucel, FiberCon, MiraLax, etc) 1-2 times a day regularly will usually help prevent this problem from occurring.  A mild laxative (prune juice, Milk of Magnesia, MiraLax, etc) should be taken according to package directions if there are no bowel movements after 48 hours.   5. Wash /   shower every day.  You may shower over the dressings as they are waterproof.  Continue to shower over incision(s) after the dressing is off. 6. Remove your waterproof bandages 5 days after surgery.  You may leave the incision open to air.  You may have skin tapes (Steri Strips) covering the incision(s).  Leave them on until one week, then  remove.  You may replace a dressing/Band-Aid to cover the incision for comfort if you wish.      7. ACTIVITIES as tolerated:   a. You may resume regular (light) daily activities beginning the next day--such as daily self-care, walking, climbing stairs--gradually increasing activities as tolerated.  If you can walk 30 minutes without difficulty, it is safe to try more intense activity such as jogging, treadmill, bicycling, low-impact aerobics, swimming, etc. b. Save the most intensive and strenuous activity for last such as sit-ups, heavy lifting, contact sports, etc  Refrain from any heavy lifting or straining until you are off narcotics for pain control.   c. DO NOT PUSH THROUGH PAIN.  Let pain be your guide: If it hurts to do something, don't do it.  Pain is your body warning you to avoid that activity for another week until the pain goes down. d. You may drive when you are no longer taking prescription pain medication, you can comfortably wear a seatbelt, and you can safely maneuver your car and apply brakes. e. You may have sexual intercourse when it is comfortable.  8. FOLLOW UP in our office a. Please call [facility] at [phone] to set up an appointment to see your surgeon in the office for a follow-up appointment approximately 2-3 weeks after your surgery. b. Make sure that you call for this appointment the day you arrive home to insure a convenient appointment time. 9. IF YOU HAVE DISABILITY OR FAMILY LEAVE FORMS, BRING THEM TO THE OFFICE FOR PROCESSING.  DO NOT GIVE THEM TO YOUR DOCTOR.   WHEN TO CALL US [phone]: 1. Poor pain control 2. Reactions / problems with new medications (rash/itching, nausea, etc)  3. Fever over 101.5 F (38.5 C) 4. Worsening swelling or bruising 5. Continued bleeding from incision. 6. Increased pain, redness, or drainage from the incision 7. Difficulty breathing / swallowing   The clinic staff is available to answer your questions during regular  business hours (8:30am-5pm).  Please don't hesitate to call and ask to speak to one of our nurses for clinical concerns.   If you have a medical emergency, go to the nearest emergency room or call 911.  A surgeon from [facility] is always on call at the hospitals   [facility] [address], [address], [place], [place]  [address] ? MAIN: [phone] ? TOLL FREE: [phone] ?  FAX [phone] [email or web address]  

## 2021-01-07 NOTE — Interval H&P Note (Signed)
History and Physical Interval Note:  01/07/2021 8:56 AM  [name]  has presented today for surgery, with the diagnosis of LEFT BREAST CANCER.  The various methods of treatment have been discussed with the patient and family. After consideration of risks, benefits and other options for treatment, the patient has consented to  Procedure(s): LEFT SENTINEL NODE BIOPSY (Left) as a surgical intervention.  The patient's history has been reviewed, patient examined, no change in status, stable for surgery.  I have reviewed the patient's chart and labs.  Questions were answered to the patient's satisfaction.     [place]

## 2021-01-07 NOTE — Anesthesia Preprocedure Evaluation (Addendum)
Anesthesia Evaluation  Patient identified by MRN, date of birth, ID band  Airway Mallampati: II  TM Distance: >3 FB Neck ROM: Full    Dental  (+) Missing, Chipped, Dental Advisory Given,    Pulmonary neg pulmonary ROS,    Pulmonary exam normal breath sounds clear to auscultation       Cardiovascular hypertension, Pt. on medications Normal cardiovascular exam+ dysrhythmias (PACs, nonsustained VT on flecainide)  Rhythm:Regular Rate:Normal  TTE 2020 1. Left ventricular ejection fraction, by visual estimation, is 55 to  60%. The left ventricle has normal function. Normal left ventricular size.  There is borderline left ventricular hypertrophy.  2. Global right ventricle has normal systolic function.The right  ventricular size is mildly enlarged. No increase in right ventricular wall  thickness.  3. Left atrial size was normal.  4. Right atrial size was normal.  5. The mitral valve is normal in structure. Mild mitral valve  regurgitation.  6. The tricuspid valve is grossly normal. Tricuspid valve regurgitation  is mild.  7. The aortic valve is tricuspid Aortic valve regurgitation was not  visualized by color flow Doppler. Structurally normal aortic valve, with  no evidence of sclerosis or stenosis.  8. The pulmonic valve was normal in structure. Pulmonic valve  regurgitation is mild by color flow Doppler.  9. Normal pulmonary artery systolic pressure.  10. The inferior vena cava is normal in size with greater than 50%  respiratory variability, suggesting right atrial pressure of 3 mmHg  Stress Test 2021 Negative  Event Monitor 2020 Normal sinus rhythm Avg HR of 77 bpm.  1 run of Ventricular Tachycardia occurred lasting 7 beats with a max rate of 164 bpm (avg 148 bpm).   9 Supraventricular Tachycardia runs occurred, the run with the fastest interval lasting 7 beats with a max rate of 200 bpm, the longest lasting 10  beats with an avg rate of 131 bpm.  Isolated SVEs were occasional (2.1%, 92119), SVE Couplets were rare (<1.0%, 35), and SVE Triplets were rare (<1.0%, 8).  Isolated VEs were rare (<1.0%), and no VE Couplets or VE Triplets were present.  Patient triggered events associated with normal sinus rhythm, sometimes with PACs    Neuro/Psych  Headaches, PSYCHIATRIC DISORDERS Anxiety    GI/Hepatic negative GI ROS, Neg liver ROS,   Endo/Other  Hypothyroidism   Renal/GU negative Renal ROS  negative genitourinary   Musculoskeletal negative musculoskeletal ROS (+)   Abdominal   Peds  Hematology negative hematology ROS (+)   Anesthesia Other Findings   Reproductive/Obstetrics                            Anesthesia Physical  Anesthesia Plan  ASA: III  Anesthesia Plan: General   Post-op Pain Management:    Induction: Intravenous  PONV Risk Score and Plan: 3 and Ondansetron, Dexamethasone and Midazolam  Airway Management Planned: LMA  Additional Equipment: None  Intra-op Plan:   Post-operative Plan: Extubation in OR  Informed Consent: I have reviewed the patients History and Physical, chart, labs and discussed the procedure including the risks, benefits and alternatives for the proposed anesthesia with the patient or authorized representative who has indicated his/her understanding and acceptance.     Dental advisory given  Plan Discussed with: CRNA, Anesthesiologist and Surgeon  Anesthesia Plan Comments:        Anesthesia Quick Evaluation

## 2021-01-07 NOTE — Anesthesia Postprocedure Evaluation (Signed)
Anesthesia Post Note  Patient: [name]  Procedure(s) Performed: LEFT SENTINEL NODE BIOPSY (Left Breast)     Patient location during evaluation: PACU Anesthesia Type: General Level of consciousness: awake Pain management: pain level controlled Vital Signs Assessment: post-procedure vital signs reviewed and stable Respiratory status: spontaneous breathing and respiratory function stable Cardiovascular status: stable Postop Assessment: no apparent nausea or vomiting Anesthetic complications: no   No complications documented.  Last Vitals:  Vitals:   01/07/21 1118 01/07/21 1133  BP: (!) 171/80 (!) 170/74  Pulse: 85 82  Resp: 19 19  Temp:  (!) 36.1 C  SpO2:      Last Pain:  Vitals:   01/07/21 1118  TempSrc:   PainSc: 7                  Candra R Kylinn Shropshire

## 2021-01-08 ENCOUNTER — Encounter (HOSPITAL_COMMUNITY): Payer: Self-pay | Admitting: Surgery

## 2021-01-10 ENCOUNTER — Other Ambulatory Visit: Payer: Self-pay | Admitting: Hematology

## 2021-01-10 LAB — SURGICAL PATHOLOGY

## 2021-01-13 ENCOUNTER — Inpatient Hospital Stay: Payer: BC Managed Care – PPO

## 2021-01-13 ENCOUNTER — Encounter: Payer: Self-pay | Admitting: Radiation Oncology

## 2021-01-13 ENCOUNTER — Inpatient Hospital Stay: Payer: BC Managed Care – PPO | Admitting: Genetic Counselor

## 2021-01-13 ENCOUNTER — Encounter: Payer: Self-pay | Admitting: Hematology

## 2021-01-13 ENCOUNTER — Telehealth: Payer: Self-pay | Admitting: Radiation Oncology

## 2021-01-13 ENCOUNTER — Telehealth: Payer: Self-pay | Admitting: Genetic Counselor

## 2021-01-13 ENCOUNTER — Telehealth: Payer: Self-pay | Admitting: *Deleted

## 2021-01-13 ENCOUNTER — Encounter: Payer: Self-pay | Admitting: *Deleted

## 2021-01-13 NOTE — Telephone Encounter (Signed)
Received order for oncotype testing. Requisition faxed to pathology and [facility] °

## 2021-01-13 NOTE — Telephone Encounter (Signed)
I spoke with the patient after I noted her lymph node biopsies were clear of carcinoma.  She is still waiting Oncotype scoring results.  Hopefully these will result this week.  We discussed that she seems to be doing well healing at this time.  She is scheduled to meet with genetic testing later this week.  She is interested in moving forward with simulation, and was scheduled to come in next Monday for this.  She will signed written consent at that time.  I encouraged her to take an at home pregnancy test prior to simulation the day of or the day before.  She is otherwise going to plan to continue her oral contraception until she can get into see her gynecologist for a copper IUD.  A letter was also provided via MyChart to the patient so that she can remain out of work during radiotherapy.

## 2021-01-13 NOTE — Telephone Encounter (Signed)
Patient called to reschedule genetic counseling appt due to migraine.  Rescheduled to 01/16/2021 at 8am.

## 2021-01-14 ENCOUNTER — Other Ambulatory Visit: Payer: Self-pay | Admitting: Internal Medicine

## 2021-01-14 ENCOUNTER — Other Ambulatory Visit: Payer: Self-pay | Admitting: *Deleted

## 2021-01-14 DIAGNOSIS — I493 Ventricular premature depolarization: Secondary | ICD-10-CM

## 2021-01-14 DIAGNOSIS — I491 Atrial premature depolarization: Secondary | ICD-10-CM

## 2021-01-14 NOTE — Telephone Encounter (Signed)
  Notes to clinic:  Patient has appointment on 01/17/2021 Review for refills    Requested Prescriptions  Pending Prescriptions Disp Refills   pravastatin (PRAVACHOL) 40 MG tablet [Pharmacy Med Name: PRAVASTATIN SODIUM 40 MG TAB] 90 tablet 0    Sig: TAKE 1 TABLET BY MOUTH EVERY DAY      There is no refill protocol information for this order      cyclobenzaprine (FLEXERIL) 10 MG tablet [Pharmacy Med Name: CYCLOBENZAPRINE 10 MG TABLET] 15 tablet 0    Sig: TAKE 1 TABLET BY MOUTH DAILY AS NEEDED FOR MUSCLE SPASMS.      There is no refill protocol information for this order

## 2021-01-16 ENCOUNTER — Other Ambulatory Visit: Payer: Self-pay | Admitting: Genetic Counselor

## 2021-01-16 ENCOUNTER — Inpatient Hospital Stay: Payer: BC Managed Care – PPO | Attending: Genetic Counselor | Admitting: Genetic Counselor

## 2021-01-16 ENCOUNTER — Encounter: Payer: Self-pay | Admitting: Genetic Counselor

## 2021-01-16 ENCOUNTER — Inpatient Hospital Stay: Payer: BC Managed Care – PPO

## 2021-01-16 ENCOUNTER — Other Ambulatory Visit: Payer: Self-pay

## 2021-01-16 DIAGNOSIS — Z1379 Encounter for other screening for genetic and chromosomal anomalies: Secondary | ICD-10-CM

## 2021-01-16 DIAGNOSIS — C50412 Malignant neoplasm of upper-outer quadrant of left female breast: Secondary | ICD-10-CM | POA: Diagnosis not present

## 2021-01-16 DIAGNOSIS — D649 Anemia, unspecified: Secondary | ICD-10-CM

## 2021-01-16 DIAGNOSIS — Z8042 Family history of malignant neoplasm of prostate: Secondary | ICD-10-CM | POA: Diagnosis not present

## 2021-01-16 DIAGNOSIS — I493 Ventricular premature depolarization: Secondary | ICD-10-CM

## 2021-01-16 DIAGNOSIS — Z170 Estrogen receptor positive status [ER+]: Secondary | ICD-10-CM

## 2021-01-16 DIAGNOSIS — Z803 Family history of malignant neoplasm of breast: Secondary | ICD-10-CM | POA: Diagnosis not present

## 2021-01-16 DIAGNOSIS — I491 Atrial premature depolarization: Secondary | ICD-10-CM

## 2021-01-16 DIAGNOSIS — Z79899 Other long term (current) drug therapy: Secondary | ICD-10-CM | POA: Insufficient documentation

## 2021-01-16 LAB — CBC WITH DIFFERENTIAL (CANCER CENTER ONLY)
Abs Immature Granulocytes: 0.01 10*3/uL (ref 0.00–0.07)
Basophils Absolute: 0.1 10*3/uL (ref 0.0–0.1)
Basophils Relative: 1 %
Eosinophils Absolute: 0.2 10*3/uL (ref 0.0–0.5)
Eosinophils Relative: 5 %
HCT: 31.8 % — ABNORMAL LOW (ref 36.0–46.0)
Hemoglobin: 10.2 g/dL — ABNORMAL LOW (ref 12.0–15.0)
Immature Granulocytes: 0 %
Lymphocytes Relative: 38 %
Lymphs Abs: 1.6 10*3/uL (ref 0.7–4.0)
MCH: 25.1 pg — ABNORMAL LOW (ref 26.0–34.0)
MCHC: 32.1 g/dL (ref 30.0–36.0)
MCV: 78.1 fL — ABNORMAL LOW (ref 80.0–100.0)
Monocytes Absolute: 0.4 10*3/uL (ref 0.1–1.0)
Monocytes Relative: 9 %
Neutro Abs: 2 10*3/uL (ref 1.7–7.7)
Neutrophils Relative %: 47 %
Platelet Count: 258 10*3/uL (ref 150–400)
RBC: 4.07 MIL/uL (ref 3.87–5.11)
RDW: 16 % — ABNORMAL HIGH (ref 11.5–15.5)
WBC Count: 4.3 10*3/uL (ref 4.0–10.5)
nRBC: 0 % (ref 0.0–0.2)

## 2021-01-16 LAB — RETIC PANEL
Immature Retic Fract: 15.8 % (ref 2.3–15.9)
RBC.: 4.13 MIL/uL (ref 3.87–5.11)
Retic Count, Absolute: 47.1 10*3/uL (ref 19.0–186.0)
Retic Ct Pct: 1.1 % (ref 0.4–3.1)
Reticulocyte Hemoglobin: 28.3 pg (ref >27.9)

## 2021-01-16 LAB — IRON AND TIBC
Iron: 24 ug/dL — ABNORMAL LOW (ref 41–142)
Saturation Ratios: 4 % — ABNORMAL LOW (ref 21–57)
TIBC: 578 ug/dL — ABNORMAL HIGH (ref 236–444)
UIBC: 553 ug/dL — ABNORMAL HIGH (ref 120–384)

## 2021-01-16 LAB — CMP (CANCER CENTER ONLY)
ALT: <6 U/L (ref 0–44)
AST: 11 U/L — ABNORMAL LOW (ref 15–41)
Albumin: 3.3 g/dL — ABNORMAL LOW (ref 3.5–5.0)
Alkaline Phosphatase: 96 U/L (ref 38–126)
Anion gap: 10 (ref 5–15)
BUN: 10 mg/dL (ref 6–20)
CO2: 24 mmol/L (ref 22–32)
Calcium: 8.9 mg/dL (ref 8.9–10.3)
Chloride: 105 mmol/L (ref 98–111)
Creatinine: 0.99 mg/dL (ref 0.44–1.00)
GFR, Estimated: >60 mL/min (ref >60)
Glucose, Bld: 90 mg/dL (ref 70–99)
Potassium: 3.9 mmol/L (ref 3.5–5.1)
Sodium: 139 mmol/L (ref 135–145)
Total Bilirubin: <0.2 mg/dL — ABNORMAL LOW (ref 0.3–1.2)
Total Protein: 6.8 g/dL (ref 6.5–8.1)

## 2021-01-16 LAB — FERRITIN: Ferritin: 8 ng/mL — ABNORMAL LOW (ref 11–307)

## 2021-01-16 LAB — VITAMIN B12: Vitamin B-12: 92 pg/mL — ABNORMAL LOW (ref 180–914)

## 2021-01-16 LAB — GENETIC SCREENING ORDER

## 2021-01-17 ENCOUNTER — Encounter: Payer: Self-pay | Admitting: Internal Medicine

## 2021-01-17 ENCOUNTER — Ambulatory Visit (INDEPENDENT_AMBULATORY_CARE_PROVIDER_SITE_OTHER): Payer: BC Managed Care – PPO | Admitting: Internal Medicine

## 2021-01-17 VITALS — BP 138/88 | Resp 15 | Ht 65.0 in | Wt 221.2 lb

## 2021-01-17 DIAGNOSIS — Z0000 Encounter for general adult medical examination without abnormal findings: Secondary | ICD-10-CM

## 2021-01-17 DIAGNOSIS — Z170 Estrogen receptor positive status [ER+]: Secondary | ICD-10-CM

## 2021-01-17 DIAGNOSIS — Z0001 Encounter for general adult medical examination with abnormal findings: Secondary | ICD-10-CM

## 2021-01-17 DIAGNOSIS — D508 Other iron deficiency anemias: Secondary | ICD-10-CM

## 2021-01-17 DIAGNOSIS — G44221 Chronic tension-type headache, intractable: Secondary | ICD-10-CM | POA: Diagnosis not present

## 2021-01-17 DIAGNOSIS — Z1159 Encounter for screening for other viral diseases: Secondary | ICD-10-CM

## 2021-01-17 DIAGNOSIS — D509 Iron deficiency anemia, unspecified: Secondary | ICD-10-CM | POA: Insufficient documentation

## 2021-01-17 DIAGNOSIS — E039 Hypothyroidism, unspecified: Secondary | ICD-10-CM

## 2021-01-17 DIAGNOSIS — I493 Ventricular premature depolarization: Secondary | ICD-10-CM | POA: Insufficient documentation

## 2021-01-17 DIAGNOSIS — F411 Generalized anxiety disorder: Secondary | ICD-10-CM

## 2021-01-17 DIAGNOSIS — Z114 Encounter for screening for human immunodeficiency virus [HIV]: Secondary | ICD-10-CM

## 2021-01-17 DIAGNOSIS — E7800 Pure hypercholesterolemia, unspecified: Secondary | ICD-10-CM | POA: Diagnosis not present

## 2021-01-17 DIAGNOSIS — B009 Herpesviral infection, unspecified: Secondary | ICD-10-CM

## 2021-01-17 DIAGNOSIS — C50412 Malignant neoplasm of upper-outer quadrant of left female breast: Secondary | ICD-10-CM | POA: Diagnosis not present

## 2021-01-17 LAB — FOLLICLE STIMULATING HORMONE: FSH: 11.9 m[IU]/mL

## 2021-01-17 MED ORDER — VALACYCLOVIR HCL 500 MG PO TABS: 500.0000 mg | ORAL_TABLET | Freq: Every day | ORAL | 3 refills | Status: DC | PRN

## 2021-01-17 NOTE — Assessment & Plan Note (Signed)
She is not currently taking oral iron She will continue to follow with hematology Will follow

## 2021-01-17 NOTE — Assessment & Plan Note (Signed)
We will have her schedule lab only appointment for TSH and free T4 Continue Levothyroxine for now, will adjust dose if needed based on labs

## 2021-01-17 NOTE — Progress Notes (Signed)
Subjective:    Patient ID: [name], female    DOB: [birth year], 50 y.o.   MRN: [id number]  HPI  Patient presents the clinic today for her annual exam.  She is also due to follow-up chronic conditions.  Hypothyroidism: She denies any issues on her current dose of Levothyroxine.  She does not follow with endocrinology.  Migraines: These occur 1 x week.  Triggered by stress.  She takes Flexeril and alternates Rizatriptan/Sumatriptan with good relief of symptoms.  She does not follow with neurology.  Anxiety: Persistent.  Managed on Sertraline and Xanax.  She is not currently seeing a therapist.  She denies depression, SI/HI.  She is due for a CSA today.  HLD: Her last LDL was 127, triglycerides 94, 12/2019.  She is not taking Pravastatin as prescribed.  She tries to consume a low-fat diet.  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  She takes Valacyclovir as needed.  She would like a refill of this today.  Left Breast Cancer: s/p lumpectomy. She is waiting to hear wether she needs chemo or radiation.  She is following with oncology.  PVC's: Failed beta blocker. Managed on Flecanide, prescribed by cardiology. ECG from 10/2020 reviewed.  Iron Deficiency Anemia: Her last H/H was 10.2/31.8, 01/2021. Ferritin was low. She is not currently taking oral iron.  Flu: never Tetanus: 07/2016 COVID: Pfizer x 2 Pap smear: due, follows with GYN Mammogram: 10/2020 Colon screening: hold off until done with cancer treatment Vision screening: as needed Dentist: biannually  Diet: She does eat meat. She consumes more veggies than fruits. She tries to avoid fried foods. She drinks mostly water, sweet tea Exercise: Walking  Review of Systems  Past Medical History:  Diagnosis Date  . Allergy   . Anemia    present for many years, mild, stable  . Anxiety   . Breast cancer ([place])   . Breast cancer ([place]) 12/18/2020  . Dysrhythmia    PAC's on flecanide (did not tolerate beta blockers)  .  Frequent headaches   . HSV infection    fever blisters  . Hypertension   . Hypothyroidism     Current Outpatient Medications  Medication Sig Dispense Refill  . ALPRAZolam (XANAX) 0.25 MG tablet Take 1 tablet (0.25 mg total) by mouth 2 (two) times daily as needed. (Patient taking differently: Take 0.25 mg by mouth 2 (two) times daily as needed for anxiety.) 20 tablet 0  . AMETHIA 0.15-0.03 &0.01 MG tablet Take 1 tablet by mouth daily. (Patient taking differently: Take 1 tablet by mouth at bedtime.) 1 Package 0  . butalbital-acetaminophen-caffeine (FIORICET) 50-325-40 MG tablet TAKE 1 TABLET BY MOUTH EVERY 6 (SIX) HOURS AS NEEDED FOR HEADACHE. 20 tablet 2  . cyclobenzaprine (FLEXERIL) 10 MG tablet Take 1 tablet (10 mg total) by mouth daily as needed (migraines). 15 tablet 0  . flecainide (TAMBOCOR) 50 MG tablet TAKE 1 TABLET BY MOUTH TWICE A DAY (Patient taking differently: Take 50 mg by mouth 2 (two) times daily.) 180 tablet 1  . fluticasone (FLONASE) 50 MCG/ACT nasal spray SPRAY 2 SPRAYS INTO EACH NOSTRIL EVERY DAY (Patient taking differently: Place 2 sprays into both nostrils daily as needed for allergies.) 48 mL 0  . gabapentin (NEURONTIN) 100 MG capsule Take 1 capsule (100 mg total) by mouth 3 (three) times daily. (Patient taking differently: Take 100 mg by mouth 3 (three) times daily as needed (pain).) 270 capsule 0  . ibuprofen (ADVIL) 800 MG tablet Take 1 tablet (800 mg total)  by mouth every 8 (eight) hours as needed. (Patient taking differently: Take 800 mg by mouth every 8 (eight) hours as needed (pain).) 30 tablet 0  . ibuprofen (ADVIL) 800 MG tablet Take 1 tablet (800 mg total) by mouth every 8 (eight) hours as needed. 30 tablet 0  . levocetirizine (XYZAL) 5 MG tablet TAKE 1 TABLET BY MOUTH EVERY DAY IN THE EVENING (Patient taking differently: Take 5 mg by mouth at bedtime.) 90 tablet 0  . levothyroxine (SYNTHROID) 112 MCG tablet TAKE 1 TABLET (112 MCG TOTAL) BY MOUTH DAILY BEFORE  BREAKFAST. (Patient taking differently: Take 112 mcg by mouth at bedtime.) 90 tablet 0  . losartan (COZAAR) 25 MG tablet TAKE 1 TABLET BY MOUTH EVERY DAY (Patient taking differently: Take 25 mg by mouth at bedtime.) 90 tablet 3  . oxyCODONE (OXY IR/ROXICODONE) 5 MG immediate release tablet Take 1 tablet (5 mg total) by mouth every 6 (six) hours as needed for severe pain. 15 tablet 0  . pravastatin (PRAVACHOL) 40 MG tablet TAKE 1 TABLET BY MOUTH EVERY DAY 90 tablet 0  . Prenatal Vit-Fe Fumarate-FA (PRENATAL MULTIVITAMIN) TABS tablet Take 1 tablet by mouth at bedtime.    . rizatriptan (MAXALT-MLT) 10 MG disintegrating tablet Take 1 tablet (10 mg total) by mouth as needed for migraine. May repeat in 2 hours if needed 10 tablet 1  . sertraline (ZOLOFT) 100 MG tablet TAKE 1 TABLET BY MOUTH EVERY DAY (Patient taking differently: Take 100 mg by mouth at bedtime.) 90 tablet 0  . SUMAtriptan (IMITREX) 25 MG tablet TAKE 1 TABLET (25 MG TOTAL) BY MOUTH EVERY 2 (TWO) HOURS AS NEEDED FOR MIGRAINE. MAY REPEAT IN 2 HOURS IF HEADACHE PERSISTS OR RECURS. 10 tablet 0  . valACYclovir (VALTREX) 500 MG tablet Take 500 mg by mouth daily as needed (herpes simplex).  3   No current facility-administered medications for this visit.    Allergies  Allergen Reactions  . Ciprofloxacin     anxiety  . Keflex [Cephalexin] Hives  . Trazodone And Nefazodone Other (See Comments)    nightmares    Family History  Problem Relation Age of Onset  . Cancer Mother 37       breast cancer   . Depression Mother   . Breast cancer Mother   . Cancer Father 34       prostate cancer   . Hyperlipidemia Father     Social History   Socioeconomic History  . Marital status: Single    Spouse name: Not on file  . Number of children: 1  . Years of education: Not on file  . Highest education level: Not on file  Occupational History  . Not on file  Tobacco Use  . Smoking status: Never Smoker  . Smokeless tobacco: Never Used   Vaping Use  . Vaping Use: Never used  Substance and Sexual Activity  . Alcohol use: Yes    Comment: occasional  . Drug use: No  . Sexual activity: Yes    Partners: Male    Birth control/protection: Pill  Other Topics Concern  . Not on file  Social History Narrative  . Not on file   Social Determinants of Health   Financial Resource Strain: Not on file  Food Insecurity: Not on file  Transportation Needs: Not on file  Physical Activity: Not on file  Stress: Not on file  Social Connections: Not on file  Intimate Partner Violence: Not At Risk  . Fear of Current or Ex-Partner:  No  . Emotionally Abused: No  . Physically Abused: No  . Sexually Abused: No     Constitutional: Pt reports intermittent headaches. Denies fever, malaise, fatigue, or abrupt weight changes.  HEENT: Denies eye pain, eye redness, ear pain, ringing in the ears, wax buildup, runny nose, nasal congestion, bloody nose, or sore throat. Respiratory: Denies difficulty breathing, shortness of breath, cough or sputum production.   Cardiovascular: Patent reports intermittent palpitations.  Denies chest pain, chest tightness, palpitations or swelling in the hands or feet.  Gastrointestinal: Denies abdominal pain, bloating, constipation, diarrhea or blood in the stool.  GU: Denies urgency, frequency, pain with urination, burning sensation, blood in urine, odor or discharge. Musculoskeletal: Denies decrease in range of motion, difficulty with gait, muscle pain or joint pain and swelling.  Skin: Denies redness, rashes, lesions or ulcercations.  Neurological: Denies dizziness, difficulty with memory, difficulty with speech or problems with balance and coordination.  Psych: Pt has a history of anxiety. Denies depression, SI/HI.  No other specific complaints in a complete review of systems (except as listed in HPI above).     Objective:   Physical Exam  Blood pressure 138/88, resp. rate 15, height 5\' 5"  (1.651 m),  weight 221 lb 3.2 oz (100.3 kg).   Wt Readings from Last 3 Encounters:  01/07/21 220 lb (99.8 kg)  01/02/21 220 lb 9.6 oz (100.1 kg)  01/02/21 221 lb 6.4 oz (100.4 kg)    General: Appears her stated age, obese, in NAD. Skin: Warm, dry and intact. No rashes, lesions or ulcerations noted. HEENT: Head: normal shape and size; Eyes: sclera white and EOMs intact;  Neck:  Neck supple, trachea midline. No masses, lumps present. Thyromegaly noted. Cardiovascular: Normal rate and rhythm. S1,S2 noted.  No murmur, rubs or gallops noted. No JVD or BLE edema.  Pulmonary/Chest: Normal effort and positive vesicular breath sounds. No respiratory distress. No wheezes, rales or ronchi noted.  Abdomen: Soft and nontender. Normal bowel sounds. No distention or masses noted. Liver, spleen and kidneys non palpable. Musculoskeletal: Strength 5/5 BUE/BLE no difficulty with gait.  Neurological: Alert and oriented. Cranial nerves II-XII grossly intact. Coordination normal.  Psychiatric: Mood and affect normal. Behavior is normal. Judgment and thought content normal.    BMET    Component Value Date/Time   NA 139 01/16/2021 0905   NA 143 01/26/2020 1600   K 3.9 01/16/2021 0905   CL 105 01/16/2021 0905   CO2 24 01/16/2021 0905   GLUCOSE 90 01/16/2021 0905   BUN 10 01/16/2021 0905   BUN 11 01/26/2020 1600   CREATININE 0.99 01/16/2021 0905   CALCIUM 8.9 01/16/2021 0905   GFRNONAA >60 01/16/2021 0905   GFRAA >60 02/25/2020 2231    Lipid Panel     Component Value Date/Time   CHOL 184 12/21/2019 1204   TRIG 94.0 12/21/2019 1204   HDL 38.00 (L) 12/21/2019 1204   CHOLHDL 5 12/21/2019 1204   VLDL 18.8 12/21/2019 1204   LDLCALC 127 (H) 12/21/2019 1204    CBC    Component Value Date/Time   WBC 4.3 01/16/2021 0905   WBC 6.0 01/07/2021 0733   RBC 4.13 01/16/2021 0906   RBC 4.07 01/16/2021 0905   HGB 10.2 (L) 01/16/2021 0905   HGB 10.1 (L) 04/25/2020 1154   HCT 31.8 (L) 01/16/2021 0905   HCT 31.6 (L)  04/25/2020 1154   PLT 258 01/16/2021 0905   PLT 261 04/25/2020 1154   MCV 78.1 (L) 01/16/2021 0905   MCV  80 04/25/2020 1154   MCH 25.1 (L) 01/16/2021 0905   MCHC 32.1 01/16/2021 0905   RDW 16.0 (H) 01/16/2021 0905   RDW 16.8 (H) 04/25/2020 1154   LYMPHSABS 1.6 01/16/2021 0905   LYMPHSABS 1.4 04/25/2020 1154   MONOABS 0.4 01/16/2021 0905   EOSABS 0.2 01/16/2021 0905   EOSABS 0.0 04/25/2020 1154   BASOSABS 0.1 01/16/2021 0905   BASOSABS 0.1 04/25/2020 1154    Hgb A1C Lab Results  Component Value Date   HGBA1C 5.7 12/21/2019           Assessment & Plan:   Preventative Health Maintenance:  Encouraged her to get a flu shot in the fall Tetanus UTD Encouraged her to get her Covid booster She will call GYN to schedule her Pap smear Mammogram UTD She wants to hold off on colon cancer screening until she is finished with her cancer treatment Encouraged her to consume a balanced diet and exercise regimen Advised her to see an eye doctor and dentist annually Will check TSH, Free T4, Lipid, A1C, HIV and Hep C today  RTC in 1 year, sooner if needed Webb Silversmith, NP This visit occurred during the SARS-CoV-2 public health emergency.  Safety protocols were in place, including screening questions prior to the visit, additional usage of staff PPE, and extensive cleaning of exam room while observing appropriate contact time as indicated for disinfecting solutions.

## 2021-01-17 NOTE — Assessment & Plan Note (Signed)
We will have her schedule nurse visit for lipid profile Encouraged her to consume a low-fat diet She is currently not taking Pravastatin

## 2021-01-17 NOTE — Assessment & Plan Note (Signed)
Following with oncology Will follow

## 2021-01-17 NOTE — Assessment & Plan Note (Signed)
No recent outbreak Valtrex refilled today in case she does have an [place]

## 2021-01-17 NOTE — Patient Instructions (Signed)
Health Maintenance, Female Adopting a healthy lifestyle and getting preventive care are important in promoting health and wellness. Ask your health care provider about:  The right schedule for you to have regular tests and exams.  Things you can do on your own to prevent diseases and keep yourself healthy. What should I know about diet, weight, and exercise? Eat a healthy diet  Eat a diet that includes plenty of vegetables, fruits, low-fat dairy products, and lean protein.  Do not eat a lot of foods that are high in solid fats, added sugars, or sodium.   Maintain a healthy weight Body mass index (BMI) is used to identify weight problems. It estimates body fat based on height and weight. Your health care provider can help determine your BMI and help you achieve or maintain a healthy weight. Get regular exercise Get regular exercise. This is one of the most important things you can do for your health. Most adults should:  Exercise for at least 150 minutes each week. The exercise should increase your heart rate and make you sweat (moderate-intensity exercise).  Do strengthening exercises at least twice a week. This is in addition to the moderate-intensity exercise.  Spend less time sitting. Even light physical activity can be beneficial. Watch cholesterol and blood lipids Have your blood tested for lipids and cholesterol at 49 years of age, then have this test every 5 years. Have your cholesterol levels checked more often if:  Your lipid or cholesterol levels are high.  You are older than 49 years of age.  You are at high risk for heart disease. What should I know about cancer screening? Depending on your health history and family history, you may need to have cancer screening at various ages. This may include screening for:  Breast cancer.  Cervical cancer.  Colorectal cancer.  Skin cancer.  Lung cancer. What should I know about heart disease, diabetes, and high blood  pressure? Blood pressure and heart disease  High blood pressure causes heart disease and increases the risk of stroke. This is more likely to develop in people who have high blood pressure readings, are of African descent, or are overweight.  Have your blood pressure checked: ? Every 3-5 years if you are 18-39 years of age. ? Every year if you are 40 years old or older. Diabetes Have regular diabetes screenings. This checks your fasting blood sugar level. Have the screening done:  Once every three years after age 40 if you are at a normal weight and have a low risk for diabetes.  More often and at a younger age if you are overweight or have a high risk for diabetes. What should I know about preventing infection? Hepatitis B If you have a higher risk for hepatitis B, you should be screened for this virus. Talk with your health care provider to find out if you are at risk for hepatitis B infection. Hepatitis C Testing is recommended for:  Everyone born from 1945 through 1965.  Anyone with known risk factors for hepatitis C. Sexually transmitted infections (STIs)  Get screened for STIs, including gonorrhea and chlamydia, if: ? You are sexually active and are younger than 49 years of age. ? You are older than 49 years of age and your health care provider tells you that you are at risk for this type of infection. ? Your sexual activity has changed since you were last screened, and you are at increased risk for chlamydia or gonorrhea. Ask your health care provider   if you are at risk.  Ask your health care provider about whether you are at high risk for HIV. Your health care provider may recommend a prescription medicine to help prevent HIV infection. If you choose to take medicine to prevent HIV, you should first get tested for HIV. You should then be tested every 3 months for as long as you are taking the medicine. Pregnancy  If you are about to stop having your period (premenopausal) and  you may become pregnant, seek counseling before you get pregnant.  Take 400 to 800 micrograms (mcg) of folic acid every day if you become pregnant.  Ask for birth control (contraception) if you want to prevent pregnancy. Osteoporosis and menopause Osteoporosis is a disease in which the bones lose minerals and strength with aging. This can result in bone fractures. If you are 65 years old or older, or if you are at risk for osteoporosis and fractures, ask your health care provider if you should:  Be screened for bone loss.  Take a calcium or vitamin D supplement to lower your risk of fractures.  Be given hormone replacement therapy (HRT) to treat symptoms of menopause. Follow these instructions at home: Lifestyle  Do not use any products that contain nicotine or tobacco, such as cigarettes, e-cigarettes, and chewing tobacco. If you need help quitting, ask your health care provider.  Do not use street drugs.  Do not share needles.  Ask your health care provider for help if you need support or information about quitting drugs. Alcohol use  Do not drink alcohol if: ? Your health care provider tells you not to drink. ? You are pregnant, may be pregnant, or are planning to become pregnant.  If you drink alcohol: ? Limit how much you use to 0-1 drink a day. ? Limit intake if you are breastfeeding.  Be aware of how much alcohol is in your drink. In the [place] one drink equals one 12 oz bottle of beer (355 mL), one 5 oz glass of wine (148 mL), or one 1 oz glass of hard liquor (44 mL). General instructions  Schedule regular health, dental, and eye exams.  Stay current with your vaccines.  Tell your health care provider if: ? You often feel depressed. ? You have ever been abused or do not feel safe at home. Summary  Adopting a healthy lifestyle and getting preventive care are important in promoting health and wellness.  Follow your health care provider's instructions about healthy  diet, exercising, and getting tested or screened for diseases.  Follow your health care provider's instructions on monitoring your cholesterol and blood pressure. This information is not intended to replace advice given to you by your health care provider. Make sure you discuss any questions you have with your health care provider. Document Revised: 08/17/2018 Document Reviewed: 08/17/2018 Elsevier Patient Education  2021 [employer]  

## 2021-01-17 NOTE — Assessment & Plan Note (Signed)
On Flecainide per cardiology Will follow

## 2021-01-17 NOTE — Assessment & Plan Note (Signed)
Encouraged stress relieving measures Continue Flexeril, Sumatriptan/Rizatriptan as needed Will monitor

## 2021-01-17 NOTE — Assessment & Plan Note (Signed)
Stable on Sertraline and Xanax, wean not indicated CSA due but forgot to obtain today, will get at next visit Support offered

## 2021-01-19 ENCOUNTER — Other Ambulatory Visit: Payer: Self-pay | Admitting: Hematology

## 2021-01-19 DIAGNOSIS — D519 Vitamin B12 deficiency anemia, unspecified: Secondary | ICD-10-CM

## 2021-01-19 DIAGNOSIS — D509 Iron deficiency anemia, unspecified: Secondary | ICD-10-CM

## 2021-01-19 NOTE — Addendum Note (Signed)
Addended by: Truitt Merle on: 01/19/2021 07:03 PM   Modules accepted: Orders

## 2021-01-20 ENCOUNTER — Ambulatory Visit
Admission: RE | Admit: 2021-01-20 | Discharge: 2021-01-20 | Disposition: A | Discharge status: Home / Self Care | Payer: BC Managed Care – PPO | Source: Ambulatory Visit | Attending: Radiation Oncology | Admitting: Radiation Oncology

## 2021-01-20 ENCOUNTER — Telehealth: Payer: Self-pay | Admitting: Genetic Counselor

## 2021-01-20 ENCOUNTER — Encounter: Payer: Self-pay | Admitting: Genetic Counselor

## 2021-01-20 DIAGNOSIS — Z1379 Encounter for other screening for genetic and chromosomal anomalies: Secondary | ICD-10-CM | POA: Insufficient documentation

## 2021-01-20 DIAGNOSIS — C50412 Malignant neoplasm of upper-outer quadrant of left female breast: Secondary | ICD-10-CM | POA: Diagnosis not present

## 2021-01-20 DIAGNOSIS — Z170 Estrogen receptor positive status [ER+]: Secondary | ICD-10-CM | POA: Diagnosis not present

## 2021-01-20 DIAGNOSIS — Z803 Family history of malignant neoplasm of breast: Secondary | ICD-10-CM

## 2021-01-20 DIAGNOSIS — Z8042 Family history of malignant neoplasm of prostate: Secondary | ICD-10-CM

## 2021-01-20 HISTORY — DX: Family history of malignant neoplasm of breast: Z80.3

## 2021-01-20 HISTORY — DX: Family history of malignant neoplasm of prostate: Z80.42

## 2021-01-20 NOTE — Progress Notes (Signed)
REFERRING PROVIDER: Truitt Merle, MD [place],  [place] [id number]  PRIMARY PROVIDER:  Jearld Fenton, NP  PRIMARY REASON FOR VISIT:  1. Carcinoma of upper-outer quadrant of left breast in female, estrogen receptor positive ([place])   2. Family history of breast cancer   3. Family history of prostate cancer   4. Genetic testing     HISTORY OF PRESENT ILLNESS:   [name], a 50 y.o. female, was seen for a [place] cancer genetics consultation at the request of Dr. Burr Medico due to a personal and family history of cancer.  [name] presents to clinic today to discuss the possibility of a hereditary predisposition to cancer, to discuss genetic testing, and to further clarify her future cancer risks, as well as potential cancer risks for family members.   In April 2022, at the age of 22, 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  The treatment plan includes Oncotype to determine potential benefit of chemotherapy, adjuvant radiation, and anti-estrogens.  [name] previously had negative hereditary cancer genetic testing.  No pathogenic variants were detected in [facility] Panel.  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  The report date is October 13, 2018.  The [facility] gene panel offered by [employer] includes sequencing and deletion/duplication testing of the following 35 genes: APC, ATM, AXIN2, BARD1, BMPR1A, BRCA1, BRCA2, BRIP1, CHD1, CDK4, CDKN2A, CHEK2, EPCAM (large rearrangement only), HOXB13, (sequencing only), GALNT12, MLH1, MSH2, MSH3 (excluding repetitive portions of exon 1), MSH6, MUTYH, NBN, NTHL1, PALB2, PMS2, PTEN, RAD51C, RAD51D, RNF43, RPS20, SMAD4, STK11, and TP53. Sequencing was performed for select regions of POLE and POLD1, and large rearrangement analysis was performed for select regions of GREM1.      CANCER  HISTORY:  Oncology History Overview Note  Cancer Staging Carcinoma of upper-outer quadrant of left breast, estrogen receptor positive ([place]) Staging form: Breast, AJCC 8th Edition - Pathologic stage from 01/02/2021: No Stage Recommended (pT1c, cN0, cM0, G2, ER+, PR+, HER2-) - Unsigned    Carcinoma of upper-outer quadrant of left breast, estrogen receptor positive ([place])  10/28/2020 Imaging   DIGITAL DIAGNOSTIC UNILATERAL LEFT MAMMOGRAM WITH TOMOSYNTHESIS AND CAD; ULTRASOUND LEFT BREAST LIMITED  CLINICAL DATA: 50 year old female presenting for short-term follow-up of a left breast biopsy demonstrating fibrocystic changes.  IMPRESSION: Indeterminate left breast mass at 2 o'clock 7 cm from the nipple. Targeted ultrasound of the left axilla demonstrates normal lymph nodes.   10/31/2020 Pathology Results   Breast, left, needle core biopsy, 2 o'clock - FIBROADIPOSE TISSUE WITH MINIMAL BREAST PARENCHYMA DEMONSTRATING MILD FIBROCYSTIC CHANGE TO INCLUDE APOCRINE METAPLASIA.  This was found to be discordant.   12/18/2020 Surgery   A. BREAST, LEFT, LUMPECTOMY:  - Invasive lobular carcinoma, 1.1 cm, grade 2.  See comment  - Resection margins are negative for carcinoma; inferior margin is  focally less than 1 mm from carcinoma and medial margin is approximately  1 mm from carcinoma  - Biopsy related changes  - Background fibrocystic change   PROGNOSTIC INDICATOR RESULTS:  - The tumor cells are NEGATIVE for Her2 (0).  - Estrogen Receptor:       POSITIVE, 30%, WEAK STAINING  - Progesterone Receptor:   POSITIVE, 80%, MODERATE STAINING  - Proliferation Marker Ki-67:   <5%    01/02/2021 Initial Diagnosis   Carcinoma of upper-outer quadrant of left breast, estrogen receptor positive ([place])   01/02/2021 Cancer Staging  Staging form: Breast, AJCC 8th Edition - Pathologic stage from 01/02/2021: No Stage Recommended (pT1c, cN0, cM0, G2, ER+, PR+, HER2-) - Signed by Truitt Merle, MD on 01/02/2021 Stage  prefix: Initial diagnosis Method of lymph node assessment: Clinical Multigene prognostic tests performed: None Histologic grading system: 3 grade system Residual tumor (R): R0 - None     RISK FACTORS:  Menarche was at age 51.  First live birth at age 58.  OCP use for approximately 30 years.  Ovaries intact: yes.  Hysterectomy: no.  HRT use: 0 years. Colonoscopy: yes; more than 8 years ago per patient. Mammogram within the last year: yes. Up to date with pelvic exams: yes; due per patient Any excessive radiation exposure in the past: no  Past Medical History:  Diagnosis Date  . Allergy   . Anemia    present for many years, mild, stable  . Anxiety   . Breast cancer ([place])   . Breast cancer ([place]) 12/18/2020  . Dysrhythmia    PAC's on flecanide (did not tolerate beta blockers)  . Family history of breast cancer 01/20/2021  . Family history of prostate cancer 01/20/2021  . Frequent headaches   . HSV infection    fever blisters  . Hypertension   . Hypothyroidism     Past Surgical History:  Procedure Laterality Date  . APPENDECTOMY  2013  . BREAST LUMPECTOMY WITH RADIOACTIVE SEED LOCALIZATION Left 12/18/2020   Procedure: LEFT BREAST LUMPECTOMY WITH RADIOACTIVE SEED LOCALIZATION;  Surgeon: Erroll Luna, MD;  Location: [place];  Service: General;  Laterality: Left;  . KNEE ARTHROSCOPY Right   . lapband  2013  . SENTINEL NODE BIOPSY Left 01/07/2021   Procedure: LEFT SENTINEL NODE BIOPSY;  Surgeon: Erroll Luna, MD;  Location: [place];  Service: General;  Laterality: Left;  . TONSILLECTOMY  2012    Social History   Socioeconomic History  . Marital status: Single    Spouse name: Not on file  . Number of children: 1  . Years of education: Not on file  . Highest education level: Not on file  Occupational History  . Not on file  Tobacco Use  . Smoking status: Never Smoker  . Smokeless tobacco: Never Used  Vaping Use  . Vaping Use: Never used   Substance and Sexual Activity  . Alcohol use: Yes    Comment: occasional  . Drug use: No  . Sexual activity: Yes    Partners: Male    Birth control/protection: Pill  Other Topics Concern  . Not on file  Social History Narrative  . Not on file   Social Determinants of Health   Financial Resource Strain: Not on file  Food Insecurity: Not on file  Transportation Needs: Not on file  Physical Activity: Not on file  Stress: Not on file  Social Connections: Not on file     FAMILY HISTORY:  We obtained a detailed, 4-generation family history.  Significant diagnoses are listed below: Family History  Problem Relation Age of Onset  . Breast cancer Mother 13  . Prostate cancer Father 31    Ms. Osorto is unaware of previous family history of genetic testing for hereditary cancer risks besides that mentioned above. Patient's maternal ancestors are of unknown descent, and paternal ancestors are of [place] and [place] descent. There is no reported Ashkenazi Jewish ancestry. There is no known consanguinity.  GENETIC COUNSELING ASSESSMENT: Ms. Mulvihill is a 50 y.o. female with a personal history of cancer which is somewhat  suggestive of a hereditary cancer syndrome and predisposition to cancer given her age of diagnosis and the presence of related cancers in the family. We, therefore, discussed and recommended the following at today's visit.   DISCUSSION: We discussed that 5 - 10% of cancer is hereditary, with most cases of hereditary breast associated with mutations in BRCA1/2.  There are other genes that can be associated with hereditary breast cancer syndromes.  These include but are not limited to ATM, PALB2, and CHEK2.  We discussed that testing is beneficial for several reasons including knowing how to follow individuals after completing their treatment, identifying whether potential treatment options would be beneficial, and understanding if other family members could be at risk for  cancer and allowing them to undergo genetic testing.   We reviewed Ms. Sisneros's negative genetic testing with a variant of uncertain significance in PMS2, which was reported in February 2020. We discussed with Ms. Dufresne that because current genetic testing is not perfect, it is possible there may be a gene mutation in one of these genes that current testing cannot detect, but that chance is small.  We also discussed, that there could be another gene that has not yet been discovered, or that we have not yet tested, that is responsible for the cancer diagnoses in the family. It is also possible there is a hereditary cause for the cancer in the family that Ms. Lartigue did not inherit and therefore was not identified in her testing.  Therefore, it is important to remain in touch with cancer genetics in the future so that we can continue to offer Ms. Alvarenga the most up to date genetic testing.   ADDITIONAL GENETIC TESTING: We discussed with Ms. Rausch that there are other genes that are associated with increased cancer risk that can be analyzed. Should Ms. Mayol wish to pursue additional genetic testing, we are happy to discuss and coordinate this testing, at any time.    CANCER SCREENING RECOMMENDATIONS: Ms. Arrasmith test result is considered negative (normal).  This means a hereditary cause for her personal history of cancer has not been identified at this time at this time. Most cancers happen by chance and this negative test suggests that her cancer may fall into this category.    While reassuring, this does not definitively rule out a hereditary predisposition to cancer. It is still possible that there could be genetic mutations that are undetectable by current technology. There could be genetic mutations in genes that have not been tested or identified to increase cancer risk.  Therefore, it is recommended she continue to follow the cancer management and screening  guidelines provided by her oncology and primary healthcare provider.   An individual's cancer risk and medical management are not determined by genetic test results alone. Overall cancer risk assessment incorporates additional factors, including personal medical history, family history, and any available genetic information that may result in a personalized plan for cancer prevention and surveillance  RECOMMENDATIONS FOR FAMILY MEMBERS:  Individuals in this family might be at some increased risk of developing cancer, over the general population risk, simply due to the family history of cancer.  We recommended women in this family have a yearly mammogram beginning at age 53, or 48 years younger than the earliest onset of cancer, an annual clinical breast exam, and perform monthly breast self-exams. Women in this family should also have a gynecological exam as recommended by their primary provider. Family members should be referred for colonoscopy starting at age  45.  PLAN: Due to her previous genetic testing results, Ms. Magda did not pursue additional genetic testing at today's visit. We remain available to coordinate genetic testing at any time in the future as needed. We, therefore, recommend Ms. Shackett continue to follow the cancer screening guidelines given by her primary healthcare provider.  Lastly, we discussed with Ms. Coury that cancer genetics is a rapidly advancing field and it is possible that new genetic tests will be appropriate for her and/or her family members in the future. We encouraged her to remain in contact with cancer genetics on an annual basis so we can update her personal and family histories and let her know of advances in cancer genetics that may benefit this family.   Our contact number was provided. Ms. Timmins questions were answered to her satisfaction, and she knows she is welcome to call us at anytime with additional questions or concerns.   Ashe Gago  M. Joette Catching, [place], [facility] Genetic Counselor [email or web address] (P) [phone]  The patient was seen for a total of 40 minutes in face-to-face genetic counseling.  Drs. Magrinat, Lindi Adie and/or Burr Medico were available to discuss this case as needed.   _______________________________________________________________________ For Office Staff:  Number of people involved in session: 1 Was an Intern/ student involved with case: no

## 2021-01-20 NOTE — Telephone Encounter (Signed)
Reviewed hereditary cancer genetic testing results (negative w/ VUS in PMS2) obtained from Dr. Delanna Ahmadi office from 2020.

## 2021-01-20 NOTE — Progress Notes (Signed)
The patient came in today for simulation. She did not need pregnancy testing as she's been abstinant for more than one month and had negative testing this month prior to her sentinel node biopsy. She is awaiting the results of her oncotype score but would like to proceed with simulation knowing that if she needed chemotherapy we would need to repeat this simulation. She is in agreement to practice a second method of birth control such as using condoms if she becomes sexually active during radiation.

## 2021-01-21 ENCOUNTER — Ambulatory Visit (INDEPENDENT_AMBULATORY_CARE_PROVIDER_SITE_OTHER): Payer: BC Managed Care – PPO

## 2021-01-21 ENCOUNTER — Other Ambulatory Visit: Payer: Self-pay

## 2021-01-21 DIAGNOSIS — I493 Ventricular premature depolarization: Secondary | ICD-10-CM

## 2021-01-21 DIAGNOSIS — I491 Atrial premature depolarization: Secondary | ICD-10-CM | POA: Diagnosis not present

## 2021-01-21 MED ORDER — PERFLUTREN LIPID MICROSPHERE
1.0000 mL | INTRAVENOUS | Status: AC | PRN
  Administered 2021-01-21: 2 mL via INTRAVENOUS

## 2021-01-22 LAB — ECHOCARDIOGRAM COMPLETE
AR max vel: 2.11 cm2
AV Area VTI: 2.11 cm2
AV Area mean vel: 1.97 cm2
AV Mean grad: 4 mmHg
AV Peak grad: 7.3 mmHg
Ao pk vel: 1.35 m/s
S' Lateral: 3.4 cm
Single Plane A4C EF: 55 %

## 2021-01-22 LAB — METHYLMALONIC ACID, SERUM: Methylmalonic Acid, Quantitative: 291 nmol/L (ref 0–378)

## 2021-01-23 ENCOUNTER — Telehealth: Payer: Self-pay

## 2021-01-23 ENCOUNTER — Other Ambulatory Visit: Payer: Self-pay

## 2021-01-23 ENCOUNTER — Telehealth: Payer: Self-pay | Admitting: Hematology

## 2021-01-23 NOTE — Progress Notes (Signed)
Error

## 2021-01-23 NOTE — Telephone Encounter (Signed)
Scheduled appts per 5/19 sch msg. Pt aware.

## 2021-01-23 NOTE — Telephone Encounter (Signed)
-----   Message from Truitt Merle, MD sent at 01/19/2021  7:07 PM EDT ----- Please let pt know her lab results: she is pre-menopausal based on [facility] result. She has both iron and B12 deficiency which are the causes of her anemia. I recommend B12 injection weekly X8 then monthly if needed, she can also start oral B12 at  1052mcg daily. Let her start oral iron 2 tabs daily. Please also schedule injection weekly X4 and lab and f/u with me in a month for anemia f/u. Thanks   Truitt Merle  01/19/2021

## 2021-01-23 NOTE — Telephone Encounter (Signed)
I spoke with [name].  I reviewed Dr Ernestina Penna comments and recommendations.  She states that she does not tolerate oral B12 or iron.  These cause nausea.  She is agreeable to b12 injections.  I reviewed her intolerance to oral iron with Dr Burr Medico.  Dr Burr Medico recommends IV iron.  I left vm for pt to call me back

## 2021-01-24 NOTE — Telephone Encounter (Signed)
[name] is agreeable to IV iron therapy.

## 2021-01-27 ENCOUNTER — Telehealth: Payer: Self-pay | Admitting: Radiation Oncology

## 2021-01-27 ENCOUNTER — Telehealth: Payer: Self-pay | Admitting: Hematology

## 2021-01-27 ENCOUNTER — Telehealth: Payer: Self-pay | Admitting: *Deleted

## 2021-01-27 ENCOUNTER — Encounter (HOSPITAL_COMMUNITY): Payer: Self-pay

## 2021-01-27 ENCOUNTER — Other Ambulatory Visit: Payer: Self-pay | Admitting: Hematology

## 2021-01-27 ENCOUNTER — Encounter: Payer: Self-pay | Admitting: *Deleted

## 2021-01-27 NOTE — Telephone Encounter (Signed)
Received oncotype score of 16. Physician team notified Called pt discussed results and chemo not recommended and to continue with plan of xrt. Received verbal understanding.

## 2021-01-27 NOTE — Telephone Encounter (Signed)
I called the patient to let her know we were aware of her oncotype results and that we can move forward with our plans for radiation to start on 02/05/21. She is in agreement.

## 2021-01-27 NOTE — Telephone Encounter (Signed)
Scheduled appts per 5/23 sch msg. Pt aware.

## 2021-01-29 ENCOUNTER — Inpatient Hospital Stay: Payer: BC Managed Care – PPO

## 2021-01-29 ENCOUNTER — Other Ambulatory Visit: Payer: Self-pay

## 2021-01-29 DIAGNOSIS — C50412 Malignant neoplasm of upper-outer quadrant of left female breast: Secondary | ICD-10-CM | POA: Diagnosis not present

## 2021-01-29 DIAGNOSIS — D519 Vitamin B12 deficiency anemia, unspecified: Secondary | ICD-10-CM

## 2021-01-29 LAB — ESTRADIOL, ULTRA SENS: Estradiol, Sensitive: <2.5 pg/mL

## 2021-01-29 MED ORDER — CYANOCOBALAMIN 1000 MCG/ML IJ SOLN
1000.0000 ug | Freq: Once | INTRAMUSCULAR | Status: AC
  Administered 2021-01-29: 1000 ug via INTRAMUSCULAR

## 2021-01-29 MED ORDER — CYANOCOBALAMIN 1000 MCG/ML IJ SOLN
INTRAMUSCULAR | Status: AC
  Filled 2021-01-29: qty 1

## 2021-01-29 NOTE — Patient Instructions (Signed)

## 2021-01-30 ENCOUNTER — Ambulatory Visit: Payer: BC Managed Care – PPO

## 2021-01-31 ENCOUNTER — Encounter: Payer: Self-pay | Admitting: Hematology

## 2021-02-02 ENCOUNTER — Other Ambulatory Visit: Payer: Self-pay | Admitting: Internal Medicine

## 2021-02-02 DIAGNOSIS — C50412 Malignant neoplasm of upper-outer quadrant of left female breast: Secondary | ICD-10-CM | POA: Diagnosis not present

## 2021-02-02 NOTE — Telephone Encounter (Signed)
Requested medication (s) are due for refill today: yes  Requested medication (s) are on the active medication list: yes  Last refill:  01/14/21  Future visit scheduled: no  Notes to clinic:  no refill protocol for this med/ med not delegated to NT to RF   Requested Prescriptions  Pending Prescriptions Disp Refills   cyclobenzaprine (FLEXERIL) 10 MG tablet [Pharmacy Med Name: CYCLOBENZAPRINE 10 MG TABLET] 15 tablet 0    Sig: Take 1 tablet (10 mg total) by mouth daily as needed (migraines).      There is no refill protocol information for this order

## 2021-02-04 ENCOUNTER — Encounter: Payer: Self-pay | Admitting: Radiation Oncology

## 2021-02-05 ENCOUNTER — Encounter: Payer: Self-pay | Admitting: Radiation Oncology

## 2021-02-05 ENCOUNTER — Ambulatory Visit
Admission: RE | Admit: 2021-02-05 | Discharge: 2021-02-05 | Disposition: A | Discharge status: Home / Self Care | Payer: BC Managed Care – PPO | Source: Ambulatory Visit | Attending: Radiation Oncology | Admitting: Radiation Oncology

## 2021-02-05 ENCOUNTER — Other Ambulatory Visit: Payer: Self-pay

## 2021-02-05 DIAGNOSIS — Z170 Estrogen receptor positive status [ER+]: Secondary | ICD-10-CM | POA: Insufficient documentation

## 2021-02-05 DIAGNOSIS — C50412 Malignant neoplasm of upper-outer quadrant of left female breast: Secondary | ICD-10-CM | POA: Diagnosis not present

## 2021-02-06 ENCOUNTER — Ambulatory Visit
Admission: RE | Admit: 2021-02-06 | Discharge: 2021-02-06 | Disposition: A | Discharge status: Home / Self Care | Payer: BC Managed Care – PPO | Source: Ambulatory Visit | Attending: Radiation Oncology | Admitting: Radiation Oncology

## 2021-02-06 ENCOUNTER — Inpatient Hospital Stay: Payer: BC Managed Care – PPO | Attending: Hematology

## 2021-02-06 ENCOUNTER — Telehealth: Payer: Self-pay | Admitting: Hematology

## 2021-02-06 DIAGNOSIS — Z170 Estrogen receptor positive status [ER+]: Secondary | ICD-10-CM | POA: Insufficient documentation

## 2021-02-06 DIAGNOSIS — C50412 Malignant neoplasm of upper-outer quadrant of left female breast: Secondary | ICD-10-CM | POA: Diagnosis present

## 2021-02-06 DIAGNOSIS — E538 Deficiency of other specified B group vitamins: Secondary | ICD-10-CM | POA: Insufficient documentation

## 2021-02-06 DIAGNOSIS — D519 Vitamin B12 deficiency anemia, unspecified: Secondary | ICD-10-CM

## 2021-02-06 DIAGNOSIS — D509 Iron deficiency anemia, unspecified: Secondary | ICD-10-CM | POA: Diagnosis not present

## 2021-02-06 MED ORDER — CYANOCOBALAMIN 1000 MCG/ML IJ SOLN
INTRAMUSCULAR | Status: AC
  Filled 2021-02-06: qty 1

## 2021-02-06 MED ORDER — CYANOCOBALAMIN 1000 MCG/ML IJ SOLN
1000.0000 ug | Freq: Once | INTRAMUSCULAR | Status: AC
Start: 2021-02-06 — End: 2021-02-06
  Administered 2021-02-06: 1000 ug via INTRAMUSCULAR

## 2021-02-06 NOTE — Patient Instructions (Signed)

## 2021-02-06 NOTE — Telephone Encounter (Signed)
Rescheduled per list from [place]. Called and spoke with pt, confirmed new appt time on 6/13

## 2021-02-06 NOTE — Progress Notes (Signed)
Pt here for patient teaching.  Pt given Radiation and You booklet, skin care instructions, Alra deodorant and Radiaplex gel.  Reviewed areas of pertinence such as fatigue, hair loss, skin changes, breast tenderness and breast swelling . Pt able to give teach back of to pat skin and use unscented/gentle soap,apply Radiaplex bid, avoid applying anything to skin within 4 hours of treatment, avoid wearing an under wire bra and to use an electric razor if they must shave. Pt verbalizes understanding of information given and will contact nursing with any questions or concerns.     [email or web address]/treatmentinformation/[email or web address]

## 2021-02-07 ENCOUNTER — Other Ambulatory Visit: Payer: Self-pay

## 2021-02-07 ENCOUNTER — Ambulatory Visit
Admission: RE | Admit: 2021-02-07 | Discharge: 2021-02-07 | Disposition: A | Discharge status: Home / Self Care | Payer: BC Managed Care – PPO | Source: Ambulatory Visit | Attending: Radiation Oncology | Admitting: Radiation Oncology

## 2021-02-07 DIAGNOSIS — C50412 Malignant neoplasm of upper-outer quadrant of left female breast: Secondary | ICD-10-CM

## 2021-02-07 DIAGNOSIS — Z170 Estrogen receptor positive status [ER+]: Secondary | ICD-10-CM

## 2021-02-07 MED ORDER — RADIAPLEXRX EX GEL: Freq: Once | CUTANEOUS | Status: AC

## 2021-02-07 MED ORDER — ALRA NON-METALLIC DEODORANT (RAD-ONC)
1.0000 "application " | Freq: Once | TOPICAL | Status: AC
  Administered 2021-02-07: 1 via TOPICAL

## 2021-02-08 ENCOUNTER — Other Ambulatory Visit: Payer: Self-pay | Admitting: Internal Medicine

## 2021-02-10 ENCOUNTER — Ambulatory Visit
Admission: RE | Admit: 2021-02-10 | Discharge: 2021-02-10 | Disposition: A | Discharge status: Home / Self Care | Payer: BC Managed Care – PPO | Source: Ambulatory Visit | Attending: Radiation Oncology | Admitting: Radiation Oncology

## 2021-02-10 DIAGNOSIS — C50412 Malignant neoplasm of upper-outer quadrant of left female breast: Secondary | ICD-10-CM | POA: Diagnosis not present

## 2021-02-11 ENCOUNTER — Ambulatory Visit
Admission: RE | Admit: 2021-02-11 | Discharge: 2021-02-11 | Disposition: A | Discharge status: Home / Self Care | Payer: BC Managed Care – PPO | Source: Ambulatory Visit | Attending: Radiation Oncology | Admitting: Radiation Oncology

## 2021-02-11 ENCOUNTER — Other Ambulatory Visit: Payer: Self-pay

## 2021-02-11 DIAGNOSIS — C50412 Malignant neoplasm of upper-outer quadrant of left female breast: Secondary | ICD-10-CM | POA: Diagnosis not present

## 2021-02-12 ENCOUNTER — Other Ambulatory Visit: Payer: Self-pay

## 2021-02-12 ENCOUNTER — Ambulatory Visit
Admission: RE | Admit: 2021-02-12 | Discharge: 2021-02-12 | Disposition: A | Discharge status: Home / Self Care | Payer: BC Managed Care – PPO | Source: Ambulatory Visit | Attending: Radiation Oncology | Admitting: Radiation Oncology

## 2021-02-12 DIAGNOSIS — C50412 Malignant neoplasm of upper-outer quadrant of left female breast: Secondary | ICD-10-CM | POA: Diagnosis not present

## 2021-02-13 ENCOUNTER — Inpatient Hospital Stay: Payer: BC Managed Care – PPO

## 2021-02-13 ENCOUNTER — Ambulatory Visit
Admission: RE | Admit: 2021-02-13 | Discharge: 2021-02-13 | Disposition: A | Discharge status: Home / Self Care | Payer: BC Managed Care – PPO | Source: Ambulatory Visit | Attending: Radiation Oncology | Admitting: Radiation Oncology

## 2021-02-13 VITALS — BP 135/93 | HR 86 | Temp 98.2°F | Resp 20

## 2021-02-13 DIAGNOSIS — D519 Vitamin B12 deficiency anemia, unspecified: Secondary | ICD-10-CM

## 2021-02-13 DIAGNOSIS — C50412 Malignant neoplasm of upper-outer quadrant of left female breast: Secondary | ICD-10-CM | POA: Diagnosis not present

## 2021-02-13 MED ORDER — CYANOCOBALAMIN 1000 MCG/ML IJ SOLN
1000.0000 ug | Freq: Once | INTRAMUSCULAR | Status: AC
  Administered 2021-02-13: 1000 ug via INTRAMUSCULAR

## 2021-02-13 MED ORDER — CYANOCOBALAMIN 1000 MCG/ML IJ SOLN
INTRAMUSCULAR | Status: AC
  Filled 2021-02-13: qty 1

## 2021-02-13 NOTE — Progress Notes (Signed)
[facility] RN confirmed pt is getting B12 weekly and is okay to get inj today.

## 2021-02-14 ENCOUNTER — Other Ambulatory Visit: Payer: Self-pay | Admitting: Internal Medicine

## 2021-02-14 ENCOUNTER — Ambulatory Visit
Admission: RE | Admit: 2021-02-14 | Discharge: 2021-02-14 | Disposition: A | Discharge status: Home / Self Care | Payer: BC Managed Care – PPO | Source: Ambulatory Visit | Attending: Radiation Oncology | Admitting: Radiation Oncology

## 2021-02-14 ENCOUNTER — Encounter: Payer: Self-pay | Admitting: Hematology

## 2021-02-14 DIAGNOSIS — C50412 Malignant neoplasm of upper-outer quadrant of left female breast: Secondary | ICD-10-CM | POA: Diagnosis not present

## 2021-02-17 ENCOUNTER — Ambulatory Visit: Payer: BC Managed Care – PPO

## 2021-02-17 ENCOUNTER — Inpatient Hospital Stay: Payer: BC Managed Care – PPO

## 2021-02-17 ENCOUNTER — Telehealth: Payer: Self-pay | Admitting: Hematology

## 2021-02-17 ENCOUNTER — Other Ambulatory Visit: Payer: Self-pay

## 2021-02-17 ENCOUNTER — Ambulatory Visit
Admission: RE | Admit: 2021-02-17 | Discharge: 2021-02-17 | Disposition: A | Discharge status: Home / Self Care | Payer: BC Managed Care – PPO | Source: Ambulatory Visit | Attending: Radiation Oncology | Admitting: Radiation Oncology

## 2021-02-17 VITALS — BP 135/73 | HR 77 | Temp 98.2°F | Resp 18

## 2021-02-17 DIAGNOSIS — D519 Vitamin B12 deficiency anemia, unspecified: Secondary | ICD-10-CM

## 2021-02-17 DIAGNOSIS — C50412 Malignant neoplasm of upper-outer quadrant of left female breast: Secondary | ICD-10-CM | POA: Diagnosis not present

## 2021-02-17 MED ORDER — LORATADINE 10 MG PO TABS
ORAL_TABLET | ORAL | Status: AC
  Filled 2021-02-17: qty 1

## 2021-02-17 MED ORDER — CYANOCOBALAMIN 1000 MCG/ML IJ SOLN
INTRAMUSCULAR | Status: AC
  Filled 2021-02-17: qty 1

## 2021-02-17 MED ORDER — SODIUM CHLORIDE 0.9 % IV SOLN
Freq: Once | INTRAVENOUS | Status: AC
  Filled 2021-02-17: qty 250

## 2021-02-17 MED ORDER — CYANOCOBALAMIN 1000 MCG/ML IJ SOLN
1000.0000 ug | Freq: Once | INTRAMUSCULAR | Status: AC
  Administered 2021-02-17: 1000 ug via INTRAMUSCULAR

## 2021-02-17 MED ORDER — LORATADINE 10 MG PO TABS
10.0000 mg | ORAL_TABLET | Freq: Once | ORAL | Status: AC
  Administered 2021-02-17: 10 mg via ORAL

## 2021-02-17 MED ORDER — SODIUM CHLORIDE 0.9 % IV SOLN
400.0000 mg | Freq: Once | INTRAVENOUS | Status: AC
  Administered 2021-02-17: 400 mg via INTRAVENOUS
  Filled 2021-02-17: qty 20

## 2021-02-17 NOTE — Telephone Encounter (Signed)
Scheduled appointment per 06/10 sch msg. Patient is aware. 

## 2021-02-17 NOTE — Patient Instructions (Addendum)
Iron Sucrose injection What is this medication? IRON SUCROSE (AHY ern SOO krohs) is an iron complex. Iron is used to make healthy red blood cells, which carry oxygen and nutrients throughout the body. This medicine is used to treat iron deficiency anemia in people with chronickidney disease. This medicine may be used for other purposes; ask your health care provider orpharmacist if you have questions. COMMON BRAND NAME(S): Venofer What should I tell my care team before I take this medication? They need to know if you have any of these conditions: anemia not caused by low iron levels heart disease high levels of iron in the blood kidney disease liver disease an unusual or allergic reaction to iron, other medicines, foods, dyes, or preservatives pregnant or trying to get pregnant breast-feeding How should I use this medication? This medicine is for infusion into a vein. It is given by a health careprofessional in a hospital or clinic setting. Talk to your pediatrician regarding the use of this medicine in children. While this drug may be prescribed for children as young as 2 years for selectedconditions, precautions do apply. Overdosage: If you think you have taken too much of this medicine contact apoison control center or emergency room at once. NOTE: This medicine is only for you. Do not share this medicine with others. What if I miss a dose? It is important not to miss your dose. Call your doctor or health careprofessional if you are unable to keep an appointment. What may interact with this medication? Do not take this medicine with any of the following medications: deferoxamine dimercaprol other iron products This medicine may also interact with the following medications: chloramphenicol deferasirox This list may not describe all possible interactions. Give your health care provider a list of all the medicines, herbs, non-prescription drugs, or dietary supplements you use. Also tell  them if you smoke, drink alcohol, or use illegaldrugs. Some items may interact with your medicine. What should I watch for while using this medication? Visit your doctor or healthcare professional regularly. Tell your doctor or healthcare professional if your symptoms do not start to get better or if theyget worse. You may need blood work done while you are taking this medicine. You may need to follow a special diet. Talk to your doctor. Foods that contain iron include: whole grains/cereals, dried fruits, beans, or peas, leafy greenvegetables, and organ meats (liver, kidney). What side effects may I notice from receiving this medication? Side effects that you should report to your doctor or health care professionalas soon as possible: allergic reactions like skin rash, itching or hives, swelling of the face, lips, or tongue breathing problems changes in blood pressure cough fast, irregular heartbeat feeling faint or lightheaded, falls fever or chills flushing, sweating, or hot feelings joint or muscle aches/pains seizures swelling of the ankles or feet unusually weak or tired Side effects that usually do not require medical attention (report to yourdoctor or health care professional if they continue or are bothersome): diarrhea feeling achy headache irritation at site where injected nausea, vomiting stomach upset tiredness This list may not describe all possible side effects. Call your doctor for medical advice about side effects. You may report side effects to FDA at1-800-FDA-1088. Where should I keep my medication? This drug is given in a hospital or clinic and will not be stored at home. NOTE: This sheet is a summary. It may not cover all possible information. If you have questions about this medicine, talk to your doctor, pharmacist, orhealth care   provider.  2022 Elsevier/Gold Standard (2011-06-04 17:14:35)   Cyanocobalamin, Vitamin B12 injection O que  este medicamento? A  CIANOCOBALAMINA  uma forma sinttica de vitamina B12. A vitamina B12  essencial para o desenvolvimento de glbulos sanguneos, clulas nervosas e protenas saudveis pelo organismo. Tambm ajuda no metabolismo das gorduras e carboidratos. Este medicamento  usado para tratar pessoas que no conseguem absorver vitamina B12 suficiente. Este medicamento pode ser usado para outros propsitos; em caso de dvidas, pergunte ao seu profissional de sade ou farmacutico. NOMES DE MARCAS COMUNS: B-12 Compliance Kit, B-12 Injection Kit, Cyomin, LA-12, Nutri-Twelve, Physicians EZ Use B-12, Primabalt O que devo dizer a meu profissional de sade antes de tomar este medicamento? Precisam saber se voc tem algum dos seguintes problemas ou estados de sade: doenas renais sndrome ou doena de Leber anemia megaloblstica reao estranha ou alergia  cianocobalamina ou ao cobalto reao estranha ou alergia a outros medicamentos, alimentos, corantes ou conservantes est grvida ou tentando engravidar est amamentando Como devo usar este medicamento? Este medicamento  injetado por via intramuscular ou por injeo subcutnea profunda. Costuma ser administrado por um profissional de sade em consultrio ou clnica. Porm,  possvel que seu mdico lhe ensine como aplicar suas prprias injees. Siga todas as instrues. Fale com seu pediatra a respeito do uso deste medicamento em crianas. Pode ser preciso tomar alguns cuidados especiais. Superdosagem: Se achar que tomou uma superdosagem deste medicamento, entre em contato imediatamente com o [facility] ou v a um pronto-socorro. OBSERVAO: Este medicamento  s para voc. No compartilhe este medicamento com outras pessoas. E se eu deixar de tomar uma dose? Se toma o medicamento em uma clnica ou no consultrio do seu mdico, ligue para remarcar a consulta. Se aplica as injees por conta prpria e perdeu uma dose, tome-a assim que possvel.  Se j estiver quase na hora da sua prxima dose, tome somente essa dose. No tome o remdio em dobro, nem tome uma dose adicional. O que pode interagir com este medicamento? colchicina consumo pesado de lcool Esta lista pode no descrever todas as interaes possveis. D ao seu profissional de sade uma lista de todos os medicamentos, ervas medicinais, remdios de venda livre, ou suplementos alimentares que voc usa. Diga tambm se voc fuma, bebe, ou usa drogas ilcitas. Alguns destes podem interagir com o seu medicamento. Ao que devo ficar atento quando estiver usando este medicamento? Consulte seu mdico ou profissional de sade para acompanhamento regular da sua sade. Voc precisar fazer exames de sangue peridicos enquanto estiver tomando este medicamento. Voc pode precisar seguir uma dieta especial. Fale com seu mdico. Para conseguir o mximo benefcio deste medicamento, limite o seu consumo de lcool e evite fumar. Que efeitos colaterais posso sentir aps usar este medicamento? Efeitos colaterais que devem ser informados ao seu mdico ou profissional de sade o mais rpido possvel: reaes alrgicas, como erupo na pele, coceira, urticria, ou inchao do rosto, dos lbios ou da lngua pele azulada dor ou aperto no peito chiado no peito ou dificuldade para respirar tontura rea vermelha, inchada e dolorosa na perna Efeitos colaterais que normalmente no precisam de cuidados mdicos (avise ao seu mdico ou profissional de sade se persistirem ou forem incmodos): diarreia dor de cabea Esta lista pode no descrever todos os efeitos colaterais possveis. Para mais orientaes sobre efeitos colaterais, consulte o seu mdico. Voc pode relatar a ocorrncia de efeitos colaterais  FDA pelo telefone 1-800-332-1088. Onde devo guardar meu medicamento? Manter fora do alcance das crianas.   Conservar em temperatura ambiente, entre 15 e 30 degreesC (59 e 86 degreesF). Proteger da luz.  Descartar qualquer medicamento no utilizado aps a data de validade impressa no rtulo ou embalagem. OBSERVAO: Este folheto  um resumo. Pode no cobrir todas as informaes possveis. Se tiver dvidas a respeito deste medicamento, fale com seu mdico, farmacutico ou profissional de sade.  2021 Elsevier/Gold Standard (2010-05-28 00:00:00)   

## 2021-02-17 NOTE — Progress Notes (Signed)
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  IMPRESSION: Indeterminate left breast mass at 2 o'clock 7 cm from the nipple. Targeted ultrasound of the left axilla demonstrates normal lymph nodes.   10/31/2020 Pathology Results   Breast, left, needle core biopsy, 2 o'clock - FIBROADIPOSE TISSUE WITH MINIMAL BREAST PARENCHYMA DEMONSTRATING MILD FIBROCYSTIC CHANGE TO INCLUDE APOCRINE METAPLASIA.  This was found to be discordant.   12/18/2020 Surgery   A. BREAST, LEFT, LUMPECTOMY:  - Invasive lobular carcinoma, 1.1 cm, grade 2.  See comment  - Resection margins are negative for carcinoma; inferior margin is  focally less than 1 mm from carcinoma and medial margin is approximately  1 mm from carcinoma  - Biopsy related changes  - Background fibrocystic change   PROGNOSTIC INDICATOR RESULTS:  - The tumor cells are NEGATIVE for  Her2 (0).  - Estrogen Receptor:       POSITIVE, 30%, WEAK STAINING  - Progesterone Receptor:   POSITIVE, 80%, MODERATE STAINING  - Proliferation Marker Ki-67:   <5%    12/18/2020 Oncotype testing   Oncotype  Recurrence Score 16  Distant recurrence risk at 9 years of 4% with AI or Tamoxifen alone.  Less than 1% benefit of chemotherapy.    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 LYMPH NODE, LEFT, SENTINEL, EXCISION:  - One lymph node negative for metastatic carcinoma (0/1).   B. LYMPH NODE, LEFT, SENTINEL, EXCISION:  - One lymph node negative for metastatic carcinoma (0/1).   COMMENT:  Immunohistochemistry for cytokeratin AE1/AE3 is performed on parts A and  B and no metastatic carcinoma is identified   02/05/2021 - 03/19/2021 Radiation Therapy   Adjuvant Radiation with Dr Lisbeth Renshaw    03/2021 -  Anti-estrogen oral therapy   Tamoxifen 24m once daily starting in Late July or August 2022      CURRENT THERAPY:  Adjuvant Radiation with Dr MLisbeth Renshawstarting 02/05/21-03/19/21 Tamoxifen 289monce daily starting in Late July or August  2022  INTERVAL HISTORY:  [name] is here for a follow up of left breast cancer. She was last seen by me 2 months ago. She presents to the clinic alone. She notes she is doing well. She notes burning, swelling and pain this week from radiation. She should complete on 03/19/21. She notes  her breast surgery went well and her lymph node surgery which has irritated her left axilla. She notes Radiation has irritated this as well.  She notes she prefers B12 with her IV Iron infusion. She notes she has not noticed much difference after starting IV Iron. I reviewed her medication list with her. She has been on Zoloft for some years. She is willing to switch to Effexor.    REVIEW OF SYSTEMS:   Constitutional: Denies fevers, chills or abnormal weight loss Eyes: Denies blurriness of vision Ears, nose, mouth, throat, and face: Denies mucositis or sore throat Respiratory: Denies cough, dyspnea or wheezes Cardiovascular: Denies palpitation, chest discomfort or lower extremity swelling Gastrointestinal:  Denies nausea, heartburn or change in bowel habits Skin: Denies abnormal skin rashes Lymphatics: Denies new lymphadenopathy or easy bruising Neurological:Denies numbness, tingling or new weaknesses Behavioral/Psych: Mood is stable, no new changes  All other systems were reviewed with the patient and are negative.  MEDICAL HISTORY:  Past Medical History:  Diagnosis Date   Allergy    Anemia    present for many years, mild, stable   Anxiety    Breast cancer ([place])    Breast cancer ([place]) 12/18/2020   Dysrhythmia    PAC's on flecanide (did not tolerate beta blockers)   Family history of breast cancer 01/20/2021   Family history of prostate cancer 01/20/2021   Frequent headaches    HSV infection    fever blisters   Hypertension    Hypothyroidism     SURGICAL HISTORY: Past Surgical History:  Procedure Laterality Date   APPENDECTOMY  2013   BREAST LUMPECTOMY WITH RADIOACTIVE SEED LOCALIZATION Left 12/18/2020   Procedure: LEFT BREAST LUMPECTOMY WITH RADIOACTIVE SEED LOCALIZATION;  Surgeon: Erroll Luna, MD;  Location: [facility];  Service: General;  Laterality: Left;   KNEE ARTHROSCOPY Right    lapband  2013   SENTINEL NODE BIOPSY Left 01/07/2021   Procedure: LEFT  SENTINEL NODE BIOPSY;  Surgeon: Erroll Luna, MD;  Location: [facility];  Service: General;  Laterality: Left;   TONSILLECTOMY  2012    I have reviewed the social history and family history with the patient and they are unchanged from previous note.  ALLERGIES:  is allergic to ciprofloxacin, keflex [cephalexin], and trazodone and nefazodone.  MEDICATIONS:  Current Outpatient Medications  Medication Sig Dispense Refill   tamoxifen (NOLVADEX) 20 MG tablet Take 1 tablet (20 mg total) by mouth daily. 30 tablet 3   venlafaxine XR (EFFEXOR-XR) 37.5 MG 24 hr capsule Take 1 capsule (37.5 mg total) by mouth daily with breakfast. 30 capsule 1   ALPRAZolam (XANAX) 0.25 MG tablet Take 1 tablet (0.25 mg total) by mouth 2 (two) times daily as needed. (Patient taking differently: Take 0.25 mg by mouth 2 (two) times daily as needed for anxiety.) 20 tablet 0   AMETHIA 0.15-0.03 &0.01 MG tablet Take 1 tablet by mouth daily. (Patient taking differently: Take 1 tablet by mouth at bedtime.) 1 Package 0   butalbital-acetaminophen-caffeine (FIORICET) 50-325-40 MG tablet TAKE 1 TABLET BY MOUTH EVERY 6 (SIX) HOURS AS NEEDED FOR HEADACHE. 20 tablet 2   cyclobenzaprine (FLEXERIL) 10 MG tablet TAKE 1  TABLET (10 MG TOTAL) BY MOUTH DAILY AS NEEDED (MIGRAINES). 15 tablet 0   flecainide (TAMBOCOR) 50 MG tablet TAKE 1 TABLET BY MOUTH TWICE A DAY (Patient taking differently: Take 50 mg by mouth 2 (two) times daily.) 180 tablet 1   fluticasone (FLONASE) 50 MCG/ACT nasal spray SPRAY 2 SPRAYS INTO EACH NOSTRIL EVERY DAY (Patient taking differently: Place 2 sprays into both nostrils daily as needed for allergies.) 48 mL 0   gabapentin (NEURONTIN) 100 MG capsule Take 1 capsule (100 mg total) by mouth 3 (three) times daily. (Patient taking differently: Take 100 mg by mouth 3 (three) times daily as needed (pain).) 270 capsule 0   ibuprofen (ADVIL) 800 MG tablet Take 1 tablet (800 mg total) by mouth every 8 (eight) hours as needed.  (Patient taking differently: Take 800 mg by mouth every 8 (eight) hours as needed (pain).) 30 tablet 0   ibuprofen (ADVIL) 800 MG tablet Take 1 tablet (800 mg total) by mouth every 8 (eight) hours as needed. 30 tablet 0   levocetirizine (XYZAL) 5 MG tablet TAKE 1 TABLET BY MOUTH EVERY DAY IN THE EVENING 90 tablet 0   levothyroxine (SYNTHROID) 112 MCG tablet TAKE 1 TABLET (112 MCG TOTAL) BY MOUTH DAILY BEFORE BREAKFAST. (Patient taking differently: Take 112 mcg by mouth at bedtime.) 90 tablet 0   losartan (COZAAR) 25 MG tablet TAKE 1 TABLET BY MOUTH EVERY DAY (Patient taking differently: Take 25 mg by mouth at bedtime.) 90 tablet 3   oxyCODONE (OXY IR/ROXICODONE) 5 MG immediate release tablet Take 1 tablet (5 mg total) by mouth every 6 (six) hours as needed for severe pain. 15 tablet 0   pravastatin (PRAVACHOL) 40 MG tablet TAKE 1 TABLET BY MOUTH EVERY DAY 90 tablet 0   Prenatal Vit-Fe Fumarate-FA (PRENATAL MULTIVITAMIN) TABS tablet Take 1 tablet by mouth at bedtime.     rizatriptan (MAXALT-MLT) 10 MG disintegrating tablet Take 1 tablet (10 mg total) by mouth as needed for migraine. May repeat in 2 hours if needed 10 tablet 1   SUMAtriptan (IMITREX) 25 MG tablet TAKE 1 TABLET (25 MG TOTAL) BY MOUTH EVERY 2 (TWO) HOURS AS NEEDED FOR MIGRAINE. MAY REPEAT IN 2 HOURS IF HEADACHE PERSISTS OR RECURS. 10 tablet 0   valACYclovir (VALTREX) 500 MG tablet Take 1 tablet (500 mg total) by mouth daily as needed (herpes simplex). 30 tablet 3   No current facility-administered medications for this visit.    PHYSICAL EXAMINATION: ECOG PERFORMANCE STATUS: 1 - Symptomatic but completely ambulatory  Vitals:   02/21/21 1151  BP: (!) 148/78  Pulse: 94  Resp: 17  Temp: 97.7 F (36.5 C)  SpO2: 99%   Filed Weights   02/21/21 1151  Weight: 220 lb (99.8 kg)    GENERAL:alert, no distress and comfortable SKIN: skin color, texture, turgor are normal, no rashes or significant lesions EYES: normal, Conjunctiva are  pink and non-injected, sclera clear  NECK: supple, thyroid normal size, non-tender, without nodularity LYMPH:  no palpable lymphadenopathy in the cervical, axillary  LUNGS: clear to auscultation and percussion with normal breathing effort HEART: regular rate & rhythm and no murmurs and no lower extremity edema ABDOMEN:abdomen soft, non-tender and normal bowel sounds Musculoskeletal:no cyanosis of digits and no clubbing  NEURO: alert & oriented x 3 with fluent speech, no focal motor/sensory deficits BREAST: S/p Left lumpectomy: surgical incision healed well.   LABORATORY DATA:  I have reviewed the data as listed CBC Latest Ref Rng & Units 02/21/2021 01/16/2021 01/07/2021  WBC 4.0 - 10.5 K/uL 3.8(L) 4.3 6.0  Hemoglobin 12.0 - 15.0 g/dL 11.0(L) 10.2(L) 9.5(L)  Hematocrit 36.0 - 46.0 % 33.8(L) 31.8(L) 30.3(L)  Platelets 150 - 400 K/uL 228 258 281     CMP Latest Ref Rng & Units 02/21/2021 01/16/2021 01/07/2021  Glucose 70 - 99 mg/dL 99 90 94  BUN 6 - 20 mg/dL '12 10 10  ' Creatinine 0.44 - 1.00 mg/dL 1.14(H) 0.99 1.21(H)  Sodium 135 - 145 mmol/L 137 139 134(L)  Potassium 3.5 - 5.1 mmol/L 3.7 3.9 3.7  Chloride 98 - 111 mmol/L 104 105 100  CO2 22 - 32 mmol/L '26 24 26  ' Calcium 8.9 - 10.3 mg/dL 9.1 8.9 8.4(L)  Total Protein 6.5 - 8.1 g/dL 7.4 6.8 6.4(L)  Total Bilirubin 0.3 - 1.2 mg/dL 0.4 <0.2(L) 0.3  Alkaline Phos 38 - 126 U/L 89 96 92  AST 15 - 41 U/L 17 11(L) 15  ALT 0 - 44 U/L 20 <6 9      RADIOGRAPHIC STUDIES: I have personally reviewed the radiological images as listed and agreed with the findings in the report. No results found.   ASSESSMENT & PLAN:  [name] is a 50 y.o. female with   1. 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 Diagnostic mammogram on 10/28/20 revealed indeterminate left breast mass at 2 o'clock. -Biopsy of  the left breast mass on 10/31/20 was again benign but felt to be discordant. -She underwent left lumpectomy under Dr. Brantley Stage on 12/18/2020 . Pathology from surgery showed invasive lobular carcinoma, 1.1 cm, grade 2, negative resection margins. ER weakly positive, PR moderately positive, Her2 negative. Ki-67 of <5%. RS 16 with less than 1% benefit of chemotherapy.  -Her 01/07/21 Sentinel LN biopsy was negative.  -Her genetic testing from 01/16/21 was negative for pathogenetic mutations.  -She proceeded with adjuvant radiation with Dr. Lisbeth Renshaw on 02/05/21. Plan to complete 03/19/21. She already has skin irritation, burning and fatigue. Will monitor.  -Giving her strongly ER and PR positivity of the tumor cells, and her pre-menopause status (seen on 01/16/21 labs), I recommend adjuvant endocrine therapy with tamoxifen.  --The potential side effects, which includes but not limited to, hot flash, skin and vaginal dryness, slightly increased risk of cardiovascular disease and cataract, small risk of thrombosis and endometrial cancer, were discussed with her in great details. Preventive strategies for thrombosis, such as being physically active, using compression stocks, avoid cigarette smoking, etc., were reviewed with her. I also recommend her to follow-up with her gynecologist once a year, and watch for vaginal spotting or bleeding, as a clinically sign of endometrial cancer, etc. She voiced good understanding, and agrees to proceed. Will start after she completes adjuvant breast radiation. -We also discussed the breast cancer surveillance after her surgery. She will continue annual screening mammogram, self exams, and a routine office visit with lab and exam with [place].  -She will proceed with survivorship clinic with NP Lacie in 3 months. I will f/u with her in 6 months.     2. Mild anemia, iron deficiency and B12 deficiency  -She has h/o Lap band surgery. She also has very irregular periods since she was a teenager.  Her last period was around age 89, she thinks. She is still premenopausal on 01/16/21 labs. She has been anemic since she was a teen (mild and stable).  -I  discussed her prior lap band surgery is the likely cause of her B12 deficiency. I will also obtain intrinsic factor labs. -I started her on B12 injections weekly from 01/29/21 and then monthly from 04/18/21. I started her on IV Venofer weekly for 3 weeks starting 02/17/21.  -Hg today improved to 11 (02/21/21). Will continue.  -may let her try oral B12 or space out her B12 injections depends on her B12 level      PLAN:  -Given mild drug interaction with Tamoxifen and Zoloft, will ween her off Zoloft and switch to Effexor. Will prescribe today (02/21/21). She is agreeable.  -Continue Radiation, plan to complete in July  -I called in Tamoxifen to start in late July or August.  -IV Venofer and B12 injection on 6/20 and 6/27  -Continue B12 injection weekly for a total of 8 weeks then monthly  -Lab and B12 injection on 04/18/21 and 05/16/21 -Survivorship clinic with NP Lacie on 05/16/21    No problem-specific Assessment & Plan notes found for this encounter.   No orders of the defined types were placed in this encounter.  All questions were answered. The patient knows to call the clinic with any problems, questions or concerns. No barriers to learning was detected. The total time spent in the appointment was 30 minutes.     Truitt Merle, MD 02/21/2021   I, Joslyn Devon, am acting as scribe for Truitt Merle, MD.   I have reviewed the above documentation for accuracy and completeness, and I agree with the above.

## 2021-02-18 ENCOUNTER — Ambulatory Visit
Admission: RE | Admit: 2021-02-18 | Discharge: 2021-02-18 | Disposition: A | Discharge status: Home / Self Care | Payer: BC Managed Care – PPO | Source: Ambulatory Visit | Attending: Radiation Oncology | Admitting: Radiation Oncology

## 2021-02-18 DIAGNOSIS — C50412 Malignant neoplasm of upper-outer quadrant of left female breast: Secondary | ICD-10-CM | POA: Diagnosis not present

## 2021-02-19 ENCOUNTER — Ambulatory Visit
Admission: RE | Admit: 2021-02-19 | Discharge: 2021-02-19 | Disposition: A | Discharge status: Home / Self Care | Payer: BC Managed Care – PPO | Source: Ambulatory Visit | Attending: Radiation Oncology | Admitting: Radiation Oncology

## 2021-02-19 DIAGNOSIS — C50412 Malignant neoplasm of upper-outer quadrant of left female breast: Secondary | ICD-10-CM | POA: Diagnosis not present

## 2021-02-20 ENCOUNTER — Other Ambulatory Visit: Payer: Self-pay

## 2021-02-20 ENCOUNTER — Ambulatory Visit
Admission: RE | Admit: 2021-02-20 | Discharge: 2021-02-20 | Disposition: A | Discharge status: Home / Self Care | Payer: BC Managed Care – PPO | Source: Ambulatory Visit | Attending: Radiation Oncology | Admitting: Radiation Oncology

## 2021-02-20 DIAGNOSIS — C50412 Malignant neoplasm of upper-outer quadrant of left female breast: Secondary | ICD-10-CM | POA: Diagnosis not present

## 2021-02-21 ENCOUNTER — Inpatient Hospital Stay: Payer: BC Managed Care – PPO

## 2021-02-21 ENCOUNTER — Ambulatory Visit
Admission: RE | Admit: 2021-02-21 | Discharge: 2021-02-21 | Disposition: A | Discharge status: Home / Self Care | Payer: BC Managed Care – PPO | Source: Ambulatory Visit | Attending: Radiation Oncology | Admitting: Radiation Oncology

## 2021-02-21 ENCOUNTER — Inpatient Hospital Stay (HOSPITAL_BASED_OUTPATIENT_CLINIC_OR_DEPARTMENT_OTHER): Payer: BC Managed Care – PPO | Admitting: Hematology

## 2021-02-21 ENCOUNTER — Encounter: Payer: Self-pay | Admitting: Hematology

## 2021-02-21 VITALS — BP 148/78 | HR 94 | Temp 97.7°F | Resp 17 | Ht 65.0 in | Wt 220.0 lb

## 2021-02-21 DIAGNOSIS — Z170 Estrogen receptor positive status [ER+]: Secondary | ICD-10-CM | POA: Diagnosis not present

## 2021-02-21 DIAGNOSIS — C50412 Malignant neoplasm of upper-outer quadrant of left female breast: Secondary | ICD-10-CM | POA: Diagnosis not present

## 2021-02-21 DIAGNOSIS — D519 Vitamin B12 deficiency anemia, unspecified: Secondary | ICD-10-CM

## 2021-02-21 DIAGNOSIS — D509 Iron deficiency anemia, unspecified: Secondary | ICD-10-CM

## 2021-02-21 LAB — VITAMIN B12: Vitamin B-12: 752 pg/mL (ref 180–914)

## 2021-02-21 LAB — CBC WITH DIFFERENTIAL (CANCER CENTER ONLY)
Abs Immature Granulocytes: 0.01 10*3/uL (ref 0.00–0.07)
Basophils Absolute: 0.1 10*3/uL (ref 0.0–0.1)
Basophils Relative: 2 %
Eosinophils Absolute: 0.1 10*3/uL (ref 0.0–0.5)
Eosinophils Relative: 2 %
HCT: 33.8 % — ABNORMAL LOW (ref 36.0–46.0)
Hemoglobin: 11 g/dL — ABNORMAL LOW (ref 12.0–15.0)
Immature Granulocytes: 0 %
Lymphocytes Relative: 23 %
Lymphs Abs: 0.9 10*3/uL (ref 0.7–4.0)
MCH: 25.1 pg — ABNORMAL LOW (ref 26.0–34.0)
MCHC: 32.5 g/dL (ref 30.0–36.0)
MCV: 77.2 fL — ABNORMAL LOW (ref 80.0–100.0)
Monocytes Absolute: 0.4 10*3/uL (ref 0.1–1.0)
Monocytes Relative: 9 %
Neutro Abs: 2.5 10*3/uL (ref 1.7–7.7)
Neutrophils Relative %: 64 %
Platelet Count: 228 10*3/uL (ref 150–400)
RBC: 4.38 MIL/uL (ref 3.87–5.11)
RDW: 16.1 % — ABNORMAL HIGH (ref 11.5–15.5)
WBC Count: 3.8 10*3/uL — ABNORMAL LOW (ref 4.0–10.5)
nRBC: 0 % (ref 0.0–0.2)

## 2021-02-21 LAB — CMP (CANCER CENTER ONLY)
ALT: 20 U/L (ref 0–44)
AST: 17 U/L (ref 15–41)
Albumin: 3.9 g/dL (ref 3.5–5.0)
Alkaline Phosphatase: 89 U/L (ref 38–126)
Anion gap: 7 (ref 5–15)
BUN: 12 mg/dL (ref 6–20)
CO2: 26 mmol/L (ref 22–32)
Calcium: 9.1 mg/dL (ref 8.9–10.3)
Chloride: 104 mmol/L (ref 98–111)
Creatinine: 1.14 mg/dL — ABNORMAL HIGH (ref 0.44–1.00)
GFR, Estimated: 59 mL/min — ABNORMAL LOW (ref >60)
Glucose, Bld: 99 mg/dL (ref 70–99)
Potassium: 3.7 mmol/L (ref 3.5–5.1)
Sodium: 137 mmol/L (ref 135–145)
Total Bilirubin: 0.4 mg/dL (ref 0.3–1.2)
Total Protein: 7.4 g/dL (ref 6.5–8.1)

## 2021-02-21 LAB — FERRITIN: Ferritin: 233 ng/mL (ref 11–307)

## 2021-02-21 MED ORDER — TAMOXIFEN CITRATE 20 MG PO TABS: 20.0000 mg | ORAL_TABLET | Freq: Every day | ORAL | 3 refills | Status: DC

## 2021-02-21 MED ORDER — VENLAFAXINE HCL ER 37.5 MG PO CP24: 37.5000 mg | ORAL_CAPSULE | Freq: Every day | ORAL | 1 refills | Status: DC

## 2021-02-22 LAB — INTRINSIC FACTOR ANTIBODIES: Intrinsic Factor: 1 AU/mL (ref 0.0–1.1)

## 2021-02-24 ENCOUNTER — Inpatient Hospital Stay: Payer: BC Managed Care – PPO

## 2021-02-24 ENCOUNTER — Other Ambulatory Visit: Payer: Self-pay

## 2021-02-24 ENCOUNTER — Ambulatory Visit
Admission: RE | Admit: 2021-02-24 | Discharge: 2021-02-24 | Disposition: A | Discharge status: Home / Self Care | Payer: BC Managed Care – PPO | Source: Ambulatory Visit | Attending: Radiation Oncology | Admitting: Radiation Oncology

## 2021-02-24 VITALS — BP 123/71 | HR 86 | Temp 98.6°F | Resp 17

## 2021-02-24 DIAGNOSIS — C50412 Malignant neoplasm of upper-outer quadrant of left female breast: Secondary | ICD-10-CM | POA: Diagnosis not present

## 2021-02-24 DIAGNOSIS — D519 Vitamin B12 deficiency anemia, unspecified: Secondary | ICD-10-CM

## 2021-02-24 MED ORDER — SODIUM CHLORIDE 0.9 % IV SOLN
400.0000 mg | Freq: Once | INTRAVENOUS | Status: AC
  Administered 2021-02-24: 400 mg via INTRAVENOUS
  Filled 2021-02-24: qty 20

## 2021-02-24 MED ORDER — CYANOCOBALAMIN 1000 MCG/ML IJ SOLN
1000.0000 ug | Freq: Once | INTRAMUSCULAR | Status: AC
Start: 2021-02-24 — End: 2021-02-24
  Administered 2021-02-24: 1000 ug via INTRAMUSCULAR

## 2021-02-24 MED ORDER — LORATADINE 10 MG PO TABS
ORAL_TABLET | ORAL | Status: AC
  Filled 2021-02-24: qty 1

## 2021-02-24 MED ORDER — CYANOCOBALAMIN 1000 MCG/ML IJ SOLN
INTRAMUSCULAR | Status: AC
  Filled 2021-02-24: qty 1

## 2021-02-24 MED ORDER — LORATADINE 10 MG PO TABS
10.0000 mg | ORAL_TABLET | Freq: Once | ORAL | Status: AC
  Administered 2021-02-24: 10 mg via ORAL

## 2021-02-24 MED ORDER — SODIUM CHLORIDE 0.9 % IV SOLN
Freq: Once | INTRAVENOUS | Status: AC
  Filled 2021-02-24: qty 250

## 2021-02-24 NOTE — Patient Instructions (Signed)

## 2021-02-25 ENCOUNTER — Ambulatory Visit
Admission: RE | Admit: 2021-02-25 | Discharge: 2021-02-25 | Disposition: A | Discharge status: Home / Self Care | Payer: BC Managed Care – PPO | Source: Ambulatory Visit | Attending: Radiation Oncology | Admitting: Radiation Oncology

## 2021-02-25 DIAGNOSIS — C50412 Malignant neoplasm of upper-outer quadrant of left female breast: Secondary | ICD-10-CM | POA: Diagnosis not present

## 2021-02-26 ENCOUNTER — Other Ambulatory Visit: Payer: Self-pay

## 2021-02-26 ENCOUNTER — Ambulatory Visit
Admission: RE | Admit: 2021-02-26 | Discharge: 2021-02-26 | Disposition: A | Discharge status: Home / Self Care | Payer: BC Managed Care – PPO | Source: Ambulatory Visit | Attending: Radiation Oncology | Admitting: Radiation Oncology

## 2021-02-26 DIAGNOSIS — C50412 Malignant neoplasm of upper-outer quadrant of left female breast: Secondary | ICD-10-CM | POA: Diagnosis not present

## 2021-02-27 ENCOUNTER — Ambulatory Visit
Admission: RE | Admit: 2021-02-27 | Discharge: 2021-02-27 | Disposition: A | Discharge status: Home / Self Care | Payer: BC Managed Care – PPO | Source: Ambulatory Visit | Attending: Radiation Oncology | Admitting: Radiation Oncology

## 2021-02-27 ENCOUNTER — Telehealth: Payer: Self-pay

## 2021-02-27 DIAGNOSIS — C50412 Malignant neoplasm of upper-outer quadrant of left female breast: Secondary | ICD-10-CM | POA: Diagnosis not present

## 2021-02-27 LAB — RESULTS CONSOLE HPV: CHL HPV: NEGATIVE

## 2021-02-27 LAB — HM MAMMOGRAPHY: HM Mammogram: NORMAL (ref 0–4)

## 2021-02-27 NOTE — Telephone Encounter (Signed)
Dorian Pod from Dr holland's office called. He is wanting to prescribe low dose progesterone birthcontrol pill for Ms Sobieski but is hesitant because of her breast cancer diagnosis.  He is wanting Dr Ernestina Penna recommendations.

## 2021-02-27 NOTE — Telephone Encounter (Signed)
Per Dr Burr Medico, she does not recommend low dose progesterone birth control  for MS Blea.She recommends a no estrogen no progesterone IUD

## 2021-02-28 ENCOUNTER — Ambulatory Visit: Payer: BC Managed Care – PPO

## 2021-03-03 ENCOUNTER — Other Ambulatory Visit: Payer: Self-pay

## 2021-03-03 ENCOUNTER — Inpatient Hospital Stay: Payer: BC Managed Care – PPO

## 2021-03-03 ENCOUNTER — Ambulatory Visit
Admission: RE | Admit: 2021-03-03 | Discharge: 2021-03-03 | Disposition: A | Discharge status: Home / Self Care | Payer: BC Managed Care – PPO | Source: Ambulatory Visit | Attending: Radiation Oncology | Admitting: Radiation Oncology

## 2021-03-03 ENCOUNTER — Other Ambulatory Visit: Payer: Self-pay | Admitting: Hematology

## 2021-03-03 VITALS — BP 128/77 | HR 78 | Temp 98.3°F | Resp 18

## 2021-03-03 DIAGNOSIS — C50412 Malignant neoplasm of upper-outer quadrant of left female breast: Secondary | ICD-10-CM | POA: Diagnosis not present

## 2021-03-03 DIAGNOSIS — D519 Vitamin B12 deficiency anemia, unspecified: Secondary | ICD-10-CM

## 2021-03-03 MED ORDER — CYANOCOBALAMIN 1000 MCG/ML IJ SOLN
1000.0000 ug | Freq: Once | INTRAMUSCULAR | Status: AC
  Administered 2021-03-03: 1000 ug via INTRAMUSCULAR

## 2021-03-03 MED ORDER — LORATADINE 10 MG PO TABS
10.0000 mg | ORAL_TABLET | Freq: Once | ORAL | Status: AC
  Administered 2021-03-03: 10 mg via ORAL

## 2021-03-03 MED ORDER — LORATADINE 10 MG PO TABS
ORAL_TABLET | ORAL | Status: AC
  Filled 2021-03-03: qty 1

## 2021-03-03 MED ORDER — SODIUM CHLORIDE 0.9 % IV SOLN
Freq: Once | INTRAVENOUS | Status: AC
  Filled 2021-03-03: qty 250

## 2021-03-03 MED ORDER — SODIUM CHLORIDE 0.9 % IV SOLN
400.0000 mg | Freq: Once | INTRAVENOUS | Status: AC
  Administered 2021-03-03: 400 mg via INTRAVENOUS
  Filled 2021-03-03: qty 20

## 2021-03-03 MED ORDER — CYANOCOBALAMIN 1000 MCG/ML IJ SOLN
INTRAMUSCULAR | Status: AC
  Filled 2021-03-03: qty 1

## 2021-03-03 NOTE — Patient Instructions (Signed)

## 2021-03-03 NOTE — Progress Notes (Signed)
Patient refused 30 min post iron observation, discharged in stable condition

## 2021-03-04 ENCOUNTER — Ambulatory Visit
Admission: RE | Admit: 2021-03-04 | Discharge: 2021-03-04 | Disposition: A | Discharge status: Home / Self Care | Payer: BC Managed Care – PPO | Source: Ambulatory Visit | Attending: Radiation Oncology | Admitting: Radiation Oncology

## 2021-03-04 DIAGNOSIS — C50412 Malignant neoplasm of upper-outer quadrant of left female breast: Secondary | ICD-10-CM | POA: Diagnosis not present

## 2021-03-05 ENCOUNTER — Ambulatory Visit
Admission: RE | Admit: 2021-03-05 | Discharge: 2021-03-05 | Disposition: A | Discharge status: Home / Self Care | Payer: BC Managed Care – PPO | Source: Ambulatory Visit | Attending: Radiation Oncology | Admitting: Radiation Oncology

## 2021-03-05 ENCOUNTER — Other Ambulatory Visit: Payer: Self-pay

## 2021-03-05 DIAGNOSIS — C50412 Malignant neoplasm of upper-outer quadrant of left female breast: Secondary | ICD-10-CM | POA: Diagnosis not present

## 2021-03-06 ENCOUNTER — Ambulatory Visit
Admission: RE | Admit: 2021-03-06 | Discharge: 2021-03-06 | Disposition: A | Discharge status: Home / Self Care | Payer: BC Managed Care – PPO | Source: Ambulatory Visit | Attending: Radiation Oncology | Admitting: Radiation Oncology

## 2021-03-06 ENCOUNTER — Encounter: Payer: Self-pay | Admitting: Internal Medicine

## 2021-03-06 ENCOUNTER — Other Ambulatory Visit: Payer: Self-pay | Admitting: Internal Medicine

## 2021-03-06 DIAGNOSIS — C50412 Malignant neoplasm of upper-outer quadrant of left female breast: Secondary | ICD-10-CM | POA: Diagnosis not present

## 2021-03-06 NOTE — Telephone Encounter (Signed)
Last office visit 05/20/2020 for Anemia with R. Baity.  Last refilled Alprazolam: 12/17/2020 for #20 with no refills.  Gabapentin: 12/17/2020 for #270 with no refills.  No TOC appointments.

## 2021-03-07 ENCOUNTER — Inpatient Hospital Stay: Payer: BC Managed Care – PPO | Attending: Nurse Practitioner

## 2021-03-07 ENCOUNTER — Ambulatory Visit
Admission: RE | Admit: 2021-03-07 | Discharge: 2021-03-07 | Disposition: A | Discharge status: Home / Self Care | Payer: BC Managed Care – PPO | Source: Ambulatory Visit | Attending: Radiation Oncology | Admitting: Radiation Oncology

## 2021-03-07 ENCOUNTER — Other Ambulatory Visit: Payer: Self-pay

## 2021-03-07 DIAGNOSIS — Z170 Estrogen receptor positive status [ER+]: Secondary | ICD-10-CM | POA: Diagnosis present

## 2021-03-07 DIAGNOSIS — E538 Deficiency of other specified B group vitamins: Secondary | ICD-10-CM | POA: Diagnosis present

## 2021-03-07 DIAGNOSIS — C50412 Malignant neoplasm of upper-outer quadrant of left female breast: Secondary | ICD-10-CM | POA: Insufficient documentation

## 2021-03-07 DIAGNOSIS — D519 Vitamin B12 deficiency anemia, unspecified: Secondary | ICD-10-CM

## 2021-03-07 MED ORDER — CYANOCOBALAMIN 1000 MCG/ML IJ SOLN
INTRAMUSCULAR | Status: AC
  Filled 2021-03-07: qty 1

## 2021-03-07 MED ORDER — CYANOCOBALAMIN 1000 MCG/ML IJ SOLN
1000.0000 ug | Freq: Once | INTRAMUSCULAR | Status: AC
  Administered 2021-03-07: 1000 ug via INTRAMUSCULAR

## 2021-03-07 NOTE — Patient Instructions (Signed)
Vitamin B12 Injection What is this medication? Vitamin B12 (VAHY tuh min B12) prevents and treats low vitamin B12 levels in your body. It is used in people who do not get enough vitamin B12 from their diet or when their digestive tract does not absorb enough. Vitamin B12 plays an important role in maintaining the health of your nervous system and red bloodcells. This medicine may be used for other purposes; ask your health care provider orpharmacist if you have questions. COMMON BRAND NAME(S): B-12 Compliance Kit, B-12 Injection Kit, Cyomin, LA-12,Nutri-Twelve, Physicians EZ Use B-12, Primabalt What should I tell my care team before I take this medication? They need to know if you have any of these conditions: Kidney disease Leber's disease Megaloblastic anemia An unusual or allergic reaction to cyanocobalamin, cobalt, other medications, foods, dyes, or preservatives Pregnant or trying to get pregnant Breast-feeding How should I use this medication? This medication is injected into a muscle or deeply under the skin. It is usually given in a clinic or care team's office. However, your care team mayteach you how to inject yourself. Follow all instructions. Talk to your care team about the use of this medication in children. Specialcare may be needed. Overdosage: If you think you have taken too much of this medicine contact apoison control center or emergency room at once. NOTE: This medicine is only for you. Do not share this medicine with others. What if I miss a dose? If you are given your dose at a clinic or care team's office, call to reschedule your appointment. If you give your own injections, and you miss a dose, take it as soon as you can. If it is almost time for your next dose, takeonly that dose. Do not take double or extra doses. What may interact with this medication? Colchicine Heavy alcohol intake This list may not describe all possible interactions. Give your health care provider  a list of all the medicines, herbs, non-prescription drugs, or dietary supplements you use. Also tell them if you smoke, drink alcohol, or use illegaldrugs. Some items may interact with your medicine. What should I watch for while using this medication? Visit your care team regularly. You may need blood work done while you aretaking this medication. You may need to follow a special diet. Talk to your care team. Limit youralcohol intake and avoid smoking to get the best benefit. What side effects may I notice from receiving this medication? Side effects that you should report to your care team as soon as possible: Allergic reactions-skin rash, itching, hives, swelling of the face, lips, tongue, or throat Swelling of the ankles, hands, or feet Trouble breathing Side effects that usually do not require medical attention (report to your careteam if they continue or are bothersome): Diarrhea This list may not describe all possible side effects. Call your doctor for medical advice about side effects. You may report side effects to FDA at1-800-FDA-1088. Where should I keep my medication? Keep out of the reach of children. Store at room temperature between 15 and 30 degrees C (59 and 85 degrees F).Protect from light. Throw away any unused medication after the expiration date. NOTE: This sheet is a summary. It may not cover all possible information. If you have questions about this medicine, talk to your doctor, pharmacist, orhealth care provider.  2022 Elsevier/Gold Standard (2020-10-14 11:47:06)  

## 2021-03-08 ENCOUNTER — Other Ambulatory Visit: Payer: Self-pay | Admitting: Internal Medicine

## 2021-03-09 MED ORDER — GABAPENTIN 100 MG PO CAPS: 100.0000 mg | ORAL_CAPSULE | Freq: Three times a day (TID) | ORAL | 0 refills | Status: DC

## 2021-03-09 MED ORDER — ALPRAZOLAM 0.25 MG PO TABS: 0.2500 mg | ORAL_TABLET | Freq: Two times a day (BID) | ORAL | 0 refills | Status: DC | PRN

## 2021-03-11 ENCOUNTER — Other Ambulatory Visit: Payer: Self-pay

## 2021-03-11 ENCOUNTER — Ambulatory Visit
Admission: RE | Admit: 2021-03-11 | Discharge: 2021-03-11 | Disposition: A | Discharge status: Home / Self Care | Payer: BC Managed Care – PPO | Source: Ambulatory Visit | Attending: Radiation Oncology | Admitting: Radiation Oncology

## 2021-03-11 DIAGNOSIS — C50412 Malignant neoplasm of upper-outer quadrant of left female breast: Secondary | ICD-10-CM | POA: Diagnosis not present

## 2021-03-12 ENCOUNTER — Ambulatory Visit
Admission: RE | Admit: 2021-03-12 | Discharge: 2021-03-12 | Disposition: A | Discharge status: Home / Self Care | Payer: BC Managed Care – PPO | Source: Ambulatory Visit | Attending: Radiation Oncology | Admitting: Radiation Oncology

## 2021-03-12 DIAGNOSIS — C50412 Malignant neoplasm of upper-outer quadrant of left female breast: Secondary | ICD-10-CM | POA: Diagnosis not present

## 2021-03-13 ENCOUNTER — Other Ambulatory Visit: Payer: Self-pay

## 2021-03-13 ENCOUNTER — Ambulatory Visit
Admission: RE | Admit: 2021-03-13 | Discharge: 2021-03-13 | Disposition: A | Discharge status: Home / Self Care | Payer: BC Managed Care – PPO | Source: Ambulatory Visit | Attending: Radiation Oncology | Admitting: Radiation Oncology

## 2021-03-13 DIAGNOSIS — C50412 Malignant neoplasm of upper-outer quadrant of left female breast: Secondary | ICD-10-CM | POA: Diagnosis not present

## 2021-03-14 ENCOUNTER — Inpatient Hospital Stay: Payer: BC Managed Care – PPO

## 2021-03-14 ENCOUNTER — Ambulatory Visit
Admission: RE | Admit: 2021-03-14 | Discharge: 2021-03-14 | Disposition: A | Discharge status: Home / Self Care | Payer: BC Managed Care – PPO | Source: Ambulatory Visit | Attending: Radiation Oncology | Admitting: Radiation Oncology

## 2021-03-14 ENCOUNTER — Ambulatory Visit: Payer: BC Managed Care – PPO | Admitting: Radiation Oncology

## 2021-03-14 ENCOUNTER — Encounter: Payer: Self-pay | Admitting: Radiation Oncology

## 2021-03-14 DIAGNOSIS — C50412 Malignant neoplasm of upper-outer quadrant of left female breast: Secondary | ICD-10-CM | POA: Diagnosis not present

## 2021-03-14 DIAGNOSIS — D519 Vitamin B12 deficiency anemia, unspecified: Secondary | ICD-10-CM

## 2021-03-14 DIAGNOSIS — E538 Deficiency of other specified B group vitamins: Secondary | ICD-10-CM | POA: Diagnosis not present

## 2021-03-14 MED ORDER — CYANOCOBALAMIN 1000 MCG/ML IJ SOLN
INTRAMUSCULAR | Status: AC
  Filled 2021-03-14: qty 1

## 2021-03-14 MED ORDER — CYANOCOBALAMIN 1000 MCG/ML IJ SOLN
1000.0000 ug | Freq: Once | INTRAMUSCULAR | Status: AC
  Administered 2021-03-14: 1000 ug via INTRAMUSCULAR

## 2021-03-14 NOTE — Patient Instructions (Signed)
Vitamin B12 Injection What is this medication? Vitamin B12 (VAHY tuh min B12) prevents and treats low vitamin B12 levels in your body. It is used in people who do not get enough vitamin B12 from their diet or when their digestive tract does not absorb enough. Vitamin B12 plays an important role in maintaining the health of your nervous system and red bloodcells. This medicine may be used for other purposes; ask your health care provider orpharmacist if you have questions. COMMON BRAND NAME(S): B-12 Compliance Kit, B-12 Injection Kit, Cyomin, LA-12,Nutri-Twelve, Physicians EZ Use B-12, Primabalt What should I tell my care team before I take this medication? They need to know if you have any of these conditions: Kidney disease Leber's disease Megaloblastic anemia An unusual or allergic reaction to cyanocobalamin, cobalt, other medications, foods, dyes, or preservatives Pregnant or trying to get pregnant Breast-feeding How should I use this medication? This medication is injected into a muscle or deeply under the skin. It is usually given in a clinic or care team's office. However, your care team mayteach you how to inject yourself. Follow all instructions. Talk to your care team about the use of this medication in children. Specialcare may be needed. Overdosage: If you think you have taken too much of this medicine contact apoison control center or emergency room at once. NOTE: This medicine is only for you. Do not share this medicine with others. What if I miss a dose? If you are given your dose at a clinic or care team's office, call to reschedule your appointment. If you give your own injections, and you miss a dose, take it as soon as you can. If it is almost time for your next dose, takeonly that dose. Do not take double or extra doses. What may interact with this medication? Colchicine Heavy alcohol intake This list may not describe all possible interactions. Give your health care provider  a list of all the medicines, herbs, non-prescription drugs, or dietary supplements you use. Also tell them if you smoke, drink alcohol, or use illegaldrugs. Some items may interact with your medicine. What should I watch for while using this medication? Visit your care team regularly. You may need blood work done while you aretaking this medication. You may need to follow a special diet. Talk to your care team. Limit youralcohol intake and avoid smoking to get the best benefit. What side effects may I notice from receiving this medication? Side effects that you should report to your care team as soon as possible: Allergic reactions-skin rash, itching, hives, swelling of the face, lips, tongue, or throat Swelling of the ankles, hands, or feet Trouble breathing Side effects that usually do not require medical attention (report to your careteam if they continue or are bothersome): Diarrhea This list may not describe all possible side effects. Call your doctor for medical advice about side effects. You may report side effects to FDA at1-800-FDA-1088. Where should I keep my medication? Keep out of the reach of children. Store at room temperature between 15 and 30 degrees C (59 and 85 degrees F).Protect from light. Throw away any unused medication after the expiration date. NOTE: This sheet is a summary. It may not cover all possible information. If you have questions about this medicine, talk to your doctor, pharmacist, orhealth care provider.  2022 Elsevier/Gold Standard (2020-10-14 11:47:06)  

## 2021-03-16 ENCOUNTER — Encounter: Payer: Self-pay | Admitting: Hematology

## 2021-03-17 ENCOUNTER — Ambulatory Visit
Admission: RE | Admit: 2021-03-17 | Discharge: 2021-03-17 | Disposition: A | Discharge status: Home / Self Care | Payer: BC Managed Care – PPO | Source: Ambulatory Visit | Attending: Radiation Oncology | Admitting: Radiation Oncology

## 2021-03-17 ENCOUNTER — Other Ambulatory Visit: Payer: Self-pay

## 2021-03-17 DIAGNOSIS — C50412 Malignant neoplasm of upper-outer quadrant of left female breast: Secondary | ICD-10-CM | POA: Diagnosis not present

## 2021-03-18 ENCOUNTER — Ambulatory Visit: Payer: BC Managed Care – PPO

## 2021-03-18 ENCOUNTER — Ambulatory Visit
Admission: RE | Admit: 2021-03-18 | Discharge: 2021-03-18 | Disposition: A | Discharge status: Home / Self Care | Payer: BC Managed Care – PPO | Source: Ambulatory Visit | Attending: Radiation Oncology | Admitting: Radiation Oncology

## 2021-03-18 DIAGNOSIS — C50412 Malignant neoplasm of upper-outer quadrant of left female breast: Secondary | ICD-10-CM | POA: Diagnosis not present

## 2021-03-19 ENCOUNTER — Ambulatory Visit: Payer: BC Managed Care – PPO

## 2021-03-20 ENCOUNTER — Ambulatory Visit: Payer: BC Managed Care – PPO

## 2021-03-20 ENCOUNTER — Ambulatory Visit
Admission: RE | Admit: 2021-03-20 | Discharge: 2021-03-20 | Disposition: A | Discharge status: Home / Self Care | Payer: BC Managed Care – PPO | Source: Ambulatory Visit | Attending: Radiation Oncology | Admitting: Radiation Oncology

## 2021-03-20 DIAGNOSIS — C50412 Malignant neoplasm of upper-outer quadrant of left female breast: Secondary | ICD-10-CM | POA: Diagnosis not present

## 2021-03-21 ENCOUNTER — Other Ambulatory Visit: Payer: Self-pay

## 2021-03-21 ENCOUNTER — Ambulatory Visit
Admission: RE | Admit: 2021-03-21 | Discharge: 2021-03-21 | Disposition: A | Discharge status: Home / Self Care | Payer: BC Managed Care – PPO | Source: Ambulatory Visit | Attending: Radiation Oncology | Admitting: Radiation Oncology

## 2021-03-21 ENCOUNTER — Inpatient Hospital Stay: Payer: BC Managed Care – PPO

## 2021-03-21 DIAGNOSIS — E538 Deficiency of other specified B group vitamins: Secondary | ICD-10-CM | POA: Diagnosis not present

## 2021-03-21 DIAGNOSIS — D519 Vitamin B12 deficiency anemia, unspecified: Secondary | ICD-10-CM

## 2021-03-21 DIAGNOSIS — C50412 Malignant neoplasm of upper-outer quadrant of left female breast: Secondary | ICD-10-CM | POA: Diagnosis not present

## 2021-03-21 MED ORDER — CYANOCOBALAMIN 1000 MCG/ML IJ SOLN
INTRAMUSCULAR | Status: AC
  Filled 2021-03-21: qty 1

## 2021-03-21 MED ORDER — CYANOCOBALAMIN 1000 MCG/ML IJ SOLN
1000.0000 ug | Freq: Once | INTRAMUSCULAR | Status: AC
Start: 2021-03-21 — End: 2021-03-21
  Administered 2021-03-21: 1000 ug via INTRAMUSCULAR

## 2021-03-21 NOTE — Patient Instructions (Signed)
Cyanocobalamin, Vitamin B12 injection O que  este medicamento? A CIANOCOBALAMINA  uma forma sinttica de vitamina B12. A vitamina B12  essencial para o desenvolvimento de glbulos sanguneos, clulas nervosas e protenas saudveis pelo organismo. Tambm ajuda no metabolismo das gorduras e carboidratos. Este medicamento  usado para tratar pessoas que no conseguem absorver vitamina B12 suficiente. Este medicamento pode ser usado para outros propsitos; em caso de dvidas, pergunte ao seu profissional de sade ou farmacutico. NOMES DE MARCAS COMUNS: B-12 Compliance Kit, B-12 Injection Kit, Cyomin, LA-12, Nutri-Twelve, Physicians EZ Use B-12, Primabalt O que devo dizer a meu profissional de sade antes de tomar este medicamento? Precisam saber se voc tem algum dos seguintes problemas ou estados de sade: doenas renais sndrome ou doena de Leber anemia megaloblstica reao estranha ou alergia  cianocobalamina ou ao cobalto reao estranha ou alergia a outros medicamentos, alimentos, corantes ou conservantes est grvida ou tentando engravidar est amamentando Como devo usar este medicamento? Este medicamento  injetado por via intramuscular ou por injeo subcutnea profunda. Costuma ser administrado por um profissional de sade em consultrio ou Chief of Staff. Porm,  possvel que seu mdico lhe ensine como aplicar suas prprias injees. Siga todas as instrues. Fale com seu pediatra a respeito do uso deste medicamento em crianas. Pode ser preciso tomar alguns cuidados especiais. Superdosagem: Se achar que tomou uma superdosagem deste medicamento, entre em contato imediatamente com o [facility] ou v a [employer]. OBSERVAO: Este medicamento  s para voc. No compartilhe este medicamento com outras pessoas. E se eu deixar de tomar uma dose? Se toma o medicamento em uma clnica ou no consultrio do seu mdico, ligue para Paramedic a Passenger transport manager. Se aplica as  injees por conta prpria e perdeu uma dose, tome-a assim que possvel. Se j estiver quase na hora da sua prxima dose, tome somente essa dose. No tome o remdio em dobro, nem tome uma dose adicional. O que pode interagir com este medicamento? colchicina consumo pesado de lcool Esta lista pode no descrever todas as interaes possveis. D ao seu profissional de sade uma lista de todos os medicamentos, ervas medicinais, remdios de venda livre, ou suplementos alimentares que voc [place]. Diga tambm se voc fuma, bebe, ou [place] drogas ilcitas. Alguns destes podem interagir com o seu medicamento. Ao que devo ficar atento quando estiver [employer] medicamento? Consulte seu mdico ou profissional de sade para acompanhamento regular Museum/gallery curator. Voc precisar fazer exames de sangue peridicos enquanto estiver [employer]. Voc pode precisar seguir uma dieta especial. Fale com seu mdico. Para conseguir o mximo benefcio deste medicamento, limite o seu consumo de lcool e evite fumar. Que efeitos colaterais posso sentir aps usar este medicamento? Efeitos colaterais que devem ser informados ao seu mdico ou profissional de sade o mais rpido possvel: reaes alrgicas, como erupo na pele, coceira, urticria, ou inchao do rosto, dos lbios ou da lngua pele azulada dor ou aperto no peito chiado no peito ou dificuldade para respirar tontura rea vermelha, inchada e dolorosa na perna Efeitos colaterais que normalmente no precisam de cuidados mdicos (avise ao seu mdico ou profissional de sade se persistirem ou forem incmodos): diarreia dor de cabea Esta lista pode no descrever todos os efeitos colaterais possveis. Para mais orientaes sobre efeitos colaterais, consulte o seu mdico. Voc pode relatar a ocorrncia de efeitos colaterais  FDA pelo telefone [phone]. Onde devo guardar meu medicamento? [facility] fora do [employer]. Conservar em [employer], entre 15 e 30 degreesC (  59 e 86 degreesF). Proteger Administrator, arts. Descartar qualquer medicamento no utilizado aps a data de validade impressa no rtulo ou embalagem. OBSERVAO: Este folheto  um resumo. Pode no cobrir todas as informaes possveis. Se tiver dvidas a respeito deste medicamento, fale com seu mdico, farmacutico ou profissional de sade.  2021 Elsevier/Gold Standard (2010-05-28 00:00:00)

## 2021-03-24 ENCOUNTER — Ambulatory Visit
Admission: RE | Admit: 2021-03-24 | Discharge: 2021-03-24 | Disposition: A | Discharge status: Home / Self Care | Payer: BC Managed Care – PPO | Source: Ambulatory Visit | Attending: Radiation Oncology | Admitting: Radiation Oncology

## 2021-03-24 ENCOUNTER — Ambulatory Visit: Payer: BC Managed Care – PPO

## 2021-03-24 ENCOUNTER — Other Ambulatory Visit: Payer: Self-pay | Admitting: Radiation Oncology

## 2021-03-24 ENCOUNTER — Encounter: Payer: Self-pay | Admitting: Radiation Oncology

## 2021-03-24 ENCOUNTER — Other Ambulatory Visit: Payer: Self-pay

## 2021-03-24 ENCOUNTER — Encounter: Payer: Self-pay | Admitting: *Deleted

## 2021-03-24 DIAGNOSIS — C50412 Malignant neoplasm of upper-outer quadrant of left female breast: Secondary | ICD-10-CM | POA: Diagnosis not present

## 2021-03-24 DIAGNOSIS — Z170 Estrogen receptor positive status [ER+]: Secondary | ICD-10-CM

## 2021-03-24 MED ORDER — SILVER SULFADIAZINE 1 % EX CREA: TOPICAL_CREAM | Freq: Two times a day (BID) | CUTANEOUS | Status: DC

## 2021-03-24 MED ORDER — OXYCODONE HCL 5 MG PO TABS: 5.0000 mg | ORAL_TABLET | Freq: Four times a day (QID) | ORAL | 0 refills | Status: DC | PRN

## 2021-03-24 NOTE — Progress Notes (Signed)
Pt seen during boost for moist desquamation along the inframammary fold. No allergies to sulfa, will use Silvadene. Pt request for refill of pain medication as well.

## 2021-03-25 ENCOUNTER — Ambulatory Visit
Admission: RE | Admit: 2021-03-25 | Discharge: 2021-03-25 | Disposition: A | Discharge status: Home / Self Care | Payer: BC Managed Care – PPO | Source: Ambulatory Visit | Attending: Radiation Oncology | Admitting: Radiation Oncology

## 2021-03-25 ENCOUNTER — Ambulatory Visit: Payer: BC Managed Care – PPO

## 2021-03-25 DIAGNOSIS — Z170 Estrogen receptor positive status [ER+]: Secondary | ICD-10-CM

## 2021-03-25 DIAGNOSIS — C50412 Malignant neoplasm of upper-outer quadrant of left female breast: Secondary | ICD-10-CM | POA: Diagnosis not present

## 2021-03-25 MED ORDER — SILVER SULFADIAZINE 1 % EX CREA: TOPICAL_CREAM | Freq: Every day | CUTANEOUS | Status: DC

## 2021-03-26 ENCOUNTER — Ambulatory Visit
Admission: RE | Admit: 2021-03-26 | Discharge: 2021-03-26 | Disposition: A | Discharge status: Home / Self Care | Payer: BC Managed Care – PPO | Source: Ambulatory Visit | Attending: Radiation Oncology | Admitting: Radiation Oncology

## 2021-03-26 ENCOUNTER — Encounter: Payer: Self-pay | Admitting: Radiation Oncology

## 2021-03-26 ENCOUNTER — Other Ambulatory Visit: Payer: Self-pay

## 2021-03-26 DIAGNOSIS — C50412 Malignant neoplasm of upper-outer quadrant of left female breast: Secondary | ICD-10-CM | POA: Diagnosis not present

## 2021-03-27 ENCOUNTER — Ambulatory Visit: Payer: BC Managed Care – PPO | Admitting: Internal Medicine

## 2021-03-27 ENCOUNTER — Encounter: Payer: Self-pay | Admitting: Internal Medicine

## 2021-03-27 ENCOUNTER — Other Ambulatory Visit: Payer: Self-pay

## 2021-03-27 ENCOUNTER — Encounter: Payer: Self-pay | Admitting: Hematology

## 2021-03-27 ENCOUNTER — Other Ambulatory Visit: Payer: BC Managed Care – PPO

## 2021-03-27 VITALS — BP 119/77 | HR 88 | Temp 97.8°F | Resp 18 | Ht 65.0 in | Wt 219.6 lb

## 2021-03-27 DIAGNOSIS — J0110 Acute frontal sinusitis, unspecified: Secondary | ICD-10-CM | POA: Diagnosis not present

## 2021-03-27 DIAGNOSIS — Z114 Encounter for screening for human immunodeficiency virus [HIV]: Secondary | ICD-10-CM

## 2021-03-27 DIAGNOSIS — Z6836 Body mass index (BMI) 36.0-36.9, adult: Secondary | ICD-10-CM

## 2021-03-27 DIAGNOSIS — Z1159 Encounter for screening for other viral diseases: Secondary | ICD-10-CM

## 2021-03-27 DIAGNOSIS — E66812 Obesity, class 2: Secondary | ICD-10-CM | POA: Insufficient documentation

## 2021-03-27 DIAGNOSIS — E6609 Other obesity due to excess calories: Secondary | ICD-10-CM | POA: Insufficient documentation

## 2021-03-27 DIAGNOSIS — E7800 Pure hypercholesterolemia, unspecified: Secondary | ICD-10-CM

## 2021-03-27 DIAGNOSIS — R7303 Prediabetes: Secondary | ICD-10-CM | POA: Insufficient documentation

## 2021-03-27 DIAGNOSIS — E039 Hypothyroidism, unspecified: Secondary | ICD-10-CM

## 2021-03-27 DIAGNOSIS — L089 Local infection of the skin and subcutaneous tissue, unspecified: Secondary | ICD-10-CM

## 2021-03-27 DIAGNOSIS — S0183XA Puncture wound without foreign body of other part of head, initial encounter: Secondary | ICD-10-CM | POA: Diagnosis not present

## 2021-03-27 MED ORDER — AMOXICILLIN-POT CLAVULANATE 875-125 MG PO TABS: 1.0000 | ORAL_TABLET | Freq: Two times a day (BID) | ORAL | 0 refills | Status: DC

## 2021-03-27 NOTE — Progress Notes (Signed)
Subjective:    Patient ID: [name], female    DOB: [birth year], 50 y.o.   MRN: [id number]  HPI  Patient presents the clinic today with complaint of facial pressure, nasal congestion, ear pain, postnasal drip and cough.  She reports this started a week and a half ago.  The pressure is located mostly in her forehead.  She denies dizziness or visual changes.  She is not blowing anything out of her nose.  She denies ear drainage or loss of hearing.  She denies difficulty swallowing.  The cough is productive of yellow mucus.  She denies fever, chills or body aches.  She has tried Benadryl, Sudafed and putting olive oil mixed with tea tree oil in her areas with minimal relief of symptoms.  She has not had sick contacts that she is aware of.  She has had multiple negative COVID home COVID test.  She also reports nasal trauma.  She reports about 1 week ago her dog hit her in the nose.  She does have a septum piercing which has been bleeding, bruised and swollen since that time.  She has not tried anything OTC for this.  Review of Systems  Past Medical History:  Diagnosis Date   Allergy    Anemia    present for many years, mild, stable   Anxiety    Breast cancer ([place])    Breast cancer ([place]) 12/18/2020   Dysrhythmia    PAC's on flecanide (did not tolerate beta blockers)   Family history of breast cancer 01/20/2021   Family history of prostate cancer 01/20/2021   Frequent headaches    HSV infection    fever blisters   Hypertension    Hypothyroidism     Current Outpatient Medications  Medication Sig Dispense Refill   ALPRAZolam (XANAX) 0.25 MG tablet Take 1 tablet (0.25 mg total) by mouth 2 (two) times daily as needed. 20 tablet 0   AMETHIA 0.15-0.03 &0.01 MG tablet Take 1 tablet by mouth daily. (Patient taking differently: Take 1 tablet by mouth at bedtime.) 1 Package 0   butalbital-acetaminophen-caffeine (FIORICET) 50-325-40 MG tablet TAKE 1 TABLET BY MOUTH EVERY 6 (SIX) HOURS AS  NEEDED FOR HEADACHE. 20 tablet 2   cyclobenzaprine (FLEXERIL) 10 MG tablet TAKE 1 TABLET (10 MG TOTAL) BY MOUTH DAILY AS NEEDED (MIGRAINES). 15 tablet 0   flecainide (TAMBOCOR) 50 MG tablet TAKE 1 TABLET BY MOUTH TWICE A DAY (Patient taking differently: Take 50 mg by mouth 2 (two) times daily.) 180 tablet 1   fluticasone (FLONASE) 50 MCG/ACT nasal spray SPRAY 2 SPRAYS INTO EACH NOSTRIL EVERY DAY (Patient taking differently: Place 2 sprays into both nostrils daily as needed for allergies.) 48 mL 0   gabapentin (NEURONTIN) 100 MG capsule Take 1 capsule (100 mg total) by mouth 3 (three) times daily. 270 capsule 0   ibuprofen (ADVIL) 800 MG tablet Take 1 tablet (800 mg total) by mouth every 8 (eight) hours as needed. (Patient taking differently: Take 800 mg by mouth every 8 (eight) hours as needed (pain).) 30 tablet 0   ibuprofen (ADVIL) 800 MG tablet Take 1 tablet (800 mg total) by mouth every 8 (eight) hours as needed. 30 tablet 0   levocetirizine (XYZAL) 5 MG tablet TAKE 1 TABLET BY MOUTH EVERY DAY IN THE EVENING 90 tablet 0   levothyroxine (SYNTHROID) 112 MCG tablet TAKE 1 TABLET (112 MCG TOTAL) BY MOUTH DAILY BEFORE BREAKFAST. (Patient taking differently: Take 112 mcg by mouth at bedtime.) 90 tablet  0   losartan (COZAAR) 25 MG tablet TAKE 1 TABLET BY MOUTH EVERY DAY (Patient taking differently: Take 25 mg by mouth at bedtime.) 90 tablet 3   oxyCODONE (OXY IR/ROXICODONE) 5 MG immediate release tablet Take 1 tablet (5 mg total) by mouth every 6 (six) hours as needed for severe pain. 30 tablet 0   pravastatin (PRAVACHOL) 40 MG tablet TAKE 1 TABLET BY MOUTH EVERY DAY 90 tablet 0   Prenatal Vit-Fe Fumarate-FA (PRENATAL MULTIVITAMIN) TABS tablet Take 1 tablet by mouth at bedtime.     rizatriptan (MAXALT-MLT) 10 MG disintegrating tablet Take 1 tablet (10 mg total) by mouth as needed for migraine. May repeat in 2 hours if needed 10 tablet 1   SUMAtriptan (IMITREX) 25 MG tablet TAKE 1 TABLET (25 MG TOTAL) BY  MOUTH EVERY 2 (TWO) HOURS AS NEEDED FOR MIGRAINE. MAY REPEAT IN 2 HOURS IF HEADACHE PERSISTS OR RECURS. 10 tablet 0   tamoxifen (NOLVADEX) 20 MG tablet Take 1 tablet (20 mg total) by mouth daily. 30 tablet 3   valACYclovir (VALTREX) 500 MG tablet Take 1 tablet (500 mg total) by mouth daily as needed (herpes simplex). 30 tablet 3   venlafaxine XR (EFFEXOR-XR) 37.5 MG 24 hr capsule Take 1 capsule (37.5 mg total) by mouth daily with breakfast. 30 capsule 1   No current facility-administered medications for this visit.    Allergies  Allergen Reactions   Ciprofloxacin     anxiety   Keflex [Cephalexin] Hives   Trazodone And Nefazodone Other (See Comments)    nightmares    Family History  Problem Relation Age of Onset   Depression Mother    Breast cancer Mother 59   Hyperlipidemia Father    Prostate cancer Father 1    Social History   Socioeconomic History   Marital status: Single    Spouse name: Not on file   Number of children: 1   Years of education: Not on file   Highest education level: Not on file  Occupational History   Not on file  Tobacco Use   Smoking status: Never   Smokeless tobacco: Never  Vaping Use   Vaping Use: Never used  Substance and Sexual Activity   Alcohol use: Yes    Comment: occasional   Drug use: No   Sexual activity: Yes    Partners: Male    Birth control/protection: Pill  Other Topics Concern   Not on file  Social History Narrative   Not on file   Social Determinants of Health   Financial Resource Strain: Not on file  Food Insecurity: Not on file  Transportation Needs: Not on file  Physical Activity: Not on file  Stress: Not on file  Social Connections: Not on file  Intimate Partner Violence: Not At Risk   Fear of Current or Ex-Partner: No   Emotionally Abused: No   Physically Abused: No   Sexually Abused: No     Constitutional: Denies fever, malaise, fatigue, headache or abrupt weight changes.  HEENT: Patient reports facial  pressure, ear fullness, nasal congestion and postnasal drip.  Denies eye pain, eye redness, ear pain, ringing in the ears, wax buildup, runny nose, bloody nose, or sore throat. Respiratory: Patient reports cough.  Denies difficulty breathing, shortness of breath.   Cardiovascular: Denies chest pain, chest tightness, palpitations or swelling in the hands or feet.  Skin: Patient reports swelling and bruising around septal piercing.  Denies redness, rashes, lesions or ulcercations.  Neurological: Denies dizziness, difficulty with  memory, difficulty with speech or problems with balance and coordination.  P No other specific complaints in a complete review of systems (except as listed in HPI above).     Objective:   Physical Exam  BP 119/77 (BP Location: Right Arm, Patient Position: Sitting, Cuff Size: Large)   Pulse 88   Temp 97.8 F (36.6 C) (Temporal)   Resp 18   Ht 5\' 5"  (1.651 m)   Wt 219 lb 9.6 oz (99.6 kg)   SpO2 99%   BMI 36.54 kg/m   Wt Readings from Last 3 Encounters:  02/21/21 220 lb (99.8 kg)  01/17/21 221 lb 3.2 oz (100.3 kg)  01/07/21 220 lb (99.8 kg)    General: Appears her stated age, obese in NAD. Skin: Warm, dry and intact.  Bruising and swelling noted at the tip of the nose. HEENT: Head: normal shape and size, frontal sinus tenderness noted.; Eyes: sclera white and EOMs intact; Ears: Tm's gray and intact, normal light reflex;  Teeth present, mucosa pink and moist, + PND no exudate, lesions or ulcerations noted.  Neck: No adenopathy noted. Cardiovascular: Normal rate and rhythm. S1,S2 noted.  No murmur, rubs or gallops noted.  Pulmonary/Chest: Normal effort and positive vesicular breath sounds. No respiratory distress. No wheezes, rales or ronchi noted.  Neurological: Alert and oriented.    BMET    Component Value Date/Time   NA 137 02/21/2021 1130   NA 143 01/26/2020 1600   K 3.7 02/21/2021 1130   CL 104 02/21/2021 1130   CO2 26 02/21/2021 1130   GLUCOSE 99  02/21/2021 1130   BUN 12 02/21/2021 1130   BUN 11 01/26/2020 1600   CREATININE 1.14 (H) 02/21/2021 1130   CALCIUM 9.1 02/21/2021 1130   GFRNONAA 59 (L) 02/21/2021 1130   GFRAA >60 02/25/2020 2231    Lipid Panel     Component Value Date/Time   CHOL 184 12/21/2019 1204   TRIG 94.0 12/21/2019 1204   HDL 38.00 (L) 12/21/2019 1204   CHOLHDL 5 12/21/2019 1204   VLDL 18.8 12/21/2019 1204   LDLCALC 127 (H) 12/21/2019 1204    CBC    Component Value Date/Time   WBC 3.8 (L) 02/21/2021 1130   WBC 6.0 01/07/2021 0733   RBC 4.38 02/21/2021 1130   HGB 11.0 (L) 02/21/2021 1130   HGB 10.1 (L) 04/25/2020 1154   HCT 33.8 (L) 02/21/2021 1130   HCT 31.6 (L) 04/25/2020 1154   PLT 228 02/21/2021 1130   PLT 261 04/25/2020 1154   MCV 77.2 (L) 02/21/2021 1130   MCV 80 04/25/2020 1154   MCH 25.1 (L) 02/21/2021 1130   MCHC 32.5 02/21/2021 1130   RDW 16.1 (H) 02/21/2021 1130   RDW 16.8 (H) 04/25/2020 1154   LYMPHSABS 0.9 02/21/2021 1130   LYMPHSABS 1.4 04/25/2020 1154   MONOABS 0.4 02/21/2021 1130   EOSABS 0.1 02/21/2021 1130   EOSABS 0.0 04/25/2020 1154   BASOSABS 0.1 02/21/2021 1130   BASOSABS 0.1 04/25/2020 1154    Hgb A1C Lab Results  Component Value Date   HGBA1C 5.7 12/21/2019           Assessment & Plan:   Acute Frontal Sinusitis:  Can use Zyrtec and Flonase OTC as directed on the package Rx for Augmentin 875-125 mg p.o. twice daily x10 days  Infected Nasal Piercing secondary to Trauma:  Rx for Augmentin 875-125 mg p.o. twice daily x10 days  Return precautions discussed  Webb Silversmith, NP This visit occurred during the  SARS-CoV-2 public health emergency.  Safety protocols were in place, including screening questions prior to the visit, additional usage of staff PPE, and extensive cleaning of exam room while observing appropriate contact time as indicated for disinfecting solutions.

## 2021-03-27 NOTE — Assessment & Plan Note (Signed)
Encouraged diet and exercise for weight loss ?

## 2021-03-27 NOTE — Patient Instructions (Signed)

## 2021-03-27 NOTE — Progress Notes (Signed)
  Radiation Oncology         [phone] ________________________________  Name: [name] MRN: [id number]  Date: 03/14/2021  DOB: 1970/10/31  SIMULATION NOTE   NARRATIVE:  The patient underwent simulation today for ongoing radiation therapy.  The existing CT study set was employed for the purpose of virtual treatment planning.  The target and avoidance structures were reviewed and modified as necessary.  Treatment planning then occurred.  The radiation boost prescription was entered and confirmed.  A total of 3 complex treatment devices were fabricated in the form of multi-leaf collimators to shape radiation around the targets while maximally excluding nearby normal structures. I have requested : Isodose Plan.    PLAN:  This modified radiation beam arrangement is intended to continue the current radiation dose to an additional 10 Gy in 5 fractions for a total cumulative dose of 60.4 Gy.    ------------------------------------------------  Jodelle Gross, MD, PhD

## 2021-03-28 ENCOUNTER — Other Ambulatory Visit: Payer: Self-pay | Admitting: Hematology

## 2021-03-28 LAB — HEMOGLOBIN A1C
Hgb A1c MFr Bld: 5.4 % of total Hgb (ref <5.7)
Mean Plasma Glucose: 108 mg/dL
eAG (mmol/L): 6 mmol/L

## 2021-03-28 LAB — LIPID PANEL
Cholesterol: 258 mg/dL — ABNORMAL HIGH (ref <200)
HDL: 42 mg/dL — ABNORMAL LOW (ref >=50)
LDL Cholesterol (Calc): 189 mg/dL (calc) — ABNORMAL HIGH
Non-HDL Cholesterol (Calc): 216 mg/dL (calc) — ABNORMAL HIGH (ref <130)
Total CHOL/HDL Ratio: 6.1 (calc) — ABNORMAL HIGH (ref <5.0)
Triglycerides: 131 mg/dL (ref <150)

## 2021-03-28 LAB — HIV ANTIBODY (ROUTINE TESTING W REFLEX): HIV 1&2 Ab, 4th Generation: NONREACTIVE

## 2021-03-28 LAB — HEPATITIS C ANTIBODY
Hepatitis C Ab: NONREACTIVE
SIGNAL TO CUT-OFF: 0.01 (ref <1.00)

## 2021-03-28 LAB — TSH: TSH: 0.47 mIU/L

## 2021-03-28 LAB — T4, FREE: Free T4: 0.8 ng/dL (ref 0.8–1.8)

## 2021-03-31 ENCOUNTER — Other Ambulatory Visit: Payer: Self-pay | Admitting: Internal Medicine

## 2021-03-31 ENCOUNTER — Encounter: Payer: Self-pay | Admitting: Internal Medicine

## 2021-04-01 ENCOUNTER — Other Ambulatory Visit: Payer: Self-pay | Admitting: Internal Medicine

## 2021-04-02 ENCOUNTER — Encounter: Payer: Self-pay | Admitting: Hematology

## 2021-04-02 MED ORDER — LEVOTHYROXINE SODIUM 112 MCG PO TABS: 112.0000 ug | ORAL_TABLET | Freq: Every day | ORAL | 2 refills | Status: DC

## 2021-04-02 MED ORDER — ATORVASTATIN CALCIUM 10 MG PO TABS: 10.0000 mg | ORAL_TABLET | Freq: Every day | ORAL | 1 refills | Status: DC

## 2021-04-02 MED ORDER — FLUOXETINE HCL 10 MG PO CAPS: 10.0000 mg | ORAL_CAPSULE | Freq: Every day | ORAL | 2 refills | Status: DC

## 2021-04-02 NOTE — Progress Notes (Signed)
                                                                                                                                                             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   Intent: Curative  Radiation Treatment Dates: 02/05/2021 through 03/26/2021 Site Technique Total Dose (Gy) Dose per Fx (Gy) Completed Fx Beam Energies  Breast, Left: Breast_Lt 3D 50.4/50.4 1.8 28/28 6X  Breast, Left: Breast_Lt_Bst 3D 10/10 2 5/5 6X   Narrative: The patient tolerated radiation therapy relatively well. She developed moist desquamation toward the end of treatment in the inframammary fold of the left breast and was given silvadene.  Plan: The patient will receive a call in about one month from the radiation oncology department. She will continue follow up with Dr. Burr Medico as well.  ________________________________________________    Carola Rhine, [facility]

## 2021-04-02 NOTE — Progress Notes (Signed)
She responded via [employer]

## 2021-04-02 NOTE — Addendum Note (Signed)
Addended by: Wilson Singer on: 04/02/2021 11:25 AM   Modules accepted: Orders

## 2021-04-03 MED ORDER — CYCLOBENZAPRINE HCL 10 MG PO TABS
10.0000 mg | ORAL_TABLET | Freq: Every day | ORAL | 0 refills | Status: DC | PRN
Start: 2021-04-03 — End: 2021-04-30

## 2021-04-03 MED ORDER — LEVOCETIRIZINE DIHYDROCHLORIDE 5 MG PO TABS: ORAL_TABLET | ORAL | 3 refills | Status: DC

## 2021-04-08 ENCOUNTER — Encounter: Payer: Self-pay | Admitting: Hematology

## 2021-04-14 ENCOUNTER — Telehealth: Payer: Self-pay | Admitting: Nurse Practitioner

## 2021-04-14 ENCOUNTER — Other Ambulatory Visit: Payer: Self-pay | Admitting: Radiation Oncology

## 2021-04-14 DIAGNOSIS — Z170 Estrogen receptor positive status [ER+]: Secondary | ICD-10-CM

## 2021-04-14 DIAGNOSIS — C50412 Malignant neoplasm of upper-outer quadrant of left female breast: Secondary | ICD-10-CM

## 2021-04-14 NOTE — Telephone Encounter (Signed)
Cancelled appt per 8/8 sch msg. Pt aware.

## 2021-04-17 ENCOUNTER — Other Ambulatory Visit: Payer: Self-pay

## 2021-04-17 ENCOUNTER — Ambulatory Visit: Payer: BC Managed Care – PPO | Attending: Radiation Oncology | Admitting: Physical Therapy

## 2021-04-17 ENCOUNTER — Other Ambulatory Visit: Payer: Self-pay | Admitting: Internal Medicine

## 2021-04-17 DIAGNOSIS — M25512 Pain in left shoulder: Secondary | ICD-10-CM | POA: Diagnosis present

## 2021-04-17 DIAGNOSIS — M25612 Stiffness of left shoulder, not elsewhere classified: Secondary | ICD-10-CM | POA: Diagnosis present

## 2021-04-17 DIAGNOSIS — M6281 Muscle weakness (generalized): Secondary | ICD-10-CM | POA: Insufficient documentation

## 2021-04-17 DIAGNOSIS — L599 Disorder of the skin and subcutaneous tissue related to radiation, unspecified: Secondary | ICD-10-CM | POA: Insufficient documentation

## 2021-04-17 DIAGNOSIS — Z483 Aftercare following surgery for neoplasm: Secondary | ICD-10-CM | POA: Diagnosis present

## 2021-04-17 DIAGNOSIS — Z170 Estrogen receptor positive status [ER+]: Secondary | ICD-10-CM | POA: Insufficient documentation

## 2021-04-17 DIAGNOSIS — C50412 Malignant neoplasm of upper-outer quadrant of left female breast: Secondary | ICD-10-CM | POA: Diagnosis present
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
## 2021-04-18 ENCOUNTER — Telehealth: Payer: Self-pay

## 2021-04-18 ENCOUNTER — Other Ambulatory Visit: Payer: BC Managed Care – PPO

## 2021-04-18 NOTE — Telephone Encounter (Signed)
This nurse attempted to reach patient related to completing her return to work form.  Left a message for patient to return call to clinic.

## 2021-04-21 ENCOUNTER — Ambulatory Visit: Payer: BC Managed Care – PPO

## 2021-04-21 ENCOUNTER — Telehealth: Payer: Self-pay | Admitting: *Deleted

## 2021-04-21 NOTE — Telephone Encounter (Signed)
No return call from patient per collaborative nurse who called patient 04/18/2021 regarding request for letter with return to work form.  This nurse Connected with [name] [phone]).  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  Need intermittent leave for appointments and physical therapy.  Therapy will start this week.  Restrictions should include limited arm use.  I turned in two copies of a three page form not knowing who would complete it."  No "FMLA form" received with return to work form.   Surgeon initiated out of work request may be required to authorize return.  [facility] will submit our additional information for "Return to Work" regarding care received during summer out of work post lumpectomy.

## 2021-04-22 ENCOUNTER — Encounter: Payer: Self-pay | Admitting: Hematology

## 2021-04-22 NOTE — Telephone Encounter (Signed)
"  [name]([phone]).  My employer is having problems with the fax machine.  Could you e-mail my form to [email or web address]."

## 2021-04-23 ENCOUNTER — Ambulatory Visit: Payer: BC Managed Care – PPO

## 2021-04-23 ENCOUNTER — Other Ambulatory Visit: Payer: Self-pay

## 2021-04-23 ENCOUNTER — Telehealth: Payer: Self-pay | Admitting: *Deleted

## 2021-04-23 DIAGNOSIS — M6281 Muscle weakness (generalized): Secondary | ICD-10-CM

## 2021-04-23 DIAGNOSIS — L599 Disorder of the skin and subcutaneous tissue related to radiation, unspecified: Secondary | ICD-10-CM

## 2021-04-23 DIAGNOSIS — Z483 Aftercare following surgery for neoplasm: Secondary | ICD-10-CM

## 2021-04-23 DIAGNOSIS — M25512 Pain in left shoulder: Secondary | ICD-10-CM

## 2021-04-23 DIAGNOSIS — M25612 Stiffness of left shoulder, not elsewhere classified: Secondary | ICD-10-CM

## 2021-04-23 DIAGNOSIS — C50412 Malignant neoplasm of upper-outer quadrant of left female breast: Secondary | ICD-10-CM

## 2021-04-23 DIAGNOSIS — Z170 Estrogen receptor positive status [ER+]: Secondary | ICD-10-CM

## 2021-04-23 NOTE — Therapy (Signed)
[place], [place], [address] Phone: [phone]   Fax:  [phone]  Physical Therapy Treatment  Patient Details  Name: [name] MRN: [id number] Date of Birth: Dec 31, 1970 Referring Provider (PT): Shona Simpson   Encounter Date: 04/23/2021   PT End of Session - 04/23/21 1100     Visit Number 2    Number of Visits 9    Date for PT Re-Evaluation 05/19/21    PT Start Time 1010    PT Stop Time 1103    PT Time Calculation (min) 53 min    Activity Tolerance Patient tolerated treatment well    Behavior During Therapy [facility] for tasks assessed/performed             Past Medical History:  Diagnosis Date   Allergy    Anemia    present for many years, mild, stable   Anxiety    Breast cancer ([place])    Breast cancer ([place]) 12/18/2020   Dysrhythmia    PAC's on flecanide (did not tolerate beta blockers)   Family history of breast cancer 01/20/2021   Family history of prostate cancer 01/20/2021   Frequent headaches    HSV infection    fever blisters   Hypertension    Hypothyroidism     Past Surgical History:  Procedure Laterality Date   APPENDECTOMY  2013   BREAST LUMPECTOMY WITH RADIOACTIVE SEED LOCALIZATION Left 12/18/2020   Procedure: LEFT BREAST LUMPECTOMY WITH RADIOACTIVE SEED LOCALIZATION;  Surgeon: Erroll Luna, MD;  Location: [place];  Service: General;  Laterality: Left;   KNEE ARTHROSCOPY Right    lapband  2013   SENTINEL NODE BIOPSY Left 01/07/2021   Procedure: LEFT SENTINEL NODE BIOPSY;  Surgeon: Erroll Luna, MD;  Location: [place];  Service: General;  Laterality: Left;   TONSILLECTOMY  2012    There were no vitals filed for this visit.   Subjective Assessment - 04/23/21 1019     Subjective Nothing new since the evaluation.    Pertinent History left beast cancer with lumpectomy and a second surgery with 2 nodes removed, Pt had 28 treatments of radiation,  Pt said the she had some type of reaction after surery and her left breast swelled severly    Patient Stated Goals to get rid of the pain    Currently in Pain? Yes    Pain Score 2     Pain Location Breast    Pain Orientation Left    Pain Descriptors / Indicators Tender    Pain Type Acute pain    Pain Radiating Towards across the chest    Pain Onset 1 to 4 weeks ago    Pain Frequency Intermittent    Aggravating Factors  just as I move my arm more throughout the day    Pain Relieving Factors resting                               [facility] Adult PT Treatment/Exercise - 04/23/21 0001       Shoulder Exercises: Pulleys   Flexion 2 minutes    Flexion Limitations Pt returned therapist demo and VCs to decrease Lt scapular compensation    ABduction 2 minutes    ABduction Limitations Pt returned therapist demo and VCs to decrease Lt scapular compensation      Manual Therapy   Manual Therapy Myofascial release;Scapular mobilization;Manual Lymphatic Drainage (MLD);Passive ROM  Myofascial Release To Lt axilla during P/ROM    Scapular Mobilization In Rt S/L to Lt into protraction and retraction , scapular depression during P/ROM    Manual Lymphatic Drainage (MLD) In supine: Short neck, 5 diaphragmatic breaths, Lt inguinal and Rt axillary nodes, anterior inter-axillary (being mindful of healing blister at superior breast from radiation) and Lt axillo-inguinal anastomosis then Lt upper arm redirecting towards anastomosis    Passive ROM In Supine to Lt shoulder into flexion, abduction and D2 to pts tolerance                         PT Long Term Goals - 04/17/21 1700       PT LONG TERM GOAL #1   Title Pt will report the pain in her left upper quadrant is reduced to 2/10 at the most    Baseline 5/10    Time 4    Period Weeks    Status New      PT LONG TERM GOAL #2   Title Pt will be independent in a home exericse program for left shoudler stretching and  strengthening    Time 4    Period Weeks    Status New      PT LONG TERM GOAL #3   Title Pt will report she is knowledgable about lymphedem risk reduction practices    Time 4    Period Weeks    Status New                   Plan - 04/23/21 1101     Clinical Impression Statement First session of manual therapy working on nerve densensitization at Lt axilla and lateral breast. Pt moderately tender to touch but this semed to improve some during session. Also began AA/ROM stretches with pulleys at end of session. Pt required  multiple tactile and VCs to decrease Lt scapular compensation throughout due to increased muscle guarding. She repors some increased tenderness at Lt lateral trunk at end of session.    Personal Factors and Comorbidities Comorbidity 3+    Comorbidities 2 surgeries on left breast, abnormal swelling reaction after second breast surgey, radiation    Examination-Activity Limitations Reach Overhead    Examination-Participation Restrictions Yard Work;Laundry;Cleaning;Driving;Meal Prep;Shop;Occupation    Stability/Clinical Decision Making Stable/Uncomplicated    Rehab Potential Excellent    PT Frequency 2x / week    PT Duration 4 weeks    PT Treatment/Interventions ADLs/Self Care Home Management;Functional mobility training;Therapeutic activities;Therapeutic exercise;Neuromuscular re-education;Manual techniques;Manual lymph drainage;Compression bandaging;Scar mobilization;Taping;Passive range of motion;Dry needling    PT Next Visit Plan Soft tissue work to left upper quadrant tight areas MLD to fullness in left upper arm and lateral chest  Instruct in postural exercises and cont pulleys and add ball roll up wall, progress to wall stretches and UE strength exercises    Consulted and Agree with Plan of Care Patient             Patient will benefit from skilled therapeutic intervention in order to improve the following deficits and impairments:  Decreased activity  tolerance, Decreased knowledge of use of DME, Decreased safety awareness, Decreased strength, Increased fascial restricitons, Impaired UE functional use, Postural dysfunction, Pain, Obesity, Impaired perceived functional ability, Decreased range of motion, Increased muscle spasms  Visit Diagnosis: Disorder of the skin and subcutaneous tissue related to radiation, unspecified  Malignant neoplasm of upper-outer quadrant of left breast in female, estrogen receptor positive [facility])  Aftercare following surgery  for neoplasm  Acute pain of left shoulder  Stiffness of left shoulder, not elsewhere classified  Muscle weakness (generalized)     Problem List Patient Active Problem List   Diagnosis Date Noted   Prediabetes 03/27/2021   Class 2 severe obesity due to excess calories with serious comorbidity and body mass index (BMI) of 36.0 to 36.9 in adult ([place]) 03/27/2021   B12 deficiency anemia 01/19/2021   PVC (premature ventricular contraction) 01/17/2021   Iron deficiency anemia 01/17/2021   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

## 2021-04-23 NOTE — Telephone Encounter (Signed)
"  [name] [phone]).  Employer received form.  Did you send a letter to disability company?  No formal letter from [employer]; they need ability to return to work, the date and any restrictions."    Advised this nurse noted her handwritten note requesting letter for [employer] claim no: [id number] be faxed to [phone] last evening.  Message left for collaborative yesterday along with submitting the [place] RTW form to [employer] in the interim.     I'll call [employer].  Will let you all know if a letter is still needed."

## 2021-04-24 ENCOUNTER — Ambulatory Visit: Payer: BC Managed Care – PPO

## 2021-04-24 DIAGNOSIS — Z483 Aftercare following surgery for neoplasm: Secondary | ICD-10-CM

## 2021-04-24 DIAGNOSIS — Z170 Estrogen receptor positive status [ER+]: Secondary | ICD-10-CM

## 2021-04-24 DIAGNOSIS — C50412 Malignant neoplasm of upper-outer quadrant of left female breast: Secondary | ICD-10-CM

## 2021-04-24 DIAGNOSIS — L599 Disorder of the skin and subcutaneous tissue related to radiation, unspecified: Secondary | ICD-10-CM

## 2021-04-24 DIAGNOSIS — M25512 Pain in left shoulder: Secondary | ICD-10-CM

## 2021-04-24 DIAGNOSIS — M6281 Muscle weakness (generalized): Secondary | ICD-10-CM

## 2021-04-24 DIAGNOSIS — M25612 Stiffness of left shoulder, not elsewhere classified: Secondary | ICD-10-CM

## 2021-04-24 NOTE — Therapy (Signed)
[place], [place], [address] Phone: [phone]   Fax:  [phone]  Physical Therapy Treatment  Patient Details  Name: [name] MRN: [id number] Date of Birth: December 17, 1970 Referring Provider (PT): Shona Simpson   Encounter Date: 04/24/2021   PT End of Session - 04/24/21 0857     Visit Number 3    Number of Visits 9    Date for PT Re-Evaluation 05/19/21    PT Start Time 0804    PT Stop Time [id number]    PT Time Calculation (min) 50 min    Activity Tolerance Patient tolerated treatment well    Behavior During Therapy [facility] for tasks assessed/performed             Past Medical History:  Diagnosis Date   Allergy    Anemia    present for many years, mild, stable   Anxiety    Breast cancer ([place])    Breast cancer ([place]) 12/18/2020   Dysrhythmia    PAC's on flecanide (did not tolerate beta blockers)   Family history of breast cancer 01/20/2021   Family history of prostate cancer 01/20/2021   Frequent headaches    HSV infection    fever blisters   Hypertension    Hypothyroidism     Past Surgical History:  Procedure Laterality Date   APPENDECTOMY  2013   BREAST LUMPECTOMY WITH RADIOACTIVE SEED LOCALIZATION Left 12/18/2020   Procedure: LEFT BREAST LUMPECTOMY WITH RADIOACTIVE SEED LOCALIZATION;  Surgeon: Erroll Luna, MD;  Location: [place];  Service: General;  Laterality: Left;   KNEE ARTHROSCOPY Right    lapband  2013   SENTINEL NODE BIOPSY Left 01/07/2021   Procedure: LEFT SENTINEL NODE BIOPSY;  Surgeon: Erroll Luna, MD;  Location: [place];  Service: General;  Laterality: Left;   TONSILLECTOMY  2012    There were no vitals filed for this visit.   Subjective Assessment - 04/24/21 0805     Subjective Had a lot of tenderness at left breast after last visit.  Haven't taken meds yet. pain 4-5/10 at lateral breast and armpit    Pertinent History left beast cancer with  lumpectomy and a second surgery with 2 nodes removed, Pt had 28 treatments of radiation, Pt said the she had some type of reaction after surery and her left breast swelled severly    Patient Stated Goals to get rid of the pain    Currently in Pain? Yes    Pain Score 5     Pain Location Breast    Pain Orientation Left    Pain Descriptors / Indicators Tender    Pain Type Acute pain    Pain Onset 1 to 4 weeks ago    Pain Frequency Intermittent                               [facility] Adult PT Treatment/Exercise - 04/24/21 0001       Lumbar Exercises: Supine   Other Supine Lumbar Exercises lumbar rotation knees to right x5      Shoulder Exercises: Supine   Other Supine Exercises AAROM flexion, stargazer,  Wallslides x5    Other Supine Exercises supine wand flex and scaption x 5      Shoulder Exercises: Standing   Other Standing Exercises standing lat stretch x3      Manual Therapy   Manual Therapy Soft tissue mobilization;Myofascial release;Passive ROM  Soft tissue mobilization Left pectoralis, UT, supine and SL lats, serratus, and interscapular area    Myofascial Release To Lt axilla during P/ROM    Passive ROM In Supine to Lt shoulder into flexion, abduction and D2 to pts tolerance                    PT Education - 04/24/21 0856     Education Details wall slides, supine wand flex and scaption, stargazer, standing lat stretch all to gentle stretch;no pain    Person(s) Educated Patient    Methods Demonstration;Handout    Comprehension Returned demonstration                 PT Long Term Goals - 04/17/21 1700       PT LONG TERM GOAL #1   Title Pt will report the pain in her left upper quadrant is reduced to 2/10 at the most    Baseline 5/10    Time 4    Period Weeks    Status New      PT LONG TERM GOAL #2   Title Pt will be independent in a home exericse program for left shoudler stretching and strengthening    Time 4    Period Weeks     Status New      PT LONG TERM GOAL #3   Title Pt will report she is knowledgable about lymphedem risk reduction practices    Time 4    Period Weeks    Status New                   Plan - 04/24/21 [id number]     Clinical Impression Statement performed soft tissue mobilization to the left upper quarter with multiple tenderpoints noted. Pt was instructed in [place] for HEP and was instructed to perform 2xs daily to gentle stretch and not increased pain. She tends to guard with PROM and requires VC's to relax.  She reported fatigue at end of session but no increased pain.  Does not feel arm swells but by end of day her breast swells.  She is trying to wear her compression bra.    Personal Factors and Comorbidities Comorbidity 3+    Comorbidities 2 surgeries on left breast, abnormal swelling reaction after second breast surgey, radiation    Examination-Activity Limitations Reach Overhead    Examination-Participation Restrictions Yard Work;Laundry;Cleaning;Driving;Meal Prep;Shop;Occupation    Stability/Clinical Decision Making Stable/Uncomplicated    Rehab Potential Excellent    PT Frequency 2x / week    PT Duration 4 weeks    PT Treatment/Interventions ADLs/Self Care Home Management;Functional mobility training;Therapeutic activities;Therapeutic exercise;Neuromuscular re-education;Manual techniques;Manual lymph drainage;Compression bandaging;Scar mobilization;Taping;Passive range of motion;Dry needling    PT Next Visit Plan Soft tissue work to left upper quadrant tight areas MLD to fullness in left upper arm and lateral chest  Instruct in postural exercises and cont pulleys and add ball roll up wall, progress to wall stretches and UE strength exercises    PT Home Exercise Plan supine wand flex, scaption, stargazer, standing lat stretch, wall slides    Consulted and Agree with Plan of Care Patient             Patient will benefit from skilled therapeutic intervention in order to  improve the following deficits and impairments:  Decreased activity tolerance, Decreased knowledge of use of DME, Decreased safety awareness, Decreased strength, Increased fascial restricitons, Impaired UE functional use, Postural dysfunction, Pain, Obesity, Impaired perceived functional ability, Decreased  range of motion, Increased muscle spasms  Visit Diagnosis: Disorder of the skin and subcutaneous tissue related to radiation, unspecified  Malignant neoplasm of upper-outer quadrant of left breast in female, estrogen receptor positive ([place])  Aftercare following surgery for neoplasm  Acute pain of left shoulder  Stiffness of left shoulder, not elsewhere classified  Muscle weakness (generalized)     Problem List Patient Active Problem List   Diagnosis Date Noted   Prediabetes 03/27/2021   Class 2 severe obesity due to excess calories with serious comorbidity and body mass index (BMI) of 36.0 to 36.9 in adult ([place]) 03/27/2021   B12 deficiency anemia 01/19/2021   PVC (premature ventricular contraction) 01/17/2021   Iron deficiency anemia 01/17/2021   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

## 2021-04-24 NOTE — Patient Instructions (Addendum)
SHOULDER: Flexion - Supine (Cane)        Cancer Rehab [phone]    Hold cane in both hands. Raise arms up overhead. Do not allow back to arch. Hold _5__ seconds. Do __5-10__ times; __1-2__ times a day.  Hands Hands shoulder width apart Hands slightly wider apart (V) position   Shoulder Blade Stretch    Clasp fingers behind head with elbows touching in front of face. Pull elbows back while pressing shoulder blades together. Relax and hold as tolerated, can place pillow under elbow here for comfort as needed and to allow for prolonged stretch.  Repeat __5__ times. Do __1-2__ sessions per day.  Copyright  VHI. All rights reserved.     Standing lat stretch at counter  Standing wall slides abd

## 2021-04-25 ENCOUNTER — Other Ambulatory Visit: Payer: Self-pay | Admitting: Internal Medicine

## 2021-04-25 ENCOUNTER — Other Ambulatory Visit: Payer: Self-pay | Admitting: Hematology

## 2021-04-25 NOTE — Telephone Encounter (Signed)
Requested medication (s) are due for refill today: no , requesting 90 day subscription  Requested medication (s) are on the active medication list: yes  Last refill:  04/02/21 #30 2 refills  Future visit scheduled: no  Notes to clinic:  requesting 90 day refill. Can patient get #60 1 refill?     Requested Prescriptions  Pending Prescriptions Disp Refills   FLUoxetine (PROZAC) 10 MG capsule [Pharmacy Med Name: FLUOXETINE HCL 10 MG CAPSULE] 90 capsule 1    Sig: TAKE 1 CAPSULE BY MOUTH EVERY DAY     Psychiatry:  Antidepressants - SSRI Passed - 04/25/2021 10:32 AM      Passed - Valid encounter within last 6 months    Recent Outpatient Visits           4 weeks ago Acute non-recurrent frontal sinusitis   [facility] [place], Coralie Keens, NP   3 months ago Encounter for general adult medical examination with abnormal findings   [facility] [address] [place], Coralie Keens, NP

## 2021-04-29 ENCOUNTER — Ambulatory Visit: Payer: BC Managed Care – PPO | Admitting: Physical Therapy

## 2021-04-29 ENCOUNTER — Encounter: Payer: Self-pay | Admitting: Physical Therapy

## 2021-04-29 ENCOUNTER — Other Ambulatory Visit: Payer: Self-pay

## 2021-04-29 DIAGNOSIS — C50412 Malignant neoplasm of upper-outer quadrant of left female breast: Secondary | ICD-10-CM

## 2021-04-29 DIAGNOSIS — M6281 Muscle weakness (generalized): Secondary | ICD-10-CM

## 2021-04-29 DIAGNOSIS — L599 Disorder of the skin and subcutaneous tissue related to radiation, unspecified: Secondary | ICD-10-CM | POA: Diagnosis not present

## 2021-04-29 DIAGNOSIS — M25512 Pain in left shoulder: Secondary | ICD-10-CM

## 2021-04-29 DIAGNOSIS — Z483 Aftercare following surgery for neoplasm: Secondary | ICD-10-CM

## 2021-04-29 DIAGNOSIS — Z170 Estrogen receptor positive status [ER+]: Secondary | ICD-10-CM

## 2021-04-29 DIAGNOSIS — M25612 Stiffness of left shoulder, not elsewhere classified: Secondary | ICD-10-CM

## 2021-04-29 NOTE — Therapy (Signed)
[place], [place], [address] Phone: [phone]   Fax:  [phone]  Physical Therapy Treatment  Patient Details  Name: [name] MRN: [id number] Date of Birth: May 03, 1971 Referring Provider (PT): Shona Simpson   Encounter Date: 04/29/2021   PT End of Session - 04/29/21 1703     Visit Number 4    Number of Visits 9    Date for PT Re-Evaluation 05/19/21    PT Start Time 1600    PT Stop Time 1645    PT Time Calculation (min) 45 min    Activity Tolerance Patient tolerated treatment well    Behavior During Therapy [facility] for tasks assessed/performed             Past Medical History:  Diagnosis Date   Allergy    Anemia    present for many years, mild, stable   Anxiety    Breast cancer ([place])    Breast cancer ([place]) 12/18/2020   Dysrhythmia    PAC's on flecanide (did not tolerate beta blockers)   Family history of breast cancer 01/20/2021   Family history of prostate cancer 01/20/2021   Frequent headaches    HSV infection    fever blisters   Hypertension    Hypothyroidism     Past Surgical History:  Procedure Laterality Date   APPENDECTOMY  2013   BREAST LUMPECTOMY WITH RADIOACTIVE SEED LOCALIZATION Left 12/18/2020   Procedure: LEFT BREAST LUMPECTOMY WITH RADIOACTIVE SEED LOCALIZATION;  Surgeon: Erroll Luna, MD;  Location: [place];  Service: General;  Laterality: Left;   KNEE ARTHROSCOPY Right    lapband  2013   SENTINEL NODE BIOPSY Left 01/07/2021   Procedure: LEFT SENTINEL NODE BIOPSY;  Surgeon: Erroll Luna, MD;  Location: [place];  Service: General;  Laterality: Left;   TONSILLECTOMY  2012    There were no vitals filed for this visit.   Subjective Assessment - 04/29/21 1607     Subjective Pt states that she went back to work yesterday and she has been doing professional development that requires her using her arm.  She had swelling in her armpit today  that seemed to get better when she went home to lie down for a bit.  She wore compression bra today and she thinks it helped reduce the swelling a bit.  she still has open area on her breast, but it is improved    Pertinent History left beast cancer with lumpectomy and a second surgery with 2 nodes removed, Pt had 28 treatments of radiation, Pt said the she had some type of reaction after surgery and her left breast swelled severly    Patient Stated Goals to get rid of the pain    Currently in Pain? Yes    Pain Score 6     Pain Location Axilla    Pain Orientation Left    Pain Descriptors / Indicators Sore;Sharp    Pain Type Acute pain    Pain Radiating Towards down to nipple    Pain Onset 1 to 4 weeks ago    Pain Frequency Intermittent    Aggravating Factors  standing,    Pain Relieving Factors resting an putting her arm up over her head                               [facility] Adult PT Treatment/Exercise - 04/29/21 0001  Manual Therapy   Manual Therapy Soft tissue mobilization;Manual Lymphatic Drainage (MLD)    Manual therapy comments gave pt small dotted foam to wear at axilla and adjusted bra to highest place so that it is futher up into axilla    Soft tissue mobilization in right sidelying with cocoa butter to tender areas at traps and psoterior shoudler    Manual Lymphatic Drainage (MLD) In supine: Short neck, 5 diaphragmatic breaths, Lt inguinal and Rt axillary nodes, anterior inter-axillary (being mindful of healing blister at superior breast from radiation) and Lt axillo-inguinal anastomosis then Lt upper arm redirecting towards anastomosis limited hand placement on tender areas of axilla                         PT Long Term Goals - 04/17/21 1700       PT LONG TERM GOAL #1   Title Pt will report the pain in her left upper quadrant is reduced to 2/10 at the most    Baseline 5/10    Time 4    Period Weeks    Status New      PT LONG TERM  GOAL #2   Title Pt will be independent in a home exericse program for left shoudler stretching and strengthening    Time 4    Period Weeks    Status New      PT LONG TERM GOAL #3   Title Pt will report she is knowledgable about lymphedem risk reduction practices    Time 4    Period Weeks    Status New                   Plan - 04/29/21 1703     Clinical Impression Statement Pt with increased tenderness and swelling in left axilla after working today.  She felt better and more relaxed at end of session. Encoureaged pt to continue to wear compression bra with patch at axilla    Comorbidities 2 surgeries on left breast, abnormal swelling reaction after second breast surgey, radiation    Examination-Activity Limitations Reach Overhead    Examination-Participation Restrictions Yard Work;Laundry;Cleaning;Driving;Meal Prep;Shop;Occupation    Stability/Clinical Decision Making Stable/Uncomplicated    Rehab Potential Excellent    PT Frequency 2x / week    PT Treatment/Interventions ADLs/Self Care Home Management;Functional mobility training;Therapeutic activities;Therapeutic exercise;Neuromuscular re-education;Manual techniques;Manual lymph drainage;Compression bandaging;Scar mobilization;Taping;Passive range of motion;Dry needling    PT Next Visit Plan Soft tissue work to left upper quadrant tight areas MLD to fullness in left upper arm and lateral chest  Instruct in postural exercises and cont pulleys and add ball roll up wall, progress to wall stretches and UE strength exercises    Consulted and Agree with Plan of Care Patient             Patient will benefit from skilled therapeutic intervention in order to improve the following deficits and impairments:     Visit Diagnosis: Disorder of the skin and subcutaneous tissue related to radiation, unspecified  Malignant neoplasm of upper-outer quadrant of left breast in female, estrogen receptor positive ([place])  Aftercare following  surgery for neoplasm  Acute pain of left shoulder  Stiffness of left shoulder, not elsewhere classified  Muscle weakness (generalized)     Problem List Patient Active Problem List   Diagnosis Date Noted   Prediabetes 03/27/2021   Class 2 severe obesity due to excess calories with serious comorbidity and body mass index (BMI) of 36.0  to 36.9 in adult [facility]) 03/27/2021   B12 deficiency anemia 01/19/2021   PVC (premature ventricular contraction) 01/17/2021   Iron deficiency anemia 01/17/2021   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 Owens Shark PT  Norwood Levo 04/29/2021, 5:05 PM  [place], [place], [address] Phone: [phone]   Fax:  [phone]  Name: Laurien Wander MRN: [id number] Date of Birth: 1971/04/06

## 2021-04-30 ENCOUNTER — Other Ambulatory Visit: Payer: Self-pay | Admitting: Internal Medicine

## 2021-04-30 MED ORDER — CYCLOBENZAPRINE HCL 10 MG PO TABS: 10.0000 mg | ORAL_TABLET | Freq: Every day | ORAL | 0 refills | Status: DC | PRN

## 2021-05-01 ENCOUNTER — Other Ambulatory Visit: Payer: Self-pay

## 2021-05-01 ENCOUNTER — Ambulatory Visit: Payer: BC Managed Care – PPO | Admitting: Physical Therapy

## 2021-05-01 DIAGNOSIS — M25612 Stiffness of left shoulder, not elsewhere classified: Secondary | ICD-10-CM

## 2021-05-01 DIAGNOSIS — L599 Disorder of the skin and subcutaneous tissue related to radiation, unspecified: Secondary | ICD-10-CM

## 2021-05-01 DIAGNOSIS — Z170 Estrogen receptor positive status [ER+]: Secondary | ICD-10-CM

## 2021-05-01 DIAGNOSIS — M25512 Pain in left shoulder: Secondary | ICD-10-CM

## 2021-05-01 DIAGNOSIS — M6281 Muscle weakness (generalized): Secondary | ICD-10-CM

## 2021-05-01 DIAGNOSIS — Z483 Aftercare following surgery for neoplasm: Secondary | ICD-10-CM

## 2021-05-01 DIAGNOSIS — C50412 Malignant neoplasm of upper-outer quadrant of left female breast: Secondary | ICD-10-CM

## 2021-05-01 NOTE — Therapy (Signed)
[place], [place], [address] Phone: [phone]   Fax:  [phone]  Physical Therapy Treatment  Patient Details  Name: [name] MRN: [id number] Date of Birth: July 24, 1971 Referring Provider (PT): Shona Simpson   Encounter Date: 05/01/2021   PT End of Session - 05/01/21 1741     Visit Number 5    Number of Visits 9    Date for PT Re-Evaluation 05/19/21    PT Start Time 1500    PT Stop Time [id number]    PT Time Calculation (min) 45 min    Activity Tolerance Patient tolerated treatment well    Behavior During Therapy [facility] for tasks assessed/performed             Past Medical History:  Diagnosis Date   Allergy    Anemia    present for many years, mild, stable   Anxiety    Breast cancer ([place])    Breast cancer ([place]) 12/18/2020   Dysrhythmia    PAC's on flecanide (did not tolerate beta blockers)   Family history of breast cancer 01/20/2021   Family history of prostate cancer 01/20/2021   Frequent headaches    HSV infection    fever blisters   Hypertension    Hypothyroidism     Past Surgical History:  Procedure Laterality Date   APPENDECTOMY  2013   BREAST LUMPECTOMY WITH RADIOACTIVE SEED LOCALIZATION Left 12/18/2020   Procedure: LEFT BREAST LUMPECTOMY WITH RADIOACTIVE SEED LOCALIZATION;  Surgeon: Erroll Luna, MD;  Location: [place];  Service: General;  Laterality: Left;   KNEE ARTHROSCOPY Right    lapband  2013   SENTINEL NODE BIOPSY Left 01/07/2021   Procedure: LEFT SENTINEL NODE BIOPSY;  Surgeon: Erroll Luna, MD;  Location: [place];  Service: General;  Laterality: Left;   TONSILLECTOMY  2012    There were no vitals filed for this visit.   Subjective Assessment - 05/01/21 1739     Subjective Pt was really busy at work today. She had to move furniture and clean her classroom.  Pt says she is sore all over.    Pertinent History left beast cancer with  lumpectomy and a second surgery with 2 nodes removed, Pt had 28 treatments of radiation, Pt said the she had some type of reaction after surgery and her left breast swelled severly    Patient Stated Goals to get rid of the pain    Currently in Pain? Yes    Pain Score --   did not rate   Pain Location Axilla    Pain Orientation Left    Pain Descriptors / Indicators Sore    Pain Type Acute pain    Pain Onset 1 to 4 weeks ago    Pain Frequency Intermittent                               [facility] Adult PT Treatment/Exercise - 05/01/21 0001       Manual Therapy   Manual Therapy Soft tissue mobilization;Manual Lymphatic Drainage (MLD)    Soft tissue mobilization in right sidelying with cocoa butter to tender areas at traps and psoterior shoudler    Manual Lymphatic Drainage (MLD) In supine: Short neck, 5 diaphragmatic breaths, Lt inguinal and Rt axillary nodes, anterior inter-axillary (being mindful of healing blister at superior breast from radiation) and Lt axillo-inguinal anastomosis then Lt upper arm redirecting towards anastomosis limited hand  placement on tender areas of axilla                         PT Long Term Goals - 04/17/21 1700       PT LONG TERM GOAL #1   Title Pt will report the pain in her left upper quadrant is reduced to 2/10 at the most    Baseline 5/10    Time 4    Period Weeks    Status New      PT LONG TERM GOAL #2   Title Pt will be independent in a home exericse program for left shoudler stretching and strengthening    Time 4    Period Weeks    Status New      PT LONG TERM GOAL #3   Title Pt will report she is knowledgable about lymphedem risk reduction practices    Time 4    Period Weeks    Status New                   Plan - 05/01/21 1741     Clinical Impression Statement Pt reports she does not have as many tender spots in her back as she has had but she still has a very tender area at posterior axilla near  lateral scapula.  Her skin appears improved today. She has some soreness with MLD but overall appears to be improving.    Personal Factors and Comorbidities Comorbidity 3+    Comorbidities 2 surgeries on left breast, abnormal swelling reaction after second breast surgey, radiation    Examination-Activity Limitations Reach Overhead    Examination-Participation Restrictions Yard Work;Laundry;Cleaning;Driving;Meal Prep;Shop;Occupation    Stability/Clinical Decision Making Stable/Uncomplicated    Rehab Potential Excellent    PT Frequency 2x / week    PT Duration 4 weeks    PT Treatment/Interventions ADLs/Self Care Home Management;Functional mobility training;Therapeutic activities;Therapeutic exercise;Neuromuscular re-education;Manual techniques;Manual lymph drainage;Compression bandaging;Scar mobilization;Taping;Passive range of motion;Dry needling    PT Next Visit Plan Soft tissue work to left upper quadrant tight areas MLD to fullness in left upper arm and lateral chest  Instruct in postural exercises and cont pulleys and add ball roll up wall, progress to wall stretches and UE strength exercises    PT Home Exercise Plan supine wand flex, scaption, stargazer, standing lat stretch, wall slides    Consulted and Agree with Plan of Care Patient             Patient will benefit from skilled therapeutic intervention in order to improve the following deficits and impairments:  Decreased activity tolerance, Decreased knowledge of use of DME, Decreased safety awareness, Decreased strength, Increased fascial restricitons, Impaired UE functional use, Postural dysfunction, Pain, Obesity, Impaired perceived functional ability, Decreased range of motion, Increased muscle spasms  Visit Diagnosis: Disorder of the skin and subcutaneous tissue related to radiation, unspecified  Malignant neoplasm of upper-outer quadrant of left breast in female, estrogen receptor positive ([place])  Aftercare following surgery  for neoplasm  Acute pain of left shoulder  Stiffness of left shoulder, not elsewhere classified  Muscle weakness (generalized)     Problem List Patient Active Problem List   Diagnosis Date Noted   Prediabetes 03/27/2021   Class 2 severe obesity due to excess calories with serious comorbidity and body mass index (BMI) of 36.0 to 36.9 in adult ([place]) 03/27/2021   B12 deficiency anemia 01/19/2021   PVC (premature ventricular contraction) 01/17/2021   Iron deficiency anemia 01/17/2021  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 Owens Shark PT  Norwood Levo 05/01/2021, 5:43 PM  [place], [place], [address] Phone: [phone]   Fax:  [phone]  Name: [name] MRN: [id number] Date of Birth: [birth year]

## 2021-05-05 ENCOUNTER — Ambulatory Visit: Payer: BC Managed Care – PPO

## 2021-05-05 ENCOUNTER — Other Ambulatory Visit: Payer: Self-pay

## 2021-05-05 ENCOUNTER — Ambulatory Visit
Admission: RE | Admit: 2021-05-05 | Discharge: 2021-05-05 | Disposition: A | Discharge status: Home / Self Care | Payer: BC Managed Care – PPO | Source: Ambulatory Visit | Attending: Radiation Oncology | Admitting: Radiation Oncology

## 2021-05-05 DIAGNOSIS — M25512 Pain in left shoulder: Secondary | ICD-10-CM

## 2021-05-05 DIAGNOSIS — L599 Disorder of the skin and subcutaneous tissue related to radiation, unspecified: Secondary | ICD-10-CM | POA: Diagnosis not present

## 2021-05-05 DIAGNOSIS — Z170 Estrogen receptor positive status [ER+]: Secondary | ICD-10-CM | POA: Insufficient documentation

## 2021-05-05 DIAGNOSIS — Z483 Aftercare following surgery for neoplasm: Secondary | ICD-10-CM

## 2021-05-05 DIAGNOSIS — M6281 Muscle weakness (generalized): Secondary | ICD-10-CM

## 2021-05-05 DIAGNOSIS — M25612 Stiffness of left shoulder, not elsewhere classified: Secondary | ICD-10-CM

## 2021-05-05 DIAGNOSIS — C50412 Malignant neoplasm of upper-outer quadrant of left female breast: Secondary | ICD-10-CM | POA: Insufficient documentation

## 2021-05-05 NOTE — Progress Notes (Signed)
  Radiation Oncology         [phone] ________________________________  Name: [name] MRN: [id number]  Date of Service: 05/05/2021  DOB: 05/30/1971  Post Treatment Telephone Note  Diagnosis:   Stage IA, cT1cNxM0 grade 2 invasive lobular carcinoma of the left breast.   Interval Since Last Radiation:  6 weeks   02/05/2021 through 03/26/2021 Site Technique Total Dose (Gy) Dose per Fx (Gy) Completed Fx Beam Energies  Breast, Left: Breast_Lt 3D 50.4/50.4 1.8 28/28 6X  Breast, Left: Breast_Lt_Bst 3D 10/10 2 5/5 6X   Narrative:  The patient was contacted today for routine follow-up. During treatment she did very well with radiotherapy and did develop desquamation.   Impression/Plan: 1. Stage IA, cT1cNxM0 grade 2 invasive lobular carcinoma of the left breast. I was unable to reach the patient but left a voicemail and on the message, I  discussed that we would be happy to continue to follow her as needed, but she will also continue to follow up with Dr. Burr Medico in medical oncology. She was counseled on skin care as well as measures to avoid sun exposure to this area.          Carola Rhine, PAC

## 2021-05-05 NOTE — Therapy (Signed)
[place], [place], [address] Phone: [phone]   Fax:  [phone]  Physical Therapy Treatment  Patient Details  Name: [name] MRN: [id number] Date of Birth: [birth year] Referring Provider (PT): Shona Simpson   Encounter Date: 05/05/2021   PT End of Session - 05/05/21 1713     Visit Number 6    Number of Visits 9    Date for PT Re-Evaluation 05/19/21    PT Start Time 1610    PT Stop Time 1706    PT Time Calculation (min) 56 min    Activity Tolerance Patient tolerated treatment well    Behavior During Therapy [facility] for tasks assessed/performed             Past Medical History:  Diagnosis Date   Allergy    Anemia    present for many years, mild, stable   Anxiety    Breast cancer ([place])    Breast cancer ([place]) 12/18/2020   Dysrhythmia    PAC's on flecanide (did not tolerate beta blockers)   Family history of breast cancer 01/20/2021   Family history of prostate cancer 01/20/2021   Frequent headaches    HSV infection    fever blisters   Hypertension    Hypothyroidism     Past Surgical History:  Procedure Laterality Date   APPENDECTOMY  2013   BREAST LUMPECTOMY WITH RADIOACTIVE SEED LOCALIZATION Left 12/18/2020   Procedure: LEFT BREAST LUMPECTOMY WITH RADIOACTIVE SEED LOCALIZATION;  Surgeon: Erroll Luna, MD;  Location: [place];  Service: General;  Laterality: Left;   KNEE ARTHROSCOPY Right    lapband  2013   SENTINEL NODE BIOPSY Left 01/07/2021   Procedure: LEFT SENTINEL NODE BIOPSY;  Surgeon: Erroll Luna, MD;  Location: [place];  Service: General;  Laterality: Left;   TONSILLECTOMY  2012    There were no vitals filed for this visit.   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 It's at the front of my Lt elbow and I can feel something bumpy  (this appears to be cording).    Pertinent History left beast cancer with lumpectomy and a second surgery with 2 nodes removed, Pt had 28 treatments of radiation, Pt said the she had some type of reaction after surgery and her left breast swelled severly    Patient Stated Goals to get rid of the pain    Currently in Pain? Yes    Pain Score 7     Pain Location Elbow    Pain Orientation Left    Pain Descriptors / Indicators Tightness;Sharp;Constant    Pain Type Acute pain    Pain Onset In the past 7 days    Pain Frequency Constant    Aggravating Factors  straightening my arm    Pain Relieving Factors bending my elbow                               [facility] Adult PT Treatment/Exercise - 05/05/21 0001       Shoulder Exercises: Pulleys   Flexion 2 minutes    Flexion Limitations Pt returned therapist demo, VCs to hold stretch    ABduction 2 minutes    ABduction Limitations VCs to relax shoulders throughout      Shoulder Exercises: Therapy Diona Foley  Flexion Both;10 reps   forward lean into end of stretch   Flexion Limitations Pt returned therapist demo      Manual Therapy   Soft tissue mobilization in right sidelying with cocoa butter to tender areas at Lt medial scapular border and lateral trunk at areas of trigger points    Myofascial Release to new area of cording at Lt antecubital fossa but this was only mildly palpable and much improved by end of session    Manual Lymphatic Drainage (MLD) In supine: Short neck, 5 diaphragmatic breaths, Lt inguinal and Rt axillary nodes, anterior inter-axillary (being mindful of healing blister at superior breast from radiation) and Lt axillo-inguinal anastomosis then Lt UE working from proximal to distal then redirecting towards anastomosis    Passive ROM To Lt shoulder into abduction during MFR                         PT Long Term Goals - 04/17/21 1700       PT LONG TERM GOAL #1   Title Pt will report the pain in  her left upper quadrant is reduced to 2/10 at the most    Baseline 5/10    Time 4    Period Weeks    Status New      PT LONG TERM GOAL #2   Title Pt will be independent in a home exericse program for left shoudler stretching and strengthening    Time 4    Period Weeks    Status New      PT LONG TERM GOAL #3   Title Pt will report she is knowledgable about lymphedem risk reduction practices    Time 4    Period Weeks    Status New                   Plan - 05/05/21 1714     Clinical Impression Statement Pt reports tenderness continues to improve as there are less areas of tenderness now and less palpable trigger points. There was some new mild cording at her Lt antecubital fossa but this was easliy resolved today with MFR and pt reports this feeling much better by end of session. Also included her Lt UE with MLD to facilitate lymphatic flow to reduce stress to lymphatic vessels here. 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 Encouraged her to cont stretching if she feels this again and she verbalized good understanding. Her skin was much improved today and did not have to avoid any areas during MLD.    Personal Factors and Comorbidities Comorbidity 3+    Comorbidities 2 surgeries on left breast, abnormal swelling reaction after second breast surgey, radiation    Examination-Activity Limitations Reach Overhead    Examination-Participation Restrictions Yard Work;Laundry;Cleaning;Driving;Meal Prep;Shop;Occupation    Stability/Clinical Decision Making Stable/Uncomplicated    Rehab Potential Excellent    PT Frequency 2x / week    PT Duration 4 weeks    PT Treatment/Interventions ADLs/Self Care Home Management;Functional mobility training;Therapeutic activities;Therapeutic exercise;Neuromuscular re-education;Manual techniques;Manual lymph drainage;Compression bandaging;Scar mobilization;Taping;Passive  range of motion;Dry needling    PT Next Visit Plan How is cording at Lt antecubital fossa? Soft tissue work to left upper quadrant tight areas MLD to fullness in left upper arm and lateral chest  Instruct in postural exercises and cont pulleys and add ball roll up wall, progress to wall stretches and UE strength exercises  PT Home Exercise Plan supine wand flex, scaption, stargazer, standing lat stretch, wall slides    Consulted and Agree with Plan of Care Patient             Patient will benefit from skilled therapeutic intervention in order to improve the following deficits and impairments:  Decreased activity tolerance, Decreased knowledge of use of DME, Decreased safety awareness, Decreased strength, Increased fascial restricitons, Impaired UE functional use, Postural dysfunction, Pain, Obesity, Impaired perceived functional ability, Decreased range of motion, Increased muscle spasms  Visit Diagnosis: Disorder of the skin and subcutaneous tissue related to radiation, unspecified  Malignant neoplasm of upper-outer quadrant of left breast in female, estrogen receptor positive ([place])  Aftercare following surgery for neoplasm  Acute pain of left shoulder  Stiffness of left shoulder, not elsewhere classified  Muscle weakness (generalized)     Problem List Patient Active Problem List   Diagnosis Date Noted   Prediabetes 03/27/2021   Class 2 severe obesity due to excess calories with serious comorbidity and body mass index (BMI) of 36.0 to 36.9 in adult ([place]) 03/27/2021   B12 deficiency anemia 01/19/2021   PVC (premature ventricular contraction) 01/17/2021   Iron deficiency anemia 01/17/2021   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

## 2021-05-06 ENCOUNTER — Encounter: Payer: Self-pay | Admitting: Physical Therapy

## 2021-05-06 ENCOUNTER — Ambulatory Visit: Payer: BC Managed Care – PPO | Admitting: Physical Therapy

## 2021-05-06 ENCOUNTER — Other Ambulatory Visit: Payer: Self-pay | Admitting: Family

## 2021-05-06 DIAGNOSIS — Z170 Estrogen receptor positive status [ER+]: Secondary | ICD-10-CM

## 2021-05-06 DIAGNOSIS — M25512 Pain in left shoulder: Secondary | ICD-10-CM

## 2021-05-06 DIAGNOSIS — M25612 Stiffness of left shoulder, not elsewhere classified: Secondary | ICD-10-CM

## 2021-05-06 DIAGNOSIS — C50412 Malignant neoplasm of upper-outer quadrant of left female breast: Secondary | ICD-10-CM

## 2021-05-06 DIAGNOSIS — M6281 Muscle weakness (generalized): Secondary | ICD-10-CM

## 2021-05-06 DIAGNOSIS — L599 Disorder of the skin and subcutaneous tissue related to radiation, unspecified: Secondary | ICD-10-CM | POA: Diagnosis not present

## 2021-05-06 DIAGNOSIS — Z483 Aftercare following surgery for neoplasm: Secondary | ICD-10-CM

## 2021-05-06 NOTE — Therapy (Signed)
[place], [place], [address] Phone: [phone]   Fax:  [phone]  Physical Therapy Treatment  Patient Details  Name: [name] MRN: [id number] Date of Birth: May 04, 1971 Referring Provider (PT): Shona Simpson   Encounter Date: 05/06/2021   PT End of Session - 05/06/21 1727     Visit Number 7    Number of Visits 9    Date for PT Re-Evaluation 05/19/21    PT Start Time 1600    PT Stop Time 1645    PT Time Calculation (min) 45 min    Activity Tolerance Patient tolerated treatment well    Behavior During Therapy [facility] for tasks assessed/performed             Past Medical History:  Diagnosis Date   Allergy    Anemia    present for many years, mild, stable   Anxiety    Breast cancer ([place])    Breast cancer ([place]) 12/18/2020   Dysrhythmia    PAC's on flecanide (did not tolerate beta blockers)   Family history of breast cancer 01/20/2021   Family history of prostate cancer 01/20/2021   Frequent headaches    HSV infection    fever blisters   Hypertension    Hypothyroidism     Past Surgical History:  Procedure Laterality Date   APPENDECTOMY  2013   BREAST LUMPECTOMY WITH RADIOACTIVE SEED LOCALIZATION Left 12/18/2020   Procedure: LEFT BREAST LUMPECTOMY WITH RADIOACTIVE SEED LOCALIZATION;  Surgeon: Erroll Luna, MD;  Location: [place];  Service: General;  Laterality: Left;   KNEE ARTHROSCOPY Right    lapband  2013   SENTINEL NODE BIOPSY Left 01/07/2021   Procedure: LEFT SENTINEL NODE BIOPSY;  Surgeon: Erroll Luna, MD;  Location: [place];  Service: General;  Laterality: Left;   TONSILLECTOMY  2012    There were no vitals filed for this visit.   Subjective Assessment - 05/06/21 1607     Subjective :Pt says that she had the pain in her left elbow so bad this morning she had to take an oxy.  The tylenol and advil did not help her. She says it feels "bubbly" She  says she is tried the exercises to stretch, but they did not help.    Pertinent History left beast cancer with lumpectomy and a second surgery with 2 nodes removed, Pt had 28 treatments of radiation, Pt said the she had some type of reaction after surgery and her left breast swelled severly    Patient Stated Goals to get rid of the pain    Currently in Pain? Yes    Pain Score --   pt says her pain is not so bad since she took the medicine, bu she did not rate   Pain Location Elbow    Pain Orientation Left    Pain Descriptors / Indicators Sharp    Pain Type Acute pain    Pain Onset In the past 7 days                               [facility] Adult PT Treatment/Exercise - 05/06/21 0001       Manual Therapy   Manual Therapy Manual Lymphatic Drainage (MLD);Soft tissue mobilization    Soft tissue mobilization in right sidelying with cocoa butter to tender areas at Lt medial scapular border and lateral trunk at areas of trigger points  Manual Lymphatic Drainage (MLD) In supine: Short neck, 5 diaphragmatic breaths, Lt inguinal and Rt axillary nodes, anterior inter-axillary (being mindful of healing blister at superior breast from radiation) and Lt axillo-inguinal anastomosis                         PT Long Term Goals - 04/17/21 1700       PT LONG TERM GOAL #1   Title Pt will report the pain in her left upper quadrant is reduced to 2/10 at the most    Baseline 5/10    Time 4    Period Weeks    Status New      PT LONG TERM GOAL #2   Title Pt will be independent in a home exericse program for left shoudler stretching and strengthening    Time 4    Period Weeks    Status New      PT LONG TERM GOAL #3   Title Pt will report she is knowledgable about lymphedem risk reduction practices    Time 4    Period Weeks    Status New                   Plan - 05/06/21 1727     Clinical Impression Statement Pt continues with sharp pain in left medial  elbow. It did not palpate as cording today. It is not warm, red or swollen, just tender.   Pt put a call in to Shona Simpson ( she was due to return Alison's call from yesterday) and pt has an appointment to see the surgeon Friday Worked on tight soft tissue today avoiding.tenderness at elbow.    Personal Factors and Comorbidities Comorbidity 3+    Comorbidities 2 surgeries on left breast, abnormal swelling reaction after second breast surgey, radiation    Examination-Activity Limitations Reach Overhead    Examination-Participation Restrictions Yard Work;Laundry;Cleaning;Driving;Meal Prep;Shop;Occupation    Stability/Clinical Decision Making Stable/Uncomplicated    Rehab Potential Excellent    PT Frequency 2x / week    PT Duration 4 weeks    PT Treatment/Interventions ADLs/Self Care Home Management;Functional mobility training;Therapeutic activities;Therapeutic exercise;Neuromuscular re-education;Manual techniques;Manual lymph drainage;Compression bandaging;Scar mobilization;Taping;Passive range of motion;Dry needling    PT Next Visit Plan How is cording (?)  at Lt antecubital fossa?Did pt see MLD?  Soft tissue work to left upper quadrant tight areas MLD to fullness in left upper arm and lateral chest  Instruct in postural exercises and cont pulleys and add ball roll up wall, progress to wall stretches and UE strength exercises    PT Home Exercise Plan supine wand flex, scaption, stargazer, standing lat stretch, wall slides    Consulted and Agree with Plan of Care Patient             Patient will benefit from skilled therapeutic intervention in order to improve the following deficits and impairments:  Decreased activity tolerance, Decreased knowledge of use of DME, Decreased safety awareness, Decreased strength, Increased fascial restricitons, Impaired UE functional use, Postural dysfunction, Pain, Obesity, Impaired perceived functional ability, Decreased range of motion, Increased muscle  spasms  Visit Diagnosis: Disorder of the skin and subcutaneous tissue related to radiation, unspecified  Stiffness of left shoulder, not elsewhere classified  Malignant neoplasm of upper-outer quadrant of left breast in female, estrogen receptor positive ([facility])  Muscle weakness (generalized)  Aftercare following surgery for neoplasm  Acute pain of left shoulder     Problem List Patient Active Problem  List   Diagnosis Date Noted   Prediabetes 03/27/2021   Class 2 severe obesity due to excess calories with serious comorbidity and body mass index (BMI) of 36.0 to 36.9 in adult ([place]) 03/27/2021   B12 deficiency anemia 01/19/2021   PVC (premature ventricular contraction) 01/17/2021   Iron deficiency anemia 01/17/2021   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 Owens Shark PT  Norwood Levo 05/06/2021, 5:33 PM  [place], [place], [address] Phone: [phone]   Fax:  [phone]  Name: [name] MRN: [id number] Date of Birth: May 11, 1971

## 2021-05-13 ENCOUNTER — Ambulatory Visit: Payer: BC Managed Care – PPO

## 2021-05-13 ENCOUNTER — Inpatient Hospital Stay (HOSPITAL_BASED_OUTPATIENT_CLINIC_OR_DEPARTMENT_OTHER): Payer: BC Managed Care – PPO | Admitting: Nurse Practitioner

## 2021-05-13 ENCOUNTER — Inpatient Hospital Stay: Payer: BC Managed Care – PPO

## 2021-05-13 ENCOUNTER — Inpatient Hospital Stay: Payer: BC Managed Care – PPO | Attending: Hematology

## 2021-05-13 ENCOUNTER — Other Ambulatory Visit: Payer: Self-pay

## 2021-05-13 ENCOUNTER — Telehealth: Payer: Self-pay | Admitting: Radiation Oncology

## 2021-05-13 ENCOUNTER — Encounter: Payer: Self-pay | Admitting: Nurse Practitioner

## 2021-05-13 VITALS — BP 133/94 | HR 93 | Temp 97.6°F | Resp 18 | Wt 217.0 lb

## 2021-05-13 DIAGNOSIS — D509 Iron deficiency anemia, unspecified: Secondary | ICD-10-CM

## 2021-05-13 DIAGNOSIS — Z170 Estrogen receptor positive status [ER+]: Secondary | ICD-10-CM

## 2021-05-13 DIAGNOSIS — Z923 Personal history of irradiation: Secondary | ICD-10-CM | POA: Insufficient documentation

## 2021-05-13 DIAGNOSIS — D519 Vitamin B12 deficiency anemia, unspecified: Secondary | ICD-10-CM

## 2021-05-13 DIAGNOSIS — N644 Mastodynia: Secondary | ICD-10-CM | POA: Insufficient documentation

## 2021-05-13 DIAGNOSIS — C50412 Malignant neoplasm of upper-outer quadrant of left female breast: Secondary | ICD-10-CM | POA: Diagnosis present

## 2021-05-13 DIAGNOSIS — E538 Deficiency of other specified B group vitamins: Secondary | ICD-10-CM | POA: Insufficient documentation

## 2021-05-13 DIAGNOSIS — I890 Lymphedema, not elsewhere classified: Secondary | ICD-10-CM | POA: Insufficient documentation

## 2021-05-13 DIAGNOSIS — Z1211 Encounter for screening for malignant neoplasm of colon: Secondary | ICD-10-CM | POA: Diagnosis not present

## 2021-05-13 LAB — CBC WITH DIFFERENTIAL (CANCER CENTER ONLY)
Abs Immature Granulocytes: 0.01 10*3/uL (ref 0.00–0.07)
Basophils Absolute: 0 10*3/uL (ref 0.0–0.1)
Basophils Relative: 1 %
Eosinophils Absolute: 0.1 10*3/uL (ref 0.0–0.5)
Eosinophils Relative: 2 %
HCT: 34.4 % — ABNORMAL LOW (ref 36.0–46.0)
Hemoglobin: 12.2 g/dL (ref 12.0–15.0)
Immature Granulocytes: 0 %
Lymphocytes Relative: 29 %
Lymphs Abs: 0.9 10*3/uL (ref 0.7–4.0)
MCH: 29.3 pg (ref 26.0–34.0)
MCHC: 35.5 g/dL (ref 30.0–36.0)
MCV: 82.5 fL (ref 80.0–100.0)
Monocytes Absolute: 0.4 10*3/uL (ref 0.1–1.0)
Monocytes Relative: 11 %
Neutro Abs: 1.9 10*3/uL (ref 1.7–7.7)
Neutrophils Relative %: 57 %
Platelet Count: 197 10*3/uL (ref 150–400)
RBC: 4.17 MIL/uL (ref 3.87–5.11)
RDW: 14.9 % (ref 11.5–15.5)
WBC Count: 3.2 10*3/uL — ABNORMAL LOW (ref 4.0–10.5)
nRBC: 0 % (ref 0.0–0.2)

## 2021-05-13 LAB — CMP (CANCER CENTER ONLY)
ALT: 14 U/L (ref 0–44)
AST: 16 U/L (ref 15–41)
Albumin: 3.7 g/dL (ref 3.5–5.0)
Alkaline Phosphatase: 107 U/L (ref 38–126)
Anion gap: 12 (ref 5–15)
BUN: 10 mg/dL (ref 6–20)
CO2: 25 mmol/L (ref 22–32)
Calcium: 9.4 mg/dL (ref 8.9–10.3)
Chloride: 102 mmol/L (ref 98–111)
Creatinine: 1.1 mg/dL — ABNORMAL HIGH (ref 0.44–1.00)
GFR, Estimated: >60 mL/min (ref >60)
Glucose, Bld: 93 mg/dL (ref 70–99)
Potassium: 3.2 mmol/L — ABNORMAL LOW (ref 3.5–5.1)
Sodium: 139 mmol/L (ref 135–145)
Total Bilirubin: 0.4 mg/dL (ref 0.3–1.2)
Total Protein: 6.9 g/dL (ref 6.5–8.1)

## 2021-05-13 LAB — VITAMIN B12: Vitamin B-12: 399 pg/mL (ref 180–914)

## 2021-05-13 LAB — FERRITIN: Ferritin: 96 ng/mL (ref 11–307)

## 2021-05-13 MED ORDER — CYANOCOBALAMIN 1000 MCG/ML IJ SOLN
1000.0000 ug | Freq: Once | INTRAMUSCULAR | Status: AC
  Administered 2021-05-13: 1000 ug via INTRAMUSCULAR
  Filled 2021-05-13: qty 1

## 2021-05-13 NOTE — Telephone Encounter (Signed)
I tried returning the patient's call but acknowledged that I had reviewed her notes from her survivorship visit.

## 2021-05-13 NOTE — Progress Notes (Signed)
CLINIC:  Survivorship   Patient Care Team: Jearld Fenton, NP as PCP - General (Internal Medicine) Minna Merritts, MD as PCP - Cardiology (Cardiology) Erroll Luna, MD as Consulting Physician (General Surgery) Rockwell Germany, RN as Oncology Nurse Navigator Mauro Kaufmann, RN as Oncology Nurse Navigator Truitt Merle, MD as Consulting Physician (Hematology) Alla Feeling, NP as Nurse Practitioner (Nurse Practitioner) Kyung Rudd, MD as Consulting Physician (Radiation Oncology)   REASON FOR VISIT:  Routine follow-up post-treatment for a recent history of breast cancer.  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  IMPRESSION: Indeterminate left breast mass at 2 o'clock 7 cm from the nipple. Targeted ultrasound of the left axilla demonstrates normal lymph nodes.   10/31/2020 Pathology Results   Breast, left, needle core biopsy, 2 o'clock - FIBROADIPOSE TISSUE WITH MINIMAL BREAST PARENCHYMA DEMONSTRATING MILD FIBROCYSTIC CHANGE TO INCLUDE APOCRINE METAPLASIA.  This was found to be discordant.   12/18/2020 Surgery   A. BREAST, LEFT, LUMPECTOMY:  - Invasive lobular carcinoma, 1.1 cm, grade 2.  See comment  - Resection margins are negative for carcinoma; inferior margin is  focally less than 1 mm from carcinoma and medial margin is approximately  1 mm from carcinoma  - Biopsy  related changes  - Background fibrocystic change   PROGNOSTIC INDICATOR RESULTS:  - The tumor cells are NEGATIVE for Her2 (0).  - Estrogen Receptor:       POSITIVE, 30%, WEAK STAINING  - Progesterone Receptor:   POSITIVE, 80%, MODERATE STAINING  - Proliferation Marker Ki-67:   <5%    12/18/2020 Oncotype testing   Oncotype  Recurrence Score 16  Distant recurrence risk at 9 years of 4% with AI or Tamoxifen alone.  Less than 1% benefit of chemotherapy.    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 LYMPH NODE, LEFT, SENTINEL, EXCISION:  - One lymph node negative for metastatic carcinoma (0/1).   B. LYMPH NODE, LEFT, SENTINEL, EXCISION:  - One lymph node negative for metastatic carcinoma (0/1).   COMMENT:  Immunohistochemistry for cytokeratin AE1/AE3 is performed on parts A and  B and no metastatic carcinoma is identified   02/05/2021 - 03/19/2021 Radiation Therapy   Adjuvant Radiation with Dr Lisbeth Renshaw    05/13/2021 -  Anti-estrogen oral therapy   Tamoxifen 66m once daily starting in September 2022   05/13/2021 Survivorship   SCP delivered by LCira Rue NP     INTERVAL HISTORY:  Ms. SHarnerpresents to the  Survivorship Clinic today for our initial meeting to review her survivorship care plan detailing her treatment course for breast cancer, as well as monitoring long-term side effects of that treatment, education regarding health maintenance, screening, and overall  wellness and health promotion.     Overall, Ms. Juma reports feeling well in general since completing her radiation therapy.  She has some lymphedema and muscle aches with tenderness in the left breast.  She is left-handed.  She is seeing PT.  Skin is healing from radiation.  She is tired and does not sleep well but remains functional, she is a Pharmacist, hospital of food/nutrition.  She has not started tamoxifen yet.  She is concerned about changing her birth control to IUD and wants to know if she can take progesterone only.   REVIEW OF SYSTEMS:  Review of Systems - Oncology Breast: Denies any new nodularity, masses, tenderness, nipple changes, or nipple discharge.      ONCOLOGY TREATMENT TEAM:  1. Surgeon:  Dr. Br[facility]ley Stage at [facility] 2. Medical Oncologist: Dr. Burr Medico 3. Radiation Oncologist: Dr. Lisbeth Renshaw    PAST MEDICAL/SURGICAL HISTORY:  Past Medical History:  Diagnosis Date   Allergy    Anemia    present for many years, mild, stable   Anxiety    Breast cancer ([place])    Breast cancer ([place]) 12/18/2020   Dysrhythmia    PAC's on flecanide (did not tolerate beta blockers)   Family history of breast cancer 01/20/2021   Family history of prostate cancer 01/20/2021   Frequent headaches    HSV infection    fever blisters   Hypertension    Hypothyroidism    Past Surgical History:  Procedure Laterality Date   APPENDECTOMY  2013   BREAST LUMPECTOMY WITH RADIOACTIVE SEED LOCALIZATION Left 12/18/2020   Procedure: LEFT BREAST LUMPECTOMY WITH RADIOACTIVE SEED LOCALIZATION;  Surgeon: Erroll Luna, MD;  Location: [facility];  Service: General;  Laterality: Left;   KNEE ARTHROSCOPY Right    lapband  2013   SENTINEL NODE BIOPSY Left 01/07/2021   Procedure: LEFT SENTINEL NODE BIOPSY;  Surgeon: Erroll Luna, MD;  Location: [facility];  Service: General;  Laterality: Left;   TONSILLECTOMY  2012     ALLERGIES:  Allergies  Allergen Reactions   Ciprofloxacin     anxiety    Keflex [Cephalexin] Hives   Trazodone And Nefazodone Other (See Comments)    nightmares     CURRENT MEDICATIONS:  Outpatient Encounter Medications as of 05/13/2021  Medication Sig   ALPRAZolam (XANAX) 0.25 MG tablet Take 1 tablet (0.25 mg total) by mouth 2 (two) times daily as needed.   atorvastatin (LIPITOR) 10 MG tablet Take 1 tablet (10 mg total) by mouth daily.   butalbital-acetaminophen-caffeine (FIORICET) 50-325-40 MG tablet TAKE 1 TABLET BY MOUTH EVERY 6 (SIX) HOURS AS NEEDED FOR HEADACHE.   cyclobenzaprine (FLEXERIL) 10 MG tablet Take 1 tablet (10 mg total) by mouth daily as needed (migraines).   flecainide (TAMBOCOR) 50 MG tablet TAKE 1 TABLET BY MOUTH TWICE A DAY (Patient taking differently: Take 50 mg by mouth 2 (two) times daily.)   FLUoxetine (PROZAC) 10 MG capsule TAKE 1 CAPSULE BY MOUTH EVERY DAY   fluticasone (FLONASE) 50 MCG/ACT nasal spray SPRAY 2 SPRAYS INTO EACH NOSTRIL EVERY DAY (Patient taking differently: Place 2 sprays into both nostrils daily as needed for allergies.)   gabapentin (NEURONTIN) 100 MG capsule Take 1 capsule (100 mg total) by mouth 3 (three) times daily.   ibuprofen (ADVIL)  800 MG tablet Take 1 tablet (800 mg total) by mouth every 8 (eight) hours as needed.   levocetirizine (XYZAL) 5 MG tablet TAKE 1 TABLET BY MOUTH EVERY DAY IN THE EVENING   levothyroxine (SYNTHROID) 112 MCG tablet Take 1 tablet (112 mcg total) by mouth daily before breakfast.   losartan (COZAAR) 25 MG tablet TAKE 1 TABLET BY MOUTH EVERY DAY (Patient taking differently: Take 25 mg by mouth at bedtime.)   oxyCODONE (OXY IR/ROXICODONE) 5 MG immediate release tablet Take 1 tablet (5 mg total) by mouth every 6 (six) hours as needed for severe pain.   Prenatal Vit-Fe Fumarate-FA (PRENATAL MULTIVITAMIN) TABS tablet Take 1 tablet by mouth at bedtime.   rizatriptan (MAXALT-MLT) 10 MG disintegrating tablet Take 1 tablet (10 mg total) by mouth as needed for migraine. May repeat in 2 hours if  needed   SUMAtriptan (IMITREX) 25 MG tablet TAKE 1 TABLET (25 MG TOTAL) BY MOUTH EVERY 2 (TWO) HOURS AS NEEDED FOR MIGRAINE. MAY REPEAT IN 2 HOURS IF HEADACHE PERSISTS OR RECURS.   tamoxifen (NOLVADEX) 20 MG tablet Take 1 tablet (20 mg total) by mouth daily.   valACYclovir (VALTREX) 500 MG tablet TAKE 1 TABLET (500 MG TOTAL) BY MOUTH DAILY AS NEEDED (HERPES SIMPLEX).   No facility-administered encounter medications on file as of 05/13/2021.     ONCOLOGIC FAMILY HISTORY:  Family History  Problem Relation Age of Onset   Depression Mother    Breast cancer Mother 8   Hyperlipidemia Father    Prostate cancer Father 98     GENETIC COUNSELING/TESTING: Yes, - 01/2021  SOCIAL HISTORY:  Layloni Fahrner lives in [place], [place].  She is currently working as a Pharmacist, hospital.  She denies any current or history of tobacco, alcohol, or illicit drug use.     PHYSICAL EXAMINATION:  Vital Signs:   Vitals:   05/13/21 0954  BP: (!) 133/94  Pulse: 93  Resp: 18  Temp: 97.6 F (36.4 C)  SpO2: 98%   Filed Weights   05/13/21 0954  Weight: 217 lb (98.4 kg)   General: Well-nourished, well-appearing female in no acute distress.   HEENT: Sclerae anicteric.  Lymph: No cervical, supraclavicular, or infraclavicular lymphadenopathy noted on palpation.  Respiratory: breathing non-labored.  Neuro: No focal deficits. Steady gait.  Psych: Mood and affect normal and appropriate for situation.  Extremities: No edema. MSK: No focal spinal tenderness to palpation.  Full range of motion in bilateral upper extremities Skin: Warm and dry. Breast exam: Breasts are symmetric without nipple discharge or inversion.  S/p left lumpectomy and multiple biopsies, incisions completely healed with scar tissue.  There is a TTP soft tissue density at the left outer breast, likely postop seroma.  No other palpable mass or nodularity in either breast or axilla that I could appreciate.  LABORATORY DATA:  None for this  visit.  DIAGNOSTIC IMAGING:  None for this visit.      ASSESSMENT AND PLAN:  Ms.. [name] is a pleasant 50 y.o. 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  She presents to the Survivorship Clinic for our initial meeting and routine follow-up post-completion of treatment for breast cancer.    1. Stage 1A left breast cancer:  Ms. Guyett is continuing to recover from definitive treatment for breast cancer. She will continue PT and follow-up with her medical oncologist,  Dr. Burr Medico in 08/2021 with history and physical exam per surveillance protocol.  She will begin her anti-estrogen therapy with tamoxifen. She was instructed to make Dr. Burr Medico or myself aware if she begins to experience any side effects of the medication and I could see her back in clinic to help manage those side effects, as needed. Though the incidence is low, there is an associated risk of endometrial cancer with anti-estrogen therapies like Tamoxifen.  Ms. Trower was encouraged to contact Dr. Burr Medico or myself with any vaginal bleeding while taking Tamoxifen.  She is also followed closely by OB/GYN Dr. Matthew Saras.  Other side effects of Tamoxifen were again reviewed with her as well. Today, a comprehensive survivorship care plan and treatment summary was reviewed with the patient today detailing her breast cancer diagnosis, treatment course, potential late/long-term effects of treatment, appropriate follow-up care with recommendations for the future, and patient education resources.  A copy of this summary, along with a letter will be sent to the patient's primary care provider via In Basket message after today's visit.    2.  Left breast pain and lymphedema: She will continue physical therapy.  Exam shows a palpable density with  tenderness in the left outer breast, likely postop seroma.  I recommend ultrasound to confirm.  We discussed referral back to Dr. Brantley Stage to discuss draining the seroma.  We will also proceed with breast MRI for screening purposes.  3. Bone health:  Given Ms. Hartsell's age and premenopausal status, and current treatment with tamoxifen, she does not need to proceed with DEXA screening at this time.  In the meantime, she was encouraged to increase her consumption of foods rich in calcium, as well as increase her weight-bearing activities.  She was given education on specific activities to promote bone health.  4. Cancer screening:  Due to Ms. Hamric's history and her age, she should receive screening for skin cancers, colon cancer, and gynecologic cancers.  She is overdue for colonoscopy, I referred her to the lower GI.  The information and recommendations are listed on the patient's comprehensive care plan/treatment summary and were reviewed in detail with the patient.    5. Health maintenance and wellness promotion: Ms. Randhawa was encouraged to consume 5-7 servings of fruits and vegetables per day. We reviewed the "Nutrition Rainbow" handout.  She was also encouraged to engage in moderate to vigorous exercise for 30 minutes per day most days of the week. We discussed the [employer] fitness program, which is designed for cancer survivors to help them become more physically fit after cancer treatments.  She was instructed to limit her alcohol consumption and continue to abstain from tobacco use.   6. Support services/counseling: It is not uncommon for this period of the patient's cancer care trajectory to be one of many emotions and stressors.  We discussed an opportunity for her to participate in the next session of [facility] ("Finding Your New Normal") support group series designed for patients after they have completed treatment.   Ms. Arico was encouraged to take advantage of our many  other support services programs, support groups, and/or counseling in coping with her new life as a cancer survivor after completing anti-cancer treatment.  She was offered support today through active listening and expressive supportive counseling.  She was given information regarding our available services and encouraged to contact me with any questions or for help enrolling in any of our support group/programs.   7. OCP: I reviewed with pt and confirmed with  Dr. Burr Medico to avoid hormone based contraception given ER/PR + breast cancer. Please f/up with Dr. Matthew Saras for alternative options.    Dispo:   -Left breast ultrasound in the next week  -OK to extend LE PT, will cc note to Shan Levans  -B12 inj monthly x3 -Screening breast MRI 06/2021  -return to cancer center 08/2021 -Mammogram due in 12/2021 -Follow up with surgery as scheduled -She is welcome to return back to the Survivorship Clinic at any time; no additional follow-up needed at this time.  -Consider referral back to survivorship as a long-term survivor for continued surveillance  Orders Placed This Encounter  Procedures   US BREAST LTD UNI LEFT INC AXILLA    Standing Status:   Future    Standing Expiration Date:   05/13/2022    Order Specific Question:   Reason for Exam (SYMPTOM  OR DIAGNOSIS REQUIRED)    Answer:   h/o left breast cancer 12/2020, tender/firm outer breast exam, likely post op seroma    Order Specific Question:   Preferred imaging location?    Answer:   [facility]   MR BREAST W & WO CM SCREENING (GI)    Standing Status:   Future    Standing Expiration Date:   05/13/2022    Order Specific Question:   If indicated for the ordered procedure, I authorize the administration of contrast media per Radiology protocol    Answer:   Yes    Order Specific Question:   Is the patient pregnant?    Answer:   No    Order Specific Question:   Is the patient's LMP greater than 28 days?    Answer:   Yes    Order Specific  Question:   What is the patient's sedation requirement?    Answer:   No Sedation    Order Specific Question:   Does the patient have a pacemaker or implanted devices?    Answer:   No    Order Specific Question:   Preferred imaging location?    Answer:   [facility] (table limit-550lbs)   Ambulatory referral to Gastroenterology    Referral Priority:   Routine    Referral Type:   Consultation    Referral Reason:   Specialty Services Required    Number of Visits Requested:   1     A total of (40) minutes of face-to-face time was spent with this patient with greater than 50% of that time in counseling and care-coordination.   Cira Rue, NP Survivorship Program [facility] [phone]   Note: PRIMARY CARE PROVIDER Jearld Fenton, [place] [phone]

## 2021-05-14 ENCOUNTER — Telehealth: Payer: Self-pay | Admitting: Nurse Practitioner

## 2021-05-14 ENCOUNTER — Ambulatory Visit: Payer: BC Managed Care – PPO | Admitting: Rehabilitation

## 2021-05-14 ENCOUNTER — Encounter: Payer: Self-pay | Admitting: Nurse Practitioner

## 2021-05-14 NOTE — Telephone Encounter (Signed)
This appears to be a Dr. Burr Medico patient. Please contact her regarding the care of this patient.

## 2021-05-14 NOTE — Telephone Encounter (Signed)
Left message with follow-up appointments per 9/6 los. 

## 2021-05-16 ENCOUNTER — Other Ambulatory Visit: Payer: Self-pay | Admitting: Family

## 2021-05-19 ENCOUNTER — Other Ambulatory Visit: Payer: Self-pay | Admitting: Nurse Practitioner

## 2021-05-19 ENCOUNTER — Other Ambulatory Visit: Payer: Self-pay

## 2021-05-19 ENCOUNTER — Ambulatory Visit
Admission: RE | Admit: 2021-05-19 | Discharge: 2021-05-19 | Disposition: A | Discharge status: Home / Self Care | Payer: BC Managed Care – PPO | Source: Ambulatory Visit | Attending: Nurse Practitioner | Admitting: Nurse Practitioner

## 2021-05-19 DIAGNOSIS — N6489 Other specified disorders of breast: Secondary | ICD-10-CM

## 2021-05-19 DIAGNOSIS — N644 Mastodynia: Secondary | ICD-10-CM

## 2021-05-20 ENCOUNTER — Other Ambulatory Visit: Payer: Self-pay | Admitting: Nurse Practitioner

## 2021-05-20 DIAGNOSIS — Z170 Estrogen receptor positive status [ER+]: Secondary | ICD-10-CM

## 2021-05-20 DIAGNOSIS — C50412 Malignant neoplasm of upper-outer quadrant of left female breast: Secondary | ICD-10-CM

## 2021-05-20 MED ORDER — GABAPENTIN 100 MG PO CAPS: 100.0000 mg | ORAL_CAPSULE | Freq: Three times a day (TID) | ORAL | 0 refills | Status: DC

## 2021-05-26 ENCOUNTER — Ambulatory Visit: Payer: BC Managed Care – PPO | Attending: Radiation Oncology | Admitting: Rehabilitation

## 2021-05-26 ENCOUNTER — Other Ambulatory Visit: Payer: Self-pay

## 2021-05-26 ENCOUNTER — Encounter: Payer: Self-pay | Admitting: Rehabilitation

## 2021-05-26 DIAGNOSIS — Z170 Estrogen receptor positive status [ER+]: Secondary | ICD-10-CM | POA: Diagnosis present

## 2021-05-26 DIAGNOSIS — M6281 Muscle weakness (generalized): Secondary | ICD-10-CM

## 2021-05-26 DIAGNOSIS — L599 Disorder of the skin and subcutaneous tissue related to radiation, unspecified: Secondary | ICD-10-CM

## 2021-05-26 DIAGNOSIS — M25512 Pain in left shoulder: Secondary | ICD-10-CM

## 2021-05-26 DIAGNOSIS — M25612 Stiffness of left shoulder, not elsewhere classified: Secondary | ICD-10-CM

## 2021-05-26 DIAGNOSIS — Z483 Aftercare following surgery for neoplasm: Secondary | ICD-10-CM | POA: Diagnosis present

## 2021-05-26 DIAGNOSIS — C50412 Malignant neoplasm of upper-outer quadrant of left female breast: Secondary | ICD-10-CM

## 2021-05-26 NOTE — Therapy (Signed)
[place], [place], [address] Phone: [phone]   Fax:  [phone]  Physical Therapy Treatment  Patient Details  Name: [name] MRN: [id number] Date of Birth: [birth year] Referring Provider (PT): Shona Simpson   Encounter Date: 05/26/2021   PT End of Session - 05/26/21 0844     Visit Number 8    Number of Visits 14    Date for PT Re-Evaluation 06/23/21    PT Start Time 0800    PT Stop Time 0845    PT Time Calculation (min) 45 min    Activity Tolerance Patient tolerated treatment well    Behavior During Therapy [facility] for tasks assessed/performed             Past Medical History:  Diagnosis Date   Allergy    Anemia    present for many years, mild, stable   Anxiety    Breast cancer ([place])    Breast cancer ([place]) 12/18/2020   Dysrhythmia    PAC's on flecanide (did not tolerate beta blockers)   Family history of breast cancer 01/20/2021   Family history of prostate cancer 01/20/2021   Frequent headaches    HSV infection    fever blisters   Hypertension    Hypothyroidism     Past Surgical History:  Procedure Laterality Date   APPENDECTOMY  2013   BREAST LUMPECTOMY WITH RADIOACTIVE SEED LOCALIZATION Left 12/18/2020   Procedure: LEFT BREAST LUMPECTOMY WITH RADIOACTIVE SEED LOCALIZATION;  Surgeon: Erroll Luna, MD;  Location: [facility];  Service: General;  Laterality: Left;   KNEE ARTHROSCOPY Right    lapband  2013   SENTINEL NODE BIOPSY Left 01/07/2021   Procedure: LEFT SENTINEL NODE BIOPSY;  Surgeon: Erroll Luna, MD;  Location: [facility];  Service: General;  Laterality: Left;   TONSILLECTOMY  2012    There were no vitals filed for this visit.   Subjective Assessment - 05/26/21 0755     Subjective Nothing new.  They did find a small seroma but not going to drain it.  Will have an MRI.  Still having alot of pain across the left anterior chest and into the axilla.     Pertinent History left beast cancer with lumpectomy and a second surgery with 2 nodes removed, Pt had 28 treatments of radiation, Pt said the she had some type of reaction after surgery and her left breast swelled severly.  The elbow pain is there but not as bad.  It occurs when I use my arm more.    Currently in Pain? No/denies                               [facility] Adult PT Treatment/Exercise - 05/26/21 0001       Manual Therapy   Manual therapy comments gave pt script for sleeve for a special place    Soft tissue mobilization to the left pectoralis, anterior shoulder, and upper arm, upper trap    Myofascial Release to the Lt antecubital fossa    Manual Lymphatic Drainage (MLD) In supine: Short neck, 5 diaphragmatic breaths, Lt inguinal and Rt axillary nodes, anterior inter-axillary (being mindful of healing blister at superior breast from radiation) and Lt axillo-inguinal anastomosis    Passive ROM To Lt shoulder into abduction during MFR  PT Long Term Goals - 04/17/21 1700       PT LONG TERM GOAL #1   Title Pt will report the pain in her left upper quadrant is reduced to 2/10 at the most    Baseline 5/10    Time 4    Period Weeks    Status New      PT LONG TERM GOAL #2   Title Pt will be independent in a home exericse program for left shoudler stretching and strengthening    Time 4    Period Weeks    Status New      PT LONG TERM GOAL #3   Title Pt will report she is knowledgable about lymphedem risk reduction practices    Time 4    Period Weeks    Status New                   Plan - 05/26/21 0846     Clinical Impression Statement Left elbow pain has greatly improved.  Continued deficits include left upper quadrant tightness, upper arm and lateral trunk tightness, seroma in the left breast which will be monitored. Extended POC today to continue visits.  Left breast is now well healed.    PT Frequency  2x / week    PT Duration 4 weeks    PT Treatment/Interventions ADLs/Self Care Home Management;Functional mobility training;Therapeutic activities;Therapeutic exercise;Neuromuscular re-education;Manual techniques;Manual lymph drainage;Compression bandaging;Scar mobilization;Taping;Passive range of motion;Dry needling    PT Next Visit Plan Soft tissue work to left upper quadrant tight areas MLD to fullness in left upper arm and lateral chest  Instruct in postural exercises and cont pulleys and add ball roll up wall    Consulted and Agree with Plan of Care Patient             Patient will benefit from skilled therapeutic intervention in order to improve the following deficits and impairments:  Decreased activity tolerance, Decreased knowledge of use of DME, Decreased safety awareness, Decreased strength, Increased fascial restricitons, Impaired UE functional use, Postural dysfunction, Pain, Obesity, Impaired perceived functional ability, Decreased range of motion, Increased muscle spasms  Visit Diagnosis: Disorder of the skin and subcutaneous tissue related to radiation, unspecified  Stiffness of left shoulder, not elsewhere classified  Malignant neoplasm of upper-outer quadrant of left breast in female, estrogen receptor positive ([facility])  Muscle weakness (generalized)  Acute pain of left shoulder  Aftercare following surgery for neoplasm     Problem List Patient Active Problem List   Diagnosis Date Noted   Prediabetes 03/27/2021   Class 2 severe obesity due to excess calories with serious comorbidity and body mass index (BMI) of 36.0 to 36.9 in adult ([place]) 03/27/2021   B12 deficiency anemia 01/19/2021   PVC (premature ventricular contraction) 01/17/2021   Iron deficiency anemia 01/17/2021   Carcinoma of upper-outer quadrant of left breast, estrogen receptor positive ([place]) 01/02/2021   HLD (hyperlipidemia) 08/19/2019   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

## 2021-05-28 ENCOUNTER — Ambulatory Visit: Payer: BC Managed Care – PPO

## 2021-05-28 ENCOUNTER — Encounter: Payer: Self-pay | Admitting: Internal Medicine

## 2021-05-28 ENCOUNTER — Other Ambulatory Visit: Payer: Self-pay

## 2021-05-28 DIAGNOSIS — M25612 Stiffness of left shoulder, not elsewhere classified: Secondary | ICD-10-CM

## 2021-05-28 DIAGNOSIS — Z483 Aftercare following surgery for neoplasm: Secondary | ICD-10-CM

## 2021-05-28 DIAGNOSIS — Z170 Estrogen receptor positive status [ER+]: Secondary | ICD-10-CM

## 2021-05-28 DIAGNOSIS — M25512 Pain in left shoulder: Secondary | ICD-10-CM

## 2021-05-28 DIAGNOSIS — L599 Disorder of the skin and subcutaneous tissue related to radiation, unspecified: Secondary | ICD-10-CM

## 2021-05-28 DIAGNOSIS — M6281 Muscle weakness (generalized): Secondary | ICD-10-CM

## 2021-05-28 DIAGNOSIS — C50412 Malignant neoplasm of upper-outer quadrant of left female breast: Secondary | ICD-10-CM

## 2021-05-28 NOTE — Therapy (Signed)
[place], [place], [address] Phone: [phone]   Fax:  [phone]  Physical Therapy Treatment  Patient Details  Name: [name] MRN: [id number] Date of Birth: 15-Mar-1971 Referring Provider (PT): Shona Simpson   Encounter Date: 05/28/2021   PT End of Session - 05/28/21 1700     Visit Number 9    Number of Visits 14    Date for PT Re-Evaluation 06/23/21    PT Start Time 1604    PT Stop Time 1655    PT Time Calculation (min) 51 min    Activity Tolerance Patient tolerated treatment well    Behavior During Therapy [facility] for tasks assessed/performed             Past Medical History:  Diagnosis Date   Allergy    Anemia    present for many years, mild, stable   Anxiety    Breast cancer ([place])    Breast cancer ([place]) 12/18/2020   Dysrhythmia    PAC's on flecanide (did not tolerate beta blockers)   Family history of breast cancer 01/20/2021   Family history of prostate cancer 01/20/2021   Frequent headaches    HSV infection    fever blisters   Hypertension    Hypothyroidism     Past Surgical History:  Procedure Laterality Date   APPENDECTOMY  2013   BREAST LUMPECTOMY WITH RADIOACTIVE SEED LOCALIZATION Left 12/18/2020   Procedure: LEFT BREAST LUMPECTOMY WITH RADIOACTIVE SEED LOCALIZATION;  Surgeon: Erroll Luna, MD;  Location: [place];  Service: General;  Laterality: Left;   KNEE ARTHROSCOPY Right    lapband  2013   SENTINEL NODE BIOPSY Left 01/07/2021   Procedure: LEFT SENTINEL NODE BIOPSY;  Surgeon: Erroll Luna, MD;  Location: [place];  Service: General;  Laterality: Left;   TONSILLECTOMY  2012    There were no vitals filed for this visit.   Subjective Assessment - 05/28/21 1602     Subjective I haven't been able to go get measured for the sleeve yet. Still have the cording at the axilla and around the elbow, and it sore along the lateral trunk.  The MLD  helped that area. they are going to wait until the MRI to see the area in my breast where the seroma is before attempting to drain it.    Pertinent History left beast cancer with lumpectomy and a second surgery with 2 nodes removed, Pt had 28 treatments of radiation, Pt said the she had some type of reaction after surgery and her left breast swelled severly.  The elbow pain is there but not as bad.  It occurs when I use my arm more.    Currently in Pain? Yes    Pain Score 2     Pain Location Elbow    Pain Orientation Left    Pain Descriptors / Indicators Sharp    Pain Type Acute pain    Pain Onset More than a month ago    Pain Frequency Intermittent                               [facility] Adult PT Treatment/Exercise - 05/28/21 0001       Manual Therapy   Manual therapy comments gave pt another script for sleeve because she lost hers.  Also told her about [facility] in [place].    Soft tissue mobilization Left lats, shoulder, medial and lateral  arm    Myofascial Release to the Lt antecubital fossa and upper arm to decrease cording    Manual Lymphatic Drainage (MLD) In supine: Short neck, 5 diaphragmatic breaths, Lt inguinal and Rt axillary nodes, anterior inter-axillary (being mindful of healing blister at superior breast from radiation) and Lt axillo-inguinal anastomosis and in SL to left Posterior interaxillary anastomosis and left axillo-inguinal anastomosis    Passive ROM to left shoulder flexion, scaption, abd with MFR                          PT Long Term Goals - 04/17/21 1700       PT LONG TERM GOAL #1   Title Pt will report the pain in her left upper quadrant is reduced to 2/10 at the most    Baseline 5/10    Time 4    Period Weeks    Status New      PT LONG TERM GOAL #2   Title Pt will be independent in a home exericse program for left shoudler stretching and strengthening    Time 4    Period Weeks    Status New      PT LONG TERM GOAL  #3   Title Pt will report she is knowledgable about lymphedem risk reduction practices    Time 4    Period Weeks    Status New                   Plan - 05/28/21 1701     Clinical Impression Statement Pt continues with a cord at left antecubital fossa that sends shocks up her arm. Pain was improved after release techniques and STM.  PROM to left shoulder throughout was uncomfortable at all end ranges.  She had tightness/tenderness at lats and scapular area.  Seroma in the breast being monitored by MD. Pt will look into getting sleeve at [facility] in [place] Factors and Comorbidities Comorbidity 3+    Comorbidities 2 surgeries on left breast, abnormal swelling reaction after second breast surgey, radiation    Examination-Activity Limitations Reach Overhead    Examination-Participation Restrictions Yard Work;Laundry;Cleaning;Driving;Meal Prep;Shop;Occupation    Stability/Clinical Decision Making Stable/Uncomplicated    Rehab Potential Excellent    PT Frequency 2x / week    PT Duration 4 weeks    PT Treatment/Interventions ADLs/Self Care Home Management;Functional mobility training;Therapeutic activities;Therapeutic exercise;Neuromuscular re-education;Manual techniques;Manual lymph drainage;Compression bandaging;Scar mobilization;Taping;Passive range of motion;Dry needling    PT Next Visit Plan Soft tissue work to left upper quadrant tight areas MLD to fullness in left upper arm and lateral chest  Instruct in postural exercises and cont pulleys and add ball roll up wall, supine half foam roll    PT Home Exercise Plan supine wand flex, scaption, stargazer, standing lat stretch, wall slides    Consulted and Agree with Plan of Care Patient             Patient will benefit from skilled therapeutic intervention in order to improve the following deficits and impairments:  Decreased activity tolerance, Decreased knowledge of use of DME, Decreased safety awareness, Decreased  strength, Increased fascial restricitons, Impaired UE functional use, Postural dysfunction, Pain, Obesity, Impaired perceived functional ability, Decreased range of motion, Increased muscle spasms  Visit Diagnosis: Disorder of the skin and subcutaneous tissue related to radiation, unspecified  Stiffness of left shoulder, not elsewhere classified  Malignant neoplasm of upper-outer quadrant of left breast in female, estrogen  receptor positive ([place])  Muscle weakness (generalized)  Acute pain of left shoulder  Aftercare following surgery for neoplasm     Problem List Patient Active Problem List   Diagnosis Date Noted   Prediabetes 03/27/2021   Class 2 severe obesity due to excess calories with serious comorbidity and body mass index (BMI) of 36.0 to 36.9 in adult ([place]) 03/27/2021   B12 deficiency anemia 01/19/2021   PVC (premature ventricular contraction) 01/17/2021   Iron deficiency anemia 01/17/2021   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

## 2021-05-29 ENCOUNTER — Other Ambulatory Visit: Payer: Self-pay

## 2021-05-29 MED ORDER — ALPRAZOLAM 0.25 MG PO TABS: 0.2500 mg | ORAL_TABLET | Freq: Two times a day (BID) | ORAL | 0 refills | Status: DC | PRN

## 2021-06-01 ENCOUNTER — Other Ambulatory Visit: Payer: Self-pay | Admitting: Hematology

## 2021-06-03 ENCOUNTER — Encounter: Payer: Self-pay | Admitting: Physical Therapy

## 2021-06-03 ENCOUNTER — Other Ambulatory Visit: Payer: Self-pay

## 2021-06-03 ENCOUNTER — Ambulatory Visit: Payer: BC Managed Care – PPO | Admitting: Physical Therapy

## 2021-06-03 DIAGNOSIS — Z170 Estrogen receptor positive status [ER+]: Secondary | ICD-10-CM

## 2021-06-03 DIAGNOSIS — L599 Disorder of the skin and subcutaneous tissue related to radiation, unspecified: Secondary | ICD-10-CM

## 2021-06-03 DIAGNOSIS — C50412 Malignant neoplasm of upper-outer quadrant of left female breast: Secondary | ICD-10-CM

## 2021-06-03 DIAGNOSIS — M25612 Stiffness of left shoulder, not elsewhere classified: Secondary | ICD-10-CM

## 2021-06-03 NOTE — Therapy (Signed)
[place], [place], [address] Phone: [phone]   Fax:  [phone]  Physical Therapy Treatment  Patient Details  Name: [name] MRN: [id number] Date of Birth: 1971/06/07 Referring Provider (PT): Shona Simpson   Encounter Date: 06/03/2021   PT End of Session - 06/03/21 1545     Visit Number 10    Number of Visits 14    Date for PT Re-Evaluation 06/23/21    PT Start Time 5427    PT Stop Time 0623    PT Time Calculation (min) 58 min    Activity Tolerance Patient tolerated treatment well    Behavior During Therapy [facility] for tasks assessed/performed             Past Medical History:  Diagnosis Date   Allergy    Anemia    present for many years, mild, stable   Anxiety    Breast cancer ([place])    Breast cancer ([place]) 12/18/2020   Dysrhythmia    PAC's on flecanide (did not tolerate beta blockers)   Family history of breast cancer 01/20/2021   Family history of prostate cancer 01/20/2021   Frequent headaches    HSV infection    fever blisters   Hypertension    Hypothyroidism     Past Surgical History:  Procedure Laterality Date   APPENDECTOMY  2013   BREAST LUMPECTOMY WITH RADIOACTIVE SEED LOCALIZATION Left 12/18/2020   Procedure: LEFT BREAST LUMPECTOMY WITH RADIOACTIVE SEED LOCALIZATION;  Surgeon: Erroll Luna, MD;  Location: [place];  Service: General;  Laterality: Left;   KNEE ARTHROSCOPY Right    lapband  2013   SENTINEL NODE BIOPSY Left 01/07/2021   Procedure: LEFT SENTINEL NODE BIOPSY;  Surgeon: Erroll Luna, MD;  Location: [place];  Service: General;  Laterality: Left;   TONSILLECTOMY  2012    There were no vitals filed for this visit.   Subjective Assessment - 06/03/21 1447     Subjective I am doing ok. I am still having the issue with my arm. I have been trying to stretch it.    Pertinent History left beast cancer with lumpectomy and a second surgery  with 2 nodes removed, Pt had 28 treatments of radiation, Pt said the she had some type of reaction after surgery and her left breast swelled severly.  The elbow pain is there but not as bad.  It occurs when I use my arm more.    Patient Stated Goals to get rid of the pain    Currently in Pain? Yes    Pain Score 2     Pain Location Axilla   and elbow   Pain Orientation Left    Pain Descriptors / Indicators Sharp    Pain Type Acute pain    Pain Onset More than a month ago    Pain Frequency Intermittent    Aggravating Factors  straightening my arm    Pain Relieving Factors bending my elbow    Effect of Pain on Daily Activities hurts really bad                               [facility] Adult PT Treatment/Exercise - 06/03/21 0001       Shoulder Exercises: Pulleys   Flexion 2 minutes    ABduction 2 minutes      Shoulder Exercises: Therapy Ball   Flexion Both;10 reps   with stretch  at top   ABduction 10 reps;Left      Manual Therapy   Soft tissue mobilization left serratus anterior with no increased tightness noted today    Myofascial Release to the Lt antecubital fossa, axilla, and upper arm to decrease cording    Passive ROM to left shoulder flexion, scaption, abd with MFR                          PT Long Term Goals - 04/17/21 1700       PT LONG TERM GOAL #1   Title Pt will report the pain in her left upper quadrant is reduced to 2/10 at the most    Baseline 5/10    Time 4    Period Weeks    Status New      PT LONG TERM GOAL #2   Title Pt will be independent in a home exericse program for left shoudler stretching and strengthening    Time 4    Period Weeks    Status New      PT LONG TERM GOAL #3   Title Pt will report she is knowledgable about lymphedem risk reduction practices    Time 4    Period Weeks    Status New                   Plan - 06/03/21 1546     Clinical Impression Statement Began AAROM exercises today to help  decrease tightness and stretch cording. Pt still feeling tightness in LUE which worsens after a day at work. Then focused on myofascial release to cording at left antecubital fossa and axilla with cording becoming less palpable by end of session.    PT Frequency 2x / week    PT Duration 4 weeks    PT Treatment/Interventions ADLs/Self Care Home Management;Functional mobility training;Therapeutic activities;Therapeutic exercise;Neuromuscular re-education;Manual techniques;Manual lymph drainage;Compression bandaging;Scar mobilization;Taping;Passive range of motion;Dry needling    PT Next Visit Plan Soft tissue work to left upper quadrant tight areas MLD to fullness in left upper arm and lateral chest  Instruct in postural exercises and cont pulleys and add ball roll up wall, supine half foam roll    PT Home Exercise Plan supine wand flex, scaption, stargazer, standing lat stretch, wall slides    Consulted and Agree with Plan of Care Patient             Patient will benefit from skilled therapeutic intervention in order to improve the following deficits and impairments:  Decreased activity tolerance, Decreased knowledge of use of DME, Decreased safety awareness, Decreased strength, Increased fascial restricitons, Impaired UE functional use, Postural dysfunction, Pain, Obesity, Impaired perceived functional ability, Decreased range of motion, Increased muscle spasms  Visit Diagnosis: Disorder of the skin and subcutaneous tissue related to radiation, unspecified  Stiffness of left shoulder, not elsewhere classified  Malignant neoplasm of upper-outer quadrant of left breast in female, estrogen receptor positive ([place])     Problem List Patient Active Problem List   Diagnosis Date Noted   Prediabetes 03/27/2021   Class 2 severe obesity due to excess calories with serious comorbidity and body mass index (BMI) of 36.0 to 36.9 in adult ([place]) 03/27/2021   B12 deficiency anemia 01/19/2021   PVC  (premature ventricular contraction) 01/17/2021   Iron deficiency anemia 01/17/2021   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

## 2021-06-10 ENCOUNTER — Encounter: Payer: Self-pay | Admitting: Internal Medicine

## 2021-06-10 ENCOUNTER — Other Ambulatory Visit: Payer: Self-pay

## 2021-06-10 ENCOUNTER — Ambulatory Visit (INDEPENDENT_AMBULATORY_CARE_PROVIDER_SITE_OTHER): Payer: BC Managed Care – PPO | Admitting: Internal Medicine

## 2021-06-10 ENCOUNTER — Ambulatory Visit: Payer: BC Managed Care – PPO

## 2021-06-10 VITALS — HR 98 | Temp 100.2°F

## 2021-06-10 DIAGNOSIS — J069 Acute upper respiratory infection, unspecified: Secondary | ICD-10-CM

## 2021-06-10 MED ORDER — PREDNISONE 10 MG PO TABS: ORAL_TABLET | ORAL | 0 refills | Status: DC

## 2021-06-10 NOTE — Progress Notes (Signed)
Virtual Visit via Video Note  I connected with [name] on 06/10/21 at  4:00 PM EDT by a video enabled telemedicine application and verified that I am speaking with the correct person using two identifiers.  Location: Patient: In her car Provider: Office   Persons participating in this video call: Webb Silversmith NP and [name]  I discussed the limitations of evaluation and management by telemedicine and the availability of in person appointments. The patient expressed understanding and agreed to proceed.  History of Present Illness:  Patient reports headache, hoarseness, cough, fever and body aches.  She reports this started 2 days ago.  She describes the headache as pressure.  She denies postnasal drip or difficulty swallowing.  She reports her cough is intermittent and nonproductive.  She reports fever up to 101 at home.  She denies runny nose, nasal congestion, ear pain, sore throat, shortness of breath, nausea, vomiting or diarrhea.  She has not taken any medications for this.  She has had multiple home COVID test.   Past Medical History:  Diagnosis Date   Allergy    Anemia    present for many years, mild, stable   Anxiety    Breast cancer ([place])    Breast cancer ([place]) 12/18/2020   Dysrhythmia    PAC's on flecanide (did not tolerate beta blockers)   Family history of breast cancer 01/20/2021   Family history of prostate cancer 01/20/2021   Frequent headaches    HSV infection    fever blisters   Hypertension    Hypothyroidism     Current Outpatient Medications  Medication Sig Dispense Refill   ALPRAZolam (XANAX) 0.25 MG tablet Take 1 tablet (0.25 mg total) by mouth 2 (two) times daily as needed. 20 tablet 0   atorvastatin (LIPITOR) 10 MG tablet Take 1 tablet (10 mg total) by mouth daily. 90 tablet 1   butalbital-acetaminophen-caffeine (FIORICET) 50-325-40 MG tablet TAKE 1 TABLET BY MOUTH EVERY 6 (SIX) HOURS AS NEEDED FOR HEADACHE. 20 tablet 2    cyclobenzaprine (FLEXERIL) 10 MG tablet Take 1 tablet (10 mg total) by mouth daily as needed (migraines). 15 tablet 0   flecainide (TAMBOCOR) 50 MG tablet TAKE 1 TABLET BY MOUTH TWICE A DAY (Patient taking differently: Take 50 mg by mouth 2 (two) times daily.) 180 tablet 1   FLUoxetine (PROZAC) 10 MG capsule TAKE 1 CAPSULE BY MOUTH EVERY DAY 90 capsule 1   fluticasone (FLONASE) 50 MCG/ACT nasal spray SPRAY 2 SPRAYS INTO EACH NOSTRIL EVERY DAY (Patient taking differently: Place 2 sprays into both nostrils daily as needed for allergies.) 48 mL 0   gabapentin (NEURONTIN) 100 MG capsule Take 1 capsule (100 mg total) by mouth 3 (three) times daily. (Patient taking differently: Take 100 mg by mouth 3 (three) times daily as needed.) 270 capsule 0   ibuprofen (ADVIL) 800 MG tablet Take 1 tablet (800 mg total) by mouth every 8 (eight) hours as needed. 30 tablet 0   levocetirizine (XYZAL) 5 MG tablet TAKE 1 TABLET BY MOUTH EVERY DAY IN THE EVENING 90 tablet 3   levothyroxine (SYNTHROID) 112 MCG tablet Take 1 tablet (112 mcg total) by mouth daily before breakfast. 90 tablet 2   losartan (COZAAR) 25 MG tablet TAKE 1 TABLET BY MOUTH EVERY DAY (Patient taking differently: Take 25 mg by mouth at bedtime.) 90 tablet 3   oxyCODONE (OXY IR/ROXICODONE) 5 MG immediate release tablet Take 1 tablet (5 mg total) by mouth every 6 (six) hours as needed  for severe pain. 30 tablet 0   predniSONE (DELTASONE) 10 MG tablet Take 6 tabs on day 1, 5 tabs on day 2, 4 tabs on day 3, 3 tabs on day 4, 2 tabs on day 5, 1 tab on day 6 21 tablet 0   rizatriptan (MAXALT-MLT) 10 MG disintegrating tablet Take 1 tablet (10 mg total) by mouth as needed for migraine. May repeat in 2 hours if needed 10 tablet 1   SUMAtriptan (IMITREX) 25 MG tablet TAKE 1 TABLET (25 MG TOTAL) BY MOUTH EVERY 2 (TWO) HOURS AS NEEDED FOR MIGRAINE. MAY REPEAT IN 2 HOURS IF HEADACHE PERSISTS OR RECURS. 10 tablet 0   valACYclovir (VALTREX) 500 MG tablet TAKE 1 TABLET (500  MG TOTAL) BY MOUTH DAILY AS NEEDED (HERPES SIMPLEX). 90 tablet 0   tamoxifen (NOLVADEX) 20 MG tablet TAKE 1 TABLET BY MOUTH EVERY DAY (Patient not taking: Reported on 06/10/2021) 90 tablet 1   No current facility-administered medications for this visit.    Allergies  Allergen Reactions   Ciprofloxacin     anxiety   Keflex [Cephalexin] Hives   Trazodone And Nefazodone Other (See Comments)    nightmares    Family History  Problem Relation Age of Onset   Depression Mother    Breast cancer Mother 5   Hyperlipidemia Father    Prostate cancer Father 57    Social History   Socioeconomic History   Marital status: Single    Spouse name: Not on file   Number of children: 1   Years of education: Not on file   Highest education level: Not on file  Occupational History   Not on file  Tobacco Use   Smoking status: Never   Smokeless tobacco: Never  Vaping Use   Vaping Use: Never used  Substance and Sexual Activity   Alcohol use: Yes    Comment: occasional   Drug use: No   Sexual activity: Yes    Partners: Male    Birth control/protection: Pill  Other Topics Concern   Not on file  Social History Narrative   Not on file   Social Determinants of Health   Financial Resource Strain: Not on file  Food Insecurity: Not on file  Transportation Needs: Not on file  Physical Activity: Not on file  Stress: Not on file  Social Connections: Not on file  Intimate Partner Violence: Not At Risk   Fear of Current or Ex-Partner: No   Emotionally Abused: No   Physically Abused: No   Sexually Abused: No     Constitutional: Patient reports headache, fatigue, fever.  Denies malaise, or abrupt weight changes.  HEENT: Patient reports hoarseness.  Denies eye pain, eye redness, ear pain, ringing in the ears, wax buildup, runny nose, nasal congestion, bloody nose, or sore throat. Respiratory: Patient reports cough.  Denies difficulty breathing, shortness of breath,  or sputum production.    Cardiovascular: Denies chest pain, chest tightness, palpitations or swelling in the hands or feet.  Gastrointestinal: Denies abdominal pain, bloating, constipation, diarrhea or blood in the stool.   No other specific complaints in a complete review of systems (except as listed in HPI above).    Observations/Objective:  Pulse 98   Temp 100.2 F (37.9 C) (Temporal)  Wt Readings from Last 3 Encounters:  05/13/21 217 lb (98.4 kg)  03/27/21 219 lb 9.6 oz (99.6 kg)  02/21/21 220 lb (99.8 kg)    General: Appears her stated age, in NAD. HEENT: Head: normal shape and  size; Nose: No congestion noted; Throat/Mouth: Hoarseness noted.  Pulmonary/Chest: Normal effort. No respiratory distress.  Neurological: Alert and oriented.    BMET    Component Value Date/Time   NA 139 05/13/2021 0940   NA 143 01/26/2020 1600   K 3.2 (L) 05/13/2021 0940   CL 102 05/13/2021 0940   CO2 25 05/13/2021 0940   GLUCOSE 93 05/13/2021 0940   BUN 10 05/13/2021 0940   BUN 11 01/26/2020 1600   CREATININE 1.10 (H) 05/13/2021 0940   CALCIUM 9.4 05/13/2021 0940   GFRNONAA >60 05/13/2021 0940   GFRAA >60 02/25/2020 2231    Lipid Panel     Component Value Date/Time   CHOL 258 (H) 03/27/2021 0950   TRIG 131 03/27/2021 0950   HDL 42 (L) 03/27/2021 0950   CHOLHDL 6.1 (H) 03/27/2021 0950   VLDL 18.8 12/21/2019 1204   LDLCALC 189 (H) 03/27/2021 0950    CBC    Component Value Date/Time   WBC 3.2 (L) 05/13/2021 0940   WBC 6.0 01/07/2021 0733   RBC 4.17 05/13/2021 0940   HGB 12.2 05/13/2021 0940   HGB 10.1 (L) 04/25/2020 1154   HCT 34.4 (L) 05/13/2021 0940   HCT 31.6 (L) 04/25/2020 1154   PLT 197 05/13/2021 0940   PLT 261 04/25/2020 1154   MCV 82.5 05/13/2021 0940   MCV 80 04/25/2020 1154   MCH 29.3 05/13/2021 0940   MCHC 35.5 05/13/2021 0940   RDW 14.9 05/13/2021 0940   RDW 16.8 (H) 04/25/2020 1154   LYMPHSABS 0.9 05/13/2021 0940   LYMPHSABS 1.4 04/25/2020 1154   MONOABS 0.4 05/13/2021 0940    EOSABS 0.1 05/13/2021 0940   EOSABS 0.0 04/25/2020 1154   BASOSABS 0.0 05/13/2021 0940   BASOSABS 0.1 04/25/2020 1154    Hgb A1C Lab Results  Component Value Date   HGBA1C 5.4 03/27/2021       Assessment and Plan:  Viral URI:  Advised her she could have tested too early and this still could be COVID, would recommend testing again tomorrow Rx for Pred taper x6 days for symptom management Encourage rest and fluids Can take OTC cough drops or cough syrup Can take Tylenol or ibuprofen as needed for fever and body aches  Return precautions discussed  Follow Up Instructions:    I discussed the assessment and treatment plan with the patient. The patient was provided an opportunity to ask questions and all were answered. The patient agreed with the plan and demonstrated an understanding of the instructions.   The patient was advised to call back or seek an in-person evaluation if the symptoms worsen or if the condition fails to improve as anticipated.   Webb Silversmith, NP

## 2021-06-10 NOTE — Patient Instructions (Signed)
Upper Respiratory Infection, Adult  An upper respiratory infection (URI) affects the nose, throat, and upper air passages. URIs are caused by germs (viruses). The most common type of URI is often called "the common cold."  Medicines cannot cure URIs, but you can do things at home to relieve your symptoms. URIs usually get better within 7-10 days.  Follow these instructions at home:  Activity  Rest as needed.  If you have a fever, stay home from work or school until your fever is gone, or until your doctor says you may return to work or school.  You should stay home until you cannot spread the infection anymore (you are not contagious).  Your doctor may have you wear a face mask so you have less risk of spreading the infection.  Relieving symptoms  Gargle with a salt-water mixture 3-4 times a day or as needed. To make a salt-water mixture, completely dissolve -1 tsp of salt in 1 cup of warm water.  Use a cool-mist humidifier to add moisture to the air. This can help you breathe more easily.  Eating and drinking    Drink enough fluid to keep your pee (urine) pale yellow.  Eat soups and other clear broths.  General instructions    Take over-the-counter and prescription medicines only as told by your doctor. These include cold medicines, fever reducers, and cough suppressants.  Do not use any products that contain nicotine or tobacco. These include cigarettes and e-cigarettes. If you need help quitting, ask your doctor.  Avoid being where people are smoking (avoid secondhand smoke).  Make sure you get regular shots and get the flu shot every year.  Keep all follow-up visits as told by your doctor. This is important.  How to avoid spreading infection to others    Wash your hands often with soap and water. If you do not have soap and water, use hand sanitizer.  Avoid touching your mouth, face, eyes, or nose.  Cough or sneeze into a tissue or your sleeve or elbow. Do not cough or sneeze into your hand or into the  air.  Contact a doctor if:  You are getting worse, not better.  You have any of these:  A fever.  Chills.  Brown or red mucus in your nose.  Yellow or brown fluid (discharge)coming from your nose.  Pain in your face, especially when you bend forward.  Swollen neck glands.  Pain with swallowing.  White areas in the back of your throat.  Get help right away if:  You have shortness of breath that gets worse.  You have very bad or constant:  Headache.  Ear pain.  Pain in your forehead, behind your eyes, and over your cheekbones (sinus pain).  Chest pain.  You have long-lasting (chronic) lung disease along with any of these:  Wheezing.  Long-lasting cough.  Coughing up blood.  A change in your usual mucus.  You have a stiff neck.  You have changes in your:  Vision.  Hearing.  Thinking.  Mood.  Summary  An upper respiratory infection (URI) is caused by a germ called a virus. The most common type of URI is often called "the common cold."  URIs usually get better within 7-10 days.  Take over-the-counter and prescription medicines only as told by your doctor.  This information is not intended to replace advice given to you by your health care provider. Make sure you discuss any questions you have with your health care provider.  Document 

## 2021-06-11 ENCOUNTER — Encounter: Payer: BC Managed Care – PPO | Admitting: Physical Therapy

## 2021-06-11 ENCOUNTER — Ambulatory Visit: Payer: BC Managed Care – PPO | Admitting: Internal Medicine

## 2021-06-17 ENCOUNTER — Ambulatory Visit
Admission: RE | Admit: 2021-06-17 | Discharge: 2021-06-17 | Disposition: A | Discharge status: Home / Self Care | Payer: BC Managed Care – PPO | Source: Ambulatory Visit | Attending: Nurse Practitioner | Admitting: Nurse Practitioner

## 2021-06-17 ENCOUNTER — Ambulatory Visit: Payer: BC Managed Care – PPO | Attending: Radiation Oncology | Admitting: Rehabilitation

## 2021-06-17 ENCOUNTER — Inpatient Hospital Stay: Payer: BC Managed Care – PPO | Attending: Oncology

## 2021-06-17 ENCOUNTER — Encounter: Payer: Self-pay | Admitting: Rehabilitation

## 2021-06-17 ENCOUNTER — Other Ambulatory Visit: Payer: Self-pay

## 2021-06-17 ENCOUNTER — Ambulatory Visit: Payer: BC Managed Care – PPO

## 2021-06-17 DIAGNOSIS — M25612 Stiffness of left shoulder, not elsewhere classified: Secondary | ICD-10-CM | POA: Insufficient documentation

## 2021-06-17 DIAGNOSIS — Z483 Aftercare following surgery for neoplasm: Secondary | ICD-10-CM | POA: Insufficient documentation

## 2021-06-17 DIAGNOSIS — M25512 Pain in left shoulder: Secondary | ICD-10-CM | POA: Diagnosis present

## 2021-06-17 DIAGNOSIS — M6281 Muscle weakness (generalized): Secondary | ICD-10-CM | POA: Diagnosis present

## 2021-06-17 DIAGNOSIS — Z170 Estrogen receptor positive status [ER+]: Secondary | ICD-10-CM | POA: Diagnosis present

## 2021-06-17 DIAGNOSIS — D519 Vitamin B12 deficiency anemia, unspecified: Secondary | ICD-10-CM

## 2021-06-17 DIAGNOSIS — L599 Disorder of the skin and subcutaneous tissue related to radiation, unspecified: Secondary | ICD-10-CM | POA: Insufficient documentation

## 2021-06-17 DIAGNOSIS — C50412 Malignant neoplasm of upper-outer quadrant of left female breast: Secondary | ICD-10-CM

## 2021-06-17 DIAGNOSIS — E538 Deficiency of other specified B group vitamins: Secondary | ICD-10-CM | POA: Diagnosis not present

## 2021-06-17 MED ORDER — CYANOCOBALAMIN 1000 MCG/ML IJ SOLN
1000.0000 ug | Freq: Once | INTRAMUSCULAR | Status: AC
  Administered 2021-06-17: 1000 ug via INTRAMUSCULAR
  Filled 2021-06-17: qty 1

## 2021-06-17 MED ORDER — GADOBUTROL 1 MMOL/ML IV SOLN
9.0000 mL | Freq: Once | INTRAVENOUS | Status: AC | PRN
  Administered 2021-06-17: 9 mL via INTRAVENOUS

## 2021-06-17 NOTE — Therapy (Signed)
[facility] @ [place], [place], [address] Phone: [phone]   Fax:  [phone]  Physical Therapy Treatment  Patient Details  Name: [name] MRN: [id number] Date of Birth: 06-16-1971 Referring Provider (PT): Shona Simpson   Encounter Date: 06/17/2021   PT End of Session - 06/17/21 1049     Visit Number 11    Number of Visits 14    Date for PT Re-Evaluation 06/23/21    PT Start Time 1000    PT Stop Time 4920    PT Time Calculation (min) 49 min    Activity Tolerance Patient tolerated treatment well    Behavior During Therapy [facility] for tasks assessed/performed             Past Medical History:  Diagnosis Date   Allergy    Anemia    present for many years, mild, stable   Anxiety    Breast cancer ([place])    Breast cancer ([place]) 12/18/2020   Dysrhythmia    PAC's on flecanide (did not tolerate beta blockers)   Family history of breast cancer 01/20/2021   Family history of prostate cancer 01/20/2021   Frequent headaches    HSV infection    fever blisters   Hypertension    Hypothyroidism     Past Surgical History:  Procedure Laterality Date   APPENDECTOMY  2013   BREAST LUMPECTOMY WITH RADIOACTIVE SEED LOCALIZATION Left 12/18/2020   Procedure: LEFT BREAST LUMPECTOMY WITH RADIOACTIVE SEED LOCALIZATION;  Surgeon: Erroll Luna, MD;  Location: [facility];  Service: General;  Laterality: Left;   KNEE ARTHROSCOPY Right    lapband  2013   SENTINEL NODE BIOPSY Left 01/07/2021   Procedure: LEFT SENTINEL NODE BIOPSY;  Surgeon: Erroll Luna, MD;  Location: [facility];  Service: General;  Laterality: Left;   TONSILLECTOMY  2012    There were no vitals filed for this visit.   Subjective Assessment - 06/17/21 1000     Subjective I need to get a new appt for a sleeve.  it was cancelled last week.    Pertinent History left beast cancer with lumpectomy and a second surgery with 2 nodes  removed, Pt had 28 treatments of radiation, Pt said the she had some type of reaction after surgery and her left breast swelled severly.  The elbow pain is there but not as bad.  It occurs when I use my arm more.    Patient Stated Goals to get rid of the pain    Currently in Pain? Yes    Pain Score 2     Pain Location Shoulder    Pain Orientation Left    Pain Descriptors / Indicators Aching    Pain Onset 1 to 4 weeks ago    Pain Frequency Constant                [facility] PT Assessment - 06/17/21 0001       AROM   Left Shoulder Flexion 135 Degrees   more of a soreness on the top of the shoulder   Left Shoulder ABduction 158 Degrees   top of the shoulder soreness   Left Shoulder Internal Rotation --   WNL behind the back front of the shoulder soreness   Left Shoulder External Rotation --   bil ER overhead with pull in pectoralis and all axilla  [facility] Adult PT Treatment/Exercise - 06/17/21 0001       Shoulder Exercises: Standing   External Rotation Both;10 reps    Theraband Level (Shoulder External Rotation) Level 1 (Yellow)    Extension Both;10 reps    Theraband Level (Shoulder Extension) Level 1 (Yellow)    Row Both;10 reps    Theraband Level (Shoulder Row) Level 1 (Yellow)      Shoulder Exercises: Pulleys   Flexion 2 minutes    ABduction 2 minutes      Shoulder Exercises: Therapy Ball   Flexion Both;10 reps    ABduction 10 reps;Left      Manual Therapy   Soft tissue mobilization left serratus anterior with no increased tightness noted today    Scapular Mobilization In Rt S/L to Lt into protraction and retraction , scapular depression during P/ROM    Passive ROM to left shoulder flexion, scaption, abd with MFR                          PT Long Term Goals - 04/17/21 1700       PT LONG TERM GOAL #1   Title Pt will report the pain in her left upper quadrant is reduced to 2/10 at the most    Baseline 5/10     Time 4    Period Weeks    Status New      PT LONG TERM GOAL #2   Title Pt will be independent in a home exericse program for left shoudler stretching and strengthening    Time 4    Period Weeks    Status New      PT LONG TERM GOAL #3   Title Pt will report she is knowledgable about lymphedem risk reduction practices    Time 4    Period Weeks    Status New                   Plan - 06/17/21 1049     Clinical Impression Statement cording is doing much better than last visit with this PT.  Focused on more postural strengthening and postural education to decrease scapular protraction and hopefully decrease impingement.  Cording is much improved but still evident in distal lateral upper arm    PT Frequency 2x / week    PT Duration 4 weeks    PT Treatment/Interventions ADLs/Self Care Home Management;Functional mobility training;Therapeutic activities;Therapeutic exercise;Neuromuscular re-education;Manual techniques;Manual lymph drainage;Compression bandaging;Scar mobilization;Taping;Passive range of motion;Dry needling    PT Next Visit Plan continue postural TE, soft tissue work to left upper quadrant tight areas in including UT and lat, MLD to fullness in left upper arm and lateral chest , try half roll?    Consulted and Agree with Plan of Care Patient             Patient will benefit from skilled therapeutic intervention in order to improve the following deficits and impairments:     Visit Diagnosis: Disorder of the skin and subcutaneous tissue related to radiation, unspecified  Stiffness of left shoulder, not elsewhere classified  Malignant neoplasm of upper-outer quadrant of left breast in female, estrogen receptor positive (HCC)  Muscle weakness (generalized)  Acute pain of left shoulder  Aftercare following surgery for neoplasm     Problem List Patient Active Problem List   Diagnosis Date Noted   Prediabetes 03/27/2021   Class 2 severe obesity due to  excess calories with serious comorbidity and body mass  index (BMI) of 36.0 to 36.9 in adult [facility]) 03/27/2021   B12 deficiency anemia 01/19/2021   PVC (premature ventricular contraction) 01/17/2021   Iron deficiency anemia 01/17/2021   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

## 2021-06-20 ENCOUNTER — Emergency Department
Admission: EM | Admit: 2021-06-20 | Discharge: 2021-06-21 | Disposition: A | Discharge status: Home / Self Care | Payer: BC Managed Care – PPO | Attending: Emergency Medicine | Admitting: Emergency Medicine

## 2021-06-20 ENCOUNTER — Other Ambulatory Visit: Payer: Self-pay

## 2021-06-20 DIAGNOSIS — F410 Panic disorder [episodic paroxysmal anxiety] without agoraphobia: Secondary | ICD-10-CM | POA: Insufficient documentation

## 2021-06-20 DIAGNOSIS — Z5321 Procedure and treatment not carried out due to patient leaving prior to being seen by health care provider: Secondary | ICD-10-CM | POA: Diagnosis not present

## 2021-06-20 NOTE — ED Triage Notes (Signed)
Pt in via POV, reports taking a CBD gummy around 2030, since has been in a panic state.  Paranoid and screaming in triage, asking for "Ativan and a tube to just put me out."  Sinus tach on EKG, other vitals WDL.

## 2021-06-21 ENCOUNTER — Telehealth: Payer: Self-pay | Admitting: Emergency Medicine

## 2021-06-21 ENCOUNTER — Encounter: Payer: Self-pay | Admitting: Emergency Medicine

## 2021-06-21 ENCOUNTER — Other Ambulatory Visit: Payer: Self-pay

## 2021-06-21 LAB — COMPREHENSIVE METABOLIC PANEL
ALT: 29 U/L (ref 0–44)
AST: 48 U/L — ABNORMAL HIGH (ref 15–41)
Albumin: 3.8 g/dL (ref 3.5–5.0)
Alkaline Phosphatase: 123 U/L (ref 38–126)
Anion gap: 19 — ABNORMAL HIGH (ref 5–15)
BUN: 15 mg/dL (ref 6–20)
CO2: 19 mmol/L — ABNORMAL LOW (ref 22–32)
Calcium: 9.1 mg/dL (ref 8.9–10.3)
Chloride: 97 mmol/L — ABNORMAL LOW (ref 98–111)
Creatinine, Ser: 1.19 mg/dL — ABNORMAL HIGH (ref 0.44–1.00)
GFR, Estimated: 56 mL/min — ABNORMAL LOW (ref >60)
Glucose, Bld: 258 mg/dL — ABNORMAL HIGH (ref 70–99)
Potassium: 2.6 mmol/L — CL (ref 3.5–5.1)
Sodium: 135 mmol/L (ref 135–145)
Total Bilirubin: 0.4 mg/dL (ref 0.3–1.2)
Total Protein: 7.3 g/dL (ref 6.5–8.1)

## 2021-06-21 LAB — CBC
HCT: 34.2 % — ABNORMAL LOW (ref 36.0–46.0)
Hemoglobin: 12 g/dL (ref 12.0–15.0)
MCH: 30.2 pg (ref 26.0–34.0)
MCHC: 35.1 g/dL (ref 30.0–36.0)
MCV: 86.1 fL (ref 80.0–100.0)
Platelets: 303 10*3/uL (ref 150–400)
RBC: 3.97 MIL/uL (ref 3.87–5.11)
RDW: 13 % (ref 11.5–15.5)
WBC: 8.1 10*3/uL (ref 4.0–10.5)
nRBC: 0 % (ref 0.0–0.2)

## 2021-06-21 NOTE — Telephone Encounter (Signed)
EDP made aware of abnormal labs with K 2.6. Attempted to call patient to notify of abnormal labs and request patient return for eval, no answer.

## 2021-06-22 ENCOUNTER — Encounter: Payer: Self-pay | Admitting: Internal Medicine

## 2021-06-22 DIAGNOSIS — E876 Hypokalemia: Secondary | ICD-10-CM

## 2021-06-23 ENCOUNTER — Ambulatory Visit: Payer: BC Managed Care – PPO | Admitting: Rehabilitation

## 2021-06-23 ENCOUNTER — Other Ambulatory Visit: Payer: BC Managed Care – PPO

## 2021-06-23 ENCOUNTER — Other Ambulatory Visit: Payer: Self-pay

## 2021-06-23 ENCOUNTER — Encounter: Payer: Self-pay | Admitting: Rehabilitation

## 2021-06-23 DIAGNOSIS — M25612 Stiffness of left shoulder, not elsewhere classified: Secondary | ICD-10-CM

## 2021-06-23 DIAGNOSIS — C50412 Malignant neoplasm of upper-outer quadrant of left female breast: Secondary | ICD-10-CM

## 2021-06-23 DIAGNOSIS — L599 Disorder of the skin and subcutaneous tissue related to radiation, unspecified: Secondary | ICD-10-CM

## 2021-06-23 DIAGNOSIS — M6281 Muscle weakness (generalized): Secondary | ICD-10-CM

## 2021-06-23 DIAGNOSIS — E876 Hypokalemia: Secondary | ICD-10-CM

## 2021-06-23 DIAGNOSIS — Z170 Estrogen receptor positive status [ER+]: Secondary | ICD-10-CM

## 2021-06-23 DIAGNOSIS — N6489 Other specified disorders of breast: Secondary | ICD-10-CM

## 2021-06-23 NOTE — Therapy (Signed)
[facility] @ [place], [place], [address] Phone: [phone]   Fax:  [phone]  Physical Therapy Treatment  Patient Details  Name: [name] MRN: [id number] Date of Birth: 06/08/1971 Referring Provider (PT): Shona Simpson   Encounter Date: 06/23/2021   PT End of Session - 06/23/21 0904     Visit Number 12    Number of Visits 14    Date for PT Re-Evaluation 06/23/21    PT Start Time 0827   arrived late   PT Stop Time 0900    PT Time Calculation (min) 33 min    Activity Tolerance Patient tolerated treatment well    Behavior During Therapy [facility] for tasks assessed/performed             Past Medical History:  Diagnosis Date   Allergy    Anemia    present for many years, mild, stable   Anxiety    Breast cancer ([place])    Breast cancer ([place]) 12/18/2020   Dysrhythmia    PAC's on flecanide (did not tolerate beta blockers)   Family history of breast cancer 01/20/2021   Family history of prostate cancer 01/20/2021   Frequent headaches    HSV infection    fever blisters   Hypertension    Hypothyroidism     Past Surgical History:  Procedure Laterality Date   APPENDECTOMY  2013   BREAST LUMPECTOMY WITH RADIOACTIVE SEED LOCALIZATION Left 12/18/2020   Procedure: LEFT BREAST LUMPECTOMY WITH RADIOACTIVE SEED LOCALIZATION;  Surgeon: Erroll Luna, MD;  Location: [facility];  Service: General;  Laterality: Left;   KNEE ARTHROSCOPY Right    lapband  2013   SENTINEL NODE BIOPSY Left 01/07/2021   Procedure: LEFT SENTINEL NODE BIOPSY;  Surgeon: Erroll Luna, MD;  Location: [facility];  Service: General;  Laterality: Left;   TONSILLECTOMY  2012    There were no vitals filed for this visit.   Subjective Assessment - 06/23/21 0826     Subjective I got measured for the sleeve    Pertinent History left beast cancer with lumpectomy and a second surgery with 2 nodes removed, Pt had 28  treatments of radiation, Pt said the she had some type of reaction after surgery and her left breast swelled severly.  The elbow pain is there but not as bad.  It occurs when I use my arm more.    Currently in Pain? Yes    Pain Score 3     Pain Location Arm    Pain Orientation Upper;Left    Pain Descriptors / Indicators Aching    Pain Type Chronic pain    Pain Onset More than a month ago    Pain Frequency Intermittent                               [facility] Adult PT Treatment/Exercise - 06/23/21 0001       Shoulder Exercises: Standing   External Rotation Both;15 reps    Theraband Level (Shoulder External Rotation) Level 2 (Red)    Extension Both;15 reps    Theraband Level (Shoulder Extension) Level 2 (Red)    Row 15 reps;Both    Theraband Level (Shoulder Row) Level 2 (Red)      Shoulder Exercises: Pulleys   Flexion 2 minutes    ABduction 2 minutes      Shoulder Exercises: Therapy Ball   Flexion 5  reps    Flexion Limitations with 5" holds at top      Manual Therapy   Soft tissue mobilization to the left upper arm    Myofascial Release to the Lt antecubital fossa, axilla, and upper arm to decrease cording                          PT Long Term Goals - 04/17/21 1700       PT LONG TERM GOAL #1   Title Pt will report the pain in her left upper quadrant is reduced to 2/10 at the most    Baseline 5/10    Time 4    Period Weeks    Status New      PT LONG TERM GOAL #2   Title Pt will be independent in a home exericse program for left shoudler stretching and strengthening    Time 4    Period Weeks    Status New      PT LONG TERM GOAL #3   Title Pt will report she is knowledgable about lymphedem risk reduction practices    Time 4    Period Weeks    Status New                   Plan - 06/23/21 0905     Clinical Impression Statement Less impingement like pain today but continued postural TE.  Did not notice POC and visits  ending until after visit today so will perform goal check and status check to extend for more visits next time if needed.  Pt continues to have cording and impingement like pain but may or may not want to do continued in person appts vs HEP.    PT Frequency 2x / week    PT Duration 4 weeks    PT Treatment/Interventions ADLs/Self Care Home Management;Functional mobility training;Therapeutic activities;Therapeutic exercise;Neuromuscular re-education;Manual techniques;Manual lymph drainage;Compression bandaging;Scar mobilization;Taping;Passive range of motion;Dry needling    PT Next Visit Plan goal check, more appts or DC? continue postural TE, cording releaseLt UE    Consulted and Agree with Plan of Care Patient             Patient will benefit from skilled therapeutic intervention in order to improve the following deficits and impairments:     Visit Diagnosis: Disorder of the skin and subcutaneous tissue related to radiation, unspecified  Stiffness of left shoulder, not elsewhere classified  Malignant neoplasm of upper-outer quadrant of left breast in female, estrogen receptor positive (HCC)  Muscle weakness (generalized)     Problem List Patient Active Problem List   Diagnosis Date Noted   Prediabetes 03/27/2021   Class 2 severe obesity due to excess calories with serious comorbidity and body mass index (BMI) of 36.0 to 36.9 in adult ([place]) 03/27/2021   B12 deficiency anemia 01/19/2021   PVC (premature ventricular contraction) 01/17/2021   Iron deficiency anemia 01/17/2021   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

## 2021-06-24 ENCOUNTER — Encounter: Payer: Self-pay | Admitting: Internal Medicine

## 2021-06-24 LAB — LIPID PANEL
Cholesterol: 194 mg/dL (ref <200)
HDL: 40 mg/dL — ABNORMAL LOW (ref >=50)
LDL Cholesterol (Calc): 125 mg/dL (calc) — ABNORMAL HIGH
Non-HDL Cholesterol (Calc): 154 mg/dL (calc) — ABNORMAL HIGH (ref <130)
Total CHOL/HDL Ratio: 4.9 (calc) (ref <5.0)
Triglycerides: 174 mg/dL — ABNORMAL HIGH (ref <150)

## 2021-06-24 LAB — POTASSIUM: Potassium: 3.7 mmol/L (ref 3.5–5.3)

## 2021-06-24 MED ORDER — ATORVASTATIN CALCIUM 10 MG PO TABS: 10.0000 mg | ORAL_TABLET | Freq: Every day | ORAL | 1 refills | Status: DC

## 2021-06-28 ENCOUNTER — Other Ambulatory Visit: Payer: Self-pay | Admitting: Internal Medicine

## 2021-06-29 NOTE — Telephone Encounter (Signed)
Requested Prescriptions  Pending Prescriptions Disp Refills  . valACYclovir (VALTREX) 500 MG tablet [Pharmacy Med Name: VALACYCLOVIR HCL 500 MG TABLET] 90 tablet 0    Sig: TAKE 1 TABLET (500 MG TOTAL) BY MOUTH DAILY AS NEEDED (HERPES SIMPLEX).     Antimicrobials:  Antiviral Agents - Anti-Herpetic Passed - 06/28/2021  1:08 PM      Passed - Valid encounter within last 12 months    Recent Outpatient Visits          2 weeks ago [facility] [place], [place] W, NP   3 months ago Acute non-recurrent frontal sinusitis   [facility] [address], [place], NP   5 months ago Encounter for general adult medical examination with abnormal findings   [facility], [place] Keens, NP

## 2021-07-01 ENCOUNTER — Other Ambulatory Visit: Payer: Self-pay

## 2021-07-01 ENCOUNTER — Ambulatory Visit: Payer: BC Managed Care – PPO

## 2021-07-01 DIAGNOSIS — M6281 Muscle weakness (generalized): Secondary | ICD-10-CM

## 2021-07-01 DIAGNOSIS — Z170 Estrogen receptor positive status [ER+]: Secondary | ICD-10-CM

## 2021-07-01 DIAGNOSIS — L599 Disorder of the skin and subcutaneous tissue related to radiation, unspecified: Secondary | ICD-10-CM

## 2021-07-01 DIAGNOSIS — M25512 Pain in left shoulder: Secondary | ICD-10-CM

## 2021-07-01 DIAGNOSIS — M25612 Stiffness of left shoulder, not elsewhere classified: Secondary | ICD-10-CM

## 2021-07-01 DIAGNOSIS — Z483 Aftercare following surgery for neoplasm: Secondary | ICD-10-CM

## 2021-07-01 DIAGNOSIS — C50412 Malignant neoplasm of upper-outer quadrant of left female breast: Secondary | ICD-10-CM

## 2021-07-01 NOTE — Therapy (Signed)
[facility] @ [place], [place], [address] Phone: [phone]   Fax:  [phone]  Physical Therapy Treatment  Patient Details  Name: [name] MRN: [id number] Date of Birth: [birth year] Referring Provider (PT): Shona Simpson   Encounter Date: 07/01/2021   PT End of Session - 07/01/21 0822     Visit Number 13    Number of Visits 25    Date for PT Re-Evaluation 08/12/21    PT Start Time 0810    PT Stop Time 0856    PT Time Calculation (min) 46 min    Activity Tolerance Patient tolerated treatment well    Behavior During Therapy [facility] for tasks assessed/performed             Past Medical History:  Diagnosis Date   Allergy    Anemia    present for many years, mild, stable   Anxiety    Breast cancer ([place])    Breast cancer ([place]) 12/18/2020   Dysrhythmia    PAC's on flecanide (did not tolerate beta blockers)   Family history of breast cancer 01/20/2021   Family history of prostate cancer 01/20/2021   Frequent headaches    HSV infection    fever blisters   Hypertension    Hypothyroidism     Past Surgical History:  Procedure Laterality Date   APPENDECTOMY  2013   BREAST LUMPECTOMY WITH RADIOACTIVE SEED LOCALIZATION Left 12/18/2020   Procedure: LEFT BREAST LUMPECTOMY WITH RADIOACTIVE SEED LOCALIZATION;  Surgeon: Erroll Luna, MD;  Location: [place];  Service: General;  Laterality: Left;   KNEE ARTHROSCOPY Right    lapband  2013   SENTINEL NODE BIOPSY Left 01/07/2021   Procedure: LEFT SENTINEL NODE BIOPSY;  Surgeon: Erroll Luna, MD;  Location: [place];  Service: General;  Laterality: Left;   TONSILLECTOMY  2012    There were no vitals filed for this visit.   Subjective Assessment - 07/01/21 0812     Subjective I got measured for the sleeve but I still have not received it yet. Yesterday the cording was really bad and I had to take a pain pill.  It feels some better  today after stretching yesterday. Still have a lot of pain in the axillary region and pain at the lateral breast.  It doesn't feel swollen, just painful, but yesterday it felt swollen. An MRI in September showed a seroma that they are just watching, and don't think it needs to be drained.    Pertinent History left beast cancer with lumpectomy and a second surgery with 2 nodes removed, Pt had 28 treatments of radiation, Pt said the she had some type of reaction after surgery and her left breast swelled severly.  The elbow pain is there but not as bad.  It occurs when I use my arm more.    Patient Stated Goals to get rid of the pain    Currently in Pain? Yes    Pain Score 3     Pain Location Axilla    Pain Orientation Left    Pain Descriptors / Indicators Tender;Dull    Pain Type Chronic pain    Pain Onset More than a month ago    Pain Frequency Intermittent    Aggravating Factors  for cording;stretching arm, reaching to extremes                [facility] PT Assessment - 07/01/21 0001  Assessment   Medical Diagnosis left breast cancer    Referring Provider (PT) Shona Simpson    Onset Date/Surgical Date 12/15/20    Hand Dominance Left      Balance Screen   Has the patient fallen in the past 6 months Yes    How many times? 1    Has the patient had a decrease in activity level because of a fear of falling?  No    Is the patient reluctant to leave their home because of a fear of falling?  No      Prior Function   Level of Independence Independent      AROM   Left Shoulder Flexion 123 Degrees    Left Shoulder ABduction 130 Degrees               LYMPHEDEMA/ONCOLOGY QUESTIONNAIRE - 07/01/21 0001       Type   Cancer Type left breast cancer      Surgeries   Lumpectomy Date 12/18/20    Other Surgery Date 01/07/21    Number Lymph Nodes Removed 2      Treatment   Past Radiation Treatment Yes   completed   Date 03/26/21    Current Hormone Treatment Yes    Date --    will start soom   Drug Name tamoxifen      Right Upper Extremity Lymphedema   15 cm Proximal to Olecranon Process 37.1 cm    Olecranon Process 27.9 cm    15 cm Proximal to Ulnar Styloid Process 25.4 cm    Just Proximal to Ulnar Styloid Process 16.9 cm    Across Hand at [employer] 19.6 cm    At [place] of 2nd Digit 6.7 cm      Left Upper Extremity Lymphedema   15 cm Proximal to Olecranon Process 38 cm    Olecranon Process 27.6 cm    15 cm Proximal to Ulnar Styloid Process 24.9 cm    Just Proximal to Ulnar Styloid Process 17.4 cm    Across Hand at [employer] 19.5 cm    At [place] of 2nd Digit 6.6 cm                        [facility] Adult PT Treatment/Exercise - 07/01/21 0001       Shoulder Exercises: Pulleys   Flexion 2 minutes    ABduction 2 minutes      Manual Therapy   Myofascial Release to the Lt antecubital fossa, axilla, and upper arm to decrease cording    Passive ROM to left shoulder flexion, scaption, abd with MFR                          PT Long Term Goals - 07/01/21 0981       PT LONG TERM GOAL #1   Title Pt will report the pain in her left upper quadrant is reduced to 2/10 at the most    Baseline 3-4/10 presently    Time 4    Period Weeks    Status On-going    Target Date 08/12/21      PT LONG TERM GOAL #2   Title Pt will be independent in a home exericse program for left shoudler stretching and strengthening    Time 4    Period Weeks    Status Achieved    Target Date 07/01/21      PT  LONG TERM GOAL #3   Title Pt will report she is knowledgable about lymphedem risk reduction practices    Time 4    Period Weeks    Status On-going    Target Date 07/29/21                   Plan - 07/01/21 0824     Clinical Impression Statement Pt prefers to have therapy at [facility] since she lives in [place] and now has to travel 42 min to get here. Message sent to Garlon Hatchet, PT supervisor.  Pt has limitations in  Left shoulder AROM today due to increased pain from cording.  She continues to complain of axillary soreness and soreness at left lateral breast with small seroma present that will not be addressed with draining.  She is awaiting a compression sleeve.  She has been set up for our virtual ABC class on 07/14/2021.  She will benefit from continued PT to address cording, ROM limtations, strengthening, and breast swelling.    Personal Factors and Comorbidities Comorbidity 3+    Comorbidities 2 surgeries on left breast, abnormal swelling reaction after second breast surgey, radiation    Examination-Activity Limitations Reach Overhead    Examination-Participation Restrictions Yard Work;Laundry;Cleaning;Driving;Meal Prep;Shop;Occupation    Stability/Clinical Decision Making Stable/Uncomplicated    Clinical Decision Making Low    Rehab Potential Excellent    PT Frequency 2x / week    PT Duration 6 weeks    PT Treatment/Interventions ADLs/Self Care Home Management;Functional mobility training;Therapeutic activities;Therapeutic exercise;Neuromuscular re-education;Manual techniques;Manual lymph drainage;Compression bandaging;Scar mobilization;Taping;Passive range of motion;Dry needling    PT Next Visit Plan continue postural TE, cording release Lt UE, strengthening, Breast MLD    PT Home Exercise Plan supine wand flex, scaption, stargazer, standing lat stretch, wall slides    Consulted and Agree with Plan of Care Patient             Patient will benefit from skilled therapeutic intervention in order to improve the following deficits and impairments:  Decreased activity tolerance, Decreased knowledge of use of DME, Decreased safety awareness, Decreased strength, Increased fascial restricitons, Impaired UE functional use, Postural dysfunction, Pain, Obesity, Impaired perceived functional ability, Decreased range of motion, Increased muscle spasms  Visit Diagnosis: Disorder of the skin and subcutaneous  tissue related to radiation, unspecified  Stiffness of left shoulder, not elsewhere classified  Malignant neoplasm of upper-outer quadrant of left breast in female, estrogen receptor positive (HCC)  Muscle weakness (generalized)  Acute pain of left shoulder  Aftercare following surgery for neoplasm     Problem List Patient Active Problem List   Diagnosis Date Noted   Prediabetes 03/27/2021   Class 2 severe obesity due to excess calories with serious comorbidity and body mass index (BMI) of 36.0 to 36.9 in adult ([place]) 03/27/2021   B12 deficiency anemia 01/19/2021   PVC (premature ventricular contraction) 01/17/2021   Iron deficiency anemia 01/17/2021   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

## 2021-07-08 ENCOUNTER — Ambulatory Visit: Payer: BC Managed Care – PPO

## 2021-07-10 ENCOUNTER — Other Ambulatory Visit: Payer: Self-pay | Admitting: Hematology

## 2021-07-10 ENCOUNTER — Encounter: Payer: Self-pay | Admitting: Nurse Practitioner

## 2021-07-10 MED ORDER — TRAMADOL HCL 50 MG PO TABS: 50.0000 mg | ORAL_TABLET | Freq: Two times a day (BID) | ORAL | 0 refills | Status: DC | PRN

## 2021-07-15 ENCOUNTER — Other Ambulatory Visit: Payer: Self-pay

## 2021-07-15 ENCOUNTER — Encounter: Payer: Self-pay | Admitting: Internal Medicine

## 2021-07-15 ENCOUNTER — Ambulatory Visit: Payer: BC Managed Care – PPO

## 2021-07-15 ENCOUNTER — Inpatient Hospital Stay: Payer: BC Managed Care – PPO | Attending: Oncology

## 2021-07-15 DIAGNOSIS — E538 Deficiency of other specified B group vitamins: Secondary | ICD-10-CM | POA: Insufficient documentation

## 2021-07-15 DIAGNOSIS — D519 Vitamin B12 deficiency anemia, unspecified: Secondary | ICD-10-CM

## 2021-07-15 MED ORDER — CYANOCOBALAMIN 1000 MCG/ML IJ SOLN
1000.0000 ug | Freq: Once | INTRAMUSCULAR | Status: AC
  Administered 2021-07-15: 1000 ug via INTRAMUSCULAR
  Filled 2021-07-15: qty 1

## 2021-07-17 ENCOUNTER — Ambulatory Visit: Payer: BC Managed Care – PPO | Attending: Nurse Practitioner | Admitting: Occupational Therapy

## 2021-07-17 ENCOUNTER — Encounter: Payer: Self-pay | Admitting: Occupational Therapy

## 2021-07-17 DIAGNOSIS — L599 Disorder of the skin and subcutaneous tissue related to radiation, unspecified: Secondary | ICD-10-CM | POA: Diagnosis present

## 2021-07-17 DIAGNOSIS — M25612 Stiffness of left shoulder, not elsewhere classified: Secondary | ICD-10-CM | POA: Insufficient documentation

## 2021-07-17 DIAGNOSIS — M6281 Muscle weakness (generalized): Secondary | ICD-10-CM | POA: Insufficient documentation

## 2021-07-17 DIAGNOSIS — C50412 Malignant neoplasm of upper-outer quadrant of left female breast: Secondary | ICD-10-CM | POA: Insufficient documentation

## 2021-07-17 DIAGNOSIS — Z170 Estrogen receptor positive status [ER+]: Secondary | ICD-10-CM | POA: Insufficient documentation

## 2021-07-17 DIAGNOSIS — Z483 Aftercare following surgery for neoplasm: Secondary | ICD-10-CM | POA: Insufficient documentation

## 2021-07-17 DIAGNOSIS — M25512 Pain in left shoulder: Secondary | ICD-10-CM | POA: Diagnosis present

## 2021-07-17 NOTE — Therapy (Signed)
[facility] [address] [place], [address] Phone: [phone]   Fax:  [phone]  Occupational Therapy Evaluation  Patient Details  Name: [name] MRN: [id number] Date of Birth: Jun 11, 1971 Referring Provider (OT): Dara Lords   Encounter Date: 07/17/2021   OT End of Session - 07/17/21 1907     Visit Number 1    Number of Visits 12    Date for OT Re-Evaluation 09/11/21    OT Start Time 0730    OT Stop Time 0818    OT Time Calculation (min) 48 min    Activity Tolerance Patient tolerated treatment well    Behavior During Therapy [facility] for tasks assessed/performed             Past Medical History:  Diagnosis Date   Allergy    Anemia    present for many years, mild, stable   Anxiety    Breast cancer ([place])    Breast cancer ([place]) 12/18/2020   Dysrhythmia    PAC's on flecanide (did not tolerate beta blockers)   Family history of breast cancer 01/20/2021   Family history of prostate cancer 01/20/2021   Frequent headaches    HSV infection    fever blisters   Hypertension    Hypothyroidism     Past Surgical History:  Procedure Laterality Date   APPENDECTOMY  2013   BREAST LUMPECTOMY WITH RADIOACTIVE SEED LOCALIZATION Left 12/18/2020   Procedure: LEFT BREAST LUMPECTOMY WITH RADIOACTIVE SEED LOCALIZATION;  Surgeon: Erroll Luna, MD;  Location: [place];  Service: General;  Laterality: Left;   KNEE ARTHROSCOPY Right    lapband  2013   SENTINEL NODE BIOPSY Left 01/07/2021   Procedure: LEFT SENTINEL NODE BIOPSY;  Surgeon: Erroll Luna, MD;  Location: [place];  Service: General;  Laterality: Left;   TONSILLECTOMY  2012    There were no vitals filed for this visit.   Subjective Assessment - 07/17/21 1900     Subjective  I had therapy in [place] but I cannot miss work and go there - it takes 45 min - my arm hurts really bad when I reach - it is in my breast, under my arm -  into my elbow and forearm    Pertinent History An MRI in September showed a seroma that they are just watching, and don't think it needs to be drained.       Pertinent History left beast cancer with lumpectomy and a second surgery with 2 nodes removed, Pt had 28 treatments of radiation, Pt said the she had some type of reaction after surgery and her left breast swelled severly.  The breast and L arm pai. It occurs when I use my arm more.    Patient Stated Goals I want the pain better so I can use my arm like [place]    Currently in Pain? Yes    Pain Score 5     Pain Location Arm    Pain Orientation Left    Pain Descriptors / Indicators Tightness;Tender    Pain Type Surgical pain    Pain Onset More than a month ago    Pain Frequency Intermittent               [facility] OT Assessment - 07/17/21 0001       Assessment   Medical Diagnosis left breat cancer    Referring Provider (OT) Dara Lords    Onset Date/Surgical Date 12/15/20  Hand Dominance Left      Prior Function   Vocation Full time employment    Leisure [employer] ,  read, walks her dog 3 times a day.      AROM   Left Shoulder Flexion 150 Degrees    Left Shoulder ABduction 100 Degrees   pain              Mini massager use on lateral L breast scar and fibrosis and upper breast - great response -and in axilla  Done graston tool nr 2 on anterior upper arm and forearm -  And recommend pt to get mini massager to use at home and jovipak breast pad - unilateral breast pad under bra or camisole   Pt with increase AROM in flexion ,ABD and ext rotation - less pain  Pt HEP review                OT Education - 07/17/21 1907     Education Details Findings of eval and HEP    Person(s) Educated Patient    Methods Explanation;Demonstration;Tactile cues;Verbal cues    Comprehension Returned demonstration;Verbalized understanding;Verbal cues required                 OT Long Term Goals - 07/17/21 1920       OT  LONG TERM GOAL #1   Title Pt will report the pain in her left upper quadrant is reduced to 2/10 at the most    Baseline pain in L breast, axilla and into arm 6/10 - limiing her AROM and use of L arm    Time 4    Period Weeks    Status New    Target Date 08/14/21      OT LONG TERM GOAL #2   Title Pt will be independent in a home exericse program  to decrease scar tissue , pain and increase  left shoudler AROM and strength    Baseline have some knowledge using bands but still cradle when pain - do some scar massage - pain still 6/10 and tender breast at scar , upper L breast and axilla into arm    Time 6    Period Weeks    Status New    Target Date 08/28/21      OT LONG TERM GOAL #3   Title Pt will report  knowledgable about lymphedem risk reduction practices    Baseline review this date with pt - cont to need into    Time 4    Period Weeks    Status New    Target Date 08/14/21                   Plan - 07/17/21 1909     Clinical Impression Statement Pt had L lumpectomy in April 22- and radiation - was seen in [place] by PT for L UE pain, stiffness and cording. Pt transfered her care this week to OT at [facility]  that is closer to home. Pt with increase pain in in L breast , axilla into upper arm and elbow to forearm - pt did not had any therapy for about 3 wks - pt show decrease L shoulder ABD more than Flexion -and with scar tissue and fibrosis at lateral L breast and upper breast - tendereness. Pt await compression sleeve for L UE - will measure circumference next session - breast same size and do not appear to have lymphedema in quadrant- but would recommend jovipak breast  pad to use for fibrosis in lateral and upper L breast. Pt responded great in pain and soft tissue with mini massager use - show increase AROM in all planes this session - pt to hold off on theraband HEP except scapula retraction and shoulder ext - focus on AAROM for L shoulder lfexion and ABD on  wall , and supine using golf club for flexion , ext rotation and AAROM for ABD - pt cont HEP for few  days and follow up early next week.   Pt has limitations in Left shoulder AROM by increased pain from cording.  She continues to complain of axillary soreness and soreness at left lateral breast with small seroma present that will not be addressed with draining.  She is awaiting a compression sleeve.  She will benefit from continued OT to address cording, ROM limtations strenght to increase use of L dominant hand in ADl's and IADl's    OT Occupational Profile and History Problem Focused Assessment - Including review of records relating to presenting problem    Occupational performance deficits (Please refer to evaluation for details): ADL's;IADL's;Rest and Sleep;Work;Play;Leisure;Social Participation    Body Structure / Function / Physical Skills ADL;Decreased knowledge of precautions;Skin integrity;Flexibility;Scar mobility;IADL;ROM;Edema;UE functional use;Pain    Rehab Potential Good    Clinical Decision Making Limited treatment options, no task modification necessary    Comorbidities Affecting Occupational Performance: None    Modification or Assistance to Complete Evaluation  No modification of tasks or assist necessary to complete eval    OT Frequency --   1-2 x wk depends on progress   OT Duration 8 weeks    OT Treatment/Interventions Self-care/ADL training;Manual lymph drainage;Therapeutic activities;Contrast Bath;Therapeutic exercise;Scar mobilization;Passive range of motion;Manual Therapy;Patient/family education    Consulted and Agree with Plan of Care Patient             Patient will benefit from skilled therapeutic intervention in order to improve the following deficits and impairments:   Body Structure / Function / Physical Skills: ADL, Decreased knowledge of precautions, Skin integrity, Flexibility, Scar mobility, IADL, ROM, Edema, UE functional use, Pain       Visit  Diagnosis: Disorder of the skin and subcutaneous tissue related to radiation, unspecified - Plan: Ot plan of care cert/re-cert  Stiffness of left shoulder, not elsewhere classified - Plan: Ot plan of care cert/re-cert  Malignant neoplasm of upper-outer quadrant of left breast in female, estrogen receptor positive ([place]) - Plan: Ot plan of care cert/re-cert  Muscle weakness (generalized) - Plan: Ot plan of care cert/re-cert  Acute pain of left shoulder - Plan: Ot plan of care cert/re-cert  Aftercare following surgery for neoplasm - Plan: Ot plan of care cert/re-cert    Problem List Patient Active Problem List   Diagnosis Date Noted   Prediabetes 03/27/2021   Class 2 severe obesity due to excess calories with serious comorbidity and body mass index (BMI) of 36.0 to 36.9 in adult ([place]) 03/27/2021   B12 deficiency anemia 01/19/2021   PVC (premature ventricular contraction) 01/17/2021   Iron deficiency anemia 01/17/2021   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 [place], [address] Phone: [phone]  Fax:  [phone]  Name: Celestia Duva MRN: [id number] Date of Birth: [birth year]

## 2021-07-18 ENCOUNTER — Encounter: Payer: Self-pay | Admitting: Internal Medicine

## 2021-07-18 ENCOUNTER — Ambulatory Visit (INDEPENDENT_AMBULATORY_CARE_PROVIDER_SITE_OTHER): Payer: BC Managed Care – PPO | Admitting: Internal Medicine

## 2021-07-18 ENCOUNTER — Encounter: Payer: BC Managed Care – PPO | Admitting: Rehabilitation

## 2021-07-18 ENCOUNTER — Other Ambulatory Visit: Payer: Self-pay

## 2021-07-18 VITALS — BP 134/71 | HR 88 | Ht 66.0 in | Wt 210.4 lb

## 2021-07-18 DIAGNOSIS — M2550 Pain in unspecified joint: Secondary | ICD-10-CM

## 2021-07-18 DIAGNOSIS — R5383 Other fatigue: Secondary | ICD-10-CM

## 2021-07-18 DIAGNOSIS — M791 Myalgia, unspecified site: Secondary | ICD-10-CM | POA: Diagnosis not present

## 2021-07-18 MED ORDER — PREDNISONE 10 MG PO TABS: ORAL_TABLET | ORAL | 0 refills | Status: DC

## 2021-07-18 NOTE — Progress Notes (Signed)
Subjective:    Patient ID: [name], female    DOB: 05-Dec-1970, 50 y.o.   MRN: [id number]  HPI  Patient presents to clinic today with complaint of fatigue and body aches.  She reports this started 3 weeks ago after getting sick with a viral URI. Covid test was negative. She is unsure if she had the flu. She denies headache, runny nose, nasal congestion, ear pain, sore throat, cough, shortness of breath, chest pain, nausea, vomiting, diarrhea, fever or chills. She has not taken anything OTC for her symptoms.   Review of Systems     Past Medical History:  Diagnosis Date   Allergy    Anemia    present for many years, mild, stable   Anxiety    Breast cancer ([place])    Breast cancer ([place]) 12/18/2020   Dysrhythmia    PAC's on flecanide (did not tolerate beta blockers)   Family history of breast cancer 01/20/2021   Family history of prostate cancer 01/20/2021   Frequent headaches    HSV infection    fever blisters   Hypertension    Hypothyroidism     Current Outpatient Medications  Medication Sig Dispense Refill   ALPRAZolam (XANAX) 0.25 MG tablet Take 1 tablet (0.25 mg total) by mouth 2 (two) times daily as needed. 20 tablet 0   atorvastatin (LIPITOR) 10 MG tablet Take 1 tablet (10 mg total) by mouth daily. 90 tablet 1   butalbital-acetaminophen-caffeine (FIORICET) 50-325-40 MG tablet TAKE 1 TABLET BY MOUTH EVERY 6 (SIX) HOURS AS NEEDED FOR HEADACHE. 20 tablet 2   cyclobenzaprine (FLEXERIL) 10 MG tablet Take 1 tablet (10 mg total) by mouth daily as needed (migraines). 15 tablet 0   flecainide (TAMBOCOR) 50 MG tablet TAKE 1 TABLET BY MOUTH TWICE A DAY (Patient taking differently: Take 50 mg by mouth 2 (two) times daily.) 180 tablet 1   FLUoxetine (PROZAC) 10 MG capsule TAKE 1 CAPSULE BY MOUTH EVERY DAY 90 capsule 1   fluticasone (FLONASE) 50 MCG/ACT nasal spray SPRAY 2 SPRAYS INTO EACH NOSTRIL EVERY DAY (Patient taking differently: Place 2 sprays into both nostrils daily as  needed for allergies.) 48 mL 0   gabapentin (NEURONTIN) 100 MG capsule Take 1 capsule (100 mg total) by mouth 3 (three) times daily. (Patient taking differently: Take 100 mg by mouth 3 (three) times daily as needed.) 270 capsule 0   ibuprofen (ADVIL) 800 MG tablet Take 1 tablet (800 mg total) by mouth every 8 (eight) hours as needed. 30 tablet 0   levocetirizine (XYZAL) 5 MG tablet TAKE 1 TABLET BY MOUTH EVERY DAY IN THE EVENING 90 tablet 3   levothyroxine (SYNTHROID) 112 MCG tablet Take 1 tablet (112 mcg total) by mouth daily before breakfast. 90 tablet 2   losartan (COZAAR) 25 MG tablet TAKE 1 TABLET BY MOUTH EVERY DAY (Patient taking differently: Take 25 mg by mouth at bedtime.) 90 tablet 3   oxyCODONE (OXY IR/ROXICODONE) 5 MG immediate release tablet Take 1 tablet (5 mg total) by mouth every 6 (six) hours as needed for severe pain. 30 tablet 0   predniSONE (DELTASONE) 10 MG tablet Take 6 tabs on day 1, 5 tabs on day 2, 4 tabs on day 3, 3 tabs on day 4, 2 tabs on day 5, 1 tab on day 6 21 tablet 0   rizatriptan (MAXALT-MLT) 10 MG disintegrating tablet Take 1 tablet (10 mg total) by mouth as needed for migraine. May repeat in 2 hours if needed  10 tablet 1   SUMAtriptan (IMITREX) 25 MG tablet TAKE 1 TABLET (25 MG TOTAL) BY MOUTH EVERY 2 (TWO) HOURS AS NEEDED FOR MIGRAINE. MAY REPEAT IN 2 HOURS IF HEADACHE PERSISTS OR RECURS. 10 tablet 0   tamoxifen (NOLVADEX) 20 MG tablet TAKE 1 TABLET BY MOUTH EVERY DAY 90 tablet 1   traMADol (ULTRAM) 50 MG tablet Take 1 tablet (50 mg total) by mouth every 12 (twelve) hours as needed. 15 tablet 0   valACYclovir (VALTREX) 500 MG tablet TAKE 1 TABLET (500 MG TOTAL) BY MOUTH DAILY AS NEEDED (HERPES SIMPLEX). 90 tablet 0   No current facility-administered medications for this visit.    Allergies  Allergen Reactions   Ciprofloxacin     anxiety   Keflex [Cephalexin] Hives   Trazodone And Nefazodone Other (See Comments)    nightmares    Family History  Problem  Relation Age of Onset   Depression Mother    Breast cancer Mother 35   Hyperlipidemia Father    Prostate cancer Father 37    Social History   Socioeconomic History   Marital status: Single    Spouse name: Not on file   Number of children: 1   Years of education: Not on file   Highest education level: Not on file  Occupational History   Not on file  Tobacco Use   Smoking status: Never   Smokeless tobacco: Never  Vaping Use   Vaping Use: Never used  Substance and Sexual Activity   Alcohol use: Yes    Comment: occasional   Drug use: No   Sexual activity: Yes    Partners: Male    Birth control/protection: Pill  Other Topics Concern   Not on file  Social History Narrative   Not on file   Social Determinants of Health   Financial Resource Strain: Not on file  Food Insecurity: Not on file  Transportation Needs: Not on file  Physical Activity: Not on file  Stress: Not on file  Social Connections: Not on file  Intimate Partner Violence: Not At Risk   Fear of Current or Ex-Partner: No   Emotionally Abused: No   Physically Abused: No   Sexually Abused: No     Constitutional: Patient reports fatigue.  Denies fever, malaise, headache or abrupt weight changes.  HEENT: Denies eye pain, eye redness, ear pain, ringing in the ears, wax buildup, runny nose, nasal congestion, bloody nose, or sore throat. Respiratory: Denies difficulty breathing, shortness of breath, cough or sputum production.   Cardiovascular: Denies chest pain, chest tightness, palpitations or swelling in the hands or feet.  Gastrointestinal: Denies abdominal pain, bloating, constipation, diarrhea or blood in the stool.  GU: Denies urgency, frequency, pain with urination, burning sensation, blood in urine, odor or discharge. Musculoskeletal: Denies decrease in range of motion, difficulty with gait, muscle pain or joint pain and swelling.  Skin: Denies redness, rashes, lesions or ulcercations.  Neurological:  Denies dizziness, difficulty with memory, difficulty with speech or problems with balance and coordination.    No other specific complaints in a complete review of systems (except as listed in HPI above).  Objective:   Physical Exam  BP 134/71   Pulse 88   Ht _0  (1.676 m)   Wt 210 lb 6.4 oz (95.4 kg)   SpO2 99%   BMI 33.96 kg/m   Wt Readings from Last 3 Encounters:  05/13/21 217 lb (98.4 kg)  03/27/21 219 lb 9.6 oz (99.6 kg)  02/21/21 220  lb (99.8 kg)    General: Appears her stated age, obese, in NAD. Skin: Warm, dry and intact. No rashes noted. HEENT: Head: normal shape and size; Eyes: EOMs intact;  Cardiovascular: Normal rate. Pulmonary/Chest: Normal effort. Musculoskeletal: No difficulty with gait.  Neurological: Alert and oriented.   BMET    Component Value Date/Time   NA 135 06/20/2021 2314   NA 143 01/26/2020 1600   K 3.7 06/23/2021 1529   CL 97 (L) 06/20/2021 2314   CO2 19 (L) 06/20/2021 2314   GLUCOSE 258 (H) 06/20/2021 2314   BUN 15 06/20/2021 2314   BUN 11 01/26/2020 1600   CREATININE 1.19 (H) 06/20/2021 2314   CREATININE 1.10 (H) 05/13/2021 0940   CALCIUM 9.1 06/20/2021 2314   GFRNONAA 56 (L) 06/20/2021 2314   GFRNONAA >60 05/13/2021 0940   GFRAA >60 02/25/2020 2231    Lipid Panel     Component Value Date/Time   CHOL 194 06/23/2021 1554   TRIG 174 (H) 06/23/2021 1554   HDL 40 (L) 06/23/2021 1554   CHOLHDL 4.9 06/23/2021 1554   VLDL 18.8 12/21/2019 1204   LDLCALC 125 (H) 06/23/2021 1554    CBC    Component Value Date/Time   WBC 8.1 06/20/2021 2314   RBC 3.97 06/20/2021 2314   HGB 12.0 06/20/2021 2314   HGB 12.2 05/13/2021 0940   HGB 10.1 (L) 04/25/2020 1154   HCT 34.2 (L) 06/20/2021 2314   HCT 31.6 (L) 04/25/2020 1154   PLT 303 06/20/2021 2314   PLT 197 05/13/2021 0940   PLT 261 04/25/2020 1154   MCV 86.1 06/20/2021 2314   MCV 80 04/25/2020 1154   MCH 30.2 06/20/2021 2314   MCHC 35.1 06/20/2021 2314   RDW 13.0 06/20/2021 2314    RDW 16.8 (H) 04/25/2020 1154   LYMPHSABS 0.9 05/13/2021 0940   LYMPHSABS 1.4 04/25/2020 1154   MONOABS 0.4 05/13/2021 0940   EOSABS 0.1 05/13/2021 0940   EOSABS 0.0 04/25/2020 1154   BASOSABS 0.0 05/13/2021 0940   BASOSABS 0.1 04/25/2020 1154    Hgb A1C Lab Results  Component Value Date   HGBA1C 5.4 03/27/2021           Assessment & Plan:   Fatigue and Body Aches:  Afebrile today Will check CBC, CMET, ESR, CRP and ANA RX for Pred Taper for symptom management  Will follow up after labs are back with further recommendation and treatment plan  Webb Silversmith, NP This visit occurred during the SARS-CoV-2 public health emergency.  Safety protocols were in place, including screening questions prior to the visit, additional usage of staff PPE, and extensive cleaning of exam room while observing appropriate contact time as indicated for disinfecting solutions. ]

## 2021-07-19 ENCOUNTER — Encounter: Payer: Self-pay | Admitting: Internal Medicine

## 2021-07-20 ENCOUNTER — Encounter: Payer: Self-pay | Admitting: Internal Medicine

## 2021-07-20 NOTE — Patient Instructions (Signed)
Muscle Pain, Adult ?Muscle pain, also called myalgia, is a condition in which a person has pain in one or more muscles in the body. Muscle pain may be mild, moderate, or severe. It may feel sharp, achy, or burning. In most cases, the pain lasts only a short time and goes away without treatment. ?Muscle pain can result from using muscles in a new or different way or after a period of inactivity. It is normal to feel some muscle pain after starting an exercise program. Muscles that have not been used often will be sore at first. ?What are the causes? ?This condition is caused by using muscles in a new or different way after a period of inactivity. Other causes may include: ?Overuse or muscle strain, especially if you are not in shape. This is the most common cause of muscle pain. ?Injury or bruising. ?Infectious diseases, including diseases caused by viruses, such as the flu (influenza). ?Fibromyalgia.This is a long-term, or chronic, condition that causes muscle tenderness, tiredness (fatigue), and headache. ?Autoimmune or rheumatologic diseases. These are conditions, such as lupus, in which the body's defense system (immunesystem) attacks areas in the body. ?Certain medicines, including ACE inhibitors and statins. ?What are the signs or symptoms? ?The main symptom of this condition is sore or painful muscles, including during activity and when stretching. You may also have slight swelling. ?How is this diagnosed? ?This condition is diagnosed with a physical exam. Your health care provider will ask questions about your pain and when it began. If you have not had muscle pain for very long, your health care provider may want to wait before doing much testing. If your muscle pain has lasted a long time, tests may be done right away. In some cases, this may include tests to rule out certain conditions or illnesses. ?How is this treated? ?Treatment for this condition depends on the cause. Home care is often enough to  relieve muscle pain. Your health care provider may also prescribe NSAIDs, such as ibuprofen. ?Follow these instructions at home: ?Medicines ?Take over-the-counter and prescription medicines only as told by your health care provider. ?Ask your health care provider if the medicine prescribed to you requires you to avoid driving or using machinery. ?Managing pain, swelling, and discomfort ?  ?If directed, put ice on the painful area. To do this: ?Put ice in a plastic bag. ?Place a towel between your skin and the bag. ?Leave the ice on for 20 minutes, 2-3 times a day. ?For the first 2 days of muscle soreness, or if there is swelling: ?Do not soak in hot baths. ?Do not use a hot tub, steam room, sauna, heating pad, or other heat source. ?After 48-72 hours, you may alternate between applying ice and applying heat as told by your health care provider. If directed, apply heat to the affected area as often as told by your health care provider. Use the heat source that your health care provider recommends, such as a moist heat pack or a heating pad. ?Place a towel between your skin and the heat source. ?Leave the heat on for 20-30 minutes. ?Remove the heat if your skin turns bright red. This is especially important if you are unable to feel pain, heat, or cold. You may have a greater risk of getting burned. ?If you have an injury, raise (elevate) the injured area above the level of your heart while you are sitting or lying down. ?Activity ? ?If overuse is causing your muscle pain: ?Slow down your activities   until the pain goes away. ?Do regular, gentle exercises if you are not usually active. ?Warm up before exercising. Stretch before and after exercising. This can help lower the risk of muscle pain. ?Do not continue working out if the pain is severe. Severe pain could mean that you have injured a muscle. ?Do not lift anything that is heavier than 5-10 lb (2.3-4.5 kg), or the limit that you are told, until your health care  provider says that it is safe. ?Return to your normal activities as told by your health care provider. Ask your health care provider what activities are safe for you. ?General instructions ?Do not use any products that contain nicotine or tobacco, such as cigarettes, e-cigarettes, and chewing tobacco. These can delay healing. If you need help quitting, ask your health care provider. ?Keep all follow-up visits as told by your health care provider. This is important. ?Contact a health care provider if you have: ?Muscle pain that gets worse and medicines do not help. ?Muscle pain that lasts longer than 3 days. ?A rash or fever along with muscle pain. ?Muscle pain after a tick bite. ?Muscle pain while working out, even though you are in good physical condition. ?Redness, soreness, or swelling along with muscle pain. ?Muscle pain after starting a new medicine or changing the dose of a medicine. ?Get help right away if you have: ?Trouble breathing. ?Trouble swallowing. ?Muscle pain along with a stiff neck, fever, and vomiting. ?Severe muscle weakness or you cannot move part of your body. ?These symptoms may represent a serious problem that is an emergency. Do not wait to see if the symptoms will go away. Get medical help right away. Call your local emergency services (911 in the [place] Do not drive yourself to the hospital. ?Summary ?Muscle pain usually lasts only a short time and goes away without treatment. ?This condition is caused by using muscles in a new or different way after a period of inactivity. ?If your muscle pain lasts longer than 3 days, tell your health care provider. ?This information is not intended to replace advice given to you by your health care provider. Make sure you discuss any questions you have with your health care provider. ?Document Revised: 03/14/2021 Document Reviewed: 03/14/2021 ?Elsevier Patient Education ? 2022 Elsevier Inc[employer] ? ?

## 2021-07-21 ENCOUNTER — Ambulatory Visit: Payer: BC Managed Care – PPO | Admitting: Occupational Therapy

## 2021-07-21 LAB — CBC
HCT: 35.4 % (ref 35.0–45.0)
Hemoglobin: 12.2 g/dL (ref 11.7–15.5)
MCH: 30.4 pg (ref 27.0–33.0)
MCHC: 34.5 g/dL (ref 32.0–36.0)
MCV: 88.3 fL (ref 80.0–100.0)
MPV: 10 fL (ref 7.5–12.5)
Platelets: 204 10*3/uL (ref 140–400)
RBC: 4.01 10*6/uL (ref 3.80–5.10)
RDW: 13.9 % (ref 11.0–15.0)
WBC: 3.9 10*3/uL (ref 3.8–10.8)

## 2021-07-21 LAB — COMPLETE METABOLIC PANEL WITH GFR
AG Ratio: 1.6 (calc) (ref 1.0–2.5)
ALT: 23 U/L (ref 6–29)
AST: 28 U/L (ref 10–35)
Albumin: 4.1 g/dL (ref 3.6–5.1)
Alkaline phosphatase (APISO): 126 U/L — ABNORMAL HIGH (ref 31–125)
BUN/Creatinine Ratio: 10 (calc) (ref 6–22)
BUN: 14 mg/dL (ref 7–25)
CO2: 24 mmol/L (ref 20–32)
Calcium: 9.1 mg/dL (ref 8.6–10.2)
Chloride: 104 mmol/L (ref 98–110)
Creat: 1.46 mg/dL — ABNORMAL HIGH (ref 0.50–0.99)
Globulin: 2.5 g/dL (calc) (ref 1.9–3.7)
Glucose, Bld: 98 mg/dL (ref 65–139)
Potassium: 3.6 mmol/L (ref 3.5–5.3)
Sodium: 139 mmol/L (ref 135–146)
Total Bilirubin: 0.2 mg/dL (ref 0.2–1.2)
Total Protein: 6.6 g/dL (ref 6.1–8.1)
eGFR: 44 mL/min/{1.73_m2} — ABNORMAL LOW (ref >=60)

## 2021-07-21 LAB — SEDIMENTATION RATE: Sed Rate: 6 mm/h (ref 0–20)

## 2021-07-21 LAB — C-REACTIVE PROTEIN: CRP: 4.9 mg/L (ref <8.0)

## 2021-07-21 LAB — ANA: Anti Nuclear Antibody (ANA): NEGATIVE

## 2021-07-23 ENCOUNTER — Encounter: Payer: BC Managed Care – PPO | Admitting: Physical Therapy

## 2021-07-24 ENCOUNTER — Ambulatory Visit: Payer: BC Managed Care – PPO | Admitting: Occupational Therapy

## 2021-07-24 DIAGNOSIS — M6281 Muscle weakness (generalized): Secondary | ICD-10-CM

## 2021-07-24 DIAGNOSIS — Z483 Aftercare following surgery for neoplasm: Secondary | ICD-10-CM

## 2021-07-24 DIAGNOSIS — Z170 Estrogen receptor positive status [ER+]: Secondary | ICD-10-CM

## 2021-07-24 DIAGNOSIS — L599 Disorder of the skin and subcutaneous tissue related to radiation, unspecified: Secondary | ICD-10-CM | POA: Diagnosis not present

## 2021-07-24 DIAGNOSIS — M25612 Stiffness of left shoulder, not elsewhere classified: Secondary | ICD-10-CM

## 2021-07-24 NOTE — Therapy (Signed)
[facility] [address], [place], [address] Phone: [phone]   Fax:  [phone]  Occupational Therapy Treatment  Patient Details  Name: [name] MRN: [id number] Date of Birth: 01-03-1971 Referring Provider (OT): Dara Lords   Encounter Date: 07/24/2021   OT End of Session - 07/24/21 1216     Visit Number 2    Number of Visits 12    Date for OT Re-Evaluation 09/11/21    OT Start Time 0740    OT Stop Time 0820    OT Time Calculation (min) 40 min    Activity Tolerance Patient tolerated treatment well    Behavior During Therapy [facility] for tasks assessed/performed             Past Medical History:  Diagnosis Date   Allergy    Anemia    present for many years, mild, stable   Anxiety    Breast cancer ([place])    Breast cancer ([place]) 12/18/2020   Dysrhythmia    PAC's on flecanide (did not tolerate beta blockers)   Family history of breast cancer 01/20/2021   Family history of prostate cancer 01/20/2021   Frequent headaches    HSV infection    fever blisters   Hypertension    Hypothyroidism     Past Surgical History:  Procedure Laterality Date   APPENDECTOMY  2013   BREAST LUMPECTOMY WITH RADIOACTIVE SEED LOCALIZATION Left 12/18/2020   Procedure: LEFT BREAST LUMPECTOMY WITH RADIOACTIVE SEED LOCALIZATION;  Surgeon: Erroll Luna, MD;  Location: [facility];  Service: General;  Laterality: Left;   KNEE ARTHROSCOPY Right    lapband  2013   SENTINEL NODE BIOPSY Left 01/07/2021   Procedure: LEFT SENTINEL NODE BIOPSY;  Surgeon: Erroll Luna, MD;  Location: [facility];  Service: General;  Laterality: Left;   TONSILLECTOMY  2012    There were no vitals filed for this visit.   Subjective Assessment - 07/24/21 1100     Subjective  My pain was better about 12 hrs after I left here -but then increased again - did get my compression sleeve that the other therapist wanted me to get - wore it  all day yesterdayand felt okay -but when I took it off - it really hurt into my pect    Pertinent History An MRI in September showed a seroma that they are just watching, and don't think it needs to be drained.       Pertinent History left beast cancer with lumpectomy and a second surgery with 2 nodes removed, Pt had 28 treatments of radiation, Pt said the she had some type of reaction after surgery and her left breast swelled severly.  The breast and L arm pai. It occurs when I use my arm more.    Patient Stated Goals I want the pain better so I can use my arm like befort    Pain Score 6     Pain Location Arm    Pain Orientation Left    Pain Descriptors / Indicators Aching;Tender;Tightness    Pain Type Surgical pain    Pain Onset More than a month ago    Pain Frequency Intermittent                 LYMPHEDEMA/ONCOLOGY QUESTIONNAIRE - 07/24/21 0001       Right Upper Extremity Lymphedema   15 cm Proximal to Olecranon Process 37.5 cm    10 cm Proximal to Olecranon Process  35 cm    Olecranon Process 27 cm    15 cm Proximal to Ulnar Styloid Process 25 cm    Just Proximal to Ulnar Styloid Process 17 cm      Left Upper Extremity Lymphedema   15 cm Proximal to Olecranon Process 39 cm    10 cm Proximal to Olecranon Process 37.4 cm    Olecranon Process 27 cm    15 cm Proximal to Ulnar Styloid Process 24.5 cm    Just Proximal to Ulnar Styloid Process 17.4 cm    Across Hand at [employer] 19.5 cm                 Mini massager use on l upper  L breast  and axilla scar and fibrosis  - great response  as well as volar upper arm over trigger points -and in axilla  Done graston tool nr 2 on anterior upper arm and forearm  light sweeping -  Less pain - with most of pain this date over pect with shoulder ABD more than flexion- progress well in session And recommend pt to get mini massager to use at home and jovipak breast pad - unilateral breast pad under bra or camisole   Change  ext rotation to side lying with hand behind head and elbow AROM ext rotation- stop when feeling pull - less pain at end session  Pt HEP review and can cont to wear compression sleeve                 OT Education - 07/24/21 1216     Education Details progress and HEP changes    Person(s) Educated Patient    Methods Explanation;Demonstration;Tactile cues;Verbal cues    Comprehension Returned demonstration;Verbalized understanding;Verbal cues required                 OT Long Term Goals - 07/17/21 1920       OT LONG TERM GOAL #1   Title Pt will report the pain in her left upper quadrant is reduced to 2/10 at the most    Baseline pain in L breast, axilla and into arm 6/10 - limiing her AROM and use of L arm    Time 4    Period Weeks    Status New    Target Date 08/14/21      OT LONG TERM GOAL #2   Title Pt will be independent in a home exericse program  to decrease scar tissue , pain and increase  left shoudler AROM and strength    Baseline have some knowledge using bands but still cradle when pain - do some scar massage - pain still 6/10 and tender breast at scar , upper L breast and axilla into arm    Time 6    Period Weeks    Status New    Target Date 08/28/21      OT LONG TERM GOAL #3   Title Pt will report  knowledgable about lymphedem risk reduction practices    Baseline review this date with pt - cont to need into    Time 4    Period Weeks    Status New    Target Date 08/14/21                   Plan - 07/24/21 1217     Clinical Impression Statement Pt had L lumpectomy in April 22- and radiation - was seen in [place] by PT for L  UE pain, stiffness and cording. Pt transfered her care this week to OT at [facility]  that is closer to home. Pt with increase pain in in L breast , pect, axilla into upper arm and elbow to forearm - pt did not had any therapy for about 3 wks befort last weeks appt- pt show decrease L shoulder ABD more than  Flexion -and with scar tissue and fibrosis at lateral L breast and upper breast - tendereness.This date pt show increase L shoulder flexion and ABD compare to last week -and in session again -decrease pain insession - pt arrive with her [employer] soft sleeve and gauntlet - fit well - but she report had increase pain when she removed it last night . Will contact [employer] for fund assistance for jovipak breast  pad to use for fibrosis in lateral and upper L breast. Pt responded great in pain and soft tissue with mini massager use on chest, under arm and upper arm/forearm - showed increase AROM in all planes this session - ABD 120 and flexion 140 - pt to hold off on theraband HEP except scapula retraction and shoulder ext - focus on AAROM for L shoulder lfexion and ABD on wall , and supine using golf club for flexion , ext rotation and AAROM for ABD - change ext rotations stretch to sidelying on mat -  pt cont HEP.   Pt has limitations in Left shoulder AROM by increased pain from cording and soft tissue more than cording now.  She continues to complain of  pect, breast , axillary soreness and soreness at left upper arm into forearm.   She will benefit from continued OT to address cording, ROM limtations strenght to increase use of L dominant hand in ADl's and IADl's    OT Occupational Profile and History Problem Focused Assessment - Including review of records relating to presenting problem    Occupational performance deficits (Please refer to evaluation for details): ADL's;IADL's;Rest and Sleep;Work;Play;Leisure;Social Participation    Body Structure / Function / Physical Skills ADL;Decreased knowledge of precautions;Skin integrity;Flexibility;Scar mobility;IADL;ROM;Edema;UE functional use;Pain    Rehab Potential Good    Clinical Decision Making Limited treatment options, no task modification necessary    Comorbidities Affecting Occupational Performance: None    Modification or Assistance to  Complete Evaluation  No modification of tasks or assist necessary to complete eval    OT Frequency --   1-2  x wk depending on Progress   OT Duration 8 weeks    OT Treatment/Interventions Self-care/ADL training;Manual lymph drainage;Therapeutic activities;Contrast Bath;Therapeutic exercise;Scar mobilization;Passive range of motion;Manual Therapy;Patient/family education    Consulted and Agree with Plan of Care Patient             Patient will benefit from skilled therapeutic intervention in order to improve the following deficits and impairments:   Body Structure / Function / Physical Skills: ADL, Decreased knowledge of precautions, Skin integrity, Flexibility, Scar mobility, IADL, ROM, Edema, UE functional use, Pain       Visit Diagnosis: Disorder of the skin and subcutaneous tissue related to radiation, unspecified  Stiffness of left shoulder, not elsewhere classified  Malignant neoplasm of upper-outer quadrant of left breast in female, estrogen receptor positive (HCC)  Muscle weakness (generalized)  Aftercare following surgery for neoplasm    Problem List Patient Active Problem List   Diagnosis Date Noted   Prediabetes 03/27/2021   Class 2 severe obesity due to excess calories with serious comorbidity and body mass index (BMI)  of 36.0 to 36.9 in adult [facility]) 03/27/2021   B12 deficiency anemia 01/19/2021   PVC (premature ventricular contraction) 01/17/2021   Iron deficiency anemia 01/17/2021   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 [place], [address] Phone: [phone]    Fax:  [phone]  Name: [name] MRN: [id number] Date of Birth: Jun 07, 1971

## 2021-07-28 ENCOUNTER — Ambulatory Visit: Payer: BC Managed Care – PPO | Admitting: Occupational Therapy

## 2021-07-28 DIAGNOSIS — M6281 Muscle weakness (generalized): Secondary | ICD-10-CM

## 2021-07-28 DIAGNOSIS — Z483 Aftercare following surgery for neoplasm: Secondary | ICD-10-CM

## 2021-07-28 DIAGNOSIS — M25612 Stiffness of left shoulder, not elsewhere classified: Secondary | ICD-10-CM

## 2021-07-28 DIAGNOSIS — L599 Disorder of the skin and subcutaneous tissue related to radiation, unspecified: Secondary | ICD-10-CM | POA: Diagnosis not present

## 2021-07-28 NOTE — Therapy (Signed)
[facility] [address] [place], [address] Phone: [phone]   Fax:  [phone]  Occupational Therapy Treatment  Patient Details  Name: [name] MRN: [id number] Date of Birth: 21-Oct-1970 Referring Provider (OT): Dara Lords   Encounter Date: 07/28/2021   OT End of Session - 07/28/21 1355     Visit Number 3    Number of Visits 12    Date for OT Re-Evaluation 09/11/21    OT Start Time 0740    OT Stop Time 0818    OT Time Calculation (min) 38 min    Activity Tolerance Patient tolerated treatment well    Behavior During Therapy [facility] for tasks assessed/performed             Past Medical History:  Diagnosis Date   Allergy    Anemia    present for many years, mild, stable   Anxiety    Breast cancer ([place])    Breast cancer ([place]) 12/18/2020   Dysrhythmia    PAC's on flecanide (did not tolerate beta blockers)   Family history of breast cancer 01/20/2021   Family history of prostate cancer 01/20/2021   Frequent headaches    HSV infection    fever blisters   Hypertension    Hypothyroidism     Past Surgical History:  Procedure Laterality Date   APPENDECTOMY  2013   BREAST LUMPECTOMY WITH RADIOACTIVE SEED LOCALIZATION Left 12/18/2020   Procedure: LEFT BREAST LUMPECTOMY WITH RADIOACTIVE SEED LOCALIZATION;  Surgeon: Erroll Luna, MD;  Location: [place];  Service: General;  Laterality: Left;   KNEE ARTHROSCOPY Right    lapband  2013   SENTINEL NODE BIOPSY Left 01/07/2021   Procedure: LEFT SENTINEL NODE BIOPSY;  Surgeon: Erroll Luna, MD;  Location: [place];  Service: General;  Laterality: Left;   TONSILLECTOMY  2012    There were no vitals filed for this visit.   Subjective Assessment - 07/28/21 0829     Subjective  Doing better- my pain stays better for about 2 days and then return in my arm - it is under my armpit and into my upper arm to forearm - i need to cancel -need  to see my mom she has metastasis to spine from her cancer    Pertinent History An MRI in September showed a seroma that they are just watching, and don't think it needs to be drained.       Pertinent History left beast cancer with lumpectomy and a second surgery with 2 nodes removed, Pt had 28 treatments of radiation, Pt said the she had some type of reaction after surgery and her left breast swelled severly.  The breast and L arm pai. It occurs when I use my arm more.    Patient Stated Goals I want the pain better so I can use my arm like befort    Currently in Pain? Yes    Pain Score 3     Pain Location Arm    Pain Orientation Left    Pain Descriptors / Indicators Aching;Tender;Tightness    Pain Type Surgical pain    Pain Onset More than a month ago    Pain Frequency Intermittent                     Mini massager use on upper  L breast  and axilla scar and fibrosis  - great response   more when at end of session  in R side lying - with AAROM and AROM external rotation  Pt had afterwards less tenderness and cording in volar  upper arm  and into volar forearm distal to elbow a  Done graston tool nr 2 on anterior upper arm and forearm  light sweeping at [facility]-  Less pain  lateral in session - with most of pain this date over pect and upper breast towards nipple - with  shoulder ABD more than flexion- progress well in session and done better with soft tissue to breast and axilla  Recommend pt to get mini massager to use at home and jovipak breast pad - unilateral breast pad under bra or camisole - need to look for funding assistance - pt insurance not covering  Cont ext rotation to side lying with hand behind head and elbow AROM ext rotation- stop when feeling pull - less pain at end session - further AROM  Pt HEP review and can cont to wear compression sleeve if needed                OT Education - 07/28/21 1354     Education Details progress and HEP changes    Person(s)  Educated Patient    Methods Explanation;Demonstration;Tactile cues;Verbal cues    Comprehension Returned demonstration;Verbalized understanding;Verbal cues required                 OT Long Term Goals - 07/17/21 1920       OT LONG TERM GOAL #1   Title Pt will report the pain in her left upper quadrant is reduced to 2/10 at the most    Baseline pain in L breast, axilla and into arm 6/10 - limiing her AROM and use of L arm    Time 4    Period Weeks    Status New    Target Date 08/14/21      OT LONG TERM GOAL #2   Title Pt will be independent in a home exericse program  to decrease scar tissue , pain and increase  left shoudler AROM and strength    Baseline have some knowledge using bands but still cradle when pain - do some scar massage - pain still 6/10 and tender breast at scar , upper L breast and axilla into arm    Time 6    Period Weeks    Status New    Target Date 08/28/21      OT LONG TERM GOAL #3   Title Pt will report  knowledgable about lymphedem risk reduction practices    Baseline review this date with pt - cont to need into    Time 4    Period Weeks    Status New    Target Date 08/14/21                   Plan - 07/28/21 1355     OT Occupational Profile and History Problem Focused Assessment - Including review of records relating to presenting problem    Occupational performance deficits (Please refer to evaluation for details): ADL's;IADL's;Rest and Sleep;Work;Play;Leisure;Social Participation    Body Structure / Function / Physical Skills ADL;Decreased knowledge of precautions;Skin integrity;Flexibility;Scar mobility;IADL;ROM;Edema;UE functional use;Pain    Rehab Potential Good    Clinical Decision Making Limited treatment options, no task modification necessary    Comorbidities Affecting Occupational Performance: None    Modification or Assistance to Complete Evaluation  No modification of tasks or assist necessary to complete eval    OT  Frequency --   1-2 x wk as progress   OT Duration 8 weeks    OT Treatment/Interventions Self-care/ADL training;Manual lymph drainage;Therapeutic activities;Contrast Bath;Therapeutic exercise;Scar mobilization;Passive range of motion;Manual Therapy;Patient/family education    Consulted and Agree with Plan of Care Patient             Patient will benefit from skilled therapeutic intervention in order to improve the following deficits and impairments:   Body Structure / Function / Physical Skills: ADL, Decreased knowledge of precautions, Skin integrity, Flexibility, Scar mobility, IADL, ROM, Edema, UE functional use, Pain       Visit Diagnosis: Disorder of the skin and subcutaneous tissue related to radiation, unspecified  Stiffness of left shoulder, not elsewhere classified  Muscle weakness (generalized)  Aftercare following surgery for neoplasm    Problem List Patient Active Problem List   Diagnosis Date Noted   Prediabetes 03/27/2021   Class 2 severe obesity due to excess calories with serious comorbidity and body mass index (BMI) of 36.0 to 36.9 in adult ([place]) 03/27/2021   B12 deficiency anemia 01/19/2021   PVC (premature ventricular contraction) 01/17/2021   Iron deficiency anemia 01/17/2021   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 [place], [address] Phone: [phone]   Fax:  [phone]  Name: [name] MRN: [id number] Date of Birth: 1970-09-17

## 2021-07-30 ENCOUNTER — Encounter: Payer: BC Managed Care – PPO | Admitting: Occupational Therapy

## 2021-08-04 ENCOUNTER — Ambulatory Visit: Payer: BC Managed Care – PPO | Admitting: Occupational Therapy

## 2021-08-05 ENCOUNTER — Other Ambulatory Visit: Payer: BC Managed Care – PPO

## 2021-08-05 ENCOUNTER — Ambulatory Visit: Payer: BC Managed Care – PPO | Admitting: Hematology

## 2021-08-05 ENCOUNTER — Ambulatory Visit: Payer: BC Managed Care – PPO

## 2021-08-06 ENCOUNTER — Other Ambulatory Visit: Payer: Self-pay | Admitting: Internal Medicine

## 2021-08-06 ENCOUNTER — Other Ambulatory Visit: Payer: Self-pay | Admitting: Nurse Practitioner

## 2021-08-06 ENCOUNTER — Other Ambulatory Visit: Payer: Self-pay | Admitting: Radiation Oncology

## 2021-08-06 MED ORDER — CYCLOBENZAPRINE HCL 10 MG PO TABS: 10.0000 mg | ORAL_TABLET | Freq: Every day | ORAL | 0 refills | Status: DC | PRN

## 2021-08-07 ENCOUNTER — Other Ambulatory Visit: Payer: Self-pay

## 2021-08-07 ENCOUNTER — Encounter: Payer: Self-pay | Admitting: Internal Medicine

## 2021-08-07 ENCOUNTER — Ambulatory Visit: Payer: BC Managed Care – PPO | Attending: Nurse Practitioner | Admitting: Occupational Therapy

## 2021-08-07 DIAGNOSIS — Z483 Aftercare following surgery for neoplasm: Secondary | ICD-10-CM | POA: Insufficient documentation

## 2021-08-07 DIAGNOSIS — M6281 Muscle weakness (generalized): Secondary | ICD-10-CM | POA: Diagnosis present

## 2021-08-07 DIAGNOSIS — L599 Disorder of the skin and subcutaneous tissue related to radiation, unspecified: Secondary | ICD-10-CM | POA: Insufficient documentation

## 2021-08-07 DIAGNOSIS — M25612 Stiffness of left shoulder, not elsewhere classified: Secondary | ICD-10-CM | POA: Diagnosis present

## 2021-08-07 MED ORDER — ALPRAZOLAM 0.25 MG PO TABS: 0.2500 mg | ORAL_TABLET | Freq: Two times a day (BID) | ORAL | 0 refills | Status: DC | PRN

## 2021-08-07 NOTE — Therapy (Signed)
[facility] [address] [place], [address] Phone: [phone]   Fax:  [phone]  Occupational Therapy Treatment  Patient Details  Name: [name] MRN: [id number] Date of Birth: 14-May-1971 Referring Provider (OT): Dara Lords   Encounter Date: 08/07/2021   OT End of Session - 08/07/21 0826     Visit Number 4    Number of Visits 12    Date for OT Re-Evaluation 09/11/21    OT Start Time 0734    OT Stop Time 0816    OT Time Calculation (min) 42 min    Activity Tolerance Patient tolerated treatment well    Behavior During Therapy [facility] for tasks assessed/performed             Past Medical History:  Diagnosis Date   Allergy    Anemia    present for many years, mild, stable   Anxiety    Breast cancer ([place])    Breast cancer ([place]) 12/18/2020   Dysrhythmia    PAC's on flecanide (did not tolerate beta blockers)   Family history of breast cancer 01/20/2021   Family history of prostate cancer 01/20/2021   Frequent headaches    HSV infection    fever blisters   Hypertension    Hypothyroidism     Past Surgical History:  Procedure Laterality Date   APPENDECTOMY  2013   BREAST LUMPECTOMY WITH RADIOACTIVE SEED LOCALIZATION Left 12/18/2020   Procedure: LEFT BREAST LUMPECTOMY WITH RADIOACTIVE SEED LOCALIZATION;  Surgeon: Erroll Luna, MD;  Location: [facility];  Service: General;  Laterality: Left;   KNEE ARTHROSCOPY Right    lapband  2013   SENTINEL NODE BIOPSY Left 01/07/2021   Procedure: LEFT SENTINEL NODE BIOPSY;  Surgeon: Erroll Luna, MD;  Location: [place];  Service: General;  Laterality: Left;   TONSILLECTOMY  2012    There were no vitals filed for this visit.   Subjective Assessment - 08/07/21 0736     Subjective  My arm and breast was really bad yesterday the pain - I drove to [facility] over Thanksgiving and back -and then just holding my arm to my side when it bothers me     Pertinent History An MRI in September showed a seroma that they are just watching, and don't think it needs to be drained.       Pertinent History left beast cancer with lumpectomy and a second surgery with 2 nodes removed, Pt had 28 treatments of radiation, Pt said the she had some type of reaction after surgery and her left breast swelled severly.  The breast and L arm pai. It occurs when I use my arm more.    Patient Stated Goals I want the pain better so I can use my arm like befort    Currently in Pain? Yes    Pain Score 6     Pain Location Breast   L breast and anterior shoulder   Pain Orientation Left    Pain Descriptors / Indicators Tender;Tightness;Aching    Pain Type Surgical pain    Pain Onset More than a month ago    Aggravating Factors  reaching over head and into over head ABD and ext rotation                [facility] OT Assessment - 08/07/21 0001       AROM   Left Shoulder Flexion 135 Degrees    Left Shoulder ABduction 85 Degrees  125 end of session             PT report her pain was yesterday 8/10 in L breast and pect area - just held her arm and breast  Did drove over Thanksgiving to [facility] 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 , 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 , sitting and sleeping   Mini massager use on upper  L breast  lateral scar and fibrosis  - great response   and focus this date also on mini massager and fibrosis from 9 to 3 on L breast - great progress with increase  AAROM and AROM external rotation , ABD and flexion Pt had less cording in volar  upper arm  and  volar forearm  compare to in the past    Recommend pt to get mini massager to use at home and jovipak breast pad - unilateral breast pad under bra or camisole - need to look for funding assistance - pt insurance not covering Did fit pt in the meantime with Komprex foam on L breast to posterior shoulder and chip bag made to use in combination over fibrosis areas   Cont ext rotation to side lying  with hand behind head and elbow AROM ext rotation- stop when feeling pull - less pain at end session - more AROM  Pt HEP review and can cont to wear compression sleeve if needed  Pt can start back and review RTB for scapula retraction and shoulder extention   Pain soc today 6/10 and end 3/10                OT Education - 08/07/21 0826     Education Details progress and HEP changes    Person(s) Educated Patient    Methods Explanation;Demonstration;Tactile cues;Verbal cues    Comprehension Returned demonstration;Verbalized understanding;Verbal cues required                 OT Long Term Goals - 07/17/21 1920       OT LONG TERM GOAL #1   Title Pt will report the pain in her left upper quadrant is reduced to 2/10 at the most    Baseline pain in L breast, axilla and into arm 6/10 - limiing her AROM and use of L arm    Time 4    Period Weeks    Status New    Target Date 08/14/21      OT LONG TERM GOAL #2   Title Pt will be independent in a home exericse program  to decrease scar tissue , pain and increase  left shoudler AROM and strength    Baseline have some knowledge using bands but still cradle when pain - do some scar massage - pain still 6/10 and tender breast at scar , upper L breast and axilla into arm    Time 6    Period Weeks    Status New    Target Date 08/28/21      OT LONG TERM GOAL #3   Title Pt will report  knowledgable about lymphedem risk reduction practices    Baseline review this date with pt - cont to need into    Time 4    Period Weeks    Status New    Target Date 08/14/21                   Plan - 08/07/21 0826     Clinical Impression Statement Pt had L lumpectomy in April  22- and radiation - was seen in [place] by PT for L UE pain, stiffness and cording. Pt transfered her care this week to OT at [facility]  that is closer to home. Pt with increase pain in in L breast , pect, axilla into upper arm and elbow to forearm  -Pt making progress since start in cording into arm - pain now mostly in L breast where fibrosis are  from 9 to 3 - with also scar tissue and fibrosis on the lateral breast  and then pull over pect  resulting with decrease L shoulder ABD and ext rotation more than Flexion.Pt was not seen for week - pt drove to [facility] and cradling arm when when hurting. Discuss with pt posture this date and sleeping positioning for scapula positioning. Add this date back her RTB for scapula retraction and shoulder ext pain free. Pt can wear her [employer] El[employer] soft sleeve and gauntlet as needed. But feels like she will benefit more from jovipak breast  pad to use for fibrosis in lateral and upper L breast. Await response back from [employer] - but today fitted with Komprex foam over L breast to posterior shoulder with chip bag to use in combination over fibrotic areas.  Pt responded great  with soft tissue with mini massager use on  L breast  - showed increase AROM in all planes this session - ABD 125 and flexion 35 -Cont to do  AAROM for L shoulder lfexion and ABD on wall - ext rotations stretch to sidelying on mat -  pt cont HEP.   Pt has limitations in Left shoulder AROM by increased pain from soft tissue fibrosis in L breast more than cording.  She continues to complain of  pect, breast , axillary soreness and soreness at left upper arm into forearm.   She will benefit from continued OT to address cording, ROM limtations strenght to increase use of L dominant hand in ADl's and IADl's    OT Occupational Profile and History Problem Focused Assessment - Including review of records relating to presenting problem    Occupational performance deficits (Please refer to evaluation for details): ADL's;IADL's;Rest and Sleep;Work;Play;Leisure;Social Participation    Body Structure / Function / Physical Skills ADL;Decreased knowledge of precautions;Skin integrity;Flexibility;Scar mobility;IADL;ROM;Edema;UE functional use;Pain    Rehab  Potential Good    Clinical Decision Making Limited treatment options, no task modification necessary    Comorbidities Affecting Occupational Performance: None    Modification or Assistance to Complete Evaluation  No modification of tasks or assist necessary to complete eval    OT Frequency --   1-2 x wk depending on progress   OT Duration 6 weeks    OT Treatment/Interventions Self-care/ADL training;Manual lymph drainage;Therapeutic activities;Contrast Bath;Therapeutic exercise;Scar mobilization;Passive range of motion;Manual Therapy;Patient/family education    Consulted and Agree with Plan of Care Patient             Patient will benefit from skilled therapeutic intervention in order to improve the following deficits and impairments:   Body Structure / Function / Physical Skills: ADL, Decreased knowledge of precautions, Skin integrity, Flexibility, Scar mobility, IADL, ROM, Edema, UE functional use, Pain       Visit Diagnosis: Disorder of the skin and subcutaneous tissue related to radiation, unspecified  Stiffness of left shoulder, not elsewhere classified  Muscle weakness (generalized)    Problem List Patient Active Problem List   Diagnosis Date Noted   Prediabetes 03/27/2021   Class 2 severe obesity due to  excess calories with serious comorbidity and body mass index (BMI) of 36.0 to 36.9 in adult ([place]) 03/27/2021   B12 deficiency anemia 01/19/2021   PVC (premature ventricular contraction) 01/17/2021   Iron deficiency anemia 01/17/2021   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 [place], [address] Phone: [phone]   Fax:  [phone]  Name: [name] MRN: [id number] Date of Birth: 1971-07-14

## 2021-08-08 MED ORDER — FLUOXETINE HCL 20 MG PO CAPS: 20.0000 mg | ORAL_CAPSULE | Freq: Every day | ORAL | 0 refills | Status: DC

## 2021-08-08 NOTE — Telephone Encounter (Signed)
Requested medication (s) are due for refill today:   Provider to review  Requested medication (s) are on the active medication list:   Yes  Future visit scheduled:   No   Last ordered: 08/07/2021 #20, 0 refills  Non delegated refill request.   Duplicate request    Requested Prescriptions  Pending Prescriptions Disp Refills   ALPRAZolam (XANAX) 0.25 MG tablet [Pharmacy Med Name: ALPRAZOLAM 0.25 MG TABLET] 20 tablet 0    Sig: TAKE 1 TABLET BY MOUTH 2 TIMES DAILY AS NEEDED.     Not Delegated - Psychiatry:  Anxiolytics/Hypnotics Failed - 08/06/2021  3:25 PM      Failed - This refill cannot be delegated      Failed - Urine Drug Screen completed in last 360 days      Passed - Valid encounter within last 6 months    Recent Outpatient Visits           3 weeks ago Other fatigue   [facility] [place], Coralie Keens, NP   1 month ago [facility] [place], [place] W, NP   4 months ago Acute non-recurrent frontal sinusitis   [facility] [place], Coralie Keens, NP   6 months ago Encounter for general adult medical examination with abnormal findings   [facility] [place], Coralie Keens, NP       Future Appointments             In 2 months Deboraha Sprang, MD [facility]

## 2021-08-08 NOTE — Telephone Encounter (Signed)
Refused yesterday because patient needs an appointment. Gardiner Rhyme, RN

## 2021-08-11 ENCOUNTER — Inpatient Hospital Stay: Payer: BC Managed Care – PPO | Attending: Hematology

## 2021-08-11 ENCOUNTER — Encounter: Payer: Self-pay | Admitting: Hematology

## 2021-08-11 ENCOUNTER — Ambulatory Visit: Payer: BC Managed Care – PPO | Admitting: Occupational Therapy

## 2021-08-11 ENCOUNTER — Inpatient Hospital Stay: Payer: BC Managed Care – PPO

## 2021-08-11 ENCOUNTER — Inpatient Hospital Stay (HOSPITAL_BASED_OUTPATIENT_CLINIC_OR_DEPARTMENT_OTHER): Payer: BC Managed Care – PPO | Admitting: Hematology

## 2021-08-11 ENCOUNTER — Other Ambulatory Visit: Payer: Self-pay

## 2021-08-11 VITALS — BP 139/94 | HR 97 | Temp 98.1°F | Resp 18 | Ht 66.0 in | Wt 210.9 lb

## 2021-08-11 DIAGNOSIS — L599 Disorder of the skin and subcutaneous tissue related to radiation, unspecified: Secondary | ICD-10-CM

## 2021-08-11 DIAGNOSIS — M6281 Muscle weakness (generalized): Secondary | ICD-10-CM

## 2021-08-11 DIAGNOSIS — Z170 Estrogen receptor positive status [ER+]: Secondary | ICD-10-CM | POA: Insufficient documentation

## 2021-08-11 DIAGNOSIS — C50412 Malignant neoplasm of upper-outer quadrant of left female breast: Secondary | ICD-10-CM

## 2021-08-11 DIAGNOSIS — D519 Vitamin B12 deficiency anemia, unspecified: Secondary | ICD-10-CM

## 2021-08-11 DIAGNOSIS — E538 Deficiency of other specified B group vitamins: Secondary | ICD-10-CM | POA: Insufficient documentation

## 2021-08-11 DIAGNOSIS — Z483 Aftercare following surgery for neoplasm: Secondary | ICD-10-CM

## 2021-08-11 DIAGNOSIS — M25612 Stiffness of left shoulder, not elsewhere classified: Secondary | ICD-10-CM

## 2021-08-11 DIAGNOSIS — D509 Iron deficiency anemia, unspecified: Secondary | ICD-10-CM

## 2021-08-11 LAB — CBC WITH DIFFERENTIAL (CANCER CENTER ONLY)
Abs Immature Granulocytes: 0.01 10*3/uL (ref 0.00–0.07)
Basophils Absolute: 0.1 10*3/uL (ref 0.0–0.1)
Basophils Relative: 2 %
Eosinophils Absolute: 0.1 10*3/uL (ref 0.0–0.5)
Eosinophils Relative: 2 %
HCT: 35.8 % — ABNORMAL LOW (ref 36.0–46.0)
Hemoglobin: 12.6 g/dL (ref 12.0–15.0)
Immature Granulocytes: 0 %
Lymphocytes Relative: 29 %
Lymphs Abs: 1.1 10*3/uL (ref 0.7–4.0)
MCH: 29.8 pg (ref 26.0–34.0)
MCHC: 35.2 g/dL (ref 30.0–36.0)
MCV: 84.6 fL (ref 80.0–100.0)
Monocytes Absolute: 0.3 10*3/uL (ref 0.1–1.0)
Monocytes Relative: 9 %
Neutro Abs: 2.1 10*3/uL (ref 1.7–7.7)
Neutrophils Relative %: 58 %
Platelet Count: 224 10*3/uL (ref 150–400)
RBC: 4.23 MIL/uL (ref 3.87–5.11)
RDW: 14.1 % (ref 11.5–15.5)
WBC Count: 3.7 10*3/uL — ABNORMAL LOW (ref 4.0–10.5)
nRBC: 0 % (ref 0.0–0.2)

## 2021-08-11 LAB — CMP (CANCER CENTER ONLY)
ALT: 15 U/L (ref 0–44)
AST: 19 U/L (ref 15–41)
Albumin: 4 g/dL (ref 3.5–5.0)
Alkaline Phosphatase: 138 U/L — ABNORMAL HIGH (ref 38–126)
Anion gap: 10 (ref 5–15)
BUN: 9 mg/dL (ref 6–20)
CO2: 25 mmol/L (ref 22–32)
Calcium: 9.5 mg/dL (ref 8.9–10.3)
Chloride: 103 mmol/L (ref 98–111)
Creatinine: 1.02 mg/dL — ABNORMAL HIGH (ref 0.44–1.00)
GFR, Estimated: >60 mL/min (ref >60)
Glucose, Bld: 107 mg/dL — ABNORMAL HIGH (ref 70–99)
Potassium: 3.8 mmol/L (ref 3.5–5.1)
Sodium: 138 mmol/L (ref 135–145)
Total Bilirubin: 0.5 mg/dL (ref 0.3–1.2)
Total Protein: 7.3 g/dL (ref 6.5–8.1)

## 2021-08-11 LAB — VITAMIN B12: Vitamin B-12: 358 pg/mL (ref 180–914)

## 2021-08-11 LAB — FERRITIN: Ferritin: 80 ng/mL (ref 11–307)

## 2021-08-11 MED ORDER — TRAMADOL HCL 50 MG PO TABS: 50.0000 mg | ORAL_TABLET | Freq: Two times a day (BID) | ORAL | 0 refills | Status: DC | PRN

## 2021-08-11 MED ORDER — GABAPENTIN 100 MG PO CAPS: 100.0000 mg | ORAL_CAPSULE | Freq: Three times a day (TID) | ORAL | 1 refills | Status: DC | PRN

## 2021-08-11 MED ORDER — VENLAFAXINE HCL ER 75 MG PO CP24: 75.0000 mg | ORAL_CAPSULE | Freq: Every day | ORAL | 1 refills | Status: DC

## 2021-08-11 MED ORDER — CYANOCOBALAMIN 1000 MCG/ML IJ SOLN
1000.0000 ug | Freq: Once | INTRAMUSCULAR | Status: AC
  Administered 2021-08-11: 1000 ug via INTRAMUSCULAR
  Filled 2021-08-11 (×2): qty 1

## 2021-08-11 MED ORDER — GABAPENTIN 300 MG PO CAPS: 300.0000 mg | ORAL_CAPSULE | Freq: Two times a day (BID) | ORAL | 1 refills | Status: DC

## 2021-08-11 NOTE — Patient Instructions (Signed)
Vitamin B12 Deficiency Vitamin B12 deficiency means that your body does not have enough vitamin B12. The body needs this important vitamin: To make red blood cells. To make genes (DNA). To help the nerves work. If you do not have enough vitamin B12 in your body, you can have health problems, such as not having enough red blood cells in the blood (anemia). What are the causes? Not eating enough foods that contain vitamin B12. Not being able to take in (absorb) vitamin B12 from the food that you eat. Certain diseases. A condition in which the body does not make enough of a certain protein. This results in your body not taking in enough vitamin B12. Having a surgery in which part of the stomach or small intestine is taken out. Taking medicines that make it hard for the body to take in vitamin B12. These include: Heartburn medicines. Some medicines that are used to treat diabetes. What increases the risk? Being an older adult. Eating a vegetarian or vegan diet that does not include any foods that come from animals. Not eating enough foods that contain vitamin B12 while you are pregnant. Taking certain medicines. Having alcoholism. What are the signs or symptoms? In some cases, there are no symptoms. If the condition leads to too few blood cells or nerve damage, symptoms can occur, such as: Feeling weak or tired. Not being hungry. Losing feeling (numbness) or tingling in your hands and feet. Redness and burning of the tongue. Feeling sad (depressed). Confusion or memory problems. Trouble walking. If anemia is very bad, symptoms can include: Being short of breath. Being dizzy. Having a very fast heartbeat. How is this treated? Changing the way you eat and drink, such as: Eating more foods that contain vitamin B12. Drinking little or no alcohol. Getting vitamin B12 shots. Taking vitamin B12 supplements by mouth (orally). Your doctor will tell you the dose that is best for you. Follow  these instructions at home: Eating and drinking  Eat foods that come from animals and have a lot of vitamin B12 in them. These include: Meats and poultry. This includes beef, pork, chicken, turkey, and organ meats, such as liver. Seafood, such as clams, rainbow trout, salmon, tuna, and haddock. Eggs. Dairy foods such as milk, yogurt, and cheese. Eat breakfast cereals that have vitamin B12 added to them (are fortified). Check the label. The items listed above may not be a complete list of foods and beverages you can eat and drink. Contact a dietitian for more information. Alcohol use Do not drink alcohol if: Your doctor tells you not to drink. You are pregnant, may be pregnant, or are planning to become pregnant. If you drink alcohol: Limit how much you have to: 0-1 drink a day for women. 0-2 drinks a day for men. Know how much alcohol is in your drink. In the [place] one drink equals one 12 oz bottle of beer (355 mL), one 5 oz glass of wine (148 mL), or one 1 oz glass of hard liquor (44 mL). General instructions Get any vitamin B12 shots if told by your doctor. Take supplements only as told by your doctor. Follow the directions. Keep all follow-up visits. Contact a doctor if: Your symptoms come back. Your symptoms get worse or do not get better with treatment. Get help right away if: You have trouble breathing. You have a very fast heartbeat. You have chest pain. You get dizzy. You faint. These symptoms may be an emergency. Get help right away. Call 911.   Do not wait to see if the symptoms will go away. Do not drive yourself to the hospital. Summary Vitamin B12 deficiency means that your body is not getting enough of the vitamin. In some cases, there are no symptoms of this condition. Treatment may include making a change in the way you eat and drink, getting shots, or taking supplements. Eat foods that have vitamin B12 in them. This information is not intended to replace advice  given to you by your health care provider. Make sure you discuss any questions you have with your health care provider. Document Revised: 04/18/2021 Document Reviewed: 04/18/2021 Elsevier Patient Education  2022 [employer]  

## 2021-08-11 NOTE — Progress Notes (Signed)
[place]   Telephone:([phone]) [phone] Fax:([phone]) [phone]   Clinic Follow up Note   Patient Care Team: Jearld Fenton, NP as PCP - General (Internal Medicine) Minna Merritts, MD as PCP - Cardiology (Cardiology) Erroll Luna, MD as Consulting Physician (General Surgery) Rockwell Germany, RN as Oncology Nurse Navigator Mauro Kaufmann, RN as Oncology Nurse Navigator Truitt Merle, MD as Consulting Physician (Hematology) Alla Feeling, NP as Nurse Practitioner (Nurse Practitioner) Kyung Rudd, MD as Consulting Physician (Radiation Oncology)  Date of Service:  08/11/2021  CHIEF COMPLAINT: f/u of left breast cancer  CURRENT THERAPY:  Tamoxifen, started 07/2021  ASSESSMENT & PLAN:  [name] is a 50 y.o. female with   1. 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 Diagnostic mammogram on 10/28/20 revealed indeterminate left breast mass at 2 o'clock. -Biopsy of the left breast mass on 10/31/20 was again benign but felt to be discordant. -She underwent left lumpectomy under Dr. Brantley Stage on 12/18/2020 . Pathology from surgery showed invasive lobular carcinoma, 1.1 cm, grade 2, negative resection margins. ER weakly positive, PR moderately positive, Her2 negative. Ki-67 of <5%. RS 16 with less than 1% benefit of chemotherapy.  -Her 01/07/21 Sentinel LN biopsy was negative.  -Her genetic testing from 01/16/21 was negative for pathogenetic mutations.  -She received adjuvant radiation with Dr. Lisbeth Renshaw on 6/1/220-03/19/21. She has recovered poorly with residual, painful lymphedema. -breast MRI 06/17/21 was benign.. -she finally began tamoxifen in 07/2021. However, there was a miscommunication about the interactions with antidepressants, so she switched her zoloft to prozac and discontinued the effexor. I advised her to switch back to  Effexor. She is aware and will wean off prozac with her PCP. -she is scheduled for annual mammogram on 12/19/21. -I will see her back in 4 months  2. Left Breast/Axilla Lymphedema and pain  -followed by PT since surgery -she is now going to PT twice weekly due to increasing cording and scar related pain. She gets the areas massaged, but the pain is relieved for only 24-36 hours. -I advised her to see Dr. Brantley Stage for management. She is agreeable. I will reach out to him to get her worked in [place] -I discussed her that narcotics is along her solution for pain.  I encouraged her to increase gabapentin, and reviewed dose today.  I will give her a small prescription of tramadol while she is awaiting to see Dr. Brantley Stage.    3. Depression, Anxiety, Social support -she was previously on Zoloft with Xanax. I prescribed Effexor for her to switch to when she began tamoxifen, but there was a misunderstanding and she switched to Prozac. I have asked her to wean off the Prozac and begin Effexor. -she notes recent stress due to her mother's breast cancer recurring and metastasizing. Her mother is currently hospitalized for weakness secondary to spine metastases.   4. Mild anemia, iron deficiency and B12 deficiency  -She has h/o Lap band surgery. She also has very irregular periods since she was a teenager. Her last period was around age 44, she thinks. She is still premenopausal on 01/16/21 labs. She has been anemic since she was a teen (mild and stable).  -I discussed her prior lap band surgery is the likely cause of her B12 deficiency. I started her on B12 injections weekly from 01/29/21 and then monthly from 04/18/21.  -  she only required 3 doses of IV Venofer in 02/2021. Hgb has been WNL since 05/2021.     PLAN:  -proceed to B12 injection today and continue monthly  -continue tamoxifen -wean off Prozac in 2-3 weeks, then switch to Effexor, I called in today  -labs and f/u in 4 months -will message Dr. Brantley Stage  for her f/u and pain management  -will increase gabapentin to 600 mg twice daily, and use 100 mg as additional dose as needed -I refilled her tramadol for 20 tablets today while she is waiting to see Dr. Brantley Stage.  I do not plan to refill tramadol or other narcotics for her.   No problem-specific Assessment & Plan notes found for this encounter.   SUMMARY OF ONCOLOGIC HISTORY: Oncology History Overview Note  Cancer Staging Carcinoma of upper-outer quadrant of left breast, estrogen receptor positive ([place]) Staging form: Breast, AJCC 8th Edition - Pathologic stage from 01/02/2021: No Stage Recommended (pT1c, cN0, cM0, G2, ER+, PR+, HER2-) - Unsigned    Carcinoma of upper-outer quadrant of left breast, estrogen receptor positive ([place])  10/28/2020 Imaging   DIGITAL DIAGNOSTIC UNILATERAL LEFT MAMMOGRAM WITH TOMOSYNTHESIS AND CAD; ULTRASOUND LEFT BREAST LIMITED  CLINICAL DATA: 50 year old female presenting for short-term follow-up of a left breast biopsy demonstrating fibrocystic changes.  IMPRESSION: Indeterminate left breast mass at 2 o'clock 7 cm from the nipple. Targeted ultrasound of the left axilla demonstrates normal lymph nodes.   10/31/2020 Pathology Results   Breast, left, needle core biopsy, 2 o'clock - FIBROADIPOSE TISSUE WITH MINIMAL BREAST PARENCHYMA DEMONSTRATING MILD FIBROCYSTIC CHANGE TO INCLUDE APOCRINE METAPLASIA.  This was found to be discordant.   12/18/2020 Surgery   A. BREAST, LEFT, LUMPECTOMY:  - Invasive lobular carcinoma, 1.1 cm, grade 2.  See comment  - Resection margins are negative for carcinoma; inferior margin is  focally less than 1 mm from carcinoma and medial margin is approximately  1 mm from carcinoma  - Biopsy related changes  - Background fibrocystic change   PROGNOSTIC INDICATOR RESULTS:  - The tumor cells are NEGATIVE for Her2 (0).  - Estrogen Receptor:       POSITIVE, 30%, WEAK STAINING  - Progesterone Receptor:   POSITIVE, 80%, MODERATE  STAINING  - Proliferation Marker Ki-67:   <5%    12/18/2020 Oncotype testing   Oncotype  Recurrence Score 16  Distant recurrence risk at 9 years of 4% with AI or Tamoxifen alone.  Less than 1% benefit of chemotherapy.    01/02/2021 Initial Diagnosis   Carcinoma of upper-outer quadrant of left breast, estrogen receptor positive ([place])   01/02/2021 Cancer Staging   Staging form: Breast, AJCC 8th Edition - Pathologic stage from 01/02/2021: Stage IA (pT1c, pN0, cM0, G2, ER+, PR+, HER2-) - Signed by Alla Feeling, NP on 05/12/2021 Method of lymph node assessment: Clinical Multigene prognostic tests performed: None Histologic grading system: 3 grade system Residual tumor (R): R0 - None    01/07/2021 Surgery   LEFT SENTINEL NODE BIOPSY by Dr Brantley Stage  FINAL MICROSCOPIC DIAGNOSIS:   A. LYMPH NODE, LEFT, SENTINEL, EXCISION:  - One lymph node negative for metastatic carcinoma (0/1).   B. LYMPH NODE, LEFT, SENTINEL, EXCISION:  - One lymph node negative for metastatic carcinoma (0/1).   COMMENT:  Immunohistochemistry for cytokeratin AE1/AE3 is performed on parts A and  B and no metastatic carcinoma is identified   02/05/2021 - 03/19/2021 Radiation Therapy   Adjuvant Radiation with Dr Lisbeth Renshaw    05/13/2021 -  Anti-estrogen oral therapy  Tamoxifen 45m once daily starting in September 2022   05/13/2021 Survivorship   SCP delivered by LCira Rue NP      INTERVAL HISTORY:  REriyanna Parsons here for a follow up of breast cancer. She was last seen by me on 02/21/21 and by survivorship in the interim. She presents to the clinic alone. She reports she has been having very bad cording recently that is very painful. She notes she has been followed by physical therapy and now is going twice a week. 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 She reports she didn't begin tamoxifen until 3 weeks ago. She adds that her mother is going through recurrent/metastatic  breast cancer. She reports her mom has brain and spine metastases and adds her mom is currently in the hospital for weakness.   All other systems were reviewed with the patient and are negative.  MEDICAL HISTORY:  Past Medical History:  Diagnosis Date   Allergy    Anemia    present for many years, mild, stable   Anxiety    [place]    [place] [place]   Dysrhythmia    PAC's on flecanide (did not tolerate beta blockers)   Family history of breast cancer 01/20/2021   Family history of prostate cancer 01/20/2021   Frequent headaches    HSV infection    fever blisters   Hypertension    Hypothyroidism     SURGICAL HISTORY: Past Surgical History:  Procedure Laterality Date   APPENDECTOMY  2013   BREAST LUMPECTOMY WITH RADIOACTIVE SEED LOCALIZATION Left 12/18/2020   Procedure: LEFT BREAST LUMPECTOMY WITH RADIOACTIVE SEED LOCALIZATION;  Surgeon: CErroll Luna MD;  [facility]  Service: General;  Laterality: Left;   KNEE ARTHROSCOPY Right    lapband  2013   SENTINEL NODE BIOPSY Left 01/07/2021   Procedure: LEFT SENTINEL NODE BIOPSY;  Surgeon: CErroll Luna MD;  [facility]  Service: General;  Laterality: Left;   TONSILLECTOMY  2012    I have reviewed the social history and family history with the patient and they are unchanged from previous note.  ALLERGIES:  is allergic to ciprofloxacin, keflex [cephalexin], and trazodone and nefazodone.  MEDICATIONS:  Current Outpatient Medications  Medication Sig Dispense Refill   gabapentin (NEURONTIN) 300 MG capsule Take 1 capsule (300 mg total) by mouth 2 (two) times daily. 60 capsule 1   venlafaxine XR (EFFEXOR-XR) 75 MG 24 hr capsule Take 1 capsule (75 mg total) by mouth daily with breakfast. 30 capsule 1   ALPRAZolam (XANAX) 0.25 MG tablet Take 1 tablet (0.25 mg total) by mouth 2 (two) times daily as needed. 20 tablet 0   atorvastatin (LIPITOR) 10 MG tablet Take 1 tablet (10 mg  total) by mouth daily. 90 tablet 1   butalbital-acetaminophen-caffeine (FIORICET) 50-325-40 MG tablet TAKE 1 TABLET BY MOUTH EVERY 6 (SIX) HOURS AS NEEDED FOR HEADACHE. 20 tablet 2   cyclobenzaprine (FLEXERIL) 10 MG tablet Take 1 tablet (10 mg total) by mouth daily as needed (migraines). 15 tablet 0   flecainide (TAMBOCOR) 50 MG tablet TAKE 1 TABLET BY MOUTH TWICE A DAY (Patient taking differently: Take 50 mg by mouth 2 (two) times daily.) 180 tablet 1   fluticasone (FLONASE) 50 MCG/ACT nasal spray SPRAY 2 SPRAYS INTO EACH NOSTRIL EVERY DAY (Patient taking differently: Place 2 sprays into both nostrils daily as needed for allergies.) 48 mL 0   gabapentin (NEURONTIN)  100 MG capsule Take 1 capsule (100 mg total) by mouth 3 (three) times daily as needed. Use in addition to 380m dose 90 capsule 1   ibuprofen (ADVIL) 800 MG tablet Take 1 tablet (800 mg total) by mouth every 8 (eight) hours as needed. 30 tablet 0   levocetirizine (XYZAL) 5 MG tablet TAKE 1 TABLET BY MOUTH EVERY DAY IN THE EVENING 90 tablet 3   levothyroxine (SYNTHROID) 112 MCG tablet Take 1 tablet (112 mcg total) by mouth daily before breakfast. 90 tablet 2   losartan (COZAAR) 25 MG tablet TAKE 1 TABLET BY MOUTH EVERY DAY (Patient taking differently: Take 25 mg by mouth at bedtime.) 90 tablet 3   oxyCODONE (OXY IR/ROXICODONE) 5 MG immediate release tablet Take 1 tablet (5 mg total) by mouth every 6 (six) hours as needed for severe pain. 30 tablet 0   predniSONE (DELTASONE) 10 MG tablet Take 6 tabs on day 1, 5 tabs on day 2, 4 tabs on day 3, 3 tabs on day 4, 2 tabs on day 5, 1 tab on day 6 21 tablet 0   rizatriptan (MAXALT-MLT) 10 MG disintegrating tablet Take 1 tablet (10 mg total) by mouth as needed for migraine. May repeat in 2 hours if needed 10 tablet 1   SUMAtriptan (IMITREX) 25 MG tablet TAKE 1 TABLET (25 MG TOTAL) BY MOUTH EVERY 2 (TWO) HOURS AS NEEDED FOR MIGRAINE. MAY REPEAT IN 2 HOURS IF HEADACHE PERSISTS OR RECURS. 10 tablet 0    tamoxifen (NOLVADEX) 20 MG tablet TAKE 1 TABLET BY MOUTH EVERY DAY 90 tablet 1   traMADol (ULTRAM) 50 MG tablet Take 1 tablet (50 mg total) by mouth every 12 (twelve) hours as needed. 20 tablet 0   valACYclovir (VALTREX) 500 MG tablet TAKE 1 TABLET (500 MG TOTAL) BY MOUTH DAILY AS NEEDED (HERPES SIMPLEX). 90 tablet 0   No current facility-administered medications for this visit.    PHYSICAL EXAMINATION: ECOG PERFORMANCE STATUS: 1 - Symptomatic but completely ambulatory  Vitals:   08/11/21 1402  BP: (!) 139/94  Pulse: 97  Resp: 18  Temp: 98.1 F (36.7 C)  SpO2: 97%   Wt Readings from Last 3 Encounters:  08/11/21 210 lb 14.4 oz (95.7 kg)  07/18/21 210 lb 6.4 oz (95.4 kg)  05/13/21 217 lb (98.4 kg)     GENERAL:alert, no distress and comfortable SKIN: skin color, texture, turgor are normal, no rashes or significant lesions EYES: normal, Conjunctiva are pink and non-injected, sclera clear  NECK: supple, thyroid normal size, non-tender, without nodularity LYMPH:  no palpable lymphadenopathy in the cervical, axillary  LUNGS: clear to auscultation and percussion with normal breathing effort HEART: regular rate & rhythm and no murmurs and no lower extremity edema ABDOMEN:abdomen soft, non-tender and normal bowel sounds Musculoskeletal:no cyanosis of digits and no clubbing  NEURO: alert & oriented x 3 with fluent speech, no focal motor/sensory deficits BREAST: left breast lymphedema, tender to touch. No palpable mass, nodules or adenopathy bilaterally.   LABORATORY DATA:  I have reviewed the data as listed CBC Latest Ref Rng & Units 08/11/2021 07/18/2021 06/20/2021  WBC 4.0 - 10.5 K/uL 3.7(L) 3.9 8.1  Hemoglobin 12.0 - 15.0 g/dL 12.6 12.2 12.0  Hematocrit 36.0 - 46.0 % 35.8(L) 35.4 34.2(L)  Platelets 150 - 400 K/uL 224 204 303     CMP Latest Ref Rng & Units 08/11/2021 07/18/2021 06/23/2021  Glucose 70 - 99 mg/dL 107(H) 98 -  BUN 6 - 20 mg/dL 9 14 -  Creatinine 0.44 - 1.00 mg/dL  1.02(H) 1.46(H) -  Sodium 135 - 145 mmol/L 138 139 -  Potassium 3.5 - 5.1 mmol/L 3.8 3.6 3.7  Chloride 98 - 111 mmol/L 103 104 -  CO2 22 - 32 mmol/L 25 24 -  Calcium 8.9 - 10.3 mg/dL 9.5 9.1 -  Total Protein 6.5 - 8.1 g/dL 7.3 6.6 -  Total Bilirubin 0.3 - 1.2 mg/dL 0.5 0.2 -  Alkaline Phos 38 - 126 U/L 138(H) - -  AST 15 - 41 U/L 19 28 -  ALT 0 - 44 U/L 15 23 -      RADIOGRAPHIC STUDIES: I have personally reviewed the radiological images as listed and agreed with the findings in the report. No results found.    No orders of the defined types were placed in this encounter.  All questions were answered. The patient knows to call the clinic with any problems, questions or concerns. No barriers to learning was detected. The total time spent in the appointment was 30 minutes.     Truitt Merle, MD 08/11/2021   I, Wilburn Mylar, am acting as scribe for Truitt Merle, MD.   I have reviewed the above documentation for accuracy and completeness, and I agree with the above.

## 2021-08-11 NOTE — Progress Notes (Signed)
Patient left the building without receiving injection.

## 2021-08-11 NOTE — Therapy (Signed)
[facility] [address] [place], [address] Phone: [phone]   Fax:  [phone]  Occupational Therapy Treatment  Patient Details  Name: [name] MRN: [id number] Date of Birth: Sep 14, 1970 Referring Provider (OT): Dara Lords   Encounter Date: 08/11/2021   OT End of Session - 08/11/21 1331     Visit Number 5    Number of Visits 12    Date for OT Re-Evaluation 09/11/21    OT Start Time 1130    OT Stop Time 1219    OT Time Calculation (min) 49 min    Activity Tolerance Patient tolerated treatment well    Behavior During Therapy [facility] for tasks assessed/performed             Past Medical History:  Diagnosis Date   Allergy    Anemia    present for many years, mild, stable   Anxiety    Breast cancer ([place])    Breast cancer ([place]) 12/18/2020   Dysrhythmia    PAC's on flecanide (did not tolerate beta blockers)   Family history of breast cancer 01/20/2021   Family history of prostate cancer 01/20/2021   Frequent headaches    HSV infection    fever blisters   Hypertension    Hypothyroidism     Past Surgical History:  Procedure Laterality Date   APPENDECTOMY  2013   BREAST LUMPECTOMY WITH RADIOACTIVE SEED LOCALIZATION Left 12/18/2020   Procedure: LEFT BREAST LUMPECTOMY WITH RADIOACTIVE SEED LOCALIZATION;  Surgeon: Erroll Luna, MD;  Location: [place];  Service: General;  Laterality: Left;   KNEE ARTHROSCOPY Right    lapband  2013   SENTINEL NODE BIOPSY Left 01/07/2021   Procedure: LEFT SENTINEL NODE BIOPSY;  Surgeon: Erroll Luna, MD;  Location: [place];  Service: General;  Laterality: Left;   TONSILLECTOMY  2012    There were no vitals filed for this visit.   Subjective Assessment - 08/11/21 1328     Subjective  It feels like my pain just do not get better. It has been 4 months now. Some times into my breast , axilla and into my arm today again    Pertinent History An  MRI in September showed a seroma that they are just watching, and don't think it needs to be drained.       Pertinent History left beast cancer with lumpectomy and a second surgery with 2 nodes removed, Pt had 28 treatments of radiation, Pt said the she had some type of reaction after surgery and her left breast swelled severly.  The breast and L arm pai. It occurs when I use my arm more.    Patient Stated Goals I want the pain better so I can use my arm like [place]    Currently in Pain? Yes    Pain Score 7     Pain Location Breast   axilla into volar L elbow   Pain Orientation Left    Pain Descriptors / Indicators Aching;Tender;Tightness    Pain Type Surgical pain    Pain Onset More than a month ago    Pain Frequency Intermittent                 LYMPHEDEMA/ONCOLOGY QUESTIONNAIRE - 08/11/21 0001       Left Upper Extremity Lymphedema   15 cm Proximal to Olecranon Process 38 cm    10 cm Proximal to Olecranon Process 37 cm    Olecranon Process 27  cm    15 cm Proximal to Ulnar Styloid Process 24.3 cm    Just Proximal to Ulnar Styloid Process 17.4 cm              PT report her pain since Friday 7/10  on the bottom and Lateral L breast into axilla and anterior upper arm to elbow - compare to last week pain was more L superior breast and pect area -  Did drove over Thanksgiving to [place] last week  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 , 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 , sitting and sleeping    Mini massager use this date on L lateral breast and at 3-6 o'clock  on  scar and fibrosis  - great response   and focus this date also on mini massager and fibrosisf- great progress with increase  AAROM and AROM external rotation , ABD and flexion Pt had less cording in volar  upper arm  and  volar forearm  compare to [facility] with me      Recommend pt to get mini massager last wek to use at home and jovipak breast pad - unilateral breast pad under bra or camisole - need to look for funding  assistance - pt insurance not covering Did fit pt  last week  in the meantime with Komprex foam on L breast to posterior shoulder and chip bag made to use in combination over fibrosis areas   Pain decrease from 7/10 to 2/10 and increase  AROM  Pt HEP review and can cont to wear compression sleeve if needed -but her measurement in L arm compare to R - WNL Pt cont with AAROM flexion , ABD and RTB for scapula retraction and shoulder extention                    OT Education - 08/11/21 1331     Education Details progress and HEP changes    Person(s) Educated Patient    Methods Explanation;Demonstration;Tactile cues;Verbal cues    Comprehension Returned demonstration;Verbalized understanding;Verbal cues required                 OT Long Term Goals - 07/17/21 1920       OT LONG TERM GOAL #1   Title Pt will report the pain in her left upper quadrant is reduced to 2/10 at the most    Baseline pain in L breast, axilla and into arm 6/10 - limiing her AROM and use of L arm    Time 4    Period Weeks    Status New    Target Date 08/14/21      OT LONG TERM GOAL #2   Title Pt will be independent in a home exericse program  to decrease scar tissue , pain and increase  left shoudler AROM and strength    Baseline have some knowledge using bands but still cradle when pain - do some scar massage - pain still 6/10 and tender breast at scar , upper L breast and axilla into arm    Time 6    Period Weeks    Status New    Target Date 08/28/21      OT LONG TERM GOAL #3   Title Pt will report  knowledgable about lymphedem risk reduction practices    Baseline review this date with pt - cont to need into    Time 4    Period Weeks    Status New  Target Date 08/14/21                   Plan - 08/11/21 1331     Clinical Impression Statement Pt had L lumpectomy in April 22- and radiation - was seen in [place] by PT for L UE pain, stiffness and cording. Pt transfered her  care 2 -3 wks ago  to OT at [facility]  that is closer to home. Pt  pain at [facility] with me was in L breast, axilla, pect and arm to  volar elbow. Pain relieve after session for about 24 to  36 hrs but then increaset again. The last 2 session pain in arm was better and could focus more on breast scar tissue, fibrosis , pain with improvement in this date with AROM of shoulder in superior breast and pect. This date pain coming in at Lat breast, axilla to volar upperarm into elbow.   Discuss with pt posture last date and sleeping positioning for scapula positioning. Pt to cont since last week   RTB for scapula retraction and shoulder ext pain free. Pt can wear her [employer] Elvarex soft sleeve and gauntlet as needed but her lymphedema measurements  looking WNL compare to the R. I do feel  she will benefit more from jovipak breast  pad to use for fibrosis in  L breast. Await response back from [employer] - but last week fitted with Komprex foam over L breast to posterior shoulder with chip bag to use in combination over fibrotic areas- reinforce again use of it.  Pt responded great  with soft tissue with mini massager on superior breast last time -and this date lateral breast and axilla - with inrease AROM in all planes with pain decrease from 7 to 2/10  AAROM for L shoulder lfexion and ABD on wall -   change ext rotations stretch to supine  Reinforce for pt to get mini massager to use at home on breast fibrosis and chip bag at home to get carry over from session. .   Pt has limitations in Left shoulder AROM by increased pain from soft tissue fibrosis in L breast, pect and axilla more than cording.  She continues to complain of  pect, breast , axillary  tendernss and pain wiht pull into left upper arm and forearm.   She will benefit from continued OT to address cording, ROM limtations strenght to increase use of L dominant hand in ADl's and IADl's    OT Occupational Profile and History Problem Focused Assessment -  Including review of records relating to presenting problem    Occupational performance deficits (Please refer to evaluation for details): ADL's;IADL's;Rest and Sleep;Work;Play;Leisure;Social Participation    Body Structure / Function / Physical Skills ADL;Decreased knowledge of precautions;Skin integrity;Flexibility;Scar mobility;IADL;ROM;Edema;UE functional use;Pain    Rehab Potential Good    Clinical Decision Making Limited treatment options, no task modification necessary    Comorbidities Affecting Occupational Performance: None    Modification or Assistance to Complete Evaluation  No modification of tasks or assist necessary to complete eval    OT Frequency --   1-2 x wk depending on progress   OT Duration 4 weeks    OT Treatment/Interventions Self-care/ADL training;Manual lymph drainage;Therapeutic activities;Contrast Bath;Therapeutic exercise;Scar mobilization;Passive range of motion;Manual Therapy;Patient/family education    Consulted and Agree with Plan of Care Patient             Patient will benefit from skilled therapeutic intervention in order to improve the  following deficits and impairments:   Body Structure / Function / Physical Skills: ADL, Decreased knowledge of precautions, Skin integrity, Flexibility, Scar mobility, IADL, ROM, Edema, UE functional use, Pain       Visit Diagnosis: Stiffness of left shoulder, not elsewhere classified  Muscle weakness (generalized)  Aftercare following surgery for neoplasm  Disorder of the skin and subcutaneous tissue related to radiation, unspecified    Problem List Patient Active Problem List   Diagnosis Date Noted   Prediabetes 03/27/2021   Class 2 severe obesity due to excess calories with serious comorbidity and body mass index (BMI) of 36.0 to 36.9 in adult ([place]) 03/27/2021   B12 deficiency anemia 01/19/2021   PVC (premature ventricular contraction) 01/17/2021   Iron deficiency anemia 01/17/2021   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, [place], [address] Phone: [phone]   Fax:  [phone]  Name: [name] MRN: [id number] Date of Birth: [birth year]

## 2021-08-14 ENCOUNTER — Ambulatory Visit: Payer: BC Managed Care – PPO | Admitting: Occupational Therapy

## 2021-08-18 ENCOUNTER — Ambulatory Visit: Payer: BC Managed Care – PPO | Admitting: Occupational Therapy

## 2021-08-18 DIAGNOSIS — Z483 Aftercare following surgery for neoplasm: Secondary | ICD-10-CM

## 2021-08-18 DIAGNOSIS — L599 Disorder of the skin and subcutaneous tissue related to radiation, unspecified: Secondary | ICD-10-CM

## 2021-08-18 DIAGNOSIS — M25612 Stiffness of left shoulder, not elsewhere classified: Secondary | ICD-10-CM

## 2021-08-18 NOTE — Therapy (Signed)
[facility] [address] [place], [address] Phone: [phone]   Fax:  [phone]  Occupational Therapy Treatment  Patient Details  Name: [name] MRN: [id number] Date of Birth: August 31, 1971 Referring Provider (OT): Dara Lords   Encounter Date: 08/18/2021   OT End of Session - 08/18/21 1426     Visit Number 6    Number of Visits 12    Date for OT Re-Evaluation 09/11/21    OT Start Time 0830    OT Stop Time 0900    OT Time Calculation (min) 30 min    Activity Tolerance Patient tolerated treatment well    Behavior During Therapy [facility] for tasks assessed/performed             Past Medical History:  Diagnosis Date   Allergy    Anemia    present for many years, mild, stable   Anxiety    Breast cancer ([place])    Breast cancer ([place]) 12/18/2020   Dysrhythmia    PAC's on flecanide (did not tolerate beta blockers)   Family history of breast cancer 01/20/2021   Family history of prostate cancer 01/20/2021   Frequent headaches    HSV infection    fever blisters   Hypertension    Hypothyroidism     Past Surgical History:  Procedure Laterality Date   APPENDECTOMY  2013   BREAST LUMPECTOMY WITH RADIOACTIVE SEED LOCALIZATION Left 12/18/2020   Procedure: LEFT BREAST LUMPECTOMY WITH RADIOACTIVE SEED LOCALIZATION;  Surgeon: Erroll Luna, MD;  Location: [place];  Service: General;  Laterality: Left;   KNEE ARTHROSCOPY Right    lapband  2013   SENTINEL NODE BIOPSY Left 01/07/2021   Procedure: LEFT SENTINEL NODE BIOPSY;  Surgeon: Erroll Luna, MD;  Location: [place];  Service: General;  Laterality: Left;   TONSILLECTOMY  2012    There were no vitals filed for this visit.   Subjective Assessment - 08/18/21 1424     Subjective  DId see my oncologist - referrel to my surgeon for next week- did take some Gabapentin and pain medication - do help -but drove again to [facility] for my Mom that has  cancer now every  where in her body - she is in nursing home now -and  all ths stress probably not helping my pain    Pertinent History An MRI in September showed a seroma that they are just watching, and don't think it needs to be drained.       Pertinent History left beast cancer with lumpectomy and a second surgery with 2 nodes removed, Pt had 28 treatments of radiation, Pt said the she had some type of reaction after surgery and her left breast swelled severly.  The breast and L arm pai. It occurs when I use my arm more.    Patient Stated Goals I want the pain better so I can use my arm like [place]    Currently in Pain? 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 , sitting and sleeping    Mini massager use this date on L lateral breast and at 3-6 o'clock  on  scar and fibrosis  -and superior 1/2 breast - great response   and focus this date also on mini massager and fibrosisf- great progress with increase  AAROM and AROM external rotation , ABD and flexion Pt had less cording in volar  upper arm  and  volar forearm  compare to [facility] with me      Recommend pt to get mini massager last wek to use at home and jovipak breast pad - unilateral breast pad under bra or camisole - need to look for funding assistance - pt insurance not covering Did fit pt in past with Komprex foam on L breast to posterior shoulder and chip bag made to use in combination over fibrosis areas   Pain decrease from 4/10 to 2/10 and increase  AROM  Pt HEP review and can cont to wear compression sleeve if needed -but her  measurement in L arm compare to R - WNL Pt cont with AAROM flexion , ABD and RTB for scapula retraction and shoulder extention    PROM for shoulder flexion 150  And AROM abd of shoulder end of session 145 , flexion AROM 130             OT Education - 08/18/21 1425     Education Details progress and HEP changes    Person(s) Educated Patient    Methods Explanation;Demonstration;Tactile cues;Verbal cues    Comprehension Returned demonstration;Verbalized understanding;Verbal cues required                 OT Long Term Goals - 07/17/21 1920       OT LONG TERM GOAL #1   Title Pt will report the pain in her left upper quadrant is reduced to 2/10 at the most    Baseline pain in L breast, axilla and into arm 6/10 - limiing her AROM and use of L arm    Time 4    Period Weeks    Status New    Target Date 08/14/21      OT LONG TERM GOAL #2   Title Pt will be independent in a home exericse program  to decrease scar tissue , pain and increase  left shoudler AROM and strength    Baseline have some knowledge using bands but still cradle when pain - do some scar massage - pain still 6/10 and tender breast at scar , upper L breast and axilla into arm    Time 6    Period Weeks    Status New    Target Date 08/28/21      OT LONG TERM GOAL #3   Title Pt will report  knowledgable about lymphedem risk reduction practices    Baseline review this date with pt - cont to need into    Time 4    Period Weeks    Status New    Target Date 08/14/21                   Plan - 08/18/21 1426     Clinical Impression Statement Pt had L lumpectomy in April 22- and radiation - was seen in [place] by PT for L UE pain, stiffness and cording. Pt transfered her care 2 -3 wks ago  to OT at [facility]  that is closer to home. Pt  pain at [facility] with me was in L breast, axilla, pect and arm to  volar elbow. Pain relieve after session for about 24 to  36 hrs but then increaset again. The  pain better in arm - mostly this date  on pect and upper and lateral breast scar tissue, fibrosis. L shoulder PROM and AROM improve after soft tissue  Discuss with pt posture and sleeping positioning for scapula positioning again .Cont RTB for scapula retraction and shoulder ext pain free. Pt can wear her Job[employer] Elvarex soft sleeve and gauntlet as needed but her lymphedema measurements  looking WNL compare to the R. I do feel  she will benefit more from jovipak breast  pad to use for fibrosis in  L breast. Await response back from [employer] - but last week fitted with Komprex foam over L breast to posterior shoulder with chip bag to use in combination over fibrotic areas- reinforce again use of it.  Pt responded great  with soft tissue with mini massager on superiorand lateral  breast again with increase ROM and less pain.  Reinforce for pt to get mini massager to use at home on breast fibrosis and chip bag at home to get carry over from session. She report she did get one this past weekend but did not use it yet.  Pt has limitations in Left shoulder AROM by increased pain from soft tissue fibrosis in L breast, pect and axilla more than cording.  She continues to complain of  pect, breast , axillary  tendernss and pain wiht pull into left upper arm and forearm.   She will benefit from continued OT to address cording, ROM limtations strenght to increase use of L dominant hand in ADl's and IADl's    OT Occupational Profile and History Problem Focused Assessment - Including review of records relating to presenting problem    Occupational performance deficits (Please refer to evaluation for details): ADL's;IADL's;Rest and Sleep;Work;Play;Leisure;Social Participation    Body Structure / Function / Physical Skills ADL;Decreased knowledge of precautions;Skin integrity;Flexibility;Scar mobility;IADL;ROM;Edema;UE functional use;Pain    Rehab Potential Fair    Clinical Decision Making Limited treatment options, no  task modification necessary    Comorbidities Affecting Occupational Performance: None    OT Frequency --   1-2  x wk depending on symptoms   OT Duration 4 weeks    OT Treatment/Interventions Self-care/ADL training;Manual lymph drainage;Therapeutic activities;Contrast Bath;Therapeutic exercise;Scar mobilization;Passive range of motion;Manual Therapy;Patient/family education    Consulted and Agree with Plan of Care Patient             Patient will benefit from skilled therapeutic intervention in order to improve the following deficits and impairments:   Body Structure / Function / Physical Skills: ADL, Decreased knowledge of precautions, Skin integrity, Flexibility, Scar mobility, IADL, ROM, Edema, UE functional use, Pain       Visit Diagnosis: Stiffness of left shoulder, not elsewhere classified  Aftercare following surgery for neoplasm  Disorder of the skin and subcutaneous tissue related to radiation, unspecified    Problem List Patient Active Problem List   Diagnosis Date Noted   Prediabetes 03/27/2021   Class 2 severe obesity due to excess calories with serious comorbidity and body mass index (BMI) of 36.0 to 36.9 in adult ([place]) 03/27/2021   B12 deficiency anemia 01/19/2021   PVC (premature ventricular contraction) 01/17/2021   Iron deficiency anemia 01/17/2021   Carcinoma of upper-outer  quadrant of left breast, estrogen receptor positive ([place]) 01/02/2021   HLD (hyperlipidemia) 08/19/2019   HSV-2 (herpes simplex virus 2) infection 08/19/2019   GAD (generalized anxiety disorder) 06/03/2018   Chronic headaches 09/21/2013   Hypothyroidism 09/21/2013    Rosalyn Gess, OTR/L,CLT 08/18/2021, 2:31 PM  [facility] [address] [place], [address] Phone: [phone]   Fax:  [phone]  Name: [name] MRN: [id number] Date of Birth: 1970-12-02

## 2021-08-21 ENCOUNTER — Ambulatory Visit: Payer: BC Managed Care – PPO | Admitting: Occupational Therapy

## 2021-08-21 DIAGNOSIS — M25612 Stiffness of left shoulder, not elsewhere classified: Secondary | ICD-10-CM

## 2021-08-21 DIAGNOSIS — M6281 Muscle weakness (generalized): Secondary | ICD-10-CM

## 2021-08-21 DIAGNOSIS — L599 Disorder of the skin and subcutaneous tissue related to radiation, unspecified: Secondary | ICD-10-CM | POA: Diagnosis not present

## 2021-08-21 DIAGNOSIS — Z483 Aftercare following surgery for neoplasm: Secondary | ICD-10-CM

## 2021-08-21 NOTE — Therapy (Signed)
[place] [facility] [address] [place], [address] Phone: [phone]   Fax:  [phone]  Occupational Therapy Treatment  Patient Details  Name: [name] MRN: [id number] Date of Birth: 1971/07/24 Referring Provider (OT): Dara Lords   Encounter Date: 08/21/2021   OT End of Session - 08/21/21 0912     Visit Number 7    Number of Visits 12    Date for OT Re-Evaluation 09/11/21    OT Start Time 0825    OT Stop Time 0900    OT Time Calculation (min) 35 min    Activity Tolerance Patient tolerated treatment well    Behavior During Therapy [facility] for tasks assessed/performed             Past Medical History:  Diagnosis Date   Allergy    Anemia    present for many years, mild, stable   Anxiety    Breast cancer ([place])    Breast cancer ([place]) 12/18/2020   Dysrhythmia    PAC's on flecanide (did not tolerate beta blockers)   Family history of breast cancer 01/20/2021   Family history of prostate cancer 01/20/2021   Frequent headaches    HSV infection    fever blisters   Hypertension    Hypothyroidism     Past Surgical History:  Procedure Laterality Date   APPENDECTOMY  2013   BREAST LUMPECTOMY WITH RADIOACTIVE SEED LOCALIZATION Left 12/18/2020   Procedure: LEFT BREAST LUMPECTOMY WITH RADIOACTIVE SEED LOCALIZATION;  Surgeon: Erroll Luna, MD;  Location: [place];  Service: General;  Laterality: Left;   KNEE ARTHROSCOPY Right    lapband  2013   SENTINEL NODE BIOPSY Left 01/07/2021   Procedure: LEFT SENTINEL NODE BIOPSY;  Surgeon: Erroll Luna, MD;  Location: [place];  Service: General;  Laterality: Left;   TONSILLECTOMY  2012    There were no vitals filed for this visit.   Subjective Assessment - 08/21/21 0904     Subjective  The Gabapentin I took this am again - It takes the edge of for the pain - did feel better after Monday for about 24 hrs but then the pain return -pain more on  the outside and top of breast when reaching with shoulder -and at rest    Pertinent History An MRI in September showed a seroma that they are just watching, and don't think it needs to be drained.       Pertinent History left beast cancer with lumpectomy and a second surgery with 2 nodes removed, Pt had 28 treatments of radiation, Pt said the she had some type of reaction after surgery and her left breast swelled severly.  The breast and L arm pai. It occurs when I use my arm more.    Patient Stated Goals I want the pain better so I can use my arm like [place]    Currently in Pain? 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 , sitting and sleeping  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- support it out to side on table or doorknob -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 - not carrying infront of her    Mini massager use this date on L lateral breast and at 3-6 o'clock  on  scar and fibrosis  -and superior 1/2 breast - great response   and focus this date also  on Pect, axilla and anterior upper arm - with great progress in L shoulder AAROM and AROM   ABD and flexion in supine and sidelying Able to add YTB for shoulder ABD and flexion in supine and sidelying after manual therapy  No cording in arm observe now for few wks     Recommend pt  to use her  mini massager  at home and jovipak breast pad - unilateral breast pad under bra or camisole - need  to look for funding assistance - pt insurance not covering Did fit pt 2 wks ago with Komprex foam on L breast to posterior shoulder and chip bag made to use in combination over fibrotic areas to use on L breast   Pain decrease from 4/10 to 1-2/10 and increase  AROM  Pt HEP review and can cont to wear compression sleeve if needed -but her measurement in L arm compare to R - WNL Pt cont with AAROM flexion , ABD  and YTB add supine and sidlying  And cont  RTB for scapula retraction and shoulder extention      AAROM and AROM  for shoulder flexion  and ABD supine 155                    OT Education - 08/21/21 0912     Education Details progress and HEP changes    Person(s) Educated Patient    Methods Explanation;Demonstration;Tactile cues;Verbal cues    Comprehension Returned demonstration;Verbalized understanding;Verbal cues required                 OT Long Term Goals - 07/17/21 1920       OT LONG TERM GOAL #1   Title Pt will report the pain in her left upper quadrant is reduced to 2/10 at the most    Baseline pain in L breast, axilla and into arm 6/10 - limiing her AROM and use of L arm    Time 4    Period Weeks    Status New    Target Date 08/14/21      OT LONG TERM GOAL #2   Title Pt will be independent in a home exericse program  to decrease scar tissue , pain and increase  left shoudler AROM and strength    Baseline have some knowledge using bands but still cradle when pain - do some scar massage - pain still 6/10 and tender breast at scar , upper L breast and axilla into arm    Time 6    Period Weeks    Status New    Target Date 08/28/21      OT LONG TERM GOAL #3   Title Pt will report  knowledgable about lymphedem risk reduction practices    Baseline review this date with pt - cont to need into  Time 4    Period Weeks    Status New    Target Date 08/14/21                   Plan - 08/21/21 0913     Clinical Impression Statement Pt  had L lumpectomy in April 22- and radiation - was seen in [place] by PT for L UE pain, stiffness and cording. Pt transfered her care 3-4 wks ago  to OT at [facility]  that is closer to home. Pt  pain at [facility] with me was in L breast, axilla, pect and arm to  volar elbow.Pt is better in the arm - now more in breast and into axilla, pect and upper arm - focussing on scar tissue, fibrosis in L breast . L shoulder PROM and AROM improve this week greatly in session after soft tissue and Gabapentin.  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 Also adress sleeping positioning for scapula positioning. Pt to cont with  RTB for scapula retraction and shoulder ext pain free.  Add this date in supine and sidelying shoulder flexion and ABD with YTB 12 reps painfree. Pt can wear her [employer] El[employer] soft sleeve and gauntlet as needed but her lymphedema measurements  looking WNL compare to the R. I do feel  she will benefit more from jovipak breast  pad to use for fibrosis in  L breast. Await response back from [employer] - fitted pt 2 wks ago week  with Komprex foam over L breast to posterior shoulder with chip bag to use in combination over fibrotic areas on breast- reinforce again use of it.  Pt responded great  with soft tissue with mini massager on superior and lateral  breast again with increase ROM and less pain.  Also used on pect and upper arm with great success today - AROM for shoulder in supine to 155 degrees flexion and ABD - Reinforce for pt to use her mini massager  at home on L  breast fibrosis and chip bag at home to get carry over from session. She report she did get one this past weekend but did not use it yet.  Pt has limitations in Left shoulder AROM by increased pain from soft tissue fibrosis in L breast, pect and axilla more than cording.  She continues to complain of  pect,  breast , axillary and upper arm pain.   She will benefit from continued OT to address cording, ROM limtations strenght to increase use of L dominant hand in ADl's and IADl's    OT Occupational Profile and History Problem Focused Assessment - Including review of records relating to presenting problem    Occupational performance deficits (Please refer to evaluation for details): ADL's;IADL's;Rest and Sleep;Work;Play;Leisure;Social Participation    Body Structure / Function / Physical Skills ADL;Decreased knowledge of precautions;Skin integrity;Flexibility;Scar mobility;IADL;ROM;Edema;UE functional use;Pain    Rehab Potential Fair    Clinical Decision Making Limited treatment options, no task modification necessary    Comorbidities Affecting Occupational Performance: None    Modification or Assistance to Complete Evaluation  No modification of tasks or assist necessary to complete eval    OT Frequency --   1-2 x wk depending on progress   OT Duration 4 weeks    OT Treatment/Interventions Self-care/ADL training;Manual lymph drainage;Therapeutic activities;Contrast Bath;Therapeutic exercise;Scar mobilization;Passive range of motion;Manual  Therapy;Patient/family education    Consulted and Agree with Plan of Care Patient             Patient will benefit from skilled therapeutic intervention in order to improve the following deficits and impairments:   Body Structure / Function / Physical Skills: ADL, Decreased knowledge of precautions, Skin integrity, Flexibility, Scar mobility, IADL, ROM, Edema, UE functional use, Pain       Visit Diagnosis: Stiffness of left shoulder, not elsewhere classified  Aftercare following surgery for neoplasm  Disorder of the skin and subcutaneous tissue related to radiation, unspecified  Muscle weakness (generalized)    Problem List Patient Active Problem List   Diagnosis Date Noted   Prediabetes 03/27/2021   Class 2 severe obesity due to excess calories  with serious comorbidity and body mass index (BMI) of 36.0 to 36.9 in adult ([place]) 03/27/2021   B12 deficiency anemia 01/19/2021   PVC (premature ventricular contraction) 01/17/2021   Iron deficiency anemia 01/17/2021   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 [place], [address] Phone: [phone]   Fax:  [phone]  Name: [name] MRN: [id number] Date of Birth: November 25, 1970

## 2021-09-02 ENCOUNTER — Other Ambulatory Visit: Payer: Self-pay | Admitting: Hematology

## 2021-09-05 IMAGING — MG MM PLC BREAST LOC DEV 1ST LESION INC MAMMO GUIDE*L*
8 of 9 series · 8 of 9 positions shown · non-contrast
Comparison: Previous exam(s).

CLINICAL DATA: Status post ultrasound-guided biopsy with a benign
pathology result deemed discordant by the performing radiologist
with recommendation for surgical excision. Patient is scheduled for
surgical excision requiring preoperative radioactive seed
localization.

EXAM:
MAMMOGRAPHIC GUIDED RADIOACTIVE SEED LOCALIZATION OF THE LEFT BREAST

[L LM (1 of 4)]
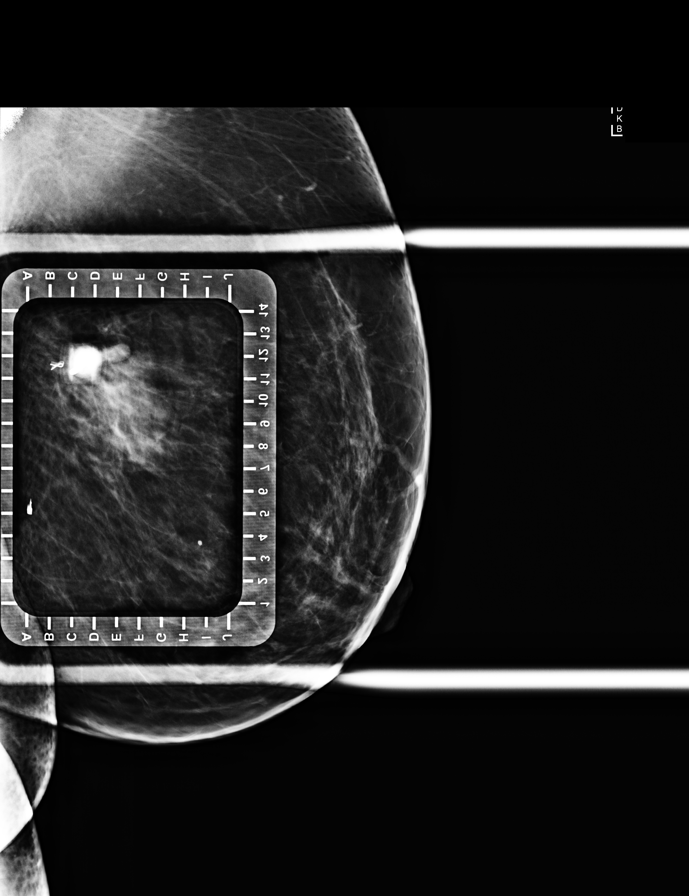

[L ML]
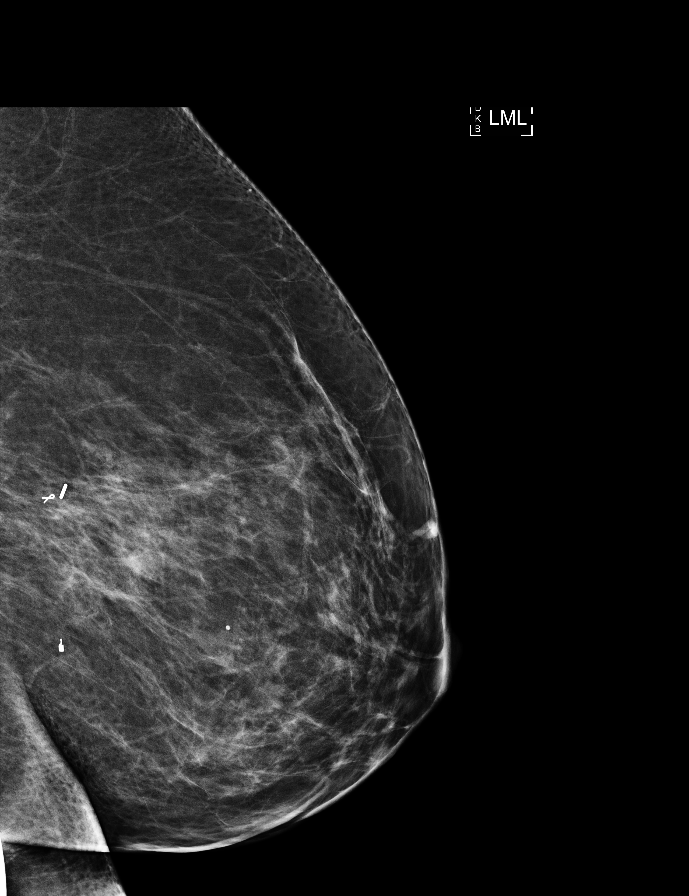

[L LM (2 of 4)]
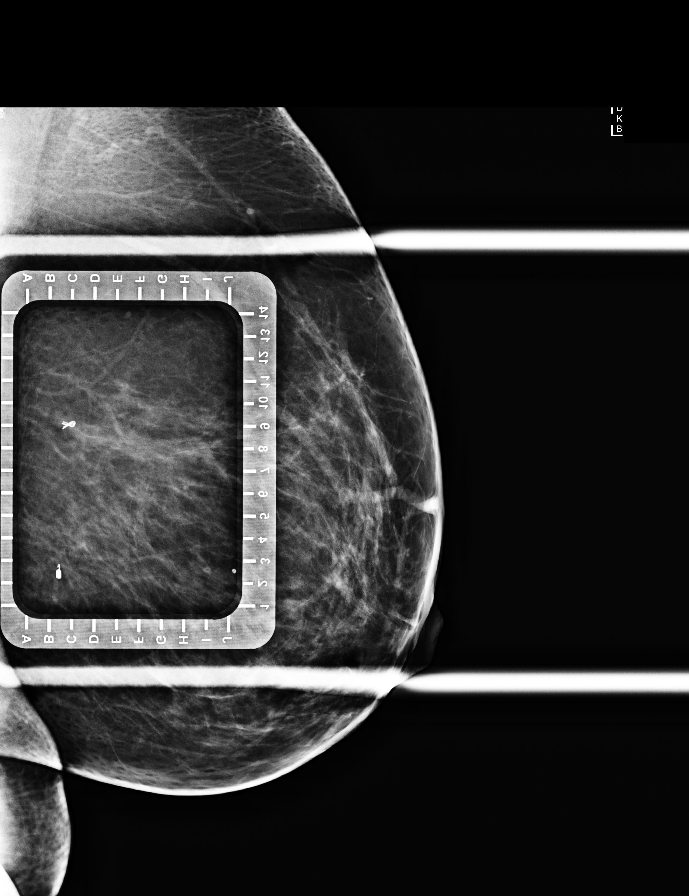

[L CC (1 of 3)]
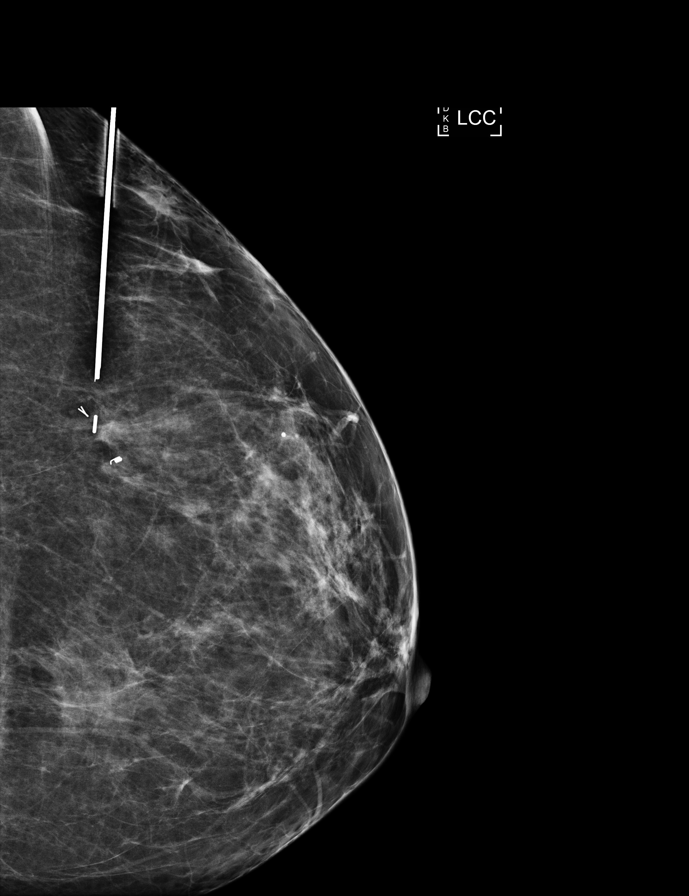

[L CC (2 of 3)]
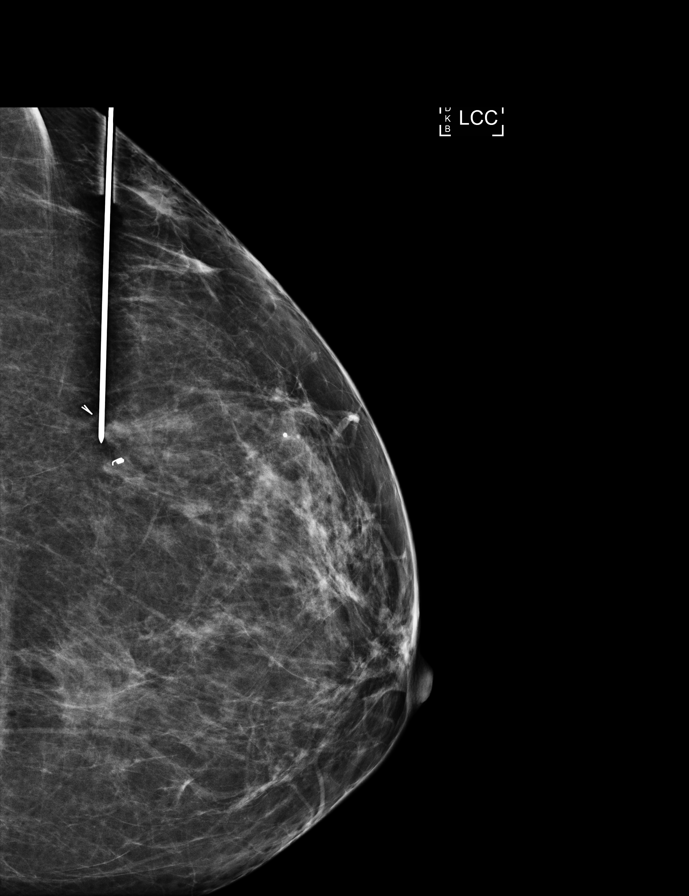

[L LM (3 of 4)]
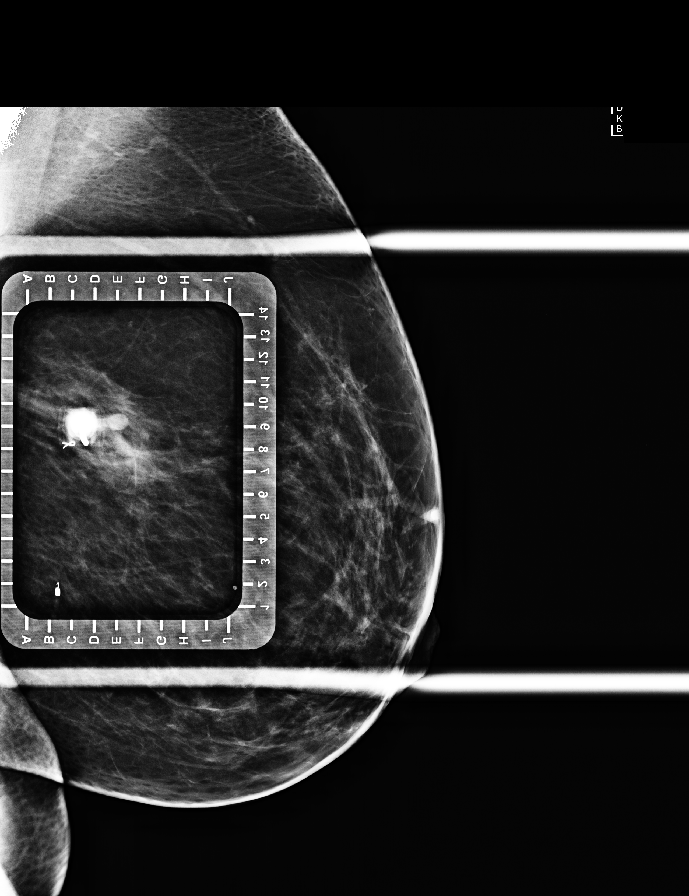

[L LM (4 of 4)]
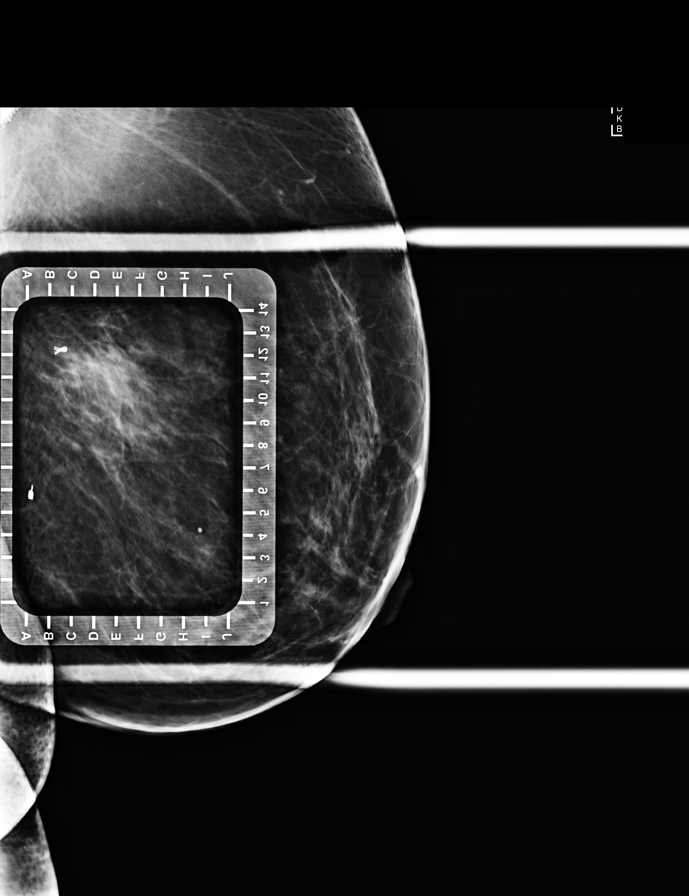

[L CC (3 of 3)]
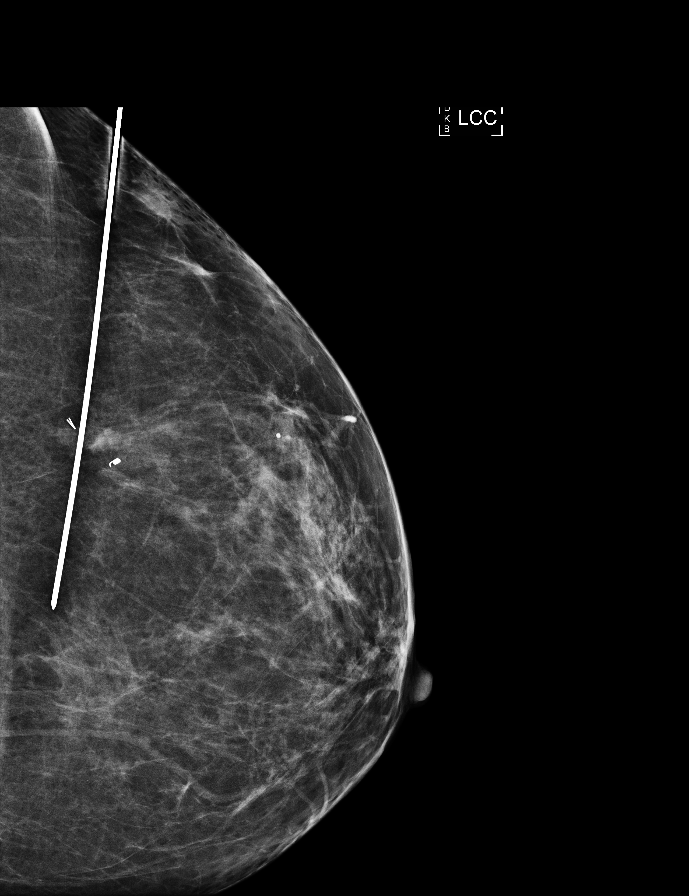

[8 of 9 positions shown; findings below may reference images not displayed]

FINDINGS: Patient presents for radioactive seed localization prior to surgical
excision. I met with the patient and we discussed the procedure of
seed localization including benefits and alternatives. We discussed
the high likelihood of a successful procedure. We discussed the
risks of the procedure including infection, bleeding, tissue injury
and further surgery. We discussed the low dose of radioactivity
involved in the procedure. Informed, written consent was given.

The usual time-out protocol was performed immediately prior to the
procedure.

Using mammographic guidance, sterile technique, 1% lidocaine and an
O-4DE radioactive seed, the medial aspect of the ribbon shaped clip
was localized using a lateral approach. The follow-up mammogram
images confirm the seed in the expected location and were marked for
Dr. Yoel.

Follow-up survey of the patient confirms presence of the radioactive
seed.

Order number of O-4DE seed:  [id number].

Total activity:  0.245 millicuries reference Date: 11/06/2020

The patient tolerated the procedure well and was released from the
[REDACTED]. She was given instructions regarding seed removal.
IMPRESSION: Radioactive seed localization left breast. No apparent
complications.

## 2021-09-09 ENCOUNTER — Encounter: Payer: Self-pay | Admitting: Nurse Practitioner

## 2021-09-11 ENCOUNTER — Inpatient Hospital Stay: Payer: BC Managed Care – PPO | Attending: Oncology

## 2021-09-16 ENCOUNTER — Encounter: Payer: BC Managed Care – PPO | Admitting: Occupational Therapy

## 2021-09-18 ENCOUNTER — Encounter: Payer: BC Managed Care – PPO | Admitting: Occupational Therapy

## 2021-09-22 ENCOUNTER — Ambulatory Visit: Payer: BC Managed Care – PPO | Admitting: Occupational Therapy

## 2021-09-24 ENCOUNTER — Encounter: Payer: BC Managed Care – PPO | Admitting: Occupational Therapy

## 2021-09-24 ENCOUNTER — Other Ambulatory Visit: Payer: Self-pay | Admitting: Internal Medicine

## 2021-09-24 NOTE — Telephone Encounter (Signed)
Requested Prescriptions  Pending Prescriptions Disp Refills   valACYclovir (VALTREX) 500 MG tablet [Pharmacy Med Name: VALACYCLOVIR HCL 500 MG TABLET] 90 tablet 0    Sig: TAKE 1 TABLET (500 MG TOTAL) BY MOUTH DAILY AS NEEDED (HERPES SIMPLEX).     Antimicrobials:  Antiviral Agents - Anti-Herpetic Passed - 09/24/2021  1:28 AM      Passed - Valid encounter within last 12 months    Recent Outpatient Visits          2 months ago Other fatigue   [facility] [place], Coralie Keens, NP   3 months ago Viral URI   [facility] [place], [place], NP   6 months ago Acute non-recurrent frontal sinusitis   [facility] [place], Coralie Keens, NP   8 months ago Encounter for general adult medical examination with abnormal findings   [facility] [place], Coralie Keens, NP      Future Appointments            In 1 month Deboraha Sprang, MD [facility] [place]

## 2021-10-07 ENCOUNTER — Ambulatory Visit: Payer: BC Managed Care – PPO | Attending: Nurse Practitioner | Admitting: Occupational Therapy

## 2021-10-07 ENCOUNTER — Other Ambulatory Visit: Payer: Self-pay

## 2021-10-07 DIAGNOSIS — Z483 Aftercare following surgery for neoplasm: Secondary | ICD-10-CM | POA: Diagnosis present

## 2021-10-07 DIAGNOSIS — N644 Mastodynia: Secondary | ICD-10-CM | POA: Insufficient documentation

## 2021-10-07 DIAGNOSIS — L905 Scar conditions and fibrosis of skin: Secondary | ICD-10-CM | POA: Insufficient documentation

## 2021-10-07 DIAGNOSIS — M25612 Stiffness of left shoulder, not elsewhere classified: Secondary | ICD-10-CM | POA: Insufficient documentation

## 2021-10-07 NOTE — Therapy (Signed)
[place] [facility] [address] [place], [address] Phone: [phone]   Fax:  [phone]  Occupational Therapy Treatment  Patient Details  Name: [name] MRN: [id number] Date of Birth: Nov 12, 1970 Referring Provider (OT): Dara Lords   Encounter Date: 10/07/2021   OT End of Session - 10/07/21 1725     Visit Number 8    Number of Visits 16    Date for OT Re-Evaluation 12/02/21    OT Start Time 7782    OT Stop Time 1717    OT Time Calculation (min) 60 min    Activity Tolerance Patient tolerated treatment well    Behavior During Therapy [facility] for tasks assessed/performed             Past Medical History:  Diagnosis Date   Allergy    Anemia    present for many years, mild, stable   Anxiety    Breast cancer ([place])    Breast cancer ([place]) 12/18/2020   Dysrhythmia    PAC's on flecanide (did not tolerate beta blockers)   Family history of breast cancer 01/20/2021   Family history of prostate cancer 01/20/2021   Frequent headaches    HSV infection    fever blisters   Hypertension    Hypothyroidism     Past Surgical History:  Procedure Laterality Date   APPENDECTOMY  2013   BREAST LUMPECTOMY WITH RADIOACTIVE SEED LOCALIZATION Left 12/18/2020   Procedure: LEFT BREAST LUMPECTOMY WITH RADIOACTIVE SEED LOCALIZATION;  Surgeon: Erroll Luna, MD;  Location: [facility];  Service: General;  Laterality: Left;   KNEE ARTHROSCOPY Right    lapband  2013   SENTINEL NODE BIOPSY Left 01/07/2021   Procedure: LEFT SENTINEL NODE BIOPSY;  Surgeon: Erroll Luna, MD;  Location: [facility];  Service: General;  Laterality: Left;   TONSILLECTOMY  2012    There were no vitals filed for this visit.   Subjective Assessment - 10/07/21 1722     Subjective  I did see the surgeon and they up my Gabapentin -and  helped some -and maybe the muscle relaxer  help me maybe sleep - but I took care of my mom in [place] and  did  not pay attention to my pain -did not had time -she past away 2 wks ago -so it has been rough since before Christmas    Pertinent History An MRI in September showed a seroma that they are just watching, and don't think it needs to be drained.       Pertinent History left beast cancer with lumpectomy and a second surgery with 2 nodes removed, Pt had 28 treatments of radiation, Pt said the she had some type of reaction after surgery and her left breast swelled severly.  The breast and L arm pai. It occurs when I use my arm more.    Patient Stated Goals I want the pain better so I can use my arm like [place]    Currently in Pain? Yes    Pain Score 4    8/10 pect, breast  and into anterior upper arm                LYMPHEDEMA/ONCOLOGY QUESTIONNAIRE - 10/07/21 0001       Right Upper Extremity Lymphedema   15 cm Proximal to Olecranon Process 37.5 cm (P)     10 cm Proximal to Olecranon Process 35.5 cm (P)     Olecranon Process 27 cm (P)  15 cm Proximal to Ulnar Styloid Process 25 cm (P)     Just Proximal to Ulnar Styloid Process 2 cm (P)       Left Upper Extremity Lymphedema   15 cm Proximal to Olecranon Process 38.5 cm (P)     10 cm Proximal to Olecranon Process 37.5 cm (P)     Olecranon Process 27.3 cm (P)     15 cm Proximal to Ulnar Styloid Process 24.5 cm (P)     Just Proximal to Ulnar Styloid Process 17.4 cm (P)              Coming in pain 4/10 rest and with ext rotation and ABD 8/10 pull over top of breast into pect and anterior upper arm L shoulder Flexion 125 and ABD 105     Pain and fibrosis mostly this date on medial  breast  from 8 to 12 o'clock - little area under scar on lateral breast  and into pect and anterior upper arm with over head shoulder flexion , ext rotation and  ABD  Pt was ed on the past on positioning and posture of L shoulder during day , 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 , sitting and sleeping  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- support it out to side on table or doorknob -and 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 - not carrying infront of her    Mini massager use this date on L lateral breast from 8 -12 o'clock  on  scar and fibrosis  -and under scar on lateral breast with great response   and focus this date also  on pect trigger point release, soft tissue and anterior upper arm  great progress in L shoulder AAROM and AROM   ABD and flexion in supine sitting increase to 155  to 160 degrees  No cording in arm observe now for few wks     Recommend pt  to use her  mini massager  at home and  made her new chip bag again to use  over fibrotic areas to use on L breast   Pain decrease from 4/-810 to 1-2/10 and increase  AROM       AAROM and AROM  for shoulder flexion  and ABD stretches to be done on wall after shower to maintain her progress                OT Education - 10/07/21 1725     Education Details progress and HEP changes    Person(s) Educated Patient    Methods Explanation;Demonstration;Tactile cues;Verbal cues    Comprehension Returned demonstration;Verbalized understanding;Verbal cues required                 OT Long Term Goals - 10/07/21 1736       OT LONG TERM GOAL #1   Title Pt will report the pain in her left upper quadrant is reduced to 2/10 at the most    Baseline pain in L breast, axilla and into arm 6/10 - limiing her AROM and use of L arm- NOW  pain cont to be 4-8/10 in breast , pect and upper arm    Time 6    Period Weeks    Status On-going    Target Date 11/18/21      OT LONG TERM GOAL #2   Title Pt will be independent in a home exericse program  to decrease scar tissue , pain and increase  left shoudler AROM and strength    Baseline pain still  4-8/10 -and pt do have knowledge but hard time doing at home - work schedule , mom was sick and had to travel several times to [facility],  was then on hospice and past away 2 wks ago - was not in therapy for about 6 wks    Time 8    Period Weeks    Status On-going    Target Date  12/02/21      OT LONG TERM GOAL #3   Title Pt will report  knowledgable about lymphedem risk reduction practices    Status Achieved                   Plan - 10/07/21 1726     Clinical Impression Statement Pt had L lumpectomy in April 22- and radiation - was seen in [place] by PT for L UE pain, stiffness and cording until about Oct 22 and then transfer care to OT at [facility]  that is closer to home.Has been focussing since Nov 22 on pain in L breast, axilla, pect and arm to  volar elbow. Pt return this date after not seen for 6 wks while she was out of town taking care of her mom that was getting Hospice and past away 2 wks ago.Pain in L breast from 8 to 12  o'oclock- fibrosis /scar tissue very tender and pull over superior breast , pect into anterior upper arm. Focus this date on scar tissue, fibrosis in L breast and trigger point release on pect and anterior upper shoulder. Pain decrease greaty afterl soft tissue ,. L shoulder PROM and AROM improve in session after soft tissue. Pt has hard time maintaining progress in session - Reinforce for pt to use her mini massager  at home on L  breast fibrosis and chip bag. Pt has limitations in Left shoulder AROM by increased pain from soft tissue fibrosis in L breast, pect  more than cording. No lymphedema in L UE - measurements WNL.  She continues to complain of  pect, breast , and upper arm pain.   She will benefit from continued OT until appt with surgeon to address pain and limitations in ROM and  strenght to in L dominant hand limiting her use  ADl's and IADl's    OT Occupational Profile and History Problem Focused Assessment - Including review of records relating to presenting problem    Occupational performance deficits (Please refer to evaluation for details): ADL's;IADL's;Rest and Sleep;Work;Play;Leisure;Social Participation    Body Structure / Function / Physical Skills ADL;Decreased knowledge of precautions;Skin  integrity;Flexibility;Scar mobility;IADL;ROM;Edema;UE functional use;Pain    Rehab Potential Fair    Clinical Decision Making Limited treatment options, no task modification necessary    Comorbidities Affecting Occupational Performance: None    Modification or Assistance to Complete Evaluation  No modification of tasks or assist necessary to complete eval    OT Frequency --   1-2 x wk depending on her work schedule and pain   OT Duration 8 weeks    OT Treatment/Interventions Self-care/ADL training;Manual lymph drainage;Therapeutic activities;Contrast Bath;Therapeutic exercise;Scar mobilization;Passive range of motion;Manual Therapy;Patient/family education    Consulted and Agree with Plan of Care Patient             Patient will benefit from skilled therapeutic intervention in order to improve the following deficits and impairments:   Body Structure / Function / Physical Skills: ADL, Decreased knowledge of precautions, Skin integrity, Flexibility, Scar mobility, IADL, ROM, Edema, UE functional use, Pain  Visit Diagnosis: Stiffness of left shoulder, not elsewhere classified - Plan: Ot plan of care cert/re-cert  Aftercare following surgery for neoplasm - Plan: Ot plan of care cert/re-cert  Scar condition and fibrosis of skin - Plan: Ot plan of care cert/re-cert  Pain of left breast - Plan: Ot plan of care cert/re-cert    Problem List Patient Active Problem List   Diagnosis Date Noted   Prediabetes 03/27/2021   Class 2 severe obesity due to excess calories with serious comorbidity and body mass index (BMI) of 36.0 to 36.9 in adult ([place]) 03/27/2021   B12 deficiency anemia 01/19/2021   PVC (premature ventricular contraction) 01/17/2021   Iron deficiency anemia 01/17/2021   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 [place], [address] Phone: [phone]   Fax:  [phone]  Name: [name] MRN: [id number] Date of Birth: 1971-03-09

## 2021-10-09 ENCOUNTER — Inpatient Hospital Stay: Payer: BC Managed Care – PPO | Attending: Oncology

## 2021-10-12 ENCOUNTER — Encounter: Payer: Self-pay | Admitting: Hematology

## 2021-10-14 ENCOUNTER — Other Ambulatory Visit: Payer: Self-pay

## 2021-10-14 ENCOUNTER — Ambulatory Visit: Payer: BC Managed Care – PPO | Attending: Nurse Practitioner | Admitting: Occupational Therapy

## 2021-10-14 DIAGNOSIS — Z483 Aftercare following surgery for neoplasm: Secondary | ICD-10-CM | POA: Diagnosis present

## 2021-10-14 DIAGNOSIS — M6281 Muscle weakness (generalized): Secondary | ICD-10-CM | POA: Insufficient documentation

## 2021-10-14 DIAGNOSIS — L599 Disorder of the skin and subcutaneous tissue related to radiation, unspecified: Secondary | ICD-10-CM | POA: Diagnosis present

## 2021-10-14 DIAGNOSIS — N644 Mastodynia: Secondary | ICD-10-CM | POA: Diagnosis present

## 2021-10-14 DIAGNOSIS — M25612 Stiffness of left shoulder, not elsewhere classified: Secondary | ICD-10-CM

## 2021-10-14 DIAGNOSIS — L905 Scar conditions and fibrosis of skin: Secondary | ICD-10-CM | POA: Diagnosis present

## 2021-10-14 NOTE — Therapy (Signed)
[facility] [address] [place], [address] Phone: [phone]   Fax:  [phone]  Occupational Therapy Treatment  Patient Details  Name: [name] MRN: [id number] Date of Birth: [birth year] Referring Provider (OT): Dara Lords   Encounter Date: 10/14/2021   OT End of Session - 10/14/21 1856     Visit Number 9    Number of Visits 16    Date for OT Re-Evaluation 12/02/21    OT Start Time 1627    OT Stop Time 1710    OT Time Calculation (min) 43 min    Activity Tolerance Patient tolerated treatment well    Behavior During Therapy [facility] for tasks assessed/performed             Past Medical History:  Diagnosis Date   Allergy    Anemia    present for many years, mild, stable   Anxiety    Breast cancer ([place])    Breast cancer ([place]) 12/18/2020   Dysrhythmia    PAC's on flecanide (did not tolerate beta blockers)   Family history of breast cancer 01/20/2021   Family history of prostate cancer 01/20/2021   Frequent headaches    HSV infection    fever blisters   Hypertension    Hypothyroidism     Past Surgical History:  Procedure Laterality Date   APPENDECTOMY  2013   BREAST LUMPECTOMY WITH RADIOACTIVE SEED LOCALIZATION Left 12/18/2020   Procedure: LEFT BREAST LUMPECTOMY WITH RADIOACTIVE SEED LOCALIZATION;  Surgeon: Erroll Luna, MD;  Location: [facility];  Service: General;  Laterality: Left;   KNEE ARTHROSCOPY Right    lapband  2013   SENTINEL NODE BIOPSY Left 01/07/2021   Procedure: LEFT SENTINEL NODE BIOPSY;  Surgeon: Erroll Luna, MD;  Location: [facility];  Service: General;  Laterality: Left;   TONSILLECTOMY  2012    There were no vitals filed for this visit.   Subjective Assessment - 10/14/21 1855     Subjective  I felt good about day or 2 after I seen you - did drive up to [place] and [place] to check on my aunt that had stroke - and done my posture exercises -I am okay now  because I took some pain medication for UTI few hours ago    Pertinent History An MRI in September showed a seroma that they are just watching, and don't think it needs to be drained.       Pertinent History left beast cancer with lumpectomy and a second surgery with 2 nodes removed, Pt had 28 treatments of radiation, Pt said the she had some type of reaction after surgery and her left breast swelled severly.  The breast and L arm pai. It occurs when I use my arm more.    Patient Stated Goals I want the pain better so I can use my arm like befort    Currently in Pain? 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 - not carrying infront of her    Mini massager use this date on  scar and fibrosis  -focus on pect trigger point release, soft tissue and anterior upper arm  great progress in L shoulder AARO  on wall today for flexion and ABD  Pt to cont with AAROM on wall at home daily  No cording in arm observe now for few wks      Recommend pt  to use her  mini massager  at home and  made her new chip bag again  last time to use  over fibrotic areas to use on L breast          AAROM and AROM  for shoulder flexion  and ABD stretches to be done on wall after shower to maintain her progress                       OT Education - 10/14/21 1856     Education Details progress and HEP changes    Person(s) Educated Patient    Methods Explanation;Demonstration;Tactile  cues;Verbal cues    Comprehension Returned demonstration;Verbalized understanding;Verbal cues required                 OT Long Term Goals - 10/07/21 1736       OT LONG TERM GOAL #1   Title Pt will report the pain in her left upper quadrant is reduced to 2/10 at the most    Baseline pain in L breast, axilla and into arm 6/10 - limiing her AROM and use of L arm- NOW  pain cont to be 4-8/10 in breast , pect and upper arm    Time 6    Period Weeks    Status On-going    Target Date 11/18/21      OT LONG TERM GOAL #2   Title Pt will be independent in a home exericse program  to decrease scar tissue , pain and increase  left shoudler AROM and strength    Baseline pain still 4-8/10 -and pt do have knowledge but hard time doing at home - work schedule , mom was sick and had to travel several times to [facility],  was then on hospice and past away 2 wks ago - was not in therapy for about 6 wks    Time 8    Period Weeks    Status On-going    Target Date 12/02/21      OT LONG TERM GOAL #3   Title Pt will report  knowledgable about lymphedem risk reduction practices    Status Achieved                   Plan - 10/14/21 1857     Clinical Impression Statement Pt had L lumpectomy in April 22- and radiation - was seen in [place] by PT for L UE pain, stiffness and cording until about Oct 22 and then transfer to OT closer to home and had been focussing since Nov 22 on pain in L breast, axilla, pect and arm to  volar elbow. Pt was not seen for 6 wks  in Dec /Jan when she was out of town taking care of her mom that was getting Hospice and past away middle January. Last week had Pain in L breast  from 8 to 12  o'oclock- fibrosis /scar tissue very tender and pull over superior breast , pect into anterior upper arm. This date came in with increase AROM compare to last week but could focus this date deeper scar tissue from 8 to 1 o'oclock, fibrosis in L breast and trigger point release on pect and  anterior upper shoulder. Pain decrease greaty afterl soft tissue ,. L shoulder PROM and AROM improve in session after soft tissue. Pt has hard time maintaining progress from one session to next. Reinforce for pt to use her mini massager  at home on L  breast fibrosis and chip bag.  As well as shoulder flexion and ABD AAROM on wall daily.. No lymphedema in L UE - measurements WNL.  She continues to complain of  pect, breast pain more than upper arm pain.   She will benefit from continued OT until appt with surgeon to address pain and limitations in ROM and  strenght to in L dominant hand limiting her use  ADl's and IADl's    OT Occupational Profile and History Problem Focused Assessment - Including review of records relating to presenting problem    Occupational performance deficits (Please refer to evaluation for details): ADL's;IADL's;Rest and Sleep;Work;Play;Leisure;Social Participation    Body Structure / Function / Physical Skills ADL;Decreased knowledge of precautions;Skin integrity;Flexibility;Scar mobility;IADL;ROM;Edema;UE functional use;Pain    Rehab Potential Fair    Clinical Decision Making Limited treatment options, no task modification necessary    Comorbidities Affecting Occupational Performance: None    Modification or Assistance to Complete Evaluation  No modification of tasks or assist necessary to complete eval    OT Frequency --   1-2 x wk depending on work schedule and work   OT Duration --   7 wks   OT Treatment/Interventions Self-care/ADL training;Manual lymph drainage;Therapeutic activities;Contrast Bath;Therapeutic exercise;Scar mobilization;Passive range of motion;Manual Therapy;Patient/family education    Consulted and Agree with Plan of Care Patient             Patient will benefit from skilled therapeutic intervention in order to improve the following deficits and impairments:   Body Structure / Function / Physical Skills: ADL, Decreased knowledge of precautions, Skin  integrity, Flexibility, Scar mobility, IADL, ROM, Edema, UE functional use, Pain       Visit Diagnosis: Stiffness of left shoulder, not elsewhere classified  Aftercare following surgery for neoplasm  Scar condition and fibrosis of skin  Pain of left breast    Problem List Patient Active Problem List   Diagnosis Date Noted   Prediabetes 03/27/2021   Class 2 severe obesity due to excess calories with serious comorbidity and body mass index (BMI) of 36.0 to 36.9 in adult ([place]) 03/27/2021   B12 deficiency anemia 01/19/2021   PVC (premature ventricular contraction) 01/17/2021   Iron deficiency anemia 01/17/2021   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 [place], [address] Phone: [phone]   Fax:  [phone]  Name: [name] MRN: [id number] Date of Birth: 12/11/1970

## 2021-10-15 ENCOUNTER — Telehealth: Payer: Self-pay | Admitting: Nurse Practitioner

## 2021-10-15 NOTE — Telephone Encounter (Signed)
Sch per 2/8 inbasket, left msg

## 2021-10-16 ENCOUNTER — Ambulatory Visit: Payer: BC Managed Care – PPO | Admitting: Occupational Therapy

## 2021-10-17 ENCOUNTER — Other Ambulatory Visit: Payer: Self-pay

## 2021-10-17 ENCOUNTER — Inpatient Hospital Stay: Payer: BC Managed Care – PPO | Attending: Hematology

## 2021-10-17 ENCOUNTER — Inpatient Hospital Stay: Payer: BC Managed Care – PPO

## 2021-10-17 DIAGNOSIS — E611 Iron deficiency: Secondary | ICD-10-CM | POA: Diagnosis present

## 2021-10-17 DIAGNOSIS — D519 Vitamin B12 deficiency anemia, unspecified: Secondary | ICD-10-CM

## 2021-10-17 DIAGNOSIS — E538 Deficiency of other specified B group vitamins: Secondary | ICD-10-CM | POA: Diagnosis not present

## 2021-10-17 DIAGNOSIS — D509 Iron deficiency anemia, unspecified: Secondary | ICD-10-CM

## 2021-10-17 LAB — CBC WITH DIFFERENTIAL/PLATELET
Abs Immature Granulocytes: 0.01 10*3/uL (ref 0.00–0.07)
Basophils Absolute: 0.1 10*3/uL (ref 0.0–0.1)
Basophils Relative: 2 %
Eosinophils Absolute: 0.1 10*3/uL (ref 0.0–0.5)
Eosinophils Relative: 3 %
HCT: 38.7 % (ref 36.0–46.0)
Hemoglobin: 13.2 g/dL (ref 12.0–15.0)
Immature Granulocytes: 0 %
Lymphocytes Relative: 33 %
Lymphs Abs: 1.5 10*3/uL (ref 0.7–4.0)
MCH: 30.2 pg (ref 26.0–34.0)
MCHC: 34.1 g/dL (ref 30.0–36.0)
MCV: 88.6 fL (ref 80.0–100.0)
Monocytes Absolute: 0.3 10*3/uL (ref 0.1–1.0)
Monocytes Relative: 7 %
Neutro Abs: 2.4 10*3/uL (ref 1.7–7.7)
Neutrophils Relative %: 55 %
Platelets: 241 10*3/uL (ref 150–400)
RBC: 4.37 MIL/uL (ref 3.87–5.11)
RDW: 13.7 % (ref 11.5–15.5)
WBC: 4.5 10*3/uL (ref 4.0–10.5)
nRBC: 0 % (ref 0.0–0.2)

## 2021-10-17 LAB — COMPREHENSIVE METABOLIC PANEL
ALT: 12 U/L (ref 0–44)
AST: 18 U/L (ref 15–41)
Albumin: 3.7 g/dL (ref 3.5–5.0)
Alkaline Phosphatase: 113 U/L (ref 38–126)
Anion gap: 7 (ref 5–15)
BUN: 16 mg/dL (ref 6–20)
CO2: 28 mmol/L (ref 22–32)
Calcium: 8.6 mg/dL — ABNORMAL LOW (ref 8.9–10.3)
Chloride: 100 mmol/L (ref 98–111)
Creatinine, Ser: 0.87 mg/dL (ref 0.44–1.00)
GFR, Estimated: >60 mL/min (ref >60)
Glucose, Bld: 92 mg/dL (ref 70–99)
Potassium: 3.1 mmol/L — ABNORMAL LOW (ref 3.5–5.1)
Sodium: 135 mmol/L (ref 135–145)
Total Bilirubin: 0.4 mg/dL (ref 0.3–1.2)
Total Protein: 6.9 g/dL (ref 6.5–8.1)

## 2021-10-17 LAB — FERRITIN: Ferritin: 34 ng/mL (ref 11–307)

## 2021-10-17 LAB — VITAMIN B12: Vitamin B-12: 311 pg/mL (ref 180–914)

## 2021-10-18 ENCOUNTER — Other Ambulatory Visit: Payer: Self-pay | Admitting: Hematology

## 2021-10-18 ENCOUNTER — Encounter: Payer: Self-pay | Admitting: Nurse Practitioner

## 2021-10-18 MED ORDER — POTASSIUM CHLORIDE CRYS ER 10 MEQ PO TBCR: 10.0000 meq | EXTENDED_RELEASE_TABLET | Freq: Two times a day (BID) | ORAL | 0 refills | Status: DC

## 2021-10-18 NOTE — Progress Notes (Signed)
My nurse, please let pt know that her Iron is slightly low, K also low, B12 level good, no anemia. I will call in KCL pills for her, please schedule B12 injection (not sure why she did not get yesterday with lab) and one dose venofer 400mg  in next few weeks, thanks   Truitt Merle  10/18/2021

## 2021-10-20 NOTE — Progress Notes (Signed)
Patient aware of lab results and agrees to get her prescription for Potassium and begin taking.  Will contact patient related to Venofer infusions for scheduling.  No further questions or concerns at this time.

## 2021-10-22 ENCOUNTER — Other Ambulatory Visit: Payer: Self-pay | Admitting: Internal Medicine

## 2021-10-22 NOTE — Telephone Encounter (Signed)
Requested medication (s) are due for refill today: yes  Requested medication (s) are on the active medication list: no  Last refill:  07/25/21  Future visit scheduled: no  Notes to clinic:  med was dc'd on 08/08/21. Please assess     Requested Prescriptions  Pending Prescriptions Disp Refills   FLUoxetine (PROZAC) 10 MG capsule [Pharmacy Med Name: FLUOXETINE HCL 10 MG CAPSULE] 90 capsule 1    Sig: TAKE 1 CAPSULE BY MOUTH EVERY DAY     Psychiatry:  Antidepressants - SSRI Passed - 10/22/2021  2:52 AM      Passed - Valid encounter within last 6 months    Recent Outpatient Visits           3 months ago Other fatigue   [facility] [place], Coralie Keens, NP   4 months ago Viral URI   [facility] [place], [place], NP   6 months ago Acute non-recurrent frontal sinusitis   [facility] [place], Coralie Keens, NP   9 months ago Encounter for general adult medical examination with abnormal findings   [facility] [place], Coralie Keens, NP       Future Appointments             In 1 week Deboraha Sprang, MD [facility], [place]

## 2021-10-23 ENCOUNTER — Telehealth: Payer: Self-pay | Admitting: Nurse Practitioner

## 2021-10-23 ENCOUNTER — Other Ambulatory Visit: Payer: Self-pay

## 2021-10-23 ENCOUNTER — Ambulatory Visit: Payer: BC Managed Care – PPO | Admitting: Occupational Therapy

## 2021-10-23 DIAGNOSIS — N644 Mastodynia: Secondary | ICD-10-CM

## 2021-10-23 DIAGNOSIS — L905 Scar conditions and fibrosis of skin: Secondary | ICD-10-CM

## 2021-10-23 DIAGNOSIS — M25612 Stiffness of left shoulder, not elsewhere classified: Secondary | ICD-10-CM | POA: Diagnosis not present

## 2021-10-23 DIAGNOSIS — Z483 Aftercare following surgery for neoplasm: Secondary | ICD-10-CM

## 2021-10-23 NOTE — Therapy (Signed)
[facility] [address] [place], [address] Phone: [phone]   Fax:  [phone]  Occupational Therapy PN  07/27/21 to 10/23/21  Patient Details  Name: [name] MRN: [id number] Date of Birth: 1970/10/05 Referring Provider (OT): Dara Lords   Encounter Date: 10/23/2021   OT End of Session - 10/23/21 1759     Visit Number 10    Number of Visits 16    Date for OT Re-Evaluation 12/02/21    OT Start Time 1623    OT Stop Time 1708    OT Time Calculation (min) 45 min    Activity Tolerance Patient tolerated treatment well    Behavior During Therapy [facility] for tasks assessed/performed             Past Medical History:  Diagnosis Date   Allergy    Anemia    present for many years, mild, stable   Anxiety    Breast cancer ([place])    Breast cancer ([place]) 12/18/2020   Dysrhythmia    PAC's on flecanide (did not tolerate beta blockers)   Family history of breast cancer 01/20/2021   Family history of prostate cancer 01/20/2021   Frequent headaches    HSV infection    fever blisters   Hypertension    Hypothyroidism     Past Surgical History:  Procedure Laterality Date   APPENDECTOMY  2013   BREAST LUMPECTOMY WITH RADIOACTIVE SEED LOCALIZATION Left 12/18/2020   Procedure: LEFT BREAST LUMPECTOMY WITH RADIOACTIVE SEED LOCALIZATION;  Surgeon: Erroll Luna, MD;  Location: [facility];  Service: General;  Laterality: Left;   KNEE ARTHROSCOPY Right    lapband  2013   SENTINEL NODE BIOPSY Left 01/07/2021   Procedure: LEFT SENTINEL NODE BIOPSY;  Surgeon: Erroll Luna, MD;  Location: [facility];  Service: General;  Laterality: Left;   TONSILLECTOMY  2012    There were no vitals filed for this visit.   Subjective Assessment - 10/23/21 1757     Subjective  Sorry, I had to cancel last time -had bad migraine -seen the Surgeon and going to follow up in 6 wks again - continue therapy and attend more  consistant and cont with the muscle relaxer, gabapentin and Xanax - and I and beginning seeing counselor too -as well as infusion for my Iron is low    Pertinent History An MRI in September showed a seroma that they are just watching, and don't think it needs to be drained.       Pertinent History left beast cancer with lumpectomy and a second surgery with 2 nodes removed, Pt had 28 treatments of radiation, Pt said the she had some type of reaction after surgery and her left breast swelled severly.  The breast and L arm pai. It occurs when I use my arm more.    Patient Stated Goals I want the pain better so I can use my arm like befort    Currently in Pain? 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  Had UTI last week Pt cont to have pain in L breast  at rest and limiting her shoulder AROM - this date coming in pain at rest in lateral breast 9/10  And lateral breast into axilla and pect Pain limiting her motion in ABD and Flexion          She seen mostly for scar tissue in Jan and Febr medial breast, superior 1/2  with pain into pect and upper arm    Pain /fibrosis this date on lateral breast 2 to 5 o'clock - into axilla - and chest wall - pain at rest 9/10  Focus on mostly this date at 11 o'clock to 2 o'clock could go deeper in this date for fibrosis with arm in ABD , flexion and ext rotation in supine Mini massager use this date on  scars and fibrosis  -focus  soft  tissue a and done xtractor on proximal scar with some light mobs with great progress in L shoulder AAROM for flexion, and ABD in supine and side lying  Pain decrease to 2/10 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 , 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 , sitting and sleeping  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- support it out to 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 - not carrying infront of her      Pt to cont with AAROM in supine and sidelying No cording in arm observe now for few wks      Recommend pt to use her mini massager at home and chip bag over fibrotic areas on L breast                     OT Education - 10/23/21 1759     Education Details progress and HEP changes    Person(s) Educated Patient    Methods Explanation;Demonstration;Tactile cues;Verbal cues    Comprehension Returned demonstration;Verbalized understanding;Verbal cues required                 OT Long Term Goals - 10/07/21 1736       OT LONG TERM GOAL #1   Title Pt will report the pain in her left upper quadrant is reduced to 2/10 at the most    Baseline pain in L breast, axilla and into arm 6/10 - limiing her AROM and use of L arm- NOW  pain cont to be 4-8/10 in breast , pect and upper arm    Time 6    Period Weeks    Status On-going    Target Date 11/18/21      OT LONG TERM GOAL #2   Title Pt will be independent in a home exericse program  to decrease scar tissue , pain and increase  left shoudler AROM and strength    Baseline pain still 4-8/10 -and pt do have knowledge but hard time doing at home - work schedule , mom was sick and had to travel several times to [facility],  was then on hospice and past away 2 wks ago - was not in therapy for about 6 wks    Time 8    Period Weeks    Status On-going    Target Date 12/02/21  OT LONG TERM GOAL #3   Title Pt will report  knowledgable about lymphedem risk reduction practices    Status Achieved                   Plan - 10/23/21  1801     Clinical Impression Statement Pt had L lumpectomy in April 22- and radiation - was seen in [place] by PT for L UE pain, stiffness and cording until about Oct 22 and then transfer to OT closer to home and had been focussing since Nov 22 on pain in L breast, axilla, pect and arm to  volar elbow. Pt was not seen for 6 wks  in Dec /Jan when she was out of town taking care of her mom that was getting Hospice and past away middle January.  Last 2 visits and weeks had Pain in L breast from 8 to 12  o'oclock- fibrosis /scar tissue very tender and pull over superior breast , pect into anterior upper arm with great results in AROM and pain. This date pt  with 9/10 pain in lateral breast ,axilla and some in pect  with shoulder flexion worse than ABD - seen surgeon and next follow up in 6 wks. To try consistant therapy - this date focus on lateral breast from 2 to 5 o'clock . Scar at nipple to scar in axilla - with AAROM sidelying several times to assess. Pain decrease from 9/10 to 2/10 - pt to use her chip bag and do AAROM and AROM in supine or sidelying.  Pt has hard time maintaining progress from one session to next. Reinforce for pt to use her mini massager  at home on L  breast fibrosis and chip bag.  No lymphedema in L UE - measurements WNL.  She continues to complain of  pect, breast pain more than upper arm pain.   She will benefit from continued OT until appt with surgeon to address pain and limitations in ROM and  strenght to in L dominant hand limiting her use  ADl's and IADl's    OT Occupational Profile and History Problem Focused Assessment - Including review of records relating to presenting problem    Occupational performance deficits (Please refer to evaluation for details): ADL's;IADL's;Rest and Sleep;Work;Play;Leisure;Social Participation    Body Structure / Function / Physical Skills ADL;Decreased knowledge of precautions;Skin integrity;Flexibility;Scar mobility;IADL;ROM;Edema;UE functional  use;Pain    Rehab Potential Fair    Clinical Decision Making Limited treatment options, no task modification necessary    Comorbidities Affecting Occupational Performance: None    Modification or Assistance to Complete Evaluation  No modification of tasks or assist necessary to complete eval    OT Frequency --   1-2x wk   OT Duration 6 weeks    OT Treatment/Interventions Self-care/ADL training;Manual lymph drainage;Therapeutic activities;Contrast Bath;Therapeutic exercise;Scar mobilization;Passive range of motion;Manual Therapy;Patient/family education    Consulted and Agree with Plan of Care Patient             Patient will benefit from skilled therapeutic intervention in order to improve the following deficits and impairments:   Body Structure / Function / Physical Skills: ADL, Decreased knowledge of precautions, Skin integrity, Flexibility, Scar mobility, IADL, ROM, Edema, UE functional use, Pain       Visit Diagnosis: Stiffness of left shoulder, not elsewhere classified  Scar condition and fibrosis of skin  Aftercare following surgery for neoplasm  Pain of left breast    Problem List Patient Active Problem List   Diagnosis  Date Noted   Prediabetes 03/27/2021   Class 2 severe obesity due to excess calories with serious comorbidity and body mass index (BMI) of 36.0 to 36.9 in adult ([place]) 03/27/2021   B12 deficiency anemia 01/19/2021   PVC (premature ventricular contraction) 01/17/2021   Iron deficiency anemia 01/17/2021   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  [place], [address] Phone: [phone]   Fax:  [phone]  Name: [name] MRN: [id number] Date of Birth: 06/03/1971

## 2021-10-23 NOTE — Telephone Encounter (Signed)
Sch per 2/15 inbasket, left pt message

## 2021-10-24 MED FILL — Iron Sucrose Inj 20 MG/ML (Fe Equiv): INTRAVENOUS | Qty: 20 | Status: AC

## 2021-10-25 ENCOUNTER — Inpatient Hospital Stay: Payer: BC Managed Care – PPO

## 2021-10-25 ENCOUNTER — Other Ambulatory Visit: Payer: Self-pay

## 2021-10-25 VITALS — BP 154/94 | HR 78 | Temp 98.5°F | Resp 16

## 2021-10-25 DIAGNOSIS — D519 Vitamin B12 deficiency anemia, unspecified: Secondary | ICD-10-CM

## 2021-10-25 DIAGNOSIS — E611 Iron deficiency: Secondary | ICD-10-CM | POA: Diagnosis not present

## 2021-10-25 MED ORDER — CYANOCOBALAMIN 1000 MCG/ML IJ SOLN
1000.0000 ug | Freq: Once | INTRAMUSCULAR | Status: AC
  Administered 2021-10-25: 1000 ug via INTRAMUSCULAR
  Filled 2021-10-25: qty 1

## 2021-10-25 MED ORDER — SODIUM CHLORIDE 0.9 % IV SOLN
400.0000 mg | Freq: Once | INTRAVENOUS | Status: AC
  Administered 2021-10-25: 400 mg via INTRAVENOUS
  Filled 2021-10-25: qty 20

## 2021-10-28 ENCOUNTER — Ambulatory Visit: Payer: BC Managed Care – PPO | Admitting: Occupational Therapy

## 2021-10-29 ENCOUNTER — Ambulatory Visit: Payer: BC Managed Care – PPO

## 2021-10-30 ENCOUNTER — Encounter: Payer: BC Managed Care – PPO | Admitting: Occupational Therapy

## 2021-10-30 ENCOUNTER — Ambulatory Visit: Payer: BC Managed Care – PPO | Admitting: Internal Medicine

## 2021-10-30 ENCOUNTER — Other Ambulatory Visit: Payer: Self-pay

## 2021-10-30 ENCOUNTER — Encounter: Payer: Self-pay | Admitting: Internal Medicine

## 2021-10-30 VITALS — BP 140/98 | HR 77 | Ht 66.0 in | Wt 207.0 lb

## 2021-10-30 DIAGNOSIS — I10 Essential (primary) hypertension: Secondary | ICD-10-CM | POA: Diagnosis not present

## 2021-10-30 DIAGNOSIS — I491 Atrial premature depolarization: Secondary | ICD-10-CM | POA: Diagnosis not present

## 2021-10-30 DIAGNOSIS — I4729 Other ventricular tachycardia: Secondary | ICD-10-CM

## 2021-10-30 NOTE — Patient Instructions (Signed)

## 2021-10-30 NOTE — Progress Notes (Signed)
Patient Care Team: Jearld Fenton, NP as PCP - General (Internal Medicine) Minna Merritts, MD as PCP - Cardiology (Cardiology) Erroll Luna, MD as Consulting Physician (General Surgery) Rockwell Germany, RN as Oncology Nurse Navigator Mauro Kaufmann, RN as Oncology Nurse Navigator Truitt Merle, MD as Consulting Physician (Hematology) Alla Feeling, NP as Nurse Practitioner (Nurse Practitioner) Kyung Rudd, MD as Consulting Physician (Radiation Oncology)   HPI  [name] is a 52 y.o. female Seen in follow-up for palpitations attributed to PACs.  She also has nonsustained ventricular tachycardia.  Cardiac evaluation has included an echocardiogram that was normal; signal-averaged ECG are pending.  Initiated flecainide with significant improvement which has been sustained.  She is unaware of her palpitations at this point.   Intercurrently she was diagnosed with breast cancer underwent a lumpectomy and a lymph node resection.  Treated with radiation associated with blistering and now fibrous tissue and unfortunately significant pain.  Her mother also died intercurrently of breast cancer 1/23.  Apparently a difficult relationship but she went and helped care for her mother over Christmas until she died.  Impressive.  She is now working on her own mental health recovery from the difficult undertaking     DATE TEST EF    10/20 Echo   60-65 %    4/21 cMRI 53% LGE neg   5/22 Echo  55-60%      Date Cr K Hgb TSH  4/20 1.04 4.2  5.73 (7/20)  11/20      0.65  6/21 1.10 3.7 10.3 0.73  2/23    0.  Heather87 3.1 13.2    DATE PR interval QRSduration Dose  4/21  126 90 Flec 0   8/21 132 82 50  2/22 114 80 50  2/23 140 86 50      Records and Results Reviewed   Past Medical History:  Diagnosis Date   Allergy    Anemia    present for many years, mild, stable   Anxiety    Breast cancer ([place])    Breast cancer ([place]) 12/18/2020   Dysrhythmia    PAC's on  flecanide (did not tolerate beta blockers)   Family history of breast cancer 01/20/2021   Family history of prostate cancer 01/20/2021   Frequent headaches    HSV infection    fever blisters   Hypertension    Hypothyroidism     Past Surgical History:  Procedure Laterality Date   APPENDECTOMY  2013   BREAST LUMPECTOMY WITH RADIOACTIVE SEED LOCALIZATION Left 12/18/2020   Procedure: LEFT BREAST LUMPECTOMY WITH RADIOACTIVE SEED LOCALIZATION;  Surgeon: Erroll Luna, MD;  Location: [facility];  Service: General;  Laterality: Left;   KNEE ARTHROSCOPY Right    lapband  2013   SENTINEL NODE BIOPSY Left 01/07/2021   Procedure: LEFT SENTINEL NODE BIOPSY;  Surgeon: Erroll Luna, MD;  Location: [facility];  Service: General;  Laterality: Left;   TONSILLECTOMY  2012    Current Meds  Medication Sig   ALPRAZolam (XANAX) 0.25 MG tablet Take 1 tablet (0.25 mg total) by mouth 2 (two) times daily as needed.   butalbital-acetaminophen-caffeine (FIORICET) 50-325-40 MG tablet TAKE 1 TABLET BY MOUTH EVERY 6 (SIX) HOURS AS NEEDED FOR HEADACHE.   cyclobenzaprine (FLEXERIL) 10 MG tablet Take 1 tablet (10 mg total) by mouth daily as needed (migraines).   flecainide (TAMBOCOR) 50 MG tablet TAKE 1 TABLET BY MOUTH TWICE A DAY (Patient taking differently: Take  50 mg by mouth 2 (two) times daily.)   gabapentin (NEURONTIN) 100 MG capsule Take 1 capsule (100 mg total) by mouth 3 (three) times daily as needed. Use in addition to 300mg  dose   gabapentin (NEURONTIN) 300 MG capsule Take 1 capsule (300 mg total) by mouth 2 (two) times daily. (Patient taking differently: Take 300 mg by mouth at bedtime.)   levocetirizine (XYZAL) 5 MG tablet TAKE 1 TABLET BY MOUTH EVERY DAY IN THE EVENING   levothyroxine (SYNTHROID) 112 MCG tablet Take 1 tablet (112 mcg total) by mouth daily before breakfast.   LORazepam (ATIVAN) 0.5 MG tablet Take 0.5 mg by mouth every 8 (eight) hours as needed.   losartan (COZAAR) 25 MG tablet  TAKE 1 TABLET BY MOUTH EVERY DAY (Patient taking differently: Take 25 mg by mouth at bedtime.)   potassium chloride (KLOR-CON M) 10 MEQ tablet Take 1 tablet (10 mEq total) by mouth 2 (two) times daily.   rizatriptan (MAXALT-MLT) 10 MG disintegrating tablet Take 1 tablet (10 mg total) by mouth as needed for migraine. May repeat in 2 hours if needed   SUMAtriptan (IMITREX) 25 MG tablet TAKE 1 TABLET (25 MG TOTAL) BY MOUTH EVERY 2 (TWO) HOURS AS NEEDED FOR MIGRAINE. MAY REPEAT IN 2 HOURS IF HEADACHE PERSISTS OR RECURS.   tamoxifen (NOLVADEX) 20 MG tablet TAKE 1 TABLET BY MOUTH EVERY DAY   traMADol (ULTRAM) 50 MG tablet Take 1 tablet (50 mg total) by mouth every 12 (twelve) hours as needed.   valACYclovir (VALTREX) 500 MG tablet TAKE 1 TABLET (500 MG TOTAL) BY MOUTH DAILY AS NEEDED (HERPES SIMPLEX).   venlafaxine XR (EFFEXOR-XR) 75 MG 24 hr capsule TAKE 1 CAPSULE BY MOUTH DAILY WITH BREAKFAST.    Allergies  Allergen Reactions   Ciprofloxacin     anxiety   Keflex [Cephalexin] Hives   Trazodone And Nefazodone Other (See Comments)    nightmares      Review of Systems negative except from HPI and PMH  Physical Exam BP (!) 140/98 (BP Location: Left Arm, Patient Position: Sitting, Cuff Size: Normal)    Pulse 77    Ht 5\' 6"  (1.676 m)    Wt 207 lb (93.9 kg)    SpO2 97%    BMI 33.41 kg/m  Well developed and nourished in no acute distress HENT normal Neck supple with JVP-  flat   Clear Regular rate and rhythm, no murmurs or gallops Abd-soft with active BS No Clubbing cyanosis edema Skin-warm and dry A & Oriented  Grossly normal sensory and motor function  ECG sinus at 77 Intervals 14/09/38 Poor R wave progression  Assessment and  Plan  PACs-frequent-symptomatic  VT-nonsustained  Blood pressure-elevated  Hypokalemia  Psychosocial stress    Blood pressure is elevated.  There is been a significant amount of psychosocial stress.  Blood pressures at home before she went down to help  take care of her mom are in the 120 range; hence, we will wait prior to increasing her medications.  Continue her losartan at 25.  She was put on Klor-Con and she will need follow-up with her PCP  Symptomatic ectopy suppressed by the flecainide, continued at 50 milligrams twice daily  Ectopy much improved on flecainide-- continue.  ECG without conduction prolongation  Encouraged her she aerobic efforts in caring for her mom

## 2021-10-31 ENCOUNTER — Ambulatory Visit: Payer: BC Managed Care – PPO | Admitting: Occupational Therapy

## 2021-10-31 DIAGNOSIS — L599 Disorder of the skin and subcutaneous tissue related to radiation, unspecified: Secondary | ICD-10-CM

## 2021-10-31 DIAGNOSIS — N644 Mastodynia: Secondary | ICD-10-CM

## 2021-10-31 DIAGNOSIS — Z483 Aftercare following surgery for neoplasm: Secondary | ICD-10-CM

## 2021-10-31 DIAGNOSIS — M25612 Stiffness of left shoulder, not elsewhere classified: Secondary | ICD-10-CM | POA: Diagnosis not present

## 2021-10-31 DIAGNOSIS — M6281 Muscle weakness (generalized): Secondary | ICD-10-CM

## 2021-10-31 DIAGNOSIS — L905 Scar conditions and fibrosis of skin: Secondary | ICD-10-CM

## 2021-10-31 NOTE — Therapy (Signed)
[facility] [address] [place], [address] Phone: [phone]   Fax:  [phone]  Occupational Therapy Treatment  Patient Details  Name: [name] MRN: [id number] Date of Birth: Jan 01, 1971 Referring Provider (OT): Dara Lords   Encounter Date: 10/31/2021   OT End of Session - 10/31/21 1106     Visit Number 11    Number of Visits 16    Date for OT Re-Evaluation 12/02/21    OT Start Time 0916    OT Stop Time 1001    OT Time Calculation (min) 45 min    Activity Tolerance Patient tolerated treatment well    Behavior During Therapy [facility] for tasks assessed/performed             Past Medical History:  Diagnosis Date   Allergy    Anemia    present for many years, mild, stable   Anxiety    Breast cancer ([place])    Breast cancer ([place]) 12/18/2020   Dysrhythmia    PAC's on flecanide (did not tolerate beta blockers)   Family history of breast cancer 01/20/2021   Family history of prostate cancer 01/20/2021   Frequent headaches    HSV infection    fever blisters   Hypertension    Hypothyroidism     Past Surgical History:  Procedure Laterality Date   APPENDECTOMY  2013   BREAST LUMPECTOMY WITH RADIOACTIVE SEED LOCALIZATION Left 12/18/2020   Procedure: LEFT BREAST LUMPECTOMY WITH RADIOACTIVE SEED LOCALIZATION;  Surgeon: Erroll Luna, MD;  Location: [facility];  Service: General;  Laterality: Left;   KNEE ARTHROSCOPY Right    lapband  2013   SENTINEL NODE BIOPSY Left 01/07/2021   Procedure: LEFT SENTINEL NODE BIOPSY;  Surgeon: Erroll Luna, MD;  Location: [facility];  Service: General;  Laterality: Left;   TONSILLECTOMY  2012    There were no vitals filed for this visit.   Subjective Assessment - 10/31/21 1104     Subjective  I felt good about 1/2 day after last time and then pain was more on the upper breast into the nipple and anterior shoulder -some under my arm - but I have so  much stress - aunt and uncle sick , seen counselor one time- seen cardiologist and my BP so high- so I am just so  tight    Pertinent History An MRI in September showed a seroma that they are just watching, and don't think it needs to be drained.       Pertinent History left beast cancer with lumpectomy and a second surgery with 2 nodes removed, Pt had 28 treatments of radiation, Pt said the she had some type of reaction after surgery and her left breast swelled severly.  The breast and L arm pai. It occurs when I use my arm more.    Patient Stated Goals I want the pain better so I can use my arm like befort    Currently in Pain? Yes    Pain Score 6     Pain Location Chest    Pain Orientation Left    Pain Descriptors / Indicators Tightness;Shooting;Tender    Pain Type Surgical pain    Pain Onset More than a month ago    Pain Frequency Constant                  Pt was seen 3 sessions in Nov, 4 in Dec and 3 in Jan/Febr Pt do not  show any lymphedema symptoms and cording cleared  Pt progress was limited by several trips that she had to make to [facility] - mom had  breast Cancer  Then was in Hospice and past away in January  Working as Pharmacist, hospital , stress and now iron deficient and starting infusion  Had UTI last week Pt cont to have pain in L breast  at rest and limiting her shoulder AROM - last week focus on lateral breast 9/10  And lateral breast into axilla and pect Pain limiting her motion in ABD and Flexion  This date pt arrive with pain superior breast into nipple and anterior chest pect major/axilla          Used this date stone tool for fascia release - in axilla, under and over pect -into anterior and upper shoulder /arm  Lateral and superior breast  Very tight , trigger points starting out  Improved greatly  And full AROM in shoulder flexion, ABD and ext rotation WNL   Pain decrease to  less 2/10 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 , 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  , sitting and sleeping  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- support it out to 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 - not carrying infront of her  Scapula retraction and shoulder ext 1-2 lbs weight or band     No cording in arm observe now for few wks                 OT Education - 10/31/21 1106     Education Details progress and HEP changes    Person(s) Educated Patient    Methods Explanation;Demonstration;Tactile cues;Verbal cues    Comprehension Returned demonstration;Verbalized understanding;Verbal cues required                 OT Long Term Goals - 10/07/21 1736       OT LONG TERM GOAL #1   Title Pt will report the pain in her left upper quadrant is reduced to 2/10 at the most    Baseline pain in L breast, axilla and into arm 6/10 - limiing her AROM and use of L arm- NOW  pain cont to be 4-8/10 in breast , pect and upper arm    Time 6    Period Weeks    Status On-going    Target Date 11/18/21      OT LONG TERM GOAL #2   Title Pt will be independent in a home exericse program  to decrease scar tissue , pain and increase  left shoudler AROM and strength    Baseline pain still 4-8/10 -and pt do have knowledge but hard time doing at home - work schedule , mom was sick and had to travel several times to [facility]  was then on hospice and past away 2 wks ago - was not in therapy for about 6 wks    Time 8    Period Weeks    Status On-going    Target Date 12/02/21      OT LONG TERM GOAL #3   Title Pt will report  knowledgable about lymphedem risk reduction practices    Status Achieved                   Plan - 10/31/21 1106     Clinical Impression Statement Pt had L lumpectomy in April 22- and radiation - was seen in [place] by PT for L UE pain, stiffness and cording until  about Oct 22 and then transfer to OT closer to home and had been focussing since Nov 22 on pain in L breast, axilla, pect and arm to  volar elbow. Pt was not seen for 6 wks  in Dec /Jan  when she was out of town taking care of her mom that was getting Hospice and past away middle January.  Seen surgeon about 2 wks ago  until next appt with him to try consistant therapy - last week focus on lateral breast from 2 to 5 o'clock . Scar at nipple to scar in axilla - with AAROM sidelying several times to assess. Pain decrease from 9/10 to 2/10 -  but this date increase pain superior breast into nipple and pect major - this date done some soft tissue using stone tool perfect fit in axilla , pect major and breast - great progress to pain less than 2/10 and L shoulder AROM WNL for flexion and ABD , ext rotation. Pt to do massage and chip bag not carry object infront of chest - focus on shoulder ext and scapul squeezes. .  Pt has hard time maintaining progress from one session to next. No lymphedema in L UE - measurements WNL.  She continues to complain of  pect, breast pain more than upper arm pain.   She will benefit from continued OT until appt with surgeon to address pain and limitations in ROM and  strenght to in L dominant hand limiting her use  ADl's and IADl's    OT Occupational Profile and History Problem Focused Assessment - Including review of records relating to presenting problem    Occupational performance deficits (Please refer to evaluation for details): ADL's;IADL's;Rest and Sleep;Work;Play;Leisure;Social Participation    Body Structure / Function / Physical Skills ADL;Decreased knowledge of precautions;Skin integrity;Flexibility;Scar mobility;IADL;ROM;Edema;UE functional use;Pain    Rehab Potential Fair    Clinical Decision Making Limited treatment options, no task modification necessary    Comorbidities Affecting Occupational Performance: None    Modification or Assistance to Complete Evaluation  No modification of tasks or assist necessary to complete eval    OT Frequency --   1-2 x wk   OT Duration 6 weeks    OT Treatment/Interventions Self-care/ADL training;Manual lymph  drainage;Therapeutic activities;Contrast Bath;Therapeutic exercise;Scar mobilization;Passive range of motion;Manual Therapy;Patient/family education    Consulted and Agree with Plan of Care Patient             Patient will benefit from skilled therapeutic intervention in order to improve the following deficits and impairments:   Body Structure / Function / Physical Skills: ADL, Decreased knowledge of precautions, Skin integrity, Flexibility, Scar mobility, IADL, ROM, Edema, UE functional use, Pain       Visit Diagnosis: Stiffness of left shoulder, not elsewhere classified  Scar condition and fibrosis of skin  Aftercare following surgery for neoplasm  Pain of left breast  Disorder of the skin and subcutaneous tissue related to radiation, unspecified  Muscle weakness (generalized)    Problem List Patient Active Problem List   Diagnosis Date Noted   Prediabetes 03/27/2021   Class 2 severe obesity due to excess calories with serious comorbidity and body mass index (BMI) of 36.0 to 36.9 in adult ([place]) 03/27/2021   B12 deficiency anemia 01/19/2021   PVC (premature ventricular contraction) 01/17/2021   Iron deficiency anemia 01/17/2021   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, [place], [address] Phone: [phone]   Fax:  [phone]  Name: [name] MRN: [id number] Date of Birth: 01/30/1971

## 2021-11-04 ENCOUNTER — Other Ambulatory Visit: Payer: Self-pay

## 2021-11-04 ENCOUNTER — Ambulatory Visit: Payer: BC Managed Care – PPO | Admitting: Occupational Therapy

## 2021-11-04 DIAGNOSIS — M25612 Stiffness of left shoulder, not elsewhere classified: Secondary | ICD-10-CM | POA: Diagnosis not present

## 2021-11-04 DIAGNOSIS — L905 Scar conditions and fibrosis of skin: Secondary | ICD-10-CM

## 2021-11-04 DIAGNOSIS — Z483 Aftercare following surgery for neoplasm: Secondary | ICD-10-CM

## 2021-11-04 DIAGNOSIS — N644 Mastodynia: Secondary | ICD-10-CM

## 2021-11-04 NOTE — Therapy (Signed)
[facility] [address] [place], [address] Phone: [phone]   Fax:  [phone]  Occupational Therapy Treatment  Patient Details  Name: [name] MRN: [id number] Date of Birth: 01-19-1971 Referring Provider (OT): Dara Lords   Encounter Date: 11/04/2021   OT End of Session - 11/04/21 1709     Visit Number 12    Number of Visits 16    Date for OT Re-Evaluation 12/02/21    OT Start Time 0488    OT Stop Time 1700    OT Time Calculation (min) 45 min    Activity Tolerance Patient tolerated treatment well    Behavior During Therapy [facility] for tasks assessed/performed             Past Medical History:  Diagnosis Date   Allergy    Anemia    present for many years, mild, stable   Anxiety    Breast cancer ([place])    Breast cancer ([place]) 12/18/2020   Dysrhythmia    PAC's on flecanide (did not tolerate beta blockers)   Family history of breast cancer 01/20/2021   Family history of prostate cancer 01/20/2021   Frequent headaches    HSV infection    fever blisters   Hypertension    Hypothyroidism     Past Surgical History:  Procedure Laterality Date   APPENDECTOMY  2013   BREAST LUMPECTOMY WITH RADIOACTIVE SEED LOCALIZATION Left 12/18/2020   Procedure: LEFT BREAST LUMPECTOMY WITH RADIOACTIVE SEED LOCALIZATION;  Surgeon: Erroll Luna, MD;  Location: [place];  Service: General;  Laterality: Left;   KNEE ARTHROSCOPY Right    lapband  2013   SENTINEL NODE BIOPSY Left 01/07/2021   Procedure: LEFT SENTINEL NODE BIOPSY;  Surgeon: Erroll Luna, MD;  Location: [place];  Service: General;  Laterality: Left;   TONSILLECTOMY  2012    There were no vitals filed for this visit.   Subjective Assessment - 11/04/21 1708     Subjective  The pain starting about 1-2 days lateral in the top of breast over my pect into nipple -under arm and side of breast better    Pertinent History An MRI in  September showed a seroma that they are just watching, and don't think it needs to be drained.       Pertinent History left beast cancer with lumpectomy and a second surgery with 2 nodes removed, Pt had 28 treatments of radiation, Pt said the she had some type of reaction after surgery and her left breast swelled severly.  The breast and L arm pai. It occurs when I use my arm more.    Patient Stated Goals I want the pain better so I can use my arm like [place]    Currently in Pain? Yes    Pain Score 4     Pain Location Chest    Pain Orientation Left    Pain Descriptors / Indicators Tightness;Sharp;Shooting    Pain Type Surgical pain    Pain Onset More than a month ago                   Used this date stone tool for fascia release -  over pect above chest and into anterior  and lateral upper arm - trigger points present anterior axilla , upper arm -  Some done on lateral  breast at scar tissue and superior breast  - but retained progress since last week  Did had deep  scar tissue at lumpectomy scar lateral to nipple  Done mini massager and around nipple and radiation blister scar  Improved greatly  again And full AROM in shoulder flexion, ABD and ext rotation WNL with pull less than 1/10 above nipple   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 , 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 , sitting and sleeping  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- support it out to 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 - not carrying infront of her  Scapula retraction and shoulder ext 1-2 lbs weight or band  Pt to cont with chipbag and komprex foam to simulate jovipak     No cording in arm observe now for few wks                       OT Education - 11/04/21 1709     Education Details progress and HEP changes    Person(s) Educated Patient    Methods Explanation;Demonstration;Tactile cues;Verbal cues    Comprehension Returned demonstration;Verbalized understanding;Verbal cues required                  OT Long Term Goals - 10/07/21 1736       OT LONG TERM GOAL #1   Title Pt will report the pain in her left upper quadrant is reduced to 2/10 at the most    Baseline pain in L breast, axilla and into arm 6/10 - limiing her AROM and use of L arm- NOW  pain cont to be 4-8/10 in breast , pect and upper arm    Time 6    Period Weeks    Status On-going    Target Date 11/18/21      OT LONG TERM GOAL #2   Title Pt will be independent in a home exericse program  to decrease scar tissue , pain and increase  left shoudler AROM and strength    Baseline pain still 4-8/10 -and pt do have knowledge but hard time doing at home - work schedule , mom was sick and had to travel several times to [facility],  was then on hospice and past away 2 wks ago - was not in therapy for about 6 wks    Time 8    Period Weeks    Status On-going    Target Date 12/02/21      OT LONG TERM GOAL #3   Title Pt will report  knowledgable about lymphedem risk reduction practices    Status Achieved                   Plan - 11/04/21 1710     OT Occupational Profile and History Problem Focused Assessment - Including review of records relating to presenting problem    Occupational performance deficits (Please refer to evaluation for details): ADL's;IADL's;Rest and Sleep;Work;Play;Leisure;Social Participation    Body Structure / Function / Physical Skills ADL;Decreased knowledge of precautions;Skin integrity;Flexibility;Scar mobility;IADL;ROM;Edema;UE functional use;Pain    Rehab Potential Fair    Clinical Decision Making Limited treatment options, no task modification necessary    Comorbidities Affecting Occupational Performance: None    Modification or Assistance to Complete Evaluation  No modification of tasks or assist necessary to complete eval    OT Frequency --   1-2 x wk   OT Duration 4 weeks    OT Treatment/Interventions Self-care/ADL training;Manual lymph drainage;Therapeutic activities;Contrast  Bath;Therapeutic exercise;Scar mobilization;Passive range of motion;Manual Therapy;Patient/family education    Consulted and Agree with Plan of  Care Patient             Patient will benefit from skilled therapeutic intervention in order to improve the following deficits and impairments:   Body Structure / Function / Physical Skills: ADL, Decreased knowledge of precautions, Skin integrity, Flexibility, Scar mobility, IADL, ROM, Edema, UE functional use, Pain       Visit Diagnosis: Stiffness of left shoulder, not elsewhere classified  Scar condition and fibrosis of skin  Aftercare following surgery for neoplasm  Pain of left breast    Problem List Patient Active Problem List   Diagnosis Date Noted   Prediabetes 03/27/2021   Class 2 severe obesity due to excess calories with serious comorbidity and body mass index (BMI) of 36.0 to 36.9 in adult ([place]) 03/27/2021   B12 deficiency anemia 01/19/2021   PVC (premature ventricular contraction) 01/17/2021   Iron deficiency anemia 01/17/2021   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, [place], [address] Phone: [phone]   Fax:  [phone]  Name: [name] MRN: [id number] Date of Birth: 01-06-1971

## 2021-11-06 ENCOUNTER — Ambulatory Visit: Payer: BC Managed Care – PPO | Attending: Nurse Practitioner | Admitting: Occupational Therapy

## 2021-11-06 ENCOUNTER — Other Ambulatory Visit: Payer: Self-pay

## 2021-11-06 ENCOUNTER — Inpatient Hospital Stay: Payer: BC Managed Care – PPO | Attending: Oncology

## 2021-11-06 ENCOUNTER — Telehealth: Payer: Self-pay | Admitting: *Deleted

## 2021-11-06 DIAGNOSIS — Z483 Aftercare following surgery for neoplasm: Secondary | ICD-10-CM | POA: Diagnosis present

## 2021-11-06 DIAGNOSIS — L905 Scar conditions and fibrosis of skin: Secondary | ICD-10-CM | POA: Diagnosis present

## 2021-11-06 DIAGNOSIS — M25612 Stiffness of left shoulder, not elsewhere classified: Secondary | ICD-10-CM | POA: Insufficient documentation

## 2021-11-06 DIAGNOSIS — N644 Mastodynia: Secondary | ICD-10-CM | POA: Diagnosis present

## 2021-11-06 NOTE — Telephone Encounter (Signed)
Patient called to stated that she had her B12 shot in [place]. ?

## 2021-11-06 NOTE — Therapy (Signed)
[facility] [address] [place], [address] Phone: [phone]   Fax:  [phone]  Occupational Therapy Treatment  Patient Details  Name: [name] MRN: [id number] Date of Birth: February 06, 1971 Referring Provider (OT): Dara Lords   Encounter Date: 11/06/2021   OT End of Session - 11/06/21 1729     Visit Number 13    Number of Visits 16    Date for OT Re-Evaluation 12/02/21    OT Start Time 1625    OT Stop Time 1705    OT Time Calculation (min) 40 min    Activity Tolerance Patient tolerated treatment well    Behavior During Therapy [facility] for tasks assessed/performed             Past Medical History:  Diagnosis Date   Allergy    Anemia    present for many years, mild, stable   Anxiety    Breast cancer ([place])    Breast cancer ([place]) 12/18/2020   Dysrhythmia    PAC's on flecanide (did not tolerate beta blockers)   Family history of breast cancer 01/20/2021   Family history of prostate cancer 01/20/2021   Frequent headaches    HSV infection    fever blisters   Hypertension    Hypothyroidism     Past Surgical History:  Procedure Laterality Date   APPENDECTOMY  2013   BREAST LUMPECTOMY WITH RADIOACTIVE SEED LOCALIZATION Left 12/18/2020   Procedure: LEFT BREAST LUMPECTOMY WITH RADIOACTIVE SEED LOCALIZATION;  Surgeon: Erroll Luna, MD;  Location: [facility];  Service: General;  Laterality: Left;   KNEE ARTHROSCOPY Right    lapband  2013   SENTINEL NODE BIOPSY Left 01/07/2021   Procedure: LEFT SENTINEL NODE BIOPSY;  Surgeon: Erroll Luna, MD;  Location: [place];  Service: General;  Laterality: Left;   TONSILLECTOMY  2012    There were no vitals filed for this visit.   Subjective Assessment - 11/06/21 1727     Subjective  My pain was about 3-4/10 during day but then increase few hours ago - but I don't have to hold my breast anymore as I use to - think one of the muscle relaxer  helps me sleep better    Pertinent History An MRI in September showed a seroma that they are just watching, and don't think it needs to be drained.       Pertinent History left beast cancer with lumpectomy and a second surgery with 2 nodes removed, Pt had 28 treatments of radiation, Pt said the she had some type of reaction after surgery and her left breast swelled severly.  The breast and L arm pai. It occurs when I use my arm more.    Patient Stated Goals I want the pain better so I can use my arm like [place]    Currently in Pain? Yes    Pain Score 5     Pain Location Chest    Pain Orientation Left    Pain Descriptors / Indicators Tightness;Shooting;Tender    Pain Type Surgical pain    Pain Onset More than a month ago    Pain Frequency Intermittent                       Used this date stone tool for fascia release  again -  over pect above chest and into anterior  and lateral upper arm - trigger points present anterior axilla , upper arm  but improving-  Some done on lateral  breast at scar tissue and superior breast  - but retained progress since last week  Was able to work on deeper scar tissue at lumpectomy scar lateral to nipple and superior breast and above nipple - with shoulder end range into flexion Done mini massager around nipple and radiation blister scar   Improved greatly  again And full AROM in shoulder flexion, ABD and ext rotation WNL with pull less than 1/10 above nipple   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 , 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 , sitting and sleeping  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- support it out to 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 - not carrying infront of her  Scapula retraction and shoulder ext 1-2 lbs weight or band  Pt to cont with chipbag and komprex foam to simulate jovipak     No cording in arm observe now for few wks                   OT Education - 11/06/21 1729     Education Details progress and HEP  changes    Person(s) Educated Patient    Methods Explanation;Demonstration;Tactile cues;Verbal cues    Comprehension Returned demonstration;Verbalized understanding;Verbal cues required                 OT Long Term Goals - 10/07/21 1736       OT LONG TERM GOAL #1   Title Pt will report the pain in her left upper quadrant is reduced to 2/10 at the most    Baseline pain in L breast, axilla and into arm 6/10 - limiing her AROM and use of L arm- NOW  pain cont to be 4-8/10 in breast , pect and upper arm    Time 6    Period Weeks    Status On-going    Target Date 11/18/21      OT LONG TERM GOAL #2   Title Pt will be independent in a home exericse program  to decrease scar tissue , pain and increase  left shoudler AROM and strength    Baseline pain still 4-8/10 -and pt do have knowledge but hard time doing at home - work schedule , mom was sick and had to travel several times to [facility],  was then on hospice and past away 2 wks ago - was not in therapy for about 6 wks    Time 8    Period Weeks    Status On-going    Target Date 12/02/21      OT LONG TERM GOAL #3   Title Pt will report  knowledgable about lymphedem risk reduction practices    Status Achieved                   Plan - 11/06/21 1729     Clinical Impression Statement Pt had L lumpectomy in April 22- and radiation - was seen in [place] by PT for L UE pain, stiffness and cording until about Oct 22 and then transfer to OT closer to home and had been focussing since Nov 22 on pain in L breast, axilla, pect and arm to  volar elbow. Pt was not seen for 6 wks  in Dec /Jan when she was out of town taking care of her mom that was getting Hospice and past away middle January.  Seen surgeon about 2-3 wks ago  until next appt with him to try consistant  therapy - last week focus on lateral breast from 2 to 5 o'clock . Scar at nipple to scar in axilla - with AAROM sidelying several times to assess. Pain decrease from 9/10 to  2/10 -  this week focus on pect , anterior shoulder and deeper lateral breast at scar and superior to nipple - pain in session with full AAROM shoulder flexion, ABD 1/10.Pt to do massage and chip bag - on nippple - focus on shoulder ext and scapula squeezes. .  Pt has hard time maintaining progress from one session to next but improving the last few sessions - can focus on more deeper soft tissue. No lymphedema in L UE - measurements WNL.  She continues to complain of  pect, breast pain more than upper arm pain.   She will benefit from continued OT until appt with surgeon to address pain and limitations in ROM and  strenght to in L dominant hand limiting her use  ADl's and IADl's    OT Occupational Profile and History Problem Focused Assessment - Including review of records relating to presenting problem    Occupational performance deficits (Please refer to evaluation for details): ADL's;IADL's;Rest and Sleep;Work;Play;Leisure;Social Participation    Body Structure / Function / Physical Skills ADL;Decreased knowledge of precautions;Skin integrity;Flexibility;Scar mobility;IADL;ROM;Edema;UE functional use;Pain    Rehab Potential Fair    Clinical Decision Making Limited treatment options, no task modification necessary    Comorbidities Affecting Occupational Performance: None    Modification or Assistance to Complete Evaluation  No modification of tasks or assist necessary to complete eval    OT Frequency --   1-2 x wk   OT Duration 4 weeks    OT Treatment/Interventions Self-care/ADL training;Manual lymph drainage;Therapeutic activities;Contrast Bath;Therapeutic exercise;Scar mobilization;Passive range of motion;Manual Therapy;Patient/family education    Consulted and Agree with Plan of Care Patient             Patient will benefit from skilled therapeutic intervention in order to improve the following deficits and impairments:   Body Structure / Function / Physical Skills: ADL, Decreased knowledge  of precautions, Skin integrity, Flexibility, Scar mobility, IADL, ROM, Edema, UE functional use, Pain       Visit Diagnosis: Stiffness of left shoulder, not elsewhere classified  Scar condition and fibrosis of skin  Aftercare following surgery for neoplasm  Pain of left breast    Problem List Patient Active Problem List   Diagnosis Date Noted   Prediabetes 03/27/2021   Class 2 severe obesity due to excess calories with serious comorbidity and body mass index (BMI) of 36.0 to 36.9 in adult ([place]) 03/27/2021   B12 deficiency anemia 01/19/2021   PVC (premature ventricular contraction) 01/17/2021   Iron deficiency anemia 01/17/2021   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 [place], [address] Phone: [phone]   Fax:  [phone]  Name: Catarina Huntley MRN: [id number] Date of Birth: 1970/09/21

## 2021-11-11 ENCOUNTER — Other Ambulatory Visit: Payer: Self-pay

## 2021-11-11 ENCOUNTER — Ambulatory Visit: Payer: BC Managed Care – PPO | Admitting: Occupational Therapy

## 2021-11-11 DIAGNOSIS — M25612 Stiffness of left shoulder, not elsewhere classified: Secondary | ICD-10-CM | POA: Diagnosis not present

## 2021-11-11 DIAGNOSIS — Z483 Aftercare following surgery for neoplasm: Secondary | ICD-10-CM

## 2021-11-11 DIAGNOSIS — N644 Mastodynia: Secondary | ICD-10-CM

## 2021-11-11 NOTE — Therapy (Signed)
[facility] [address], [place], [address] Phone: [phone]   Fax:  [phone]  Occupational Therapy Treatment  Patient Details  Name: [name] MRN: [id number] Date of Birth: 1971/03/05 Referring Provider (OT): Dara Lords   Encounter Date: 11/11/2021   OT End of Session - 11/11/21 1704     Visit Number 14    Number of Visits 16    Date for OT Re-Evaluation 12/02/21    OT Start Time 1616    OT Stop Time 1658    OT Time Calculation (min) 42 min    Activity Tolerance Patient tolerated treatment well    Behavior During Therapy [facility] for tasks assessed/performed             Past Medical History:  Diagnosis Date   Allergy    Anemia    present for many years, mild, stable   Anxiety    Breast cancer ([place])    Breast cancer ([place]) 12/18/2020   Dysrhythmia    PAC's on flecanide (did not tolerate beta blockers)   Family history of breast cancer 01/20/2021   Family history of prostate cancer 01/20/2021   Frequent headaches    HSV infection    fever blisters   Hypertension    Hypothyroidism     Past Surgical History:  Procedure Laterality Date   APPENDECTOMY  2013   BREAST LUMPECTOMY WITH RADIOACTIVE SEED LOCALIZATION Left 12/18/2020   Procedure: LEFT BREAST LUMPECTOMY WITH RADIOACTIVE SEED LOCALIZATION;  Surgeon: Erroll Luna, MD;  Location: [facility];  Service: General;  Laterality: Left;   KNEE ARTHROSCOPY Right    lapband  2013   SENTINEL NODE BIOPSY Left 01/07/2021   Procedure: LEFT SENTINEL NODE BIOPSY;  Surgeon: Erroll Luna, MD;  Location: [place];  Service: General;  Laterality: Left;   TONSILLECTOMY  2012    There were no vitals filed for this visit.   Subjective Assessment - 11/11/21 1703     Subjective  I was doing okay since last time about 3 days but Sunday and yesterday started hurting more-into the armpit and the top of the breast     Pertinent History An MRI in September showed a seroma that they are just watching, and don't think it needs to be drained.       Pertinent History left beast cancer with lumpectomy and a second surgery with 2 nodes removed, Pt had 28 treatments of radiation, Pt said the she had some type of reaction after surgery and her left breast swelled severly.  The breast and L arm pai. It occurs when I use my arm more.    Patient Stated Goals I want the pain better so I can use my arm like [place]    Currently in Pain? Yes    Pain Score 6     Pain Location Breast    Pain Orientation Left    Pain Descriptors / Indicators Shooting                  Last few sessions patient did not has scar tissue or fibrosis that had to be addressed from 5:00 to 10:00 o'oclock on left breast    Focus this date on soft tissue using  massage stone tool for fascia release  -  over pect above chest into the nipple, lateral breast into the axilla and then anterior and lateral upper arm   A lot of trigger points on  anterior and lateral upper arm - followed by mini massager - distal to proximal    Better able to retain progress from session to session  Was able to work on deeper scar tissue at lumpectomy scar lateral to nipple and superior breast and above nipple - with shoulder end range into flexion/abd and ext rotation Done mini massager around nipple too  Pain able to decrease in session to less than 1/10 with end range    Improved greatly  again And full AROM in shoulder flexion, ABD and ext rotation WNL with pull less than 1/10 above nipple   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 , 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 , sitting and sleeping  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- support it out to 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 - not carrying infront of her  Scapula retraction and shoulder ext 1-2 lbs weight or band  Pt to cont with chipbag and komprex foam to simulate jovipak Add and review composite ext  rotation stretch in door way hold 5 sec - 10 reps  And then several times during day - composite nerve glide with arms out to side  into abd      No cording in arm observe now for few wks                   OT Education - 11/11/21 1704     Education Details progress and HEP changes    Person(s) Educated Patient    Methods Explanation;Demonstration;Tactile cues;Verbal cues    Comprehension Returned demonstration;Verbalized understanding;Verbal cues required                 OT Long Term Goals - 10/07/21 1736       OT LONG TERM GOAL #1   Title Pt will report the pain in her left upper quadrant is reduced to 2/10 at the most    Baseline pain in L breast, axilla and into arm 6/10 - limiing her AROM and use of L arm- NOW  pain cont to be 4-8/10 in breast , pect and upper arm    Time 6    Period Weeks    Status On-going    Target Date 11/18/21      OT LONG TERM GOAL #2   Title Pt will be independent in a home exericse program  to decrease scar tissue , pain and increase  left shoudler AROM and strength    Baseline pain still 4-8/10 -and pt do have knowledge but hard time doing at home - work schedule , mom was sick and had to travel several times to [facility],  was then on hospice and past away 2 wks ago - was not in therapy for about 6 wks    Time 8    Period Weeks    Status On-going    Target Date 12/02/21      OT LONG TERM GOAL #3   Title Pt will report  knowledgable about lymphedem risk reduction practices    Status Achieved                   Plan - 11/11/21 1704     Clinical Impression Statement Pt had L lumpectomy in April 22- and radiation - was seen in [place] by PT for L UE pain, stiffness and cording until about Oct 22 and then transfer to OT closer to home and had been focussing since Nov 22 on pain in L breast, axilla,  pect and arm to  volar elbow. Pt was not seen for 6 wks  in Dec /Jan when she was out of town taking care of her mom that was  getting Hospice and past away middle January.  Seen surgeon about 3-4  wks ago  until next appt with him to try consistant therapy -focusing the last few weeks on mostly superior and lateral breast into the pec and anterior shoulder this date-pain used to be about 8 -9/10 and bottom half of breast but last few sessions decreasing to 6 out of 10 pain-able to get pain down to less than a 2 out of 10 pain .Also able to focus on deeper scar tissue at  lateral  and superior L breast .  At this date composite pec stretch with composite nerve glide to the side. No lymphedema in L UE - measurements WNL.  She continues to complain of  pect, breast pain more than upper arm pain.   She will benefit from continued OT until appt with surgeon to address pain and limitations in ROM and  strenght to in L dominant hand limiting her use  ADl's and IADl's    OT Occupational Profile and History Problem Focused Assessment - Including review of records relating to presenting problem    Occupational performance deficits (Please refer to evaluation for details): ADL's;IADL's;Rest and Sleep;Work;Play;Leisure;Social Participation    Body Structure / Function / Physical Skills ADL;Decreased knowledge of precautions;Skin integrity;Flexibility;Scar mobility;IADL;ROM;Edema;UE functional use;Pain    Rehab Potential Fair    Clinical Decision Making Limited treatment options, no task modification necessary    Comorbidities Affecting Occupational Performance: None    Modification or Assistance to Complete Evaluation  No modification of tasks or assist necessary to complete eval    OT Frequency --   1-2 x wk   OT Duration 4 weeks    OT Treatment/Interventions Self-care/ADL training;Manual lymph drainage;Therapeutic activities;Contrast Bath;Therapeutic exercise;Scar mobilization;Passive range of motion;Manual Therapy;Patient/family education    Consulted and Agree with Plan of Care Patient             Patient will benefit from  skilled therapeutic intervention in order to improve the following deficits and impairments:   Body Structure / Function / Physical Skills: ADL, Decreased knowledge of precautions, Skin integrity, Flexibility, Scar mobility, IADL, ROM, Edema, UE functional use, Pain       Visit Diagnosis: Stiffness of left shoulder, not elsewhere classified  Aftercare following surgery for neoplasm  Pain of left breast    Problem List Patient Active Problem List   Diagnosis Date Noted   Prediabetes 03/27/2021   Class 2 severe obesity due to excess calories with serious comorbidity and body mass index (BMI) of 36.0 to 36.9 in adult ([place]) 03/27/2021   B12 deficiency anemia 01/19/2021   PVC (premature ventricular contraction) 01/17/2021   Iron deficiency anemia 01/17/2021   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 [place], [address] Phone: [phone]   Fax:  [phone]  Name: Kmari Brian MRN: [id number] Date of Birth: [birth year]

## 2021-11-13 ENCOUNTER — Ambulatory Visit: Payer: BC Managed Care – PPO | Admitting: Occupational Therapy

## 2021-11-13 ENCOUNTER — Other Ambulatory Visit: Payer: Self-pay

## 2021-11-13 DIAGNOSIS — N644 Mastodynia: Secondary | ICD-10-CM

## 2021-11-13 DIAGNOSIS — Z483 Aftercare following surgery for neoplasm: Secondary | ICD-10-CM

## 2021-11-13 DIAGNOSIS — M25612 Stiffness of left shoulder, not elsewhere classified: Secondary | ICD-10-CM

## 2021-11-13 DIAGNOSIS — L905 Scar conditions and fibrosis of skin: Secondary | ICD-10-CM

## 2021-11-13 NOTE — Therapy (Signed)
[facility] [address], [place], [address] Phone: [phone]   Fax:  [phone]  Occupational Therapy Treatment  Patient Details  Name: [name] MRN: [id number] Date of Birth: 18-May-1971 Referring Provider (OT): Dara Lords   Encounter Date: 11/13/2021   OT End of Session - 11/13/21 1717     Visit Number 15    Number of Visits 16    Date for OT Re-Evaluation 12/02/21    OT Start Time 1627    OT Stop Time 1710    OT Time Calculation (min) 43 min    Activity Tolerance Patient tolerated treatment well    Behavior During Therapy [facility] for tasks assessed/performed             Past Medical History:  Diagnosis Date   Allergy    Anemia    present for many years, mild, stable   Anxiety    Breast cancer ([place])    Breast cancer ([place]) 12/18/2020   Dysrhythmia    PAC's on flecanide (did not tolerate beta blockers)   Family history of breast cancer 01/20/2021   Family history of prostate cancer 01/20/2021   Frequent headaches    HSV infection    fever blisters   Hypertension    Hypothyroidism     Past Surgical History:  Procedure Laterality Date   APPENDECTOMY  2013   BREAST LUMPECTOMY WITH RADIOACTIVE SEED LOCALIZATION Left 12/18/2020   Procedure: LEFT BREAST LUMPECTOMY WITH RADIOACTIVE SEED LOCALIZATION;  Surgeon: Erroll Luna, MD;  Location: [facility];  Service: General;  Laterality: Left;   KNEE ARTHROSCOPY Right    lapband  2013   SENTINEL NODE BIOPSY Left 01/07/2021   Procedure: LEFT SENTINEL NODE BIOPSY;  Surgeon: Erroll Luna, MD;  Location: [facility];  Service: General;  Laterality: Left;   TONSILLECTOMY  2012    There were no vitals filed for this visit.   Subjective Assessment - 11/13/21 1716     Subjective  It felt better for about 24 to 36 hours-after he leaves here pain was like a 1 or 2 when I reached overhead-but today I started feeling some shooting pain into  the top of my breast    Pertinent History An MRI in September showed a seroma that they are just watching, and don't think it needs to be drained.       Pertinent History left beast cancer with lumpectomy and a second surgery with 2 nodes removed, Pt had 28 treatments of radiation, Pt said the she had some type of reaction after surgery and her left breast swelled severly.  The breast and L arm pai. It occurs when I use my arm more.    Patient Stated Goals I want the pain better so I can use my arm like befort    Currently in Pain? Yes    Pain Score 7     Pain Location Breast    Pain Orientation Left    Pain Descriptors / Indicators Tightness;Tender;Shooting    Pain Type Surgical pain    Pain Onset More than a month ago    Pain Frequency Intermittent                     Last few sessions patient did not has scar tissue or fibrosis that had to be addressed from 5:00 to 10:00 o'oclock on left breast - one area at 9 o'clock     Focus this date  again on soft tissue using  massage stone tool for fascia release  -  over pect above chest into the nipple, lateral breast into the axilla and then anterior and lateral upper arm   A lot of trigger points on anterior upper arm - followed by mini massager - distal to proximal    Better able to retain progress from session to session -patient able to maintain progress for 24 to 36 hours the past few sessions Was able to work on deeper scar tissue at lumpectomy scar lateral to nipple and superior breast and above nipple - with shoulder end range into flexion/abd and ext rotation Done mini massager around nipple too  Pain able to decrease in session to less than 1/10 with end range    Improved greatly  again And full AROM in shoulder flexion, ABD and ext rotation WNL with pull less than 1/10 above nipple   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 , 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 , sitting and sleeping  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- support it out to  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 - not carrying infront of her  Scapula retraction and shoulder ext 1-2 lbs weight or band  Pt to cont with chipbag and komprex foam to simulate jovipak Add  this week  composite ext rotation stretch in door way hold 5 sec - 10 reps  And then several times during day - composite nerve glide with arms out to side  into abd  Review again with pt - she could not do it as much because of work schedule     No cording in arm observe now for few wks                OT Education - 11/13/21 1717     Education Details progress and HEP changes    Person(s) Educated Patient    Methods Explanation;Demonstration;Tactile cues;Verbal cues    Comprehension Returned demonstration;Verbalized understanding;Verbal cues required                 OT Long Term Goals - 10/07/21 1736       OT LONG TERM GOAL #1   Title Pt will report the pain in her left upper quadrant is reduced to 2/10 at the most    Baseline pain in L breast, axilla and into arm 6/10 - limiing her AROM and use of L arm- NOW  pain cont to be 4-8/10 in breast , pect and upper arm    Time 6    Period Weeks    Status On-going    Target Date 11/18/21      OT LONG TERM GOAL #2   Title Pt will be independent in a home exericse program  to decrease scar tissue , pain and increase  left shoudler AROM and strength    Baseline pain still 4-8/10 -and pt do have knowledge but hard time doing at home - work schedule , mom was sick and had to travel several times to [facility],  was then on hospice and past away 2 wks ago - was not in therapy for about 6 wks    Time 8    Period Weeks    Status On-going    Target Date 12/02/21      OT LONG TERM GOAL #3   Title Pt will report  knowledgable about lymphedem risk reduction practices    Status Achieved  Plan - 11/13/21 1717     Clinical Impression Statement Pt had L lumpectomy in April 22- and radiation - was seen  in [place] by PT for L UE pain, stiffness and cording until about Oct 22 and then transfer to OT closer to home and had been focussing since Nov 22 on pain in L breast, axilla, pect and arm to  volar elbow. Pt was not seen for 6 wks  in Dec /Jan when she was out of town taking care of her mom that was getting Hospice and past away middle January.  Seen surgeon about 3-4  wks ago  until next appt with him to try consistant therapy -focusing the last few weeks on mostly superior and lateral breast into the pec and anterior shoulder this week-pain used to be about 8 -9/10 and bottom half of breast but last few sessions decreasing to 6-7 out of 10 pain-able to get pain down to less than a 2 /10 pain in session with shoulder flexion/ABD and ext rotation . Able to maintain progress for about 24-36 hrs, Also able to focus on deeper scar tissue at  lateral  and superior L breast .   This week  composite pec stretch with composite nerve glide to the side done and add to HEP. No lymphedema in L UE - measurements WNL.  She continues to complain of  pect, breast pain more than upper arm pain.   She will benefit from continued OT until appt with surgeon to address pain and limitations in ROM and  strenght to in L dominant hand limiting her use  ADl's and IADl's    OT Occupational Profile and History Problem Focused Assessment - Including review of records relating to presenting problem    Occupational performance deficits (Please refer to evaluation for details): ADL's;IADL's;Rest and Sleep;Work;Play;Leisure;Social Participation    Body Structure / Function / Physical Skills ADL;Decreased knowledge of precautions;Skin integrity;Flexibility;Scar mobility;IADL;ROM;Edema;UE functional use;Pain    Rehab Potential Fair    Clinical Decision Making Limited treatment options, no task modification necessary    Comorbidities Affecting Occupational Performance: None    Modification or Assistance to Complete Evaluation  No  modification of tasks or assist necessary to complete eval    OT Frequency --   1-2 x wk   OT Duration 4 weeks    OT Treatment/Interventions Self-care/ADL training;Manual lymph drainage;Therapeutic activities;Contrast Bath;Therapeutic exercise;Scar mobilization;Passive range of motion;Manual Therapy;Patient/family education    Consulted and Agree with Plan of Care Patient             Patient will benefit from skilled therapeutic intervention in order to improve the following deficits and impairments:   Body Structure / Function / Physical Skills: ADL, Decreased knowledge of precautions, Skin integrity, Flexibility, Scar mobility, IADL, ROM, Edema, UE functional use, Pain       Visit Diagnosis: Stiffness of left shoulder, not elsewhere classified  Aftercare following surgery for neoplasm  Pain of left breast  Scar condition and fibrosis of skin    Problem List Patient Active Problem List   Diagnosis Date Noted   Prediabetes 03/27/2021   Class 2 severe obesity due to excess calories with serious comorbidity and body mass index (BMI) of 36.0 to 36.9 in adult ([facility]) 03/27/2021   B12 deficiency anemia 01/19/2021   PVC (premature ventricular contraction) 01/17/2021   Iron deficiency anemia 01/17/2021   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, [place], [address] Phone: [phone]   Fax:  [phone]  Name: [name] MRN: [id number] Date of Birth: [birth year]

## 2021-11-18 ENCOUNTER — Ambulatory Visit: Payer: BC Managed Care – PPO | Admitting: Occupational Therapy

## 2021-11-18 ENCOUNTER — Other Ambulatory Visit: Payer: Self-pay

## 2021-11-18 DIAGNOSIS — N644 Mastodynia: Secondary | ICD-10-CM

## 2021-11-18 DIAGNOSIS — Z483 Aftercare following surgery for neoplasm: Secondary | ICD-10-CM

## 2021-11-18 DIAGNOSIS — M25612 Stiffness of left shoulder, not elsewhere classified: Secondary | ICD-10-CM | POA: Diagnosis not present

## 2021-11-18 DIAGNOSIS — L905 Scar conditions and fibrosis of skin: Secondary | ICD-10-CM

## 2021-11-18 NOTE — Therapy (Signed)
[facility] ?[address] ?[place], [place], [address] ?Phone: [phone]   Fax:  [phone] ? ?Occupational Therapy Treatment ? ?Patient Details  ?Name: [name] ?MRN: [id number] ?Date of Birth: 10-06-1970 ?Referring Provider (OT): Dara Lords ? ? ?Encounter Date: 11/18/2021 ? ? OT End of Session - 11/18/21 1657   ? ? Visit Number 16   ? Number of Visits 1619   ? Date for OT Re-Evaluation 12/02/21   ? OT Start Time 1620   ? OT Stop Time 1655   ? OT Time Calculation (min) 35 min   ? Activity Tolerance Patient tolerated treatment well   ? Behavior During Therapy [facility] for tasks assessed/performed   ? ?  ?  ? ?  ? ? ?Past Medical History:  ?Diagnosis Date  ? Allergy   ? Anemia   ? present for many years, mild, stable  ? Anxiety   ? Breast cancer ([facility])   ? Breast cancer ([place]) 12/18/2020  ? Dysrhythmia   ? PAC's on flecanide (did not tolerate beta blockers)  ? Family history of breast cancer 01/20/2021  ? Family history of prostate cancer 01/20/2021  ? Frequent headaches   ? HSV infection   ? fever blisters  ? Hypertension   ? Hypothyroidism   ? ? ?Past Surgical History:  ?Procedure Laterality Date  ? APPENDECTOMY  2013  ? BREAST LUMPECTOMY WITH RADIOACTIVE SEED LOCALIZATION Left 12/18/2020  ? Procedure: LEFT BREAST LUMPECTOMY WITH RADIOACTIVE SEED LOCALIZATION;  Surgeon: Erroll Luna, MD;  Location: [facility];  Service: General;  Laterality: Left;  ? KNEE ARTHROSCOPY Right   ? lapband  2013  ? SENTINEL NODE BIOPSY Left 01/07/2021  ? Procedure: LEFT SENTINEL NODE BIOPSY;  Surgeon: Erroll Luna, MD;  Location: [facility];  Service: General;  Laterality: Left;  ? TONSILLECTOMY  2012  ? ? ?There were no vitals filed for this visit. ? ? Subjective Assessment - 11/18/21 1656   ? ? Subjective  I felt good from Thursday to about Sat - but then started hurting again from above to into my nipple- pain goes up to about 5/10   ? Pertinent History An MRI  in September showed a seroma that they are just watching, and don't think it needs to be drained.       Pertinent History left beast cancer with lumpectomy and a second surgery with 2 nodes removed, Pt had 28 treatments of radiation, Pt said the she had some type of reaction after surgery and her left breast swelled severly.  The breast and L arm pai. It occurs when I use my arm more.   ? Patient Stated Goals I want the pain better so I can use my arm like befort   ? Currently in Pain? Yes   ? Pain Score 5    ? Pain Location Breast   ? Pain Orientation Left   ? Pain Descriptors / Indicators Tightness;Shooting;Tender   ? Pain Type Surgical pain   ? Pain Onset More than a month ago   ? Pain Frequency Intermittent   ? ?  ?  ? ?  ? ? ? ? ? ? ? ? ? ? ? ? Last few sessions patient did not has scar tissue or fibrosis that had to be addressed from 5:00 to 10:00 o'oclock on left breast - one area at 9 o'clock ?  ?  ?Focus this date again on soft tissue using  massage stone  tool for fascia release  -  over pect above chest into the nipple, lateral breast at scar and then anterior and lateral upper arm   ?A lot of trigger points on anterior upper arm - followed by mini massager - distal to proximal  ?  ?Better able to retain progress from session to session -patient able to maintain progress for 24 to 48 hours the past few sessions ?Was able to work on deeper scar tissue at lumpectomy scar lateral to nipple and superior breast and above nipple - with shoulder end range into flexion/abd and ext rotation ?Done mini massager around nipple too  ?Pain able to decrease in session to less than 1-2/10 with end range  ?  ?Improved greatly  again in session ?And full AROM in shoulder flexion, ABD and ext rotation WNL with pull less than 1/10 above nipple ?  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 And then several times during day - composite nerve glide with arms out to side  into abd  ?Review again with pt - she could not do it as much because of work schedule ?  ?  ?No cording in arm observe now for few wks  ?  ?  ? ? ? ? ? ? ? ? ? ? ? OT Education - 11/18/21 1657   ? ? Education Details progress and HEP changes   ? Person(s) Educated Patient   ? Methods Explanation;Demonstration;Tactile cues;Verbal cues   ? Comprehension Returned demonstration;Verbalized understanding;Verbal cues required   ? ?  ?  ? ?  ? ? ? ? ? ? OT Long Term Goals - 10/07/21 1736   ? ?  ? OT LONG TERM GOAL #1  ? Title Pt will report the pain in her left upper quadrant is reduced to 2/10 at the most   ? Baseline pain in L breast, axilla and into arm 6/10 - limiing her AROM and use of L arm- NOW  pain cont to be 4-8/10 in breast , pect and upper arm   ? Time 6   ? Period Weeks   ? Status On-going   ? Target Date 11/18/21   ?  ? OT LONG TERM GOAL #2  ? Title Pt will be independent in a home exericse program  to decrease scar tissue , pain and increase  left shoudler AROM and strength   ? Baseline pain still 4-8/10 -and pt do have knowledge but hard time doing at home - work schedule , mom was sick and had to travel several times to [facility],  was then on hospice and past away 2 wks ago - was not in therapy for about 6 wks   ? Time 8   ? Period Weeks   ? Status On-going   ? Target Date 12/02/21   ?  ? OT LONG TERM GOAL #3  ? Title Pt will report  knowledgable about lymphedem risk reduction practices   ? Status Achieved   ? ?  ?  ? ?  ? ? ? ? ? ? ? ?  Plan - 11/18/21 1658   ? ? Clinical Impression Statement Pt had L lumpectomy in April 22- and radiation - was seen in [place] by PT for L UE pain,  stiffness and cording until about Oct 22 and then transfer to OT closer to home and had been focussing since Nov 22 on pain in L breast, axilla, pect and arm to  volar elbow. Pt was not seen for 6 wks  in Dec /Jan when she was out of town taking care of her mom that was getting Hospice and past away middle January.  Seen surgeon about 3-4  wks ago  until next appt with him to try consistant therapy -focusing the last few weeks on mostly superior and lateral breast into the pec and anterior shoulder this week-pain used to be about 8 -9/10 and bottom half of breast but last few sessions decreasing to 5-6 out of 10 pain-able to get pain down to less than a 2 /10 pain in session with shoulder flexion/ABD and ext rotation . Able to maintain progress for about 24-38 hrs, Also able to focus on deeper scar tissue at  lateral  and superior L breast .   This week  composite pec stretch with composite nerve glide to the side done and add to HEP. No lymphedema in L UE - measurements WNL.  She continues to complain of  pect, breast pain more than upper arm pain.   She will benefit from continued OT until appt with surgeon to address pain and limitations in ROM and  strenght to in L dominant hand limiting her use  ADl's and IADl's   ? OT Occupational Profile and History Problem Focused Assessment - Including review of records relating to presenting problem   ? Occupational performance deficits (Please refer to evaluation for details): ADL's;IADL's;Rest and Sleep;Work;Play;Leisure;Social Participation   ? Body Structure / Function / Physical Skills ADL;Decreased knowledge of precautions;Skin integrity;Flexibility;Scar mobility;IADL;ROM;Edema;UE functional use;Pain   ? Rehab Potential Fair   ? Clinical Decision Making Limited treatment options, no task modification necessary   ? Comorbidities Affecting Occupational Performance: None   ? Modification or Assistance to Complete Evaluation  No modification of tasks or assist necessary  to complete eval   ? OT Frequency --   1-2 x wk  ? OT Duration 4 weeks   ? OT Treatment/Interventions Self-care/ADL training;Manual lymph drainage;Therapeutic activities;Contrast Bath;Therapeutic exercis

## 2021-11-19 ENCOUNTER — Encounter: Payer: Self-pay | Admitting: Hematology

## 2021-11-20 ENCOUNTER — Ambulatory Visit: Payer: BC Managed Care – PPO | Admitting: Occupational Therapy

## 2021-11-20 ENCOUNTER — Inpatient Hospital Stay: Payer: BC Managed Care – PPO

## 2021-11-20 ENCOUNTER — Other Ambulatory Visit: Payer: Self-pay

## 2021-11-20 DIAGNOSIS — M25612 Stiffness of left shoulder, not elsewhere classified: Secondary | ICD-10-CM | POA: Diagnosis not present

## 2021-11-20 NOTE — Therapy (Signed)
[place] ?[facility] ?[address] ?[place], [place], [address] ?Phone: [phone]   Fax:  [phone] ? ?Occupational Therapy Treatment ? ?Patient Details  ?Name: [name] ?MRN: [id number] ?Date of Birth: 02/04/1971 ?Referring Provider (OT): Dara Lords ? ? ?Encounter Date: 11/20/2021 ? ? OT End of Session - 11/20/21 2006   ? ? Visit Number 17   ? Number of Visits 19   ? Date for OT Re-Evaluation 12/02/21   ? OT Start Time [phone]   ? OT Stop Time 1700   ? OT Time Calculation (min) 42 min   ? Activity Tolerance Patient tolerated treatment well   ? Behavior During Therapy [facility] for tasks assessed/performed   ? ?  ?  ? ?  ? ? ?Past Medical History:  ?Diagnosis Date  ? Allergy   ? Anemia   ? present for many years, mild, stable  ? Anxiety   ? Breast cancer ([place])   ? Breast cancer ([place]) 12/18/2020  ? Dysrhythmia   ? PAC's on flecanide (did not tolerate beta blockers)  ? Family history of breast cancer 01/20/2021  ? Family history of prostate cancer 01/20/2021  ? Frequent headaches   ? HSV infection   ? fever blisters  ? Hypertension   ? Hypothyroidism   ? ? ?Past Surgical History:  ?Procedure Laterality Date  ? APPENDECTOMY  2013  ? BREAST LUMPECTOMY WITH RADIOACTIVE SEED LOCALIZATION Left 12/18/2020  ? Procedure: LEFT BREAST LUMPECTOMY WITH RADIOACTIVE SEED LOCALIZATION;  Surgeon: Erroll Luna, MD;  Location: [place];  Service: General;  Laterality: Left;  ? KNEE ARTHROSCOPY Right   ? lapband  2013  ? SENTINEL NODE BIOPSY Left 01/07/2021  ? Procedure: LEFT SENTINEL NODE BIOPSY;  Surgeon: Erroll Luna, MD;  Location: [place];  Service: General;  Laterality: Left;  ? TONSILLECTOMY  2012  ? ? ?There were no vitals filed for this visit. ? ? Subjective Assessment - 11/20/21 2005   ? ? Subjective  My pain has been really bad the last 2 days from top of breast into my nipple -and even some underneath   ? Pertinent History An MRI in September showed a  seroma that they are just watching, and don't think it needs to be drained.       Pertinent History left beast cancer with lumpectomy and a second surgery with 2 nodes removed, Pt had 28 treatments of radiation, Pt said the she had some type of reaction after surgery and her left breast swelled severly.  The breast and L arm pai. It occurs when I use my arm more.   ? Patient Stated Goals I want the pain better so I can use my arm like befort   ? Currently in Pain? Yes   ? Pain Score 8    ? Pain Location Breast   ? Pain Orientation Left   ? Pain Descriptors / Indicators Tightness;Tender   ? Pain Type Surgical pain   ? Pain Onset More than a month ago   ? Pain Frequency Intermittent   ? ?  ?  ? ?  ? ? ? ? ? ? ? ?Last few sessions patient did not had scar tissue or fibrosis that had to be addressed from 5:00 to 10:00 o'oclock on left breast - one area at 9 o'clock ?  ?  ?Focus this date again on soft tissue using  massage stone tool for fascia release  -  over pect above  chest into the nipple, lateral breast at scar and then anterior and lateral upper arm   ?A lot of trigger points on anterior upper arm - followed by mini massager - distal to proximal  ?  ?Better able to retain progress from session to session -patient able to maintain progress for 24 to 48 hours the past few sessions Except last one ?Was able to work on deeper scar tissue at lumpectomy scar lateral to nipple and superior breast and above nipple - with shoulder end range into flexion/abd and ext rotation ?Done mini massager around nipple too  ?Pain able to decrease in session to less than 1-2/10 with end range  ?  ?Improved greatly  again in session ?And full AROM in shoulder flexion, ABD and ext rotation WNL with pull less than 1/10 above nipple ?  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 And then several times during day - composite nerve glide with arms out to side  into abd  ?Review again with pt - she could not do it as much because of work schedule ?  ?  ?No cording in arm observe now for few wks  ?  ?  ?  ?  ? ?Pt show increase shoulder AROM the last few session before pain - but pain still rebound in 24-48 hrs  ?Unable to maintain progress  ? ? ? ? ? ? ? ? ? ? ? ? ? OT Education - 11/20/21 2006   ? ? Education Details progress and HEP changes   ? Person(s) Educated Patient   ? Methods Explanation;Demonstration;Tactile cues;Verbal cues   ? Comprehension Returned demonstration;Verbalized understanding;Verbal cues required   ? ?  ?  ? ?  ? ? ? ? ? ? OT Long Term Goals - 10/07/21 1736   ? ?  ? OT LONG TERM GOAL #1  ? Title Pt will report the pain in her left upper quadrant is reduced to 2/10 at the most   ? Baseline pain in L breast, axilla and into arm 6/10 - limiing her AROM and use of L arm- NOW  pain cont to be 4-8/10 in breast , pect and upper arm   ? Time 6   ? Period Weeks   ? Status On-going   ? Target Date 11/18/21   ?  ? OT LONG TERM GOAL #2  ? Title Pt will be independent in a home exericse program  to decrease scar tissue , pain and increase  left shoudler AROM and strength   ? Baseline pain still 4-8/10 -and pt do have knowledge but hard time doing at home - work schedule , mom was sick and had to travel several times to [facility],  was then on hospice and past away 2 wks ago - was not in therapy for about 6 wks   ? Time 8   ? Period Weeks   ? Status On-going   ? Target Date 12/02/21   ?  ? OT LONG TERM GOAL #3  ? Title Pt will report  knowledgable  about lymphedem risk reduction practices   ? Status Achieved   ? ?  ?  ? ?  ? ? ? ? ? ? ? ? Plan - 11/20/21 2007   ? ?  Clinical Impression Statement Pt had L lumpectomy in April 22- and radiation. surgeon recommend her seeing therapy consistant until next appt with him next week. focusing the last few weeks on mostly superior and lateral breast into the pec and anterior shoulder -pain used to be about 8 -9/10 and bottom half of breast but last few sessions decreasing to 5-6 out of 10 pain- But today coming in with pain again 8/10 . Denied that she done anything different. Able to get pain down to less than a 2 /10 pain in sessions with shoulder flexion/ABD and ext rotation . Able to maintain progress lately for about 24-48 hrs but not last session, Also able to focus on deeper scar tissue at  lateral  and superior L breast .  Recommend for pt to do composite pec stretch with composite nerve glide to the side daily. No lymphedema in L UE - measurements WNL.  She continues to complain of  pect, breast pain more than upper arm pain.   She will benefit from continued OT until appt with surgeon to address pain and limitations in ROM and  strenght to in L dominant hand limiting her use  ADl's and IADl's   ? OT Occupational Profile and History Problem Focused Assessment - Including review of records relating to presenting problem   ? Occupational performance deficits (Please refer to evaluation for details): ADL's;IADL's;Rest and Sleep;Work;Play;Leisure;Social Participation   ? Body Structure / Function / Physical Skills ADL;Decreased knowledge of precautions;Skin integrity;Flexibility;Scar mobility;IADL;ROM;Edema;UE functional use;Pain   ? Rehab Potential Fair   ? Clinical Decision Making Limited treatment options, no task modification necessary   ? Comorbidities Affecting Occupational Performance: None   ? Modification or Assistance to Complete Evaluation  No modification of tasks or assist necessary to complete eval   ? OT Frequency --   1-2 x wk  ? OT Duration 4 weeks   ? OT Treatment/Interventions Self-care/ADL training;Manual lymph  drainage;Therapeutic activities;Contrast Bath;Therapeutic exercise;Scar mobilization;Passive range of motion;Manual Therapy;Patient/family education   ? Consulted and Agree with Plan of Care Patient   ? ?  ?  ?

## 2021-11-27 ENCOUNTER — Other Ambulatory Visit: Payer: Self-pay

## 2021-11-27 ENCOUNTER — Ambulatory Visit: Payer: BC Managed Care – PPO | Admitting: Occupational Therapy

## 2021-11-27 DIAGNOSIS — L905 Scar conditions and fibrosis of skin: Secondary | ICD-10-CM

## 2021-11-27 DIAGNOSIS — N644 Mastodynia: Secondary | ICD-10-CM

## 2021-11-27 DIAGNOSIS — M25612 Stiffness of left shoulder, not elsewhere classified: Secondary | ICD-10-CM

## 2021-11-27 NOTE — Therapy (Signed)
[place] ?[place] [facility] ?[address] ?[place], [place], [address] ?Phone: [phone]   Fax:  [phone] ? ?Occupational Therapy Treatment ? ?Patient Details  ?Name: [name] ?MRN: [id number] ?Date of Birth: February 17, 1971 ?Referring Provider (OT): Dara Lords ? ? ?Encounter Date: 11/27/2021 ? ? OT End of Session - 11/27/21 1744   ? ? Visit Number 18   ? Number of Visits 19   ? Date for OT Re-Evaluation 12/02/21   ? OT Start Time [phone]   ? OT Stop Time 1645   ? OT Time Calculation (min) 27 min   ? Activity Tolerance Patient tolerated treatment well   ? Behavior During Therapy [facility] for tasks assessed/performed   ? ?  ?  ? ?  ? ? ?Past Medical History:  ?Diagnosis Date  ? Allergy   ? Anemia   ? present for many years, mild, stable  ? Anxiety   ? Breast cancer ([place])   ? Breast cancer ([place]) 12/18/2020  ? Dysrhythmia   ? PAC's on flecanide (did not tolerate beta blockers)  ? Family history of breast cancer 01/20/2021  ? Family history of prostate cancer 01/20/2021  ? Frequent headaches   ? HSV infection   ? fever blisters  ? Hypertension   ? Hypothyroidism   ? ? ?Past Surgical History:  ?Procedure Laterality Date  ? APPENDECTOMY  2013  ? BREAST LUMPECTOMY WITH RADIOACTIVE SEED LOCALIZATION Left 12/18/2020  ? Procedure: LEFT BREAST LUMPECTOMY WITH RADIOACTIVE SEED LOCALIZATION;  Surgeon: Erroll Luna, MD;  Location: [place];  Service: General;  Laterality: Left;  ? KNEE ARTHROSCOPY Right   ? lapband  2013  ? SENTINEL NODE BIOPSY Left 01/07/2021  ? Procedure: LEFT SENTINEL NODE BIOPSY;  Surgeon: Erroll Luna, MD;  Location: [place];  Service: General;  Laterality: Left;  ? TONSILLECTOMY  2012  ? ? ?There were no vitals filed for this visit. ? ? Subjective Assessment - 11/27/21 1743   ? ? Subjective  My breast this past week spiked 1 day to a 9/10 pain it stays in the same area over my pact and took my breast to the nipple.  It returns in less than 24  hours.  Did take it tramadol earlier somewhat pain is only a 4/10 today appointment with surgeon tomorrow.   ? Pertinent History An MRI in September showed a seroma that they are just watching, and don't think it needs to be drained.       Pertinent History left beast cancer with lumpectomy and a second surgery with 2 nodes removed, Pt had 28 treatments of radiation, Pt said the she had some type of reaction after surgery and her left breast swelled severly.  The breast and L arm pai. It occurs when I use my arm more.   ? Patient Stated Goals I want the pain better so I can use my arm like befort   ? Currently in Pain? Yes   ? Pain Score 4    ? Pain Location Breast   ? Pain Orientation Left   ? Pain Descriptors / Indicators Tightness;Tender   ? Pain Type Surgical pain   ? ?  ?  ? ?  ? ? ? ? ? ? LYMPHEDEMA/ONCOLOGY QUESTIONNAIRE - 11/27/21 0001   ? ?  ? Right Upper Extremity Lymphedema  ? 15 cm Proximal to Olecranon Process 38 cm   ? 10 cm Proximal to Olecranon Process 35 cm   ? Olecranon  Process 27.4 cm   ? 15 cm Proximal to Ulnar Styloid Process 25 cm   ?  ? Left Upper Extremity Lymphedema  ? 15 cm Proximal to Olecranon Process 38.5 cm   ? 10 cm Proximal to Olecranon Process 36 cm   ? Olecranon Process 27 cm   ? 15 cm Proximal to Ulnar Styloid Process 25 cm   ? ?  ?  ? ?  ? ? ? ? ? ?  ?Last few sessions patient did not had scar tissue or fibrosis that had to be addressed  3 to 9:00 o'oclock on left breast - one area at 9 o'clock ?  ?  ?Focus this date again on soft tissue using  massage stone tool for fascia release  -  over pect above chest into the nipple, l medial  breast  and then anterior and lateral upper arm   ?A lot of trigger points on anterior upper arm - followed by mini massager - distal to proximal  ?  ?Last 3 sessions pt again not maintaining  progress more than 24 hrs ?Was able to work on deeper scar tissue at lumpectomy scar lateral to nipple and superior breast and above nipple - with shoulder end  range into flexion/abd and ext rotation the last 3-4 wks ?Done mini massager around nipple too  ?Pain able to decrease in session to less than 1-2/10 with end range  ?  ?Improved   again in session BUT NOT CARRY OVER FROM SESSION TO SESSION  ?And full AROM in shoulder flexion, ABD and ext rotation WNL with pull less than 1/10 above nipple ?  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 And then several times during day - composite nerve glide with arms out to side  into abd  ?Review again with pt - she could not do it as much because of work schedule ?  ?  ?No cording in arm observe now for few wks  ?  ?  ?  ?  ?  ?  ?  ?  ?  ? ? ? ? ? ? ? ? ? ? ? ? OT Education - 11/27/21 1744   ? ? Education Details HEP changes   ? Person(s) Educated Patient   ? Methods Explanation;Demonstration;Tactile cues;Verbal cues   ? Comprehension Returned demonstration;Verbalized understanding;Verbal cues required   ? ?  ?  ? ?  ? ? ? ? ? ? OT Long Term Goals - 11/27/21 1745   ? ?  ? OT LONG TERM GOAL #1  ? Title Pt will report the pain in her left upper quadrant is reduced to 2/10 at the most   ? Baseline pain in L breast, axilla and into arm 6/10 - limiing her AROM and use of L arm- NOW  pain cont to be 4-8/10 in breast , pect and upper arm   ? Time 1   ? Period Weeks   ? Status On-going   ? Target Date 12/02/21   ?  ? OT LONG TERM GOAL #2  ?  Title Pt will be independent in a home exericse program  to decrease scar tissue , pain and increase  left shoudler AROM and strength   ? Baseline pain still 4-8/10 -and pt do have knowledge but hard time doing at home -  work schedule , stress and - carry over not more than 24 hrs - pain return same area pect to nipple - scar tissue and fibrosis feels deeper   ? Time 1   ? Period Weeks   ? Status On-going   ? Target Date 12/02/21   ?  ? OT LONG TERM GOAL #3  ? Title Pt will report  knowledgable about lymphedem risk reduction practices   ? Status Achieved   ? ?  ?  ? ?  ? ? ? ? ? ? ? ? Plan - 11/27/21 1746   ? ? Clinical Impression Statement Pt had L lumpectomy in April 22- and radiation. surgeon recommend her seeing therapy consistant until next appt with surgeon tomorrow.Focusing the last few weeks on mostly superior and /medial/lateral breast into the pec and anterior shoulder -pain continues to be 4-9/10 . Denied that she done anything different. Able to get pain down to less than a1- 2 /10 pain in sessions with shoulder flexion/ABD and ext rotation . Able to maintain progress  24-48 hrs but not last  3 session, Also able to focus on deeper scar tissue at  lateral and bottom breast - but every time her complain mostly in superior  breast into pect and anterior shoulder. No lymphedema in L UE - measurements WNL.  She continues to complain of  pect, breast pain more than upper arm pain.  Pt to see surgeon tomorrow- recommend further assessments medically - OT do not improve her symptoms long term - no carry over from day to day at this time   ? OT Occupational Profile and History Problem Focused Assessment - Including review of records relating to presenting problem   ? Occupational performance deficits (Please refer to evaluation for details): ADL's;IADL's;Rest and Sleep;Work;Play;Leisure;Social Participation   ? Body Structure / Function / Physical Skills ADL;Decreased knowledge of precautions;Skin integrity;Flexibility;Scar mobility;IADL;ROM;Edema;UE functional use;Pain   ? Rehab Potential Fair   ? Clinical Decision Making Limited treatment options, no task modification necessary   ? Comorbidities Affecting Occupational  Performance: None   ? Modification or Assistance to Complete Evaluation  No modification of tasks or assist necessary to complete eval   ? OT Frequency 1x / week   ? OT Duration --   1 wks  ? OT Treatment/Interve

## 2021-12-02 ENCOUNTER — Encounter: Payer: BC Managed Care – PPO | Admitting: Occupational Therapy

## 2021-12-04 ENCOUNTER — Ambulatory Visit: Payer: BC Managed Care – PPO | Admitting: Occupational Therapy

## 2021-12-19 ENCOUNTER — Other Ambulatory Visit: Payer: Self-pay | Admitting: *Deleted

## 2021-12-19 ENCOUNTER — Encounter: Payer: Self-pay | Admitting: Internal Medicine

## 2021-12-19 ENCOUNTER — Ambulatory Visit
Admission: RE | Admit: 2021-12-19 | Discharge: 2021-12-19 | Disposition: A | Discharge status: Home / Self Care | Payer: BC Managed Care – PPO | Source: Ambulatory Visit | Attending: Nurse Practitioner | Admitting: Nurse Practitioner

## 2021-12-19 ENCOUNTER — Inpatient Hospital Stay: Payer: BC Managed Care – PPO

## 2021-12-19 ENCOUNTER — Inpatient Hospital Stay: Payer: BC Managed Care – PPO | Attending: Hematology | Admitting: Hematology

## 2021-12-19 VITALS — BP 162/84 | HR 67 | Temp 97.9°F | Resp 18 | Ht 66.0 in | Wt 209.9 lb

## 2021-12-19 DIAGNOSIS — Z170 Estrogen receptor positive status [ER+]: Secondary | ICD-10-CM | POA: Diagnosis not present

## 2021-12-19 DIAGNOSIS — C50412 Malignant neoplasm of upper-outer quadrant of left female breast: Secondary | ICD-10-CM | POA: Insufficient documentation

## 2021-12-19 DIAGNOSIS — D509 Iron deficiency anemia, unspecified: Secondary | ICD-10-CM

## 2021-12-19 DIAGNOSIS — E538 Deficiency of other specified B group vitamins: Secondary | ICD-10-CM | POA: Diagnosis present

## 2021-12-19 DIAGNOSIS — D519 Vitamin B12 deficiency anemia, unspecified: Secondary | ICD-10-CM

## 2021-12-19 DIAGNOSIS — N6489 Other specified disorders of breast: Secondary | ICD-10-CM

## 2021-12-19 HISTORY — DX: Personal history of irradiation: Z92.3

## 2021-12-19 LAB — CBC WITH DIFFERENTIAL (CANCER CENTER ONLY)
Abs Immature Granulocytes: 0.01 10*3/uL (ref 0.00–0.07)
Basophils Absolute: 0.1 10*3/uL (ref 0.0–0.1)
Basophils Relative: 1 %
Eosinophils Absolute: 0 10*3/uL (ref 0.0–0.5)
Eosinophils Relative: 1 %
HCT: 37.8 % (ref 36.0–46.0)
Hemoglobin: 12.9 g/dL (ref 12.0–15.0)
Immature Granulocytes: 0 %
Lymphocytes Relative: 25 %
Lymphs Abs: 1.2 10*3/uL (ref 0.7–4.0)
MCH: 30.4 pg (ref 26.0–34.0)
MCHC: 34.1 g/dL (ref 30.0–36.0)
MCV: 88.9 fL (ref 80.0–100.0)
Monocytes Absolute: 0.4 10*3/uL (ref 0.1–1.0)
Monocytes Relative: 8 %
Neutro Abs: 3 10*3/uL (ref 1.7–7.7)
Neutrophils Relative %: 65 %
Platelet Count: 189 10*3/uL (ref 150–400)
RBC: 4.25 MIL/uL (ref 3.87–5.11)
RDW: 13.5 % (ref 11.5–15.5)
WBC Count: 4.6 10*3/uL (ref 4.0–10.5)
nRBC: 0 % (ref 0.0–0.2)

## 2021-12-19 LAB — CMP (CANCER CENTER ONLY)
ALT: 9 U/L (ref 0–44)
AST: 14 U/L — ABNORMAL LOW (ref 15–41)
Albumin: 4 g/dL (ref 3.5–5.0)
Alkaline Phosphatase: 121 U/L (ref 38–126)
Anion gap: 7 (ref 5–15)
BUN: 6 mg/dL (ref 6–20)
CO2: 29 mmol/L (ref 22–32)
Calcium: 9.1 mg/dL (ref 8.9–10.3)
Chloride: 100 mmol/L (ref 98–111)
Creatinine: 0.93 mg/dL (ref 0.44–1.00)
GFR, Estimated: >60 mL/min (ref >60)
Glucose, Bld: 101 mg/dL — ABNORMAL HIGH (ref 70–99)
Potassium: 3.2 mmol/L — ABNORMAL LOW (ref 3.5–5.1)
Sodium: 136 mmol/L (ref 135–145)
Total Bilirubin: 0.4 mg/dL (ref 0.3–1.2)
Total Protein: 6.9 g/dL (ref 6.5–8.1)

## 2021-12-19 LAB — VITAMIN B12: Vitamin B-12: 275 pg/mL (ref 180–914)

## 2021-12-19 LAB — FERRITIN: Ferritin: 87 ng/mL (ref 11–307)

## 2021-12-19 MED ORDER — POTASSIUM CHLORIDE CRYS ER 10 MEQ PO TBCR: 10.0000 meq | EXTENDED_RELEASE_TABLET | Freq: Two times a day (BID) | ORAL | 1 refills | Status: DC

## 2021-12-19 MED ORDER — CYANOCOBALAMIN 1000 MCG/ML IJ SOLN
1000.0000 ug | Freq: Once | INTRAMUSCULAR | Status: AC
  Administered 2021-12-19: 1000 ug via INTRAMUSCULAR
  Filled 2021-12-19: qty 1

## 2021-12-19 NOTE — Progress Notes (Signed)
?[place]   ?Telephone:(336) [phone] [phone]   ?Clinic Follow up Note  ? ?Patient Care Team: ?Jearld Fenton, NP as PCP - General (Internal Medicine) ?Minna Merritts, MD as PCP - Cardiology (Cardiology) ?Erroll Luna, MD as Consulting Physician (General Surgery) ?Rockwell Germany, RN as Oncology Nurse Navigator ?Mauro Kaufmann, RN as Oncology Nurse Navigator ?Truitt Merle, MD as Consulting Physician (Hematology) ?Alla Feeling, NP as Nurse Practitioner (Nurse Practitioner) ?Kyung Rudd, MD as Consulting Physician (Radiation Oncology) ? ?Date of Service:  12/19/2021 ? ?CHIEF COMPLAINT: f/u of left breast cancer ? ?CURRENT THERAPY:  ?To start Tamoxifen ? ?ASSESSMENT & PLAN:  ?[name] is a 51 y.o. pre?-menopausal female with  ? ?1. Invasive Lobular Carcinoma of upper-outer quadrant of left breast, Stage IA, (pT1c, pN0), ER+/PR+/HER2-, Grade 2, RS 16  ?-She was under short-term follow up after a benign left breast biopsy. Biopsy of the left breast mass on 10/31/20 was again benign but felt to be discordant. ?-left lumpectomy under Dr. Brantley Stage on 12/18/20 showed invasive lobular carcinoma, 1.1 cm, grade 2, negative resection margins. ER+ (weakly), PR+, Her2-. RS 16 with less than 1% benefit of chemotherapy.  ?-Her 01/07/21 Sentinel LN biopsy was negative.  ?-Her genetic testing from 01/16/21 was negative for pathogenetic mutations.  ?-S/p adjuvant radiation with Dr. Lisbeth Renshaw on 02/05/21 - 03/19/21.  ?-breast MRI 06/17/21 was benign. ?-she tells me today she has not started tamoxifen yet, due to her recent life events (she had to take care of her mother on hospice for metastatic breast cancer and passed away). I advised her to start this, she agrees. I will also check her hormone levels to see if she is postmenopausal, last in 01/2021 showed she was premenopausal. ?-she had annual mammogram earlier today, results are pending. ?-I will see her back in 4 months ?  ?2. Left Breast/Axilla  Lymphedema and pain  ?-followed by PT since surgery ?-she is now going to PT twice weekly due to increasing cording and scar related pain.  ?-she is on robaxin as needed  ?  ?3. Depression, Anxiety, Social support ?-she was previously on Zoloft with Xanax. She is now on Effexor due to possible interaction between zoloft and tamoxifen. ?-her mother passed from metastatic breast cancer earlier this year, which has of course been very hard on her. ?  ?4. Mild anemia, iron deficiency and B12 deficiency  ?-She has h/o Lap band surgery. She also has very irregular periods since she was a teenager. Her last period was around age 69, she thinks. She is still premenopausal on 01/16/21 labs. She has been anemic since she was a teen (mild and stable).  ?-I discussed her prior lap band surgery is the likely cause of her B12 deficiency. I started her on B12 injections from 01/29/21, with dose due today. ?-she required 3 doses of IV Venofer in 02/2021. Ferritin was 34 on 10/17/21, so IV Venofer was given on 10/25/21. ?  ?  ?PLAN:  ?-proceed to B12 injection today and continue monthly  ?-start tamoxifen, will change to AI if she is postmenopausal  ?-lab and survivorship in 3 months ?-lab and f/u in 6 months ? ? ?No problem-specific Assessment & Plan notes found for this encounter. ? ? ?SUMMARY OF ONCOLOGIC HISTORY: ?Oncology History Overview Note  ?Cancer Staging ?Carcinoma of upper-outer quadrant of left breast, estrogen receptor positive ([place]) ?Staging form: Breast, AJCC 8th Edition ?- Pathologic stage from 01/02/2021: No Stage Recommended (pT1c, cN0, cM0,  G2, ER+, PR+, HER2-) - Unsigned ? ?  ?Carcinoma of upper-outer quadrant of left breast, estrogen receptor positive ([place])  ?10/28/2020 Imaging  ? DIGITAL DIAGNOSTIC UNILATERAL LEFT MAMMOGRAM WITH TOMOSYNTHESIS AND ?CAD; ULTRASOUND LEFT BREAST LIMITED ? ?CLINICAL DATA: 51 year old female presenting for short-term ?follow-up of a left breast biopsy demonstrating fibrocystic  changes. ? ?IMPRESSION: ?Indeterminate left breast mass at 2 o'clock 7 cm from the nipple. ?Targeted ultrasound of the left axilla demonstrates normal lymph ?nodes. ?  ?10/31/2020 Pathology Results  ? Breast, left, needle core biopsy, 2 o'clock ?- FIBROADIPOSE TISSUE WITH MINIMAL BREAST PARENCHYMA DEMONSTRATING MILD FIBROCYSTIC CHANGE TO ?INCLUDE APOCRINE METAPLASIA. ? ?This was found to be discordant. ?  ?12/18/2020 Surgery  ? A. BREAST, LEFT, LUMPECTOMY:  ?- Invasive lobular carcinoma, 1.1 cm, grade 2.  See comment  ?- Resection margins are negative for carcinoma; inferior margin is  ?focally less than 1 mm from carcinoma and medial margin is approximately  ?1 mm from carcinoma  ?- Biopsy related changes  ?- Background fibrocystic change  ? ?PROGNOSTIC INDICATOR RESULTS:  ?- The tumor cells are NEGATIVE for Her2 (0).  ?- Estrogen Receptor:       POSITIVE, 30%, WEAK STAINING  ?- Progesterone Receptor:   POSITIVE, 80%, MODERATE STAINING  ?- Proliferation Marker Ki-67:   <5%  ?  ?12/18/2020 Oncotype testing  ? Oncotype  ?Recurrence Score 16  ?Distant recurrence risk at 9 years of 4% with AI or Tamoxifen alone.  ?Less than 1% benefit of chemotherapy.  ?  ?01/02/2021 Initial Diagnosis  ? Carcinoma of upper-outer quadrant of left breast, estrogen receptor positive ([place]) ?  ?01/02/2021 Cancer Staging  ? Staging form: Breast, AJCC 8th Edition ?- Pathologic stage from 01/02/2021: Stage IA (pT1c, pN0, cM0, G2, ER+, PR+, HER2-) - Signed by Alla Feeling, NP on 05/12/2021 ?Method of lymph node assessment: Clinical ?Multigene prognostic tests performed: None ?Histologic grading system: 3 grade system ?Residual tumor (R): R0 - None ? ?  ?01/07/2021 Surgery  ? LEFT SENTINEL NODE BIOPSY by Dr Brantley Stage ? ?FINAL MICROSCOPIC DIAGNOSIS:  ? ?A. LYMPH NODE, LEFT, SENTINEL, EXCISION:  ?- One lymph node negative for metastatic carcinoma (0/1).  ? ?B. LYMPH NODE, LEFT, SENTINEL, EXCISION:  ?- One lymph node negative for metastatic carcinoma (0/1).   ? ?COMMENT:  ?Immunohistochemistry for cytokeratin AE1/AE3 is performed on parts A and  ?B and no metastatic carcinoma is identified ?  ?02/05/2021 - 03/19/2021 Radiation Therapy  ? Adjuvant Radiation with Dr Lisbeth Renshaw  ?  ?05/13/2021 -  Anti-estrogen oral therapy  ? Tamoxifen 69m once daily starting in September 2022 ?  ?05/13/2021 Survivorship  ? SCP delivered by LCira Rue NP ?  ? ? ? ?INTERVAL HISTORY:  ?Julie EISENHARTis here for a follow up of breast cancer. She was last seen by me on 08/11/21. She presents to the clinic alone. ?She lost her mom earlier this year to recurrent breast cancer. She tells me her mom was a survivor for 13 years before the cancer came back to her spine and ultimately took her life. ?  ?All other systems were reviewed with the patient and are negative. ? ?MEDICAL HISTORY:  ?Past Medical History:  ?Diagnosis Date  ? Allergy   ? Anemia   ? present for many years, mild, stable  ? Anxiety   ? [place]   ? [place]  ? Dysrhythmia   ? PAC's on flecanide (did not tolerate beta blockers)  ? Family history  of breast cancer 01/20/2021  ? Family history of prostate cancer 01/20/2021  ? Frequent headaches   ? HSV infection   ? fever blisters  ? Hypertension   ? Hypothyroidism   ? Personal history of radiation therapy   ? ? ?SURGICAL HISTORY: ?Past Surgical History:  ?Procedure Laterality Date  ? APPENDECTOMY  2013  ? BREAST BIOPSY Left 10/2020  ? BREAST LUMPECTOMY Left 12/18/2020  ? BREAST LUMPECTOMY WITH RADIOACTIVE SEED LOCALIZATION Left 12/18/2020  ? Procedure: LEFT BREAST LUMPECTOMY WITH RADIOACTIVE SEED LOCALIZATION;  Surgeon: Erroll Luna, MD;  Location: [facility];  Service: General;  Laterality: Left;  ? KNEE ARTHROSCOPY Right   ? lapband  2013  ? SENTINEL NODE BIOPSY Left 01/07/2021  ? Procedure: LEFT SENTINEL NODE BIOPSY;  Surgeon: Erroll Luna, MD;  Location: [facility];  Service: General;  Laterality: Left;  ? TONSILLECTOMY  2012  ? ? ?I  have reviewed the social history and family history with the patient and they are unchanged from previous note. ? ?ALLERGIES:  is allergic to ciprofloxacin, keflex [cephalexin], and trazodone and nefazodone. ?

## 2021-12-19 NOTE — Patient Instructions (Signed)
Vitamin B12 Deficiency Vitamin B12 deficiency occurs when the body does not have enough of this important vitamin. The body needs this vitamin: To make red blood cells. To make DNA. This is the genetic material inside cells. To help the nerves work properly so they can carry messages from the brain to the body. Vitamin B12 deficiency can cause health problems, such as not having enough red blood cells in the blood (anemia). This can lead to nerve damage if untreated. What are the causes? This condition may be caused by: Not eating enough foods that contain vitamin B12. Not having enough stomach acid and digestive fluids to properly absorb vitamin B12 from the food that you eat. Having certain diseases that make it hard to absorb vitamin B12. These diseases include Crohn's disease, chronic pancreatitis, and cystic fibrosis. An autoimmune disorder in which the body does not make enough of a protein (intrinsic factor) within the stomach, resulting in not enough absorption of vitamin B12. Having a surgery in which part of the stomach or small intestine is removed. Taking certain medicines that make it hard for the body to absorb vitamin B12. These include: Heartburn medicines, such as antacids and proton pump inhibitors. Some medicines that are used to treat diabetes. What increases the risk? The following factors may make you more likely to develop a vitamin B12 deficiency: Being an older adult. Eating a vegetarian or vegan diet that does not include any foods that come from animals. Eating a poor diet while you are pregnant. Taking certain medicines. Having alcoholism. What are the signs or symptoms? In some cases, there are no symptoms of this condition. If the condition leads to anemia or nerve damage, various symptoms may occur, such as: Weakness. Tiredness (fatigue). Loss of appetite. Numbness or tingling in your hands and feet. Redness and burning of the tongue. Depression,  confusion, or memory problems. Trouble walking. If anemia is severe, symptoms can include: Shortness of breath. Dizziness. Rapid heart rate. How is this diagnosed? This condition may be diagnosed with a blood test to measure the level of vitamin B12 in your blood. You may also have other tests, including: A group of tests that measure certain characteristics of blood cells (complete blood count, CBC). A blood test to measure intrinsic factor. A procedure where a thin tube with a camera on the end is used to look into your stomach or intestines (endoscopy). Other tests may be needed to discover the cause of the deficiency. How is this treated? Treatment for this condition depends on the cause. This condition may be treated by: Changing your eating and drinking habits, such as: Eating more foods that contain vitamin B12. Drinking less alcohol or no alcohol. Getting vitamin B12 injections. Taking vitamin B12 supplements by mouth (orally). Your health care provider will tell you which dose is best for you. Follow these instructions at home: Eating and drinking  Include foods in your diet that come from animals and contain a lot of vitamin B12. These include: Meats and poultry. This includes beef, pork, chicken, turkey, and organ meats, such as liver. Seafood. This includes clams, rainbow trout, salmon, tuna, and haddock. Eggs. Dairy foods such as milk, yogurt, and cheese. Eat foods that have vitamin B12 added to them (are fortified), such as ready-to-eat breakfast cereals. Check the label on the package to see if a food is fortified. The items listed above may not be a complete list of foods and beverages you can eat and drink. Contact a dietitian for   more information. Alcohol use Do not drink alcohol if: Your health care provider tells you not to drink. You are pregnant, may be pregnant, or are planning to become pregnant. If you drink alcohol: Limit how much you have to: 0-1 drink a  day for women. 0-2 drinks a day for men. Know how much alcohol is in your drink. In the [place] one drink equals one 12 oz bottle of beer (355 mL), one 5 oz glass of wine (148 mL), or one 1 oz glass of hard liquor (44 mL). General instructions Get vitamin B12 injections if told to by your health care provider. Take supplements only as told by your health care provider. Follow the directions carefully. Keep all follow-up visits. This is important. Contact a health care provider if: Your symptoms come back. Your symptoms get worse or do not improve with treatment. Get help right away: You develop shortness of breath. You have a rapid heart rate. You have chest pain. You become dizzy or you faint. These symptoms may be an emergency. Get help right away. Call 911. Do not wait to see if the symptoms will go away. Do not drive yourself to the hospital. Summary Vitamin B12 deficiency occurs when the body does not have enough of this important vitamin. Common causes include not eating enough foods that contain vitamin B12, not being able to absorb vitamin B12 from the food that you eat, having a surgery in which part of the stomach or small intestine is removed, or taking certain medicines. Eat foods that have vitamin B12 in them. Treatment may include making a change in the way you eat and drink, getting vitamin B12 injections, or taking vitamin B12 supplements. This information is not intended to replace advice given to you by your health care provider. Make sure you discuss any questions you have with your health care provider. Document Revised: 04/18/2021 Document Reviewed: 04/18/2021 Elsevier Patient Education  2023 Elsevier Inc.  

## 2021-12-20 ENCOUNTER — Encounter: Payer: Self-pay | Admitting: Hematology

## 2021-12-20 LAB — ESTRADIOL: Estradiol: 191 pg/mL

## 2021-12-20 LAB — FOLLICLE STIMULATING HORMONE: FSH: 36.8 m[IU]/mL

## 2021-12-22 NOTE — Telephone Encounter (Signed)
[name] ?Lets start with having her increase her losartan from 25>> 50 plz and then let us know where we are in about 2 more weeks ? ?Thanks SK   ?

## 2021-12-22 NOTE — Telephone Encounter (Signed)
We will have to check a bmet about that same time but lets get her data first ? ?

## 2021-12-23 ENCOUNTER — Other Ambulatory Visit: Payer: Self-pay | Admitting: *Deleted

## 2021-12-23 MED ORDER — LOSARTAN POTASSIUM 25 MG PO TABS: 50.0000 mg | ORAL_TABLET | Freq: Every day | ORAL | 3 refills | Status: DC

## 2021-12-23 NOTE — Progress Notes (Signed)
Losartan increased to 25 mg- 2 tablets (50 mg) once daily.  ?

## 2021-12-26 ENCOUNTER — Ambulatory Visit
Admission: RE | Admit: 2021-12-26 | Discharge: 2021-12-26 | Disposition: A | Discharge status: Home / Self Care | Payer: BC Managed Care – PPO | Source: Ambulatory Visit | Attending: Surgery | Admitting: Surgery

## 2021-12-26 ENCOUNTER — Other Ambulatory Visit: Payer: Self-pay | Admitting: Surgery

## 2021-12-26 ENCOUNTER — Other Ambulatory Visit: Payer: Self-pay | Admitting: Hematology

## 2021-12-26 DIAGNOSIS — M25512 Pain in left shoulder: Secondary | ICD-10-CM

## 2021-12-27 ENCOUNTER — Other Ambulatory Visit: Payer: Self-pay | Admitting: Internal Medicine

## 2021-12-28 ENCOUNTER — Other Ambulatory Visit: Payer: Self-pay | Admitting: Internal Medicine

## 2021-12-29 ENCOUNTER — Encounter: Payer: Self-pay | Admitting: Surgery

## 2021-12-29 NOTE — Telephone Encounter (Signed)
Requested Prescriptions  ?Pending Prescriptions Disp Refills  ?? levothyroxine (SYNTHROID) 112 MCG tablet [Pharmacy Med Name: LEVOTHYROXINE 112 MCG TABLET] 90 tablet 0  ?  Sig: TAKE 1 TABLET BY MOUTH DAILY BEFORE BREAKFAST.  ?  ? Endocrinology:  Hypothyroid Agents Passed - 12/28/2021 10:58 AM  ?  ?  Passed - TSH in normal range and within 360 days  ?  TSH  ?Date Value Ref Range Status  ?03/27/2021 0.47 mIU/L Final  ?  Comment:  ?            Reference Range ?. ?          > or = 20 Years  0.40-4.50 ?. ?               Pregnancy Ranges ?          First trimester    0.26-2.66 ?          Second trimester   0.55-2.73 ?          Third trimester    0.43-2.91 ?  ?   ?  ?  Passed - Valid encounter within last 12 months  ?  Recent Outpatient Visits   ?      ? 5 months ago Other fatigue  ? [facility] [place], [place] W, NP  ? 6 months ago Viral URI  ? [facility] [place], [place] W, NP  ? 9 months ago Acute non-recurrent frontal sinusitis  ? [facility] [place], Coralie Keens, NP  ? 11 months ago Encounter for general adult medical examination with abnormal findings  ? [facility] [place], Coralie Keens, NP  ?  ?  ? ?  ?  ?  ? ?

## 2021-12-29 NOTE — Telephone Encounter (Signed)
Requested Prescriptions  ?Pending Prescriptions Disp Refills  ?? atorvastatin (LIPITOR) 10 MG tablet [Pharmacy Med Name: ATORVASTATIN 10 MG TABLET] 90 tablet 1  ?  Sig: TAKE 1 TABLET BY MOUTH EVERY DAY  ?  ? Cardiovascular:  Antilipid - Statins Failed - 12/27/2021 10:04 AM  ?  ?  Failed - Lipid Panel in normal range within the last 12 months  ?  Cholesterol  ?Date Value Ref Range Status  ?06/23/2021 194 <200 mg/dL Final  ? ?LDL Cholesterol (Calc)  ?Date Value Ref Range Status  ?06/23/2021 125 (H) mg/dL (calc) Final  ?  Comment:  ?  Reference range: <100 ?[name] ?Desirable range <100 mg/dL for primary prevention;   ?<70 mg/dL for patients with CHD or diabetic patients  ?with > or = 2 CHD risk factors. ?. ?LDL-C is now calculated using the Martin-Hopkins  ?calculation, which is a validated novel method providing  ?better accuracy than the Friedewald equation in the  ?estimation of LDL-C.  ?Cresenciano Genre et al. Annamaria Helling. 9937;169(67): 2061-2068  ?(http://education.[employer].com/faq/FAQ164) ?  ? ?HDL  ?Date Value Ref Range Status  ?06/23/2021 40 (L) > OR = 50 mg/dL Final  ? ?Triglycerides  ?Date Value Ref Range Status  ?06/23/2021 174 (H) <150 mg/dL Final  ? ?  ?  ?  Passed - Patient is not pregnant  ?  ?  Passed - Valid encounter within last 12 months  ?  Recent Outpatient Visits   ?      ? 5 months ago Other fatigue  ? [facility] [place], [place] W, NP  ? 6 months ago Viral URI  ? [facility] [place], [place] W, NP  ? 9 months ago Acute non-recurrent frontal sinusitis  ? [facility] [place], Coralie Keens, NP  ? 11 months ago Encounter for general adult medical examination with abnormal findings  ? [facility] [place], Coralie Keens, NP  ?  ?  ? ?  ?  ?  ? ?

## 2021-12-31 ENCOUNTER — Other Ambulatory Visit: Payer: Self-pay | Admitting: Internal Medicine

## 2022-01-01 NOTE — Telephone Encounter (Signed)
Requested Prescriptions  ?Pending Prescriptions Disp Refills  ?? valACYclovir (VALTREX) 500 MG tablet [Pharmacy Med Name: VALACYCLOVIR HCL 500 MG TABLET] 90 tablet 0  ?  Sig: TAKE 1 TABLET (500 MG TOTAL) BY MOUTH DAILY AS NEEDED (HERPES SIMPLEX).  ?  ? Antimicrobials:  Antiviral Agents - Anti-Herpetic Passed - 12/31/2021  2:33 AM  ?  ?  Passed - Valid encounter within last 12 months  ?  Recent Outpatient Visits   ?      ? 5 months ago Other fatigue  ? [facility] [place], [place] W, NP  ? 6 months ago Viral URI  ? [facility] [place], [place] W, NP  ? 9 months ago Acute non-recurrent frontal sinusitis  ? [facility] [place], Coralie Keens, NP  ? 11 months ago Encounter for general adult medical examination with abnormal findings  ? [facility] [place], Coralie Keens, NP  ?  ?  ? ?  ?  ?  ? ?

## 2022-01-16 ENCOUNTER — Encounter: Payer: Self-pay | Admitting: Orthopedic Surgery

## 2022-01-16 ENCOUNTER — Ambulatory Visit: Payer: BC Managed Care – PPO | Admitting: Orthopedic Surgery

## 2022-01-16 DIAGNOSIS — G8929 Other chronic pain: Secondary | ICD-10-CM

## 2022-01-16 DIAGNOSIS — M25512 Pain in left shoulder: Secondary | ICD-10-CM

## 2022-01-16 NOTE — Progress Notes (Signed)
? ?Office Visit Note ?  ?Patient: [name]           ?Date of Birth: Nov 14, 1970           ?MRN: [id number] ?Visit Date: 01/16/2022 ?Requested by: Julie Fenton, NP ?[place] [address] ?PCP: Julie Fenton, NP ? ?Subjective: ?Chief Complaint  ?Patient presents with  ? Left Shoulder - Pain  ? ? ?HPI: [name] is a 51 year old patient with left shoulder pain.  Pain has been going on for several months.  Started after she finished 6 weeks of cancer treatment which included radiation therapy.  This was in July 2022.  No problems before the treatment.  Reports occasional radiating pain.  Describes some popping but no numbness and tingling in the arm and no neck pain.  Reports having a lot of scar tissue under her arms.  She has been going to physical therapy for scar tissue management with minimal relief.  She is left-hand dominant.  She works as an Tourist information centre manager.  Describes some pain with rotation.  Some days are better than others. ?             ?ROS: All systems reviewed are negative as they relate to the chief complaint within the history of present illness.  Patient denies  fevers or chills. ? ? ?Assessment & Plan: ?Visit Diagnoses: No diagnosis found. ? ?Plan: Impression is left shoulder pain but with fairly well-maintained strength as well as range of motion.  Plain radiographs unremarkable.  Low threshold for MRI scanning but the patient wants to hold off on that for now.  In general no red flags on exam today in terms of frozen shoulder rotator cuff weakness or other significant soft tissue or bony pathologies.  If her symptoms worsen MRI scanning would be the next step.  Follow-up with [place] as needed. ? ?Follow-Up Instructions: Return if symptoms worsen or fail to improve.  ? ?Orders:  ?No orders of the defined types were placed in this encounter. ? ?No orders of the defined types were placed in this encounter. ? ? ? ? Procedures: ?No procedures performed ? ? ?Clinical Data: ?No  additional findings. ? ?Objective: ?Vital Signs: There were no vitals taken for this visit. ? ?Physical Exam:  ? ?Constitutional: Patient appears well-developed ?HEENT:  ?Head: Normocephalic ?Eyes:EOM are normal ?Neck: Normal range of motion ?Cardiovascular: Normal rate ?Pulmonary/chest: Effort normal ?Neurologic: Patient is alert ?Skin: Skin is warm ?Psychiatric: Patient has normal mood and affect ?Ortho Exam: Ortho exam demonstrates good cervical spine range of motion flexion chin to chest extension 45 degrees rotation 50 degrees bilaterally.  5 out of 5 grip EPL FPL interosseous wrist flexion extension bicep triceps and deltoid strength.  No axillary lymphadenopathy on the left-hand side.  Shoulder range of motion is excellent on both shoulders at 70/110/175.  Rotator cuff strength intact infraspinatus x-ray subscap muscle testing.  Radial pulse intact.  Negative impingement signs.  Negative O'Brien's testing.  No discrete AC joint tenderness left versus right. ? ?Specialty Comments:  ?No specialty comments available. ? ?Imaging: ?No results found. ? ? ?PMFS History: ?Patient Active Problem List  ? Diagnosis Date Noted  ? Prediabetes 03/27/2021  ? Class 2 severe obesity due to excess calories with serious comorbidity and body mass index (BMI) of 36.0 to 36.9 in adult [facility]) 03/27/2021  ? B12 deficiency anemia 01/19/2021  ? PVC (premature ventricular contraction) 01/17/2021  ? Iron deficiency anemia 01/17/2021  ? Carcinoma of upper-outer  quadrant of left breast, estrogen receptor positive ([place]) 01/02/2021  ? HLD (hyperlipidemia) 08/19/2019  ? 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 GAD (generalized anxiety disorder) 06/03/2018  ? Chronic headaches 09/21/2013  ? Hypothyroidism 09/21/2013  ? ?Past Medical History:  ?Diagnosis Date  ? Allergy   ? Anemia   ? present for many years, mild, stable  ? Anxiety   ? Breast cancer ([place])   ? Breast cancer ([place]) 12/18/2020  ? Dysrhythmia   ? PAC's on flecanide  (did not tolerate beta blockers)  ? Family history of breast cancer 01/20/2021  ? Family history of prostate cancer 01/20/2021  ? Frequent headaches   ? HSV infection   ? fever blisters  ? Hypertension   ? Hypothyroidism   ? Personal history of radiation therapy   ?  ?Family History  ?Problem Relation Age of Onset  ? Depression Mother   ? Breast cancer Mother 44  ? Hyperlipidemia Father   ? Prostate cancer Father 54  ?  ?Past Surgical History:  ?Procedure Laterality Date  ? APPENDECTOMY  2013  ? BREAST BIOPSY Left 10/2020  ? BREAST LUMPECTOMY Left 12/18/2020  ? BREAST LUMPECTOMY WITH RADIOACTIVE SEED LOCALIZATION Left 12/18/2020  ? Procedure: LEFT BREAST LUMPECTOMY WITH RADIOACTIVE SEED LOCALIZATION;  Surgeon: Erroll Luna, MD;  Location: [facility];  Service: General;  Laterality: Left;  ? KNEE ARTHROSCOPY Right   ? lapband  2013  ? SENTINEL NODE BIOPSY Left 01/07/2021  ? Procedure: LEFT SENTINEL NODE BIOPSY;  Surgeon: Erroll Luna, MD;  Location: [facility];  Service: General;  Laterality: Left;  ? TONSILLECTOMY  2012  ? ?Social History  ? ?Occupational History  ? Not on file  ?Tobacco Use  ? Smoking status: Never  ? Smokeless tobacco: Never  ?Vaping Use  ? Vaping Use: Never used  ?Substance and Sexual Activity  ? Alcohol use: Yes  ?  Comment: occasional  ? Drug use: No  ? Sexual activity: Yes  ?  Partners: Male  ?  Birth control/protection: Pill  ? ? ? ? ? ?

## 2022-01-28 ENCOUNTER — Other Ambulatory Visit: Payer: Self-pay | Admitting: Internal Medicine

## 2022-01-28 ENCOUNTER — Other Ambulatory Visit: Payer: Self-pay | Admitting: Hematology

## 2022-01-29 MED ORDER — CYCLOBENZAPRINE HCL 10 MG PO TABS
10.0000 mg | ORAL_TABLET | Freq: Every day | ORAL | 0 refills | Status: DC | PRN
Start: 2022-01-29 — End: 2022-03-18

## 2022-01-29 NOTE — Telephone Encounter (Signed)
Pt was seen by Dr. Alena Bills and Dr. Marlou Sa and both did not mention she needs narcotics for her shoulder pain. I called pt and let her a message to call back why she needs refill.   [name]  01/28/2022

## 2022-02-06 ENCOUNTER — Other Ambulatory Visit: Payer: Self-pay | Admitting: Hematology

## 2022-02-06 ENCOUNTER — Other Ambulatory Visit: Payer: Self-pay | Admitting: Internal Medicine

## 2022-02-08 ENCOUNTER — Encounter: Payer: Self-pay | Admitting: Hematology

## 2022-02-09 NOTE — Telephone Encounter (Signed)
Requested Prescriptions  Pending Prescriptions Disp Refills  . FLUoxetine (PROZAC) 10 MG capsule [Pharmacy Med Name: FLUOXETINE HCL 10 MG CAPSULE] 90 capsule 1    Sig: TAKE 1 CAPSULE BY MOUTH EVERY DAY     Psychiatry:  Antidepressants - SSRI Failed - 02/06/2022 10:48 PM      Failed - Valid encounter within last 6 months    Recent Outpatient Visits          6 months ago Other fatigue   [facility] [place], Coralie Keens, NP   8 months ago Viral URI   [facility] [place], [place] W, NP   10 months ago Acute non-recurrent frontal sinusitis   [facility] [place], Coralie Keens, NP   1 year ago Encounter for general adult medical examination with abnormal findings   [facility], Coralie Keens, NP      Future Appointments            Tomorrow Garnette Gunner, Coralie Keens, NP [facility]           . levothyroxine (SYNTHROID) 112 MCG tablet [Pharmacy Med Name: LEVOTHYROXINE 112 MCG TABLET] 90 tablet 0    Sig: TAKE 1 TABLET BY MOUTH EVERY DAY BEFORE BREAKFAST     Endocrinology:  Hypothyroid Agents Passed - 02/06/2022 10:48 PM      Passed - TSH in normal range and within 360 days    TSH  Date Value Ref Range Status  03/27/2021 0.47 mIU/L Final    Comment:              Reference Range .           > or = 20 Years  0.40-4.50 .                Pregnancy Ranges           First trimester    0.26-2.66           Second trimester   0.55-2.73           Third trimester    0.43-2.91          Passed - Valid encounter within last 12 months    Recent Outpatient Visits          6 months ago Other fatigue   [facility] [place], Coralie Keens, NP   8 months ago [facility] [place], [place], NP   10 months ago Acute non-recurrent frontal sinusitis   [facility], Coralie Keens, NP   1 year ago Encounter for general adult medical examination with abnormal findings   [facility], Coralie Keens, NP      Future Appointments            Tomorrow Garnette Gunner, Coralie Keens, NP [facility], [facility]

## 2022-02-09 NOTE — Telephone Encounter (Signed)
Requested medications are due for refill today.  unsure  Requested medications are on the active medications list.  no  Last refill. 04/25/2021 #90 1 refill  Future visit scheduled.   no  Notes to clinic.  Medication was discontinued 08/08/2021.    Requested Prescriptions  Pending Prescriptions Disp Refills   FLUoxetine (PROZAC) 10 MG capsule [Pharmacy Med Name: FLUOXETINE HCL 10 MG CAPSULE] 90 capsule 1    Sig: TAKE 1 CAPSULE BY MOUTH EVERY DAY     Psychiatry:  Antidepressants - SSRI Failed - 02/06/2022 10:48 PM      Failed - Valid encounter within last 6 months    Recent Outpatient Visits           6 months ago Other fatigue   [facility] [place], Coralie Keens, NP   8 months ago Viral URI   [facility] [place], [place], NP   10 months ago Acute non-recurrent frontal sinusitis   [facility] [place], Coralie Keens, NP   1 year ago Encounter for general adult medical examination with abnormal findings   [facility], Coralie Keens, NP       Future Appointments             Tomorrow Garnette Gunner, Coralie Keens, NP [facility], [facility]             Signed Prescriptions Disp Refills   levothyroxine (SYNTHROID) 112 MCG tablet 90 tablet 0    Sig: TAKE 1 TABLET BY MOUTH EVERY DAY BEFORE BREAKFAST     Endocrinology:  Hypothyroid Agents Passed - 02/06/2022 10:48 PM      Passed - TSH in normal range and within 360 days    TSH  Date Value Ref Range Status  03/27/2021 0.47 mIU/L Final    Comment:              Reference Range .           > or = 20 Years  0.40-4.50 .                Pregnancy Ranges           First trimester    0.26-2.66           Second trimester   0.55-2.73           Third trimester    0.43-2.91          Passed - Valid encounter within last 12 months    Recent Outpatient Visits           6 months ago Other fatigue   [facility] [place], Coralie Keens, NP   8 months ago [facility] [place], [place], NP   10 months ago Acute non-recurrent frontal sinusitis   [facility] [place], Coralie Keens, NP   1 year ago Encounter for general adult medical examination with abnormal findings   [facility], Coralie Keens, NP       Future Appointments             Tomorrow Garnette Gunner, Coralie Keens, NP [facility], [facility]

## 2022-02-10 ENCOUNTER — Telehealth: Payer: BC Managed Care – PPO | Admitting: Internal Medicine

## 2022-02-10 ENCOUNTER — Encounter: Payer: Self-pay | Admitting: Internal Medicine

## 2022-02-10 NOTE — Progress Notes (Deleted)
Virtual Visit via Video Note  I connected with [name] on 02/10/22 at  4:00 PM EDT by a video enabled telemedicine application and verified that I am speaking with the correct person using two identifiers.  Location: Patient: *** Provider: Office  Persons participating in this video call: Webb Silversmith, NP and [name]   I discussed the limitations of evaluation and management by telemedicine and the availability of in person appointments. The patient expressed understanding and agreed to proceed.  History of Present Illness:  Patient reports.   Past Medical History:  Diagnosis Date   Allergy    Anemia    present for many years, mild, stable   Anxiety    Breast cancer ([place])    Breast cancer ([place]) 12/18/2020   Dysrhythmia    PAC's on flecanide (did not tolerate beta blockers)   Family history of breast cancer 01/20/2021   Family history of prostate cancer 01/20/2021   Frequent headaches    HSV infection    fever blisters   Hypertension    Hypothyroidism    Personal history of radiation therapy     Current Outpatient Medications  Medication Sig Dispense Refill   ALPRAZolam (XANAX) 0.25 MG tablet Take 1 tablet (0.25 mg total) by mouth 2 (two) times daily as needed. 20 tablet 0   atorvastatin (LIPITOR) 10 MG tablet TAKE 1 TABLET BY MOUTH EVERY DAY 90 tablet 1   butalbital-acetaminophen-caffeine (FIORICET) 50-325-40 MG tablet TAKE 1 TABLET BY MOUTH EVERY 6 (SIX) HOURS AS NEEDED FOR HEADACHE. 20 tablet 2   cyclobenzaprine (FLEXERIL) 10 MG tablet Take 1 tablet (10 mg total) by mouth daily as needed (migraines). 15 tablet 0   flecainide (TAMBOCOR) 50 MG tablet TAKE 1 TABLET BY MOUTH TWICE A DAY (Patient taking differently: Take 50 mg by mouth 2 (two) times daily.) 180 tablet 1   fluticasone (FLONASE) 50 MCG/ACT nasal spray SPRAY 2 SPRAYS INTO EACH NOSTRIL EVERY DAY (Patient not taking: Reported on 10/30/2021) 48 mL 0   gabapentin (NEURONTIN) 100 MG capsule  Take 1 capsule (100 mg total) by mouth 3 (three) times daily as needed. Use in addition to '300mg'$  dose 90 capsule 1   gabapentin (NEURONTIN) 300 MG capsule TAKE 1 CAPSULE BY MOUTH TWICE A DAY 60 capsule 1   levocetirizine (XYZAL) 5 MG tablet TAKE 1 TABLET BY MOUTH EVERY DAY IN THE EVENING 90 tablet 3   levothyroxine (SYNTHROID) 112 MCG tablet TAKE 1 TABLET BY MOUTH EVERY DAY BEFORE BREAKFAST 90 tablet 0   LORazepam (ATIVAN) 0.5 MG tablet Take 0.5 mg by mouth every 8 (eight) hours as needed.     losartan (COZAAR) 50 MG tablet Take 1 tablet (50 mg total) by mouth daily. 90 tablet 3   oxyCODONE (OXY IR/ROXICODONE) 5 MG immediate release tablet Take 1 tablet (5 mg total) by mouth every 6 (six) hours as needed for severe pain. 30 tablet 0   potassium chloride (KLOR-CON M) 10 MEQ tablet Take 1 tablet (10 mEq total) by mouth 2 (two) times daily. 30 tablet 1   rizatriptan (MAXALT-MLT) 10 MG disintegrating tablet Take 1 tablet (10 mg total) by mouth as needed for migraine. May repeat in 2 hours if needed 10 tablet 1   SUMAtriptan (IMITREX) 25 MG tablet TAKE 1 TABLET (25 MG TOTAL) BY MOUTH EVERY 2 (TWO) HOURS AS NEEDED FOR MIGRAINE. MAY REPEAT IN 2 HOURS IF HEADACHE PERSISTS OR RECURS. 10 tablet 0   tamoxifen (NOLVADEX) 20 MG tablet TAKE 1  TABLET BY MOUTH EVERY DAY 90 tablet 1   traMADol (ULTRAM) 50 MG tablet Take 1 tablet (50 mg total) by mouth every 12 (twelve) hours as needed. 20 tablet 0   valACYclovir (VALTREX) 500 MG tablet TAKE 1 TABLET (500 MG TOTAL) BY MOUTH DAILY AS NEEDED (HERPES SIMPLEX). 90 tablet 0   venlafaxine XR (EFFEXOR-XR) 75 MG 24 hr capsule TAKE 1 CAPSULE BY MOUTH DAILY WITH BREAKFAST. 90 capsule 1   No current facility-administered medications for this visit.    Allergies  Allergen Reactions   Ciprofloxacin     anxiety   Keflex [Cephalexin] Hives   Trazodone And Nefazodone Other (See Comments)    nightmares    Family History  Problem Relation Age of Onset   Depression Mother     Breast cancer Mother 14   Hyperlipidemia Father    Prostate cancer Father 56    Social History   Socioeconomic History   Marital status: Single    Spouse name: Not on file   Number of children: 1   Years of education: Not on file   Highest education level: Not on file  Occupational History   Not on file  Tobacco Use   Smoking status: Never   Smokeless tobacco: Never  Vaping Use   Vaping Use: Never used  Substance and Sexual Activity   Alcohol use: Yes    Comment: occasional   Drug use: No   Sexual activity: Yes    Partners: Male    Birth control/protection: Pill  Other Topics Concern   Not on file  Social History Narrative   Not on file   Social Determinants of Health   Financial Resource Strain: Not on file  Food Insecurity: Not on file  Transportation Needs: Not on file  Physical Activity: Not on file  Stress: Not on file  Social Connections: Not on file  Intimate Partner Violence: Not on file     Constitutional: Denies fever, malaise, fatigue, headache or abrupt weight changes.  HEENT: Denies eye pain, eye redness, ear pain, ringing in the ears, wax buildup, runny nose, nasal congestion, bloody nose, or sore throat. Respiratory: Denies difficulty breathing, shortness of breath, cough or sputum production.   Cardiovascular: Denies chest pain, chest tightness, palpitations or swelling in the hands or feet.  Gastrointestinal: Denies abdominal pain, bloating, constipation, diarrhea or blood in the stool.  GU: Denies urgency, frequency, pain with urination, burning sensation, blood in urine, odor or discharge. Musculoskeletal: Denies decrease in range of motion, difficulty with gait, muscle pain or joint pain and swelling.  Skin: Denies redness, rashes, lesions or ulcercations.  Neurological: Denies dizziness, difficulty with memory, difficulty with speech or problems with balance and coordination.  Psych: Denies anxiety, depression, SI/HI.  No other specific  complaints in a complete review of systems (except as listed in HPI above).    Observations/Objective:  There were no vitals taken for this visit. Wt Readings from Last 3 Encounters:  12/19/21 209 lb 14.4 oz (95.2 kg)  10/30/21 207 lb (93.9 kg)  08/11/21 210 lb 14.4 oz (95.7 kg)    General: Appears her stated age, well developed, well nourished in NAD. HEENT: Head: normal shape and size; Nose: ; Throat/Mouth:  Pulmonary/Chest: Normal effort. No respiratory distress. Neurological: Alert and oriented.   BMET    Component Value Date/Time   NA 136 12/19/2021 1245   NA 143 01/26/2020 1600   K 3.2 (L) 12/19/2021 1245   CL 100 12/19/2021 1245   CO2  29 12/19/2021 1245   GLUCOSE 101 (H) 12/19/2021 1245   BUN 6 12/19/2021 1245   BUN 11 01/26/2020 1600   CREATININE 0.93 12/19/2021 1245   CREATININE 1.46 (H) 07/18/2021 1608   CALCIUM 9.1 12/19/2021 1245   GFRNONAA >60 12/19/2021 1245   GFRAA >60 02/25/2020 2231    Lipid Panel     Component Value Date/Time   CHOL 194 06/23/2021 1554   TRIG 174 (H) 06/23/2021 1554   HDL 40 (L) 06/23/2021 1554   CHOLHDL 4.9 06/23/2021 1554   VLDL 18.8 12/21/2019 1204   LDLCALC 125 (H) 06/23/2021 1554    CBC    Component Value Date/Time   WBC 4.6 12/19/2021 1245   WBC 4.5 10/17/2021 0842   RBC 4.25 12/19/2021 1245   HGB 12.9 12/19/2021 1245   HGB 10.1 (L) 04/25/2020 1154   HCT 37.8 12/19/2021 1245   HCT 31.6 (L) 04/25/2020 1154   PLT 189 12/19/2021 1245   PLT 261 04/25/2020 1154   MCV 88.9 12/19/2021 1245   MCV 80 04/25/2020 1154   MCH 30.4 12/19/2021 1245   MCHC 34.1 12/19/2021 1245   RDW 13.5 12/19/2021 1245   RDW 16.8 (H) 04/25/2020 1154   LYMPHSABS 1.2 12/19/2021 1245   LYMPHSABS 1.4 04/25/2020 1154   MONOABS 0.4 12/19/2021 1245   EOSABS 0.0 12/19/2021 1245   EOSABS 0.0 04/25/2020 1154   BASOSABS 0.1 12/19/2021 1245   BASOSABS 0.1 04/25/2020 1154    Hgb A1C Lab Results  Component Value Date   HGBA1C 5.4 03/27/2021        Assessment and Plan:   Schedule an appointment for follow-up of chronic conditions  Follow Up Instructions:    I discussed the assessment and treatment plan with the patient. The patient was provided an opportunity to ask questions and all were answered. The patient agreed with the plan and demonstrated an understanding of the instructions.   The patient was advised to call back or seek an in-person evaluation if the symptoms worsen or if the condition fails to improve as anticipated.    Webb Silversmith, NP

## 2022-02-18 ENCOUNTER — Other Ambulatory Visit: Payer: Self-pay | Admitting: Hematology

## 2022-03-08 ENCOUNTER — Other Ambulatory Visit: Payer: Self-pay | Admitting: Hematology

## 2022-03-17 ENCOUNTER — Other Ambulatory Visit: Payer: Self-pay | Admitting: Internal Medicine

## 2022-03-17 ENCOUNTER — Encounter (HOSPITAL_COMMUNITY): Payer: Self-pay

## 2022-03-18 ENCOUNTER — Inpatient Hospital Stay: Payer: BC Managed Care – PPO | Attending: Hematology | Admitting: Adult Health

## 2022-03-18 ENCOUNTER — Other Ambulatory Visit: Payer: Self-pay

## 2022-03-18 ENCOUNTER — Encounter: Payer: Self-pay | Admitting: Adult Health

## 2022-03-18 VITALS — BP 142/83 | HR 86 | Temp 98.1°F | Resp 16 | Ht 66.0 in | Wt 205.8 lb

## 2022-03-18 DIAGNOSIS — N6320 Unspecified lump in the left breast, unspecified quadrant: Secondary | ICD-10-CM | POA: Insufficient documentation

## 2022-03-18 DIAGNOSIS — Z170 Estrogen receptor positive status [ER+]: Secondary | ICD-10-CM | POA: Diagnosis not present

## 2022-03-18 DIAGNOSIS — C50412 Malignant neoplasm of upper-outer quadrant of left female breast: Secondary | ICD-10-CM | POA: Diagnosis present

## 2022-03-18 DIAGNOSIS — Z803 Family history of malignant neoplasm of breast: Secondary | ICD-10-CM | POA: Diagnosis not present

## 2022-03-18 NOTE — Progress Notes (Signed)
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, estrogen receptor positive ([place]) Staging form: Breast, AJCC 8th Edition - Pathologic stage from 01/02/2021: Stage IA (pT1c, pN0, cM0, G2, ER+, PR+, HER2-) - Signed by Alla Feeling, NP on 05/12/2021 Method of lymph node assessment: Clinical Multigene prognostic tests performed: None Histologic grading system: 3 grade system Residual tumor (R): R0 - None   SUMMARY OF ONCOLOGIC HISTORY: Oncology History Overview Note  Cancer Staging 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  IMPRESSION: Indeterminate left breast mass at 2 o'clock 7 cm from the nipple. Targeted ultrasound of the left axilla demonstrates normal lymph nodes.   10/31/2020 Pathology Results   Breast, left, needle core biopsy, 2 o'clock - FIBROADIPOSE TISSUE WITH MINIMAL BREAST PARENCHYMA DEMONSTRATING MILD FIBROCYSTIC CHANGE TO INCLUDE APOCRINE METAPLASIA.  This was found to be discordant.   12/18/2020 Surgery   A. BREAST, LEFT, LUMPECTOMY:  - Invasive lobular carcinoma, 1.1 cm, grade 2.  See comment  - Resection margins are negative for carcinoma; inferior margin is  focally less than 1 mm from carcinoma and medial margin is approximately  1 mm from carcinoma  - Biopsy related changes  - Background  fibrocystic change   PROGNOSTIC INDICATOR RESULTS:  - The tumor cells are NEGATIVE for Her2 (0).  - Estrogen Receptor:       POSITIVE, 30%, WEAK STAINING  - Progesterone Receptor:   POSITIVE, 80%, MODERATE STAINING  - Proliferation Marker Ki-67:   <5%    12/18/2020 Oncotype testing   Oncotype  Recurrence Score 16  Distant recurrence risk at 9 years of 4% with AI or Tamoxifen alone.  Less than 1% benefit of chemotherapy.    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 LYMPH NODE, LEFT, SENTINEL, EXCISION:  - One lymph node negative for metastatic carcinoma (0/1).   B. LYMPH NODE, LEFT, SENTINEL, EXCISION:  - One lymph node negative for metastatic carcinoma (0/1).   COMMENT:  Immunohistochemistry for cytokeratin AE1/AE3 is performed on parts A and  B and no metastatic carcinoma is identified   02/05/2021 - 03/19/2021 Radiation Therapy   Adjuvant Radiation with Dr Lisbeth Renshaw    05/13/2021 -  Anti-estrogen oral therapy   Tamoxifen 65m once daily starting in September 2022   05/13/2021 Survivorship   SCP delivered by LCira Rue NP     CURRENT THERAPY: Has not yet started tamoxifen  INTERVAL HISTORY: [name] 51 y.o. female returns for follow-up of her history of estrogen positive breast cancer.  She is doing moderately well today.  Her main issue continues to be left breast swelling and scar tissue that she is following up with Dr. Brantley Stage about.  She is also  undergoing physical therapy for this however has not noted an improvement that justifies her out-of-pocket cost of $350.  Her most recent mammogram was completed 12/19/2021 and demonstrated no evidence of malignancy and breast density category b.  She did have a postsurgical seroma noted on her imaging.    [name] has not yet started taking tamoxifen.  She tells me that she had been exercising regularly until she recently went home and has gotten out of the habit but plans to restart soon.   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  MEDICAL HISTORY: Past Medical History:  Diagnosis Date   Allergy    Anemia    present for many years, mild, stable   Anxiety    Breast cancer ([place])    Breast cancer ([place]) 12/18/2020   Dysrhythmia    PAC's on flecanide (did not tolerate beta blockers)   Family history of breast cancer 01/20/2021   Family history of prostate cancer 01/20/2021   Frequent headaches    HSV infection    fever blisters   Hypertension    Hypothyroidism    Personal history of radiation therapy     SURGICAL HISTORY: Past Surgical History:  Procedure Laterality Date   APPENDECTOMY  2013   BREAST BIOPSY Left 10/2020   BREAST LUMPECTOMY Left 12/18/2020   BREAST LUMPECTOMY WITH RADIOACTIVE SEED LOCALIZATION  Left 12/18/2020   Procedure: LEFT BREAST LUMPECTOMY WITH RADIOACTIVE SEED LOCALIZATION;  Surgeon: Erroll Luna, MD;  Location: [facility];  Service: General;  Laterality: Left;   KNEE ARTHROSCOPY Right    lapband  2013   SENTINEL NODE BIOPSY Left 01/07/2021   Procedure: LEFT SENTINEL NODE BIOPSY;  Surgeon: Erroll Luna, MD;  Location: [facility];  Service: General;  Laterality: Left;   TONSILLECTOMY  2012    SOCIAL HISTORY: Social History   Socioeconomic History   Marital status: Single    Spouse name: Not on file   Number of children: 1   Years of education: Not on file   Highest education level: Not on file  Occupational History   Not on file  Tobacco Use   Smoking status: Never   Smokeless tobacco: Never  Vaping Use   Vaping Use: Never used  Substance and Sexual Activity   Alcohol use: Yes    Comment: occasional   Drug use: No   Sexual activity: Yes    Partners: Male    Birth control/protection: Pill  Other Topics Concern   Not on file  Social History Narrative   Not on file  Social Determinants of Health   Financial Resource Strain: Not on file  Food Insecurity: Not on file  Transportation Needs: Not on file  Physical Activity: Not on file  Stress: Not on file  Social Connections: Not on file  Intimate Partner Violence: Not At Risk (01/02/2021)   Humiliation, Afraid, Rape, and Kick questionnaire    Fear of Current or Ex-Partner: No    Emotionally Abused: No    Physically Abused: No    Sexually Abused: No    FAMILY HISTORY: Family History  Problem Relation Age of Onset   Depression Mother    Breast cancer Mother 39   Hyperlipidemia Father    Prostate cancer Father 31    Review of Systems  Constitutional:  Negative for appetite change, chills, fatigue, fever and unexpected weight change.  HENT:   Negative for hearing loss, lump/mass and trouble swallowing.   Eyes:  Negative for eye problems and icterus.  Respiratory:  Negative for  chest tightness, cough and shortness of breath.   Cardiovascular:  Negative for chest pain, leg swelling and palpitations.  Gastrointestinal:  Negative for abdominal distention, abdominal pain, constipation, diarrhea, nausea and vomiting.  Endocrine: Negative for hot flashes.  Genitourinary:  Negative for difficulty urinating.   Musculoskeletal:  Negative for arthralgias.  Skin:  Negative for itching and rash.  Neurological:  Negative for dizziness, extremity weakness, headaches and numbness.  Hematological:  Negative for adenopathy. Does not bruise/bleed easily.  Psychiatric/Behavioral:  Negative for depression. The patient is not nervous/anxious.       PHYSICAL EXAMINATION  ECOG PERFORMANCE STATUS: 1 - Symptomatic but completely ambulatory  Vitals:   03/18/22 1012  BP: (!) 142/83  Pulse: 86  Resp: 16  Temp: 98.1 F (36.7 C)  SpO2: 97%    Physical Exam Constitutional:      General: She is not in acute distress.    Appearance: Normal appearance. She is not toxic-appearing.  HENT:     Head: Normocephalic and atraumatic.  Eyes:     General: No scleral icterus. Cardiovascular:     Rate and Rhythm: Normal rate and regular rhythm.     Pulses: Normal pulses.     Heart sounds: Normal heart sounds.  Pulmonary:     Effort: Pulmonary effort is normal.     Breath sounds: Normal breath sounds.  Chest:     Comments: Right breast is benign left breast status postlumpectomy and radiation slight swelling noted seroma noted at lumpectomy site otherwise benign.  No sign of local recurrence. Abdominal:     General: Abdomen is flat. Bowel sounds are normal. There is no distension.     Palpations: Abdomen is soft.     Tenderness: There is no abdominal tenderness.  Musculoskeletal:        General: No swelling.     Cervical back: Neck supple.  Lymphadenopathy:     Cervical: No cervical adenopathy.  Skin:    General: Skin is warm and dry.     Findings: No rash.  Neurological:      General: No focal deficit present.     Mental Status: She is alert.  Psychiatric:        Mood and Affect: Mood normal.        Behavior: Behavior normal.     LABORATORY DATA:  CBC    Component Value Date/Time   WBC 4.6 12/19/2021 1245   WBC 4.5 10/17/2021 0842   RBC 4.25 12/19/2021 1245   HGB 12.9 12/19/2021 1245  HGB 10.1 (L) 04/25/2020 1154   HCT 37.8 12/19/2021 1245   HCT 31.6 (L) 04/25/2020 1154   PLT 189 12/19/2021 1245   PLT 261 04/25/2020 1154   MCV 88.9 12/19/2021 1245   MCV 80 04/25/2020 1154   MCH 30.4 12/19/2021 1245   MCHC 34.1 12/19/2021 1245   RDW 13.5 12/19/2021 1245   RDW 16.8 (H) 04/25/2020 1154   LYMPHSABS 1.2 12/19/2021 1245   LYMPHSABS 1.4 04/25/2020 1154   MONOABS 0.4 12/19/2021 1245   EOSABS 0.0 12/19/2021 1245   EOSABS 0.0 04/25/2020 1154   BASOSABS 0.1 12/19/2021 1245   BASOSABS 0.1 04/25/2020 1154    CMP     Component Value Date/Time   NA 136 12/19/2021 1245   NA 143 01/26/2020 1600   K 3.2 (L) 12/19/2021 1245   CL 100 12/19/2021 1245   CO2 29 12/19/2021 1245   GLUCOSE 101 (H) 12/19/2021 1245   BUN 6 12/19/2021 1245   BUN 11 01/26/2020 1600   CREATININE 0.93 12/19/2021 1245   CREATININE 1.46 (H) 07/18/2021 1608   CALCIUM 9.1 12/19/2021 1245   PROT 6.9 12/19/2021 1245   ALBUMIN 4.0 12/19/2021 1245   AST 14 (L) 12/19/2021 1245   ALT 9 12/19/2021 1245   ALKPHOS 121 12/19/2021 1245   BILITOT 0.4 12/19/2021 1245   GFRNONAA >60 12/19/2021 1245   GFRAA >60 02/25/2020 2231       ASSESSMENT and THERAPY PLAN:   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  We discussed her risk of recurrence with and without tamoxifen and she is going to  continue contemplate starting.  She notes she recently lost her mom to breast cancer recurrence and she is motivated to reduce her risk of recurrence but also remembers her mom having issues with the tamoxifen.  I suggested that she consider starting tamoxifen at 10 mg daily and after couple months increase that to 20 mg daily which she is willing to consider.  She is due for repeat mammogram in April 2024.  I recommended she continue with her annual mammograms.  [name] will follow-up with Dr. Brantley Stage in 3 months, and Dr. Burr Medico in 6 months.  I will send him a message to make sure that she is on the call list for their office accordingly.    All questions were answered. The patient knows to call the clinic with any problems, questions or concerns. We can certainly see the patient much sooner if necessary.  Total encounter time:20 minutes*in face-to-face visit time, chart review, lab review, care coordination, order entry, and documentation of the encounter time.  Wilber Bihari, NP 03/18/22 12:15 PM Medical Oncology and Hematology [facility] [place], [place] [address] Tel. [phone]    Fax. [phone]  *Total Encounter Time as defined by the Centers for Medicare and Medicaid Services includes, in addition to the face-to-face time of a patient visit (documented in the note above) non-face-to-face time: obtaining and reviewing outside history, ordering and reviewing medications, tests or procedures, care coordination (communications with other health care professionals or caregivers) and documentation in the medical record.

## 2022-03-18 NOTE — Assessment & Plan Note (Addendum)
[name] is a 51 year old woman with history of left-sided stage Ia estrogen progesterone positive breast cancer diagnosed in February 2022 treated with lumpectomy, adjuvant radiation, and was prescribed tamoxifen in September 2022 which she decided not to start.  We discussed her risk of recurrence with and without tamoxifen and she is going to continue contemplate starting.  She notes she recently lost her mom to breast cancer recurrence and she is motivated to reduce her risk of recurrence but also remembers her mom having issues with the tamoxifen.  I suggested that she consider starting tamoxifen at 10 mg daily and after couple months increase that to 20 mg daily which she is willing to consider.  She is due for repeat mammogram in April 2024.  I recommended she continue with her annual mammograms.  [name] will follow-up with Dr. Brantley Stage in 3 months, and Dr. Burr Medico in 6 months.  I will send him a message to make sure that she is on the call list for their office accordingly.

## 2022-03-18 NOTE — Telephone Encounter (Signed)
Requested medication (s) are due for refill today: yes  Requested medication (s) are on the active medication list: yes    Last refill: 01/29/22  #15  0 refills  Future visit scheduled no  Notes to clinic:Not delegated, please review. Thank you.  Requested Prescriptions  Pending Prescriptions Disp Refills   cyclobenzaprine (FLEXERIL) 10 MG tablet [Pharmacy Med Name: CYCLOBENZAPRINE 10 MG TABLET] 15 tablet 0    Sig: Take 1 tablet (10 mg total) by mouth daily as needed (migraines).     There is no refill protocol information for this order

## 2022-03-20 ENCOUNTER — Encounter: Payer: Self-pay | Admitting: Internal Medicine

## 2022-03-20 ENCOUNTER — Other Ambulatory Visit: Payer: Self-pay | Admitting: Internal Medicine

## 2022-03-20 DIAGNOSIS — G8929 Other chronic pain: Secondary | ICD-10-CM

## 2022-03-23 ENCOUNTER — Other Ambulatory Visit: Payer: Self-pay | Admitting: Internal Medicine

## 2022-03-23 MED ORDER — LEVOTHYROXINE SODIUM 112 MCG PO TABS: ORAL_TABLET | ORAL | 0 refills | Status: DC

## 2022-03-23 MED ORDER — BUTALBITAL-APAP-CAFFEINE 50-325-40 MG PO TABS: 1.0000 | ORAL_TABLET | Freq: Four times a day (QID) | ORAL | 0 refills | Status: DC | PRN

## 2022-03-23 MED ORDER — LEVOCETIRIZINE DIHYDROCHLORIDE 5 MG PO TABS: ORAL_TABLET | ORAL | 0 refills | Status: DC

## 2022-03-24 NOTE — Telephone Encounter (Signed)
Refilled 03/23/22 by Webb Silversmith NP

## 2022-04-15 ENCOUNTER — Encounter: Payer: Self-pay | Admitting: Internal Medicine

## 2022-04-15 ENCOUNTER — Ambulatory Visit (INDEPENDENT_AMBULATORY_CARE_PROVIDER_SITE_OTHER): Payer: BC Managed Care – PPO | Admitting: Internal Medicine

## 2022-04-15 VITALS — BP 136/86 | HR 88 | Temp 96.5°F | Ht 65.0 in | Wt 207.0 lb

## 2022-04-15 DIAGNOSIS — Z79899 Other long term (current) drug therapy: Secondary | ICD-10-CM | POA: Diagnosis not present

## 2022-04-15 DIAGNOSIS — E6609 Other obesity due to excess calories: Secondary | ICD-10-CM

## 2022-04-15 DIAGNOSIS — Z0001 Encounter for general adult medical examination with abnormal findings: Secondary | ICD-10-CM | POA: Diagnosis not present

## 2022-04-15 DIAGNOSIS — Z1211 Encounter for screening for malignant neoplasm of colon: Secondary | ICD-10-CM

## 2022-04-15 DIAGNOSIS — E039 Hypothyroidism, unspecified: Secondary | ICD-10-CM

## 2022-04-15 DIAGNOSIS — Z6834 Body mass index (BMI) 34.0-34.9, adult: Secondary | ICD-10-CM

## 2022-04-15 NOTE — Progress Notes (Signed)
Subjective:    Patient ID: [name], female    DOB: May 07, 1971, 51 y.o.   MRN: [id number]  HPI  Patient presents to clinic today for her annual exam.  Flu: never Tetanus: 07/2016 COVID: Pfizer x 2 Pap smear: 2023 Mammogram: 12/2021, [facility] Colon screening: never Vision screening: as needed Dentist: biannually  Diet: She does eat meat. She consumes more veggies than fruits. She tries to avoid fried foods. She drinks mostly tea.  Exercise: None  Review of Systems     Past Medical History:  Diagnosis Date   Allergy    Anemia    present for many years, mild, stable   Anxiety    Breast cancer ([place])    Breast cancer ([place]) 12/18/2020   Dysrhythmia    PAC's on flecanide (did not tolerate beta blockers)   Family history of breast cancer 01/20/2021   Family history of prostate cancer 01/20/2021   Frequent headaches    HSV infection    fever blisters   Hypertension    Hypothyroidism    Personal history of radiation therapy     Current Outpatient Medications  Medication Sig Dispense Refill   ALPRAZolam (XANAX) 0.25 MG tablet Take 1 tablet (0.25 mg total) by mouth 2 (two) times daily as needed. 20 tablet 0   atorvastatin (LIPITOR) 10 MG tablet TAKE 1 TABLET BY MOUTH EVERY DAY 90 tablet 1   butalbital-acetaminophen-caffeine (FIORICET) 50-325-40 MG tablet Take 1 tablet by mouth every 6 (six) hours as needed for headache. 20 tablet 0   cyclobenzaprine (FLEXERIL) 10 MG tablet TAKE 1 TABLET (10 MG TOTAL) BY MOUTH DAILY AS NEEDED (MIGRAINES). 15 tablet 0   flecainide (TAMBOCOR) 50 MG tablet Take 1 tablet (50 mg total) by mouth 2 (two) times daily. 180 tablet 2   fluticasone (FLONASE) 50 MCG/ACT nasal spray SPRAY 2 SPRAYS INTO EACH NOSTRIL EVERY DAY (Patient not taking: Reported on 10/30/2021) 48 mL 0   gabapentin (NEURONTIN) 100 MG capsule Take 1 capsule (100 mg total) by mouth 3 (three) times daily as needed. Use in addition to '300mg'$  dose 90 capsule 1    gabapentin (NEURONTIN) 300 MG capsule TAKE 1 CAPSULE BY MOUTH TWICE A DAY 60 capsule 1   KLOR-CON M10 10 MEQ tablet TAKE 1 TABLET BY MOUTH TWICE A DAY 180 tablet 1   levocetirizine (XYZAL) 5 MG tablet TAKE 1 TABLET BY MOUTH EVERY DAY IN THE EVENING 30 tablet 0   levothyroxine (SYNTHROID) 112 MCG tablet TAKE 1 TABLET BY MOUTH EVERY DAY BEFORE BREAKFAST 30 tablet 0   LORazepam (ATIVAN) 0.5 MG tablet Take 0.5 mg by mouth every 8 (eight) hours as needed.     losartan (COZAAR) 50 MG tablet Take 1 tablet (50 mg total) by mouth daily. 90 tablet 3   rizatriptan (MAXALT-MLT) 10 MG disintegrating tablet Take 1 tablet (10 mg total) by mouth as needed for migraine. May repeat in 2 hours if needed 10 tablet 1   SUMAtriptan (IMITREX) 25 MG tablet TAKE 1 TABLET (25 MG TOTAL) BY MOUTH EVERY 2 (TWO) HOURS AS NEEDED FOR MIGRAINE. MAY REPEAT IN 2 HOURS IF HEADACHE PERSISTS OR RECURS. 10 tablet 0   tamoxifen (NOLVADEX) 20 MG tablet TAKE 1 TABLET BY MOUTH EVERY DAY 90 tablet 1   traMADol (ULTRAM) 50 MG tablet Take 1 tablet (50 mg total) by mouth every 12 (twelve) hours as needed. 20 tablet 0   valACYclovir (VALTREX) 500 MG tablet TAKE 1 TABLET (500 MG TOTAL)  BY MOUTH DAILY AS NEEDED (HERPES SIMPLEX). 90 tablet 0   venlafaxine XR (EFFEXOR-XR) 75 MG 24 hr capsule TAKE 1 CAPSULE BY MOUTH DAILY WITH BREAKFAST. 90 capsule 1   No current facility-administered medications for this visit.    Allergies  Allergen Reactions   Ciprofloxacin     anxiety   Keflex [Cephalexin] Hives   Trazodone And Nefazodone Other (See Comments)    nightmares    Family History  Problem Relation Age of Onset   Depression Mother    Breast cancer Mother 61   Hyperlipidemia Father    Prostate cancer Father 7    Social History   Socioeconomic History   Marital status: Single    Spouse name: Not on file   Number of children: 1   Years of education: Not on file   Highest education level: Not on file  Occupational History   Not on  file  Tobacco Use   Smoking status: Never   Smokeless tobacco: Never  Vaping Use   Vaping Use: Never used  Substance and Sexual Activity   Alcohol use: Yes    Comment: occasional   Drug use: No   Sexual activity: Yes    Partners: Male    Birth control/protection: Pill  Other Topics Concern   Not on file  Social History Narrative   Not on file   Social Determinants of Health   Financial Resource Strain: Not on file  Food Insecurity: Not on file  Transportation Needs: Not on file  Physical Activity: Not on file  Stress: Not on file  Social Connections: Not on file  Intimate Partner Violence: Not At Risk (01/02/2021)   Humiliation, Afraid, Rape, and Kick questionnaire    Fear of Current or Ex-Partner: No    Emotionally Abused: No    Physically Abused: No    Sexually Abused: No     Constitutional: Denies fever, malaise, fatigue, headache or abrupt weight changes.  HEENT: Denies eye pain, eye redness, ear pain, ringing in the ears, wax buildup, runny nose, nasal congestion, bloody nose, or sore throat. Respiratory: Denies difficulty breathing, shortness of breath, cough or sputum production.   Cardiovascular: Denies chest pain, chest tightness, palpitations or swelling in the hands or feet.  Gastrointestinal: Denies abdominal pain, bloating, constipation, diarrhea or blood in the stool.  GU: Denies urgency, frequency, pain with urination, burning sensation, blood in urine, odor or discharge. Musculoskeletal: Denies decrease in range of motion, difficulty with gait, muscle pain or joint pain and swelling.  Skin: Denies redness, rashes, lesions or ulcercations.  Neurological: Denies dizziness, difficulty with memory, difficulty with speech or problems with balance and coordination.  Psych: Patient has a history of anxiety.  Denies depression, SI/HI.  No other specific complaints in a complete review of systems (except as listed in HPI above).  Objective:   Physical Exam  BP  136/86 (BP Location: Right Arm, Patient Position: Sitting, Cuff Size: Normal)   Pulse 88   Temp (!) 96.5 F (35.8 C) (Temporal)   Ht '5\' 5"'$  (1.651 m)   Wt 207 lb (93.9 kg)   SpO2 99%   BMI 34.45 kg/m   Wt Readings from Last 3 Encounters:  03/18/22 205 lb 12.8 oz (93.4 kg)  12/19/21 209 lb 14.4 oz (95.2 kg)  10/30/21 207 lb (93.9 kg)    General: Appears her stated age, obese, up in NAD. Skin: Warm, dry and intact.  Hassie Bruce noted of right knee. HEENT: Head: normal shape and size; Eyes:  sclera white, no icterus, conjunctiva pink, PERRLA and EOMs intact;  Neck:  Neck supple, trachea midline. No masses, lumps or thyromegaly present.  Cardiovascular: Normal rate and rhythm. S1,S2 noted.  No murmur, rubs or gallops noted. No JVD or BLE edema.  Pulmonary/Chest: Normal effort and positive vesicular breath sounds. No respiratory distress. No wheezes, rales or ronchi noted.  Abdomen: Normal bowel sounds. Musculoskeletal: Strength 5/5 BUE/BLE.  No difficulty with gait.  Neurological: Alert and oriented. Cranial nerves II-XII grossly intact. Coordination normal.  Psychiatric: Mood and affect normal. Behavior is normal. Judgment and thought content normal.    BMET    Component Value Date/Time   NA 136 12/19/2021 1245   NA 143 01/26/2020 1600   K 3.2 (L) 12/19/2021 1245   CL 100 12/19/2021 1245   CO2 29 12/19/2021 1245   GLUCOSE 101 (H) 12/19/2021 1245   BUN 6 12/19/2021 1245   BUN 11 01/26/2020 1600   CREATININE 0.93 12/19/2021 1245   CREATININE 1.46 (H) 07/18/2021 1608   CALCIUM 9.1 12/19/2021 1245   GFRNONAA >60 12/19/2021 1245   GFRAA >60 02/25/2020 2231    Lipid Panel     Component Value Date/Time   CHOL 194 06/23/2021 1554   TRIG 174 (H) 06/23/2021 1554   HDL 40 (L) 06/23/2021 1554   CHOLHDL 4.9 06/23/2021 1554   VLDL 18.8 12/21/2019 1204   LDLCALC 125 (H) 06/23/2021 1554    CBC    Component Value Date/Time   WBC 4.6 12/19/2021 1245   WBC 4.5 10/17/2021 0842   RBC  4.25 12/19/2021 1245   HGB 12.9 12/19/2021 1245   HGB 10.1 (L) 04/25/2020 1154   HCT 37.8 12/19/2021 1245   HCT 31.6 (L) 04/25/2020 1154   PLT 189 12/19/2021 1245   PLT 261 04/25/2020 1154   MCV 88.9 12/19/2021 1245   MCV 80 04/25/2020 1154   MCH 30.4 12/19/2021 1245   MCHC 34.1 12/19/2021 1245   RDW 13.5 12/19/2021 1245   RDW 16.8 (H) 04/25/2020 1154   LYMPHSABS 1.2 12/19/2021 1245   LYMPHSABS 1.4 04/25/2020 1154   MONOABS 0.4 12/19/2021 1245   EOSABS 0.0 12/19/2021 1245   EOSABS 0.0 04/25/2020 1154   BASOSABS 0.1 12/19/2021 1245   BASOSABS 0.1 04/25/2020 1154    Hgb A1C Lab Results  Component Value Date   HGBA1C 5.4 03/27/2021           Assessment & Plan:   Preventative Health Maintenance:  Encouraged her to get a flu shot the fall Tetanus UTD Encouraged her to get her COVID booster Pap smear UTD, will request copy Mammogram UTD Cologuard ordered Encouraged her to consume a balanced diet and exercise regimen Advised her to see an eye doctor and dentist annually Will check CBC, CMET, TSH, Free T4, Lipid and A1C today  RTC in 6 months follow up chronic conditions Julie Silversmith, NP

## 2022-04-15 NOTE — Patient Instructions (Signed)

## 2022-04-15 NOTE — Assessment & Plan Note (Signed)
Encourage diet and exercise for weight loss 

## 2022-04-16 LAB — COMPLETE METABOLIC PANEL WITH GFR
AG Ratio: 1.5 (calc) (ref 1.0–2.5)
ALT: 11 U/L (ref 6–29)
AST: 16 U/L (ref 10–35)
Albumin: 4 g/dL (ref 3.6–5.1)
Alkaline phosphatase (APISO): 109 U/L (ref 31–125)
BUN: 11 mg/dL (ref 7–25)
CO2: 27 mmol/L (ref 20–32)
Calcium: 9 mg/dL (ref 8.6–10.2)
Chloride: 102 mmol/L (ref 98–110)
Creat: 0.88 mg/dL (ref 0.50–0.99)
Globulin: 2.6 g/dL (calc) (ref 1.9–3.7)
Glucose, Bld: 122 mg/dL (ref 65–139)
Potassium: 3.8 mmol/L (ref 3.5–5.3)
Sodium: 139 mmol/L (ref 135–146)
Total Bilirubin: 0.4 mg/dL (ref 0.2–1.2)
Total Protein: 6.6 g/dL (ref 6.1–8.1)
eGFR: 81 mL/min/{1.73_m2} (ref >=60)

## 2022-04-16 LAB — DM TEMPLATE

## 2022-04-16 LAB — DRUG MONITORING, PANEL 8 WITH CONFIRMATION, URINE
6 Acetylmorphine: NEGATIVE ng/mL (ref <10)
Alcohol Metabolites: NEGATIVE ng/mL (ref <500)
Amphetamines: NEGATIVE ng/mL (ref <500)
Benzodiazepines: NEGATIVE ng/mL (ref <100)
Buprenorphine, Urine: NEGATIVE ng/mL (ref <5)
Cocaine Metabolite: NEGATIVE ng/mL (ref <150)
Creatinine: 153 mg/dL (ref >=20.0)
MDMA: NEGATIVE ng/mL (ref <500)
Marijuana Metabolite: NEGATIVE ng/mL (ref <20)
Opiates: NEGATIVE ng/mL (ref <100)
Oxidant: NEGATIVE ug/mL (ref <200)
Oxycodone: NEGATIVE ng/mL (ref <100)
pH: 6.4 (ref 4.5–9.0)

## 2022-04-16 LAB — HEMOGLOBIN A1C
Hgb A1c MFr Bld: 5.2 % of total Hgb (ref <5.7)
Mean Plasma Glucose: 103 mg/dL
eAG (mmol/L): 5.7 mmol/L

## 2022-04-16 LAB — CBC
HCT: 38.7 % (ref 35.0–45.0)
Hemoglobin: 13.1 g/dL (ref 11.7–15.5)
MCH: 30.5 pg (ref 27.0–33.0)
MCHC: 33.9 g/dL (ref 32.0–36.0)
MCV: 90 fL (ref 80.0–100.0)
MPV: 8.9 fL (ref 7.5–12.5)
Platelets: 186 10*3/uL (ref 140–400)
RBC: 4.3 10*6/uL (ref 3.80–5.10)
RDW: 15.1 % — ABNORMAL HIGH (ref 11.0–15.0)
WBC: 3.7 10*3/uL — ABNORMAL LOW (ref 3.8–10.8)

## 2022-04-16 LAB — LIPID PANEL
Cholesterol: 263 mg/dL — ABNORMAL HIGH (ref <200)
HDL: 59 mg/dL (ref >=50)
LDL Cholesterol (Calc): 174 mg/dL (calc) — ABNORMAL HIGH
Non-HDL Cholesterol (Calc): 204 mg/dL (calc) — ABNORMAL HIGH (ref <130)
Total CHOL/HDL Ratio: 4.5 (calc) (ref <5.0)
Triglycerides: 158 mg/dL — ABNORMAL HIGH (ref <150)

## 2022-04-16 LAB — T4, FREE: Free T4: 1.2 ng/dL (ref 0.8–1.8)

## 2022-04-16 LAB — TSH: TSH: 0.37 mIU/L — ABNORMAL LOW

## 2022-04-17 MED ORDER — LEVOTHYROXINE SODIUM 100 MCG PO TABS: 100.0000 ug | ORAL_TABLET | Freq: Every day | ORAL | 0 refills | Status: DC

## 2022-04-17 NOTE — Addendum Note (Signed)
Addended by: Jearld Fenton on: 04/17/2022 11:51 AM   Modules accepted: Orders

## 2022-05-08 ENCOUNTER — Other Ambulatory Visit: Payer: Self-pay | Admitting: Internal Medicine

## 2022-05-08 NOTE — Telephone Encounter (Signed)
Requested Prescriptions  Pending Prescriptions Disp Refills  . valACYclovir (VALTREX) 500 MG tablet [Pharmacy Med Name: VALACYCLOVIR HCL 500 MG TABLET] 90 tablet 0    Sig: TAKE 1 TABLET (500 MG TOTAL) BY MOUTH DAILY AS NEEDED (HERPES SIMPLEX).     Antimicrobials:  Antiviral Agents - Anti-Herpetic Passed - 05/08/2022  2:15 AM      Passed - Valid encounter within last 12 months    Recent Outpatient Visits          3 weeks ago Screen for colon cancer   [facility] [place], Coralie Keens, NP   9 months ago Other fatigue   [facility] [place], Coralie Keens, [place]   11 months ago [facility] [place], Coralie Keens, NP   1 year ago Acute non-recurrent frontal sinusitis   [facility] [place], Coralie Keens, NP   1 year ago Encounter for general adult medical examination with abnormal findings   [facility] [place], Coralie Keens, NP

## 2022-05-25 ENCOUNTER — Other Ambulatory Visit: Payer: Self-pay | Admitting: Nurse Practitioner

## 2022-06-05 ENCOUNTER — Ambulatory Visit: Payer: BC Managed Care – PPO | Admitting: Internal Medicine

## 2022-06-05 ENCOUNTER — Encounter: Payer: Self-pay | Admitting: Internal Medicine

## 2022-06-05 VITALS — BP 132/78 | HR 80 | Temp 97.1°F | Wt 214.0 lb

## 2022-06-05 DIAGNOSIS — R519 Headache, unspecified: Secondary | ICD-10-CM

## 2022-06-05 DIAGNOSIS — Z77120 Contact with and (suspected) exposure to mold (toxic): Secondary | ICD-10-CM | POA: Diagnosis not present

## 2022-06-05 DIAGNOSIS — R490 Dysphonia: Secondary | ICD-10-CM | POA: Diagnosis not present

## 2022-06-05 MED ORDER — METHYLPREDNISOLONE ACETATE 80 MG/ML IJ SUSP
80.0000 mg | Freq: Once | INTRAMUSCULAR | Status: AC
  Administered 2022-06-05: 80 mg via INTRAMUSCULAR

## 2022-06-05 NOTE — Patient Instructions (Signed)
Hoarseness  Hoarseness, also called dysphonia, is any abnormal change in your voice that can make it difficult to speak. Your voice may sound raspy, breathy, or strained. Hoarseness is caused by a problem with your vocal cords (vocal folds). These are two bands of tissue inside your voice box (larynx). When you speak, your vocal cords move back and forth to create sound. The surfaces of your vocal cords need to be smooth for your voice to sound clear. Swelling or lumps on your vocal cords can cause hoarseness. Vocal cord problems may be the result of injuries or abnormal growths, certain diseases, upper respiratory infection, or allergies. Other causes may include medicine side effects and exposure to irritants. Follow these instructions at home:  Pay attention to any changes in your symptoms. Take these actions to stay safe and to help relieve your symptoms: Lifestyle Do not eat foods that give you heartburn, such as spicy or acidic foods like hot peppers and orange juice. These foods can cause a gastroesophageal reflux that may worsen your vocal cord problems. Limit how much alcohol and caffeine you drink as told by your health care provider. Drink enough fluid to keep your urine pale yellow. Do not use any products that contain nicotine or tobacco. These products include cigarettes, chewing tobacco, and vaping devices, such as e-cigarettes. If you need help quitting, ask your health care provider. Avoid secondhand smoke. General instructions Use a humidifier if the air in your home is dry. Avoid coughing or clearing your throat. Do not whisper. Whispering can cause muscle strain. Do not speak in a loud or harsh voice. Rest your voice. If recommended by your health care provider, schedule an appointment with a speech-language specialist. This specialist may give you methods to try that can help you avoid misusing your voice. Contact a health care provider if: Your voice is hoarse longer than  2 weeks. You almost lose or completely lose your voice for more than 3 days. You have pain when you swallow or try to talk. You feel a lump in your neck. Get help right away if: You have trouble swallowing. You feel like you are choking when you swallow. You cough up blood or vomit blood. You have trouble breathing. You choke, cannot swallow, or cannot breathe if you lie flat. You notice swelling or a rash on your body, face, or tongue. These symptoms may represent a serious problem that is an emergency. Do not wait to see if the symptoms will go away. Get medical help right away. Call your local emergency services (911 in the [place] Do not drive yourself to the hospital. Summary Hoarseness, also called dysphonia, is any abnormal change in your voice that can make it difficult to speak. Your voice may sound raspy, breathy, or strained. Hoarseness is caused by a problem with your vocal cords (vocal folds). Do not speak in a loud or harsh voice, whisper, use nicotine or tobacco products, or eat foods that give you heartburn. See your health care provider if your hoarseness does not improve after 2 weeks. This information is not intended to replace advice given to you by your health care provider. Make sure you discuss any questions you have with your health care provider. Document Revised: 02/11/2021 Document Reviewed: 02/11/2021 Elsevier Patient Education  [place].

## 2022-06-05 NOTE — Progress Notes (Signed)
Subjective:    Patient ID: [name], female    DOB: 1971/05/04, 51 y.o.   MRN: [id number]  HPI  Patient presents to clinic today with complaint of headache and hoarseness.  She reports this started 2 weeks ago. The headache is located on the top of her head and in her forehead. She describes the pain as throbbing. She has some sensitivity to light, but denies sound or dizziness. She denies runny nose, nasal congestion, ear pain, sore throat, cough, shortness of breath. She denies fever, chills or body aches. She denies reflux symptoms. She has tried cough drop, Alka seltzer with minimal relief of symptoms. She has had mold exposure and would like to be tested for this.  Review of Systems     Past Medical History:  Diagnosis Date   Allergy    Anemia    present for many years, mild, stable   Anxiety    Breast cancer ([place])    Breast cancer ([place]) 12/18/2020   Dysrhythmia    PAC's on flecanide (did not tolerate beta blockers)   Family history of breast cancer 01/20/2021   Family history of prostate cancer 01/20/2021   Frequent headaches    HSV infection    fever blisters   Hypertension    Hypothyroidism    Personal history of radiation therapy     Current Outpatient Medications  Medication Sig Dispense Refill   ALPRAZolam (XANAX) 0.25 MG tablet Take 1 tablet (0.25 mg total) by mouth 2 (two) times daily as needed. 20 tablet 0   atorvastatin (LIPITOR) 10 MG tablet TAKE 1 TABLET BY MOUTH EVERY DAY 90 tablet 1   butalbital-acetaminophen-caffeine (FIORICET) 50-325-40 MG tablet Take 1 tablet by mouth every 6 (six) hours as needed for headache. 20 tablet 0   cyclobenzaprine (FLEXERIL) 10 MG tablet TAKE 1 TABLET (10 MG TOTAL) BY MOUTH DAILY AS NEEDED (MIGRAINES). 15 tablet 0   flecainide (TAMBOCOR) 50 MG tablet Take 1 tablet (50 mg total) by mouth 2 (two) times daily. 180 tablet 2   fluticasone (FLONASE) 50 MCG/ACT nasal spray SPRAY 2 SPRAYS INTO EACH NOSTRIL EVERY DAY (Patient  not taking: Reported on 10/30/2021) 48 mL 0   gabapentin (NEURONTIN) 100 MG capsule Take 1 capsule (100 mg total) by mouth 3 (three) times daily as needed. Use in addition to '300mg'$  dose 90 capsule 1   gabapentin (NEURONTIN) 300 MG capsule TAKE 1 CAPSULE BY MOUTH TWICE A DAY 60 capsule 1   KLOR-CON M10 10 MEQ tablet TAKE 1 TABLET BY MOUTH TWICE A DAY 180 tablet 1   levocetirizine (XYZAL) 5 MG tablet TAKE 1 TABLET BY MOUTH EVERY DAY IN THE EVENING 30 tablet 0   levothyroxine (SYNTHROID) 100 MCG tablet Take 1 tablet (100 mcg total) by mouth daily. 90 tablet 0   LORazepam (ATIVAN) 0.5 MG tablet Take 0.5 mg by mouth every 8 (eight) hours as needed.     losartan (COZAAR) 50 MG tablet Take 1 tablet (50 mg total) by mouth daily. 90 tablet 3   rizatriptan (MAXALT-MLT) 10 MG disintegrating tablet Take 1 tablet (10 mg total) by mouth as needed for migraine. May repeat in 2 hours if needed 10 tablet 1   SUMAtriptan (IMITREX) 25 MG tablet TAKE 1 TABLET (25 MG TOTAL) BY MOUTH EVERY 2 (TWO) HOURS AS NEEDED FOR MIGRAINE. MAY REPEAT IN 2 HOURS IF HEADACHE PERSISTS OR RECURS. 10 tablet 0   tamoxifen (NOLVADEX) 20 MG tablet TAKE 1 TABLET BY MOUTH EVERY DAY  90 tablet 1   traMADol (ULTRAM) 50 MG tablet Take 1 tablet (50 mg total) by mouth every 12 (twelve) hours as needed. 20 tablet 0   valACYclovir (VALTREX) 500 MG tablet TAKE 1 TABLET (500 MG TOTAL) BY MOUTH DAILY AS NEEDED (HERPES SIMPLEX). 90 tablet 0   venlafaxine XR (EFFEXOR-XR) 75 MG 24 hr capsule TAKE 1 CAPSULE BY MOUTH DAILY WITH BREAKFAST. 90 capsule 1   No current facility-administered medications for this visit.    Allergies  Allergen Reactions   Ciprofloxacin     anxiety   Keflex [Cephalexin] Hives   Trazodone And Nefazodone Other (See Comments)    nightmares    Family History  Problem Relation Age of Onset   Depression Mother    Breast cancer Mother 55   Hyperlipidemia Father    Prostate cancer Father 80    Social History   Socioeconomic  History   Marital status: Single    Spouse name: Not on file   Number of children: 1   Years of education: Not on file   Highest education level: Not on file  Occupational History   Not on file  Tobacco Use   Smoking status: Never   Smokeless tobacco: Never  Vaping Use   Vaping Use: Never used  Substance and Sexual Activity   Alcohol use: Yes    Comment: occasional   Drug use: No   Sexual activity: Yes    Partners: Male    Birth control/protection: Pill  Other Topics Concern   Not on file  Social History Narrative   Not on file   Social Determinants of Health   Financial Resource Strain: Not on file  Food Insecurity: Not on file  Transportation Needs: Not on file  Physical Activity: Not on file  Stress: Not on file  Social Connections: Not on file  Intimate Partner Violence: Not At Risk (01/02/2021)   Humiliation, Afraid, Rape, and Kick questionnaire    Fear of Current or Ex-Partner: No    Emotionally Abused: No    Physically Abused: No    Sexually Abused: No     Constitutional: Pt reports headache. Denies fever, malaise, fatigue, or abrupt weight changes.  HEENT: Patient reports hoarseness.  Denies eye pain, eye redness, ear pain, ringing in the ears, wax buildup, runny nose, nasal congestion, bloody nose, or sore throat. Respiratory: Denies difficulty breathing, shortness of breath, cough or sputum production.   Cardiovascular: Denies chest pain, chest tightness, palpitations or swelling in the hands or feet.  Gastrointestinal: Denies abdominal pain, bloating, constipation, diarrhea or blood in the stool.  Neurological: Denies dizziness, difficulty with memory, difficulty with speech or problems with balance and coordination.    No other specific complaints in a complete review of systems (except as listed in HPI above).  Objective:   Physical Exam  BP 132/78 (BP Location: Right Arm, Patient Position: Sitting, Cuff Size: Normal)   Pulse 80   Temp (!) 97.1 F  (36.2 C) (Temporal)   Wt 214 lb (97.1 kg)   SpO2 98%   BMI 35.61 kg/m   Wt Readings from Last 3 Encounters:  04/15/22 207 lb (93.9 kg)  03/18/22 205 lb 12.8 oz (93.4 kg)  12/19/21 209 lb 14.4 oz (95.2 kg)    General: Appears her stated age, obese, in NAD. HEENT: Head: normal shape and size, no sinus tenderness noted; Eyes: sclera white, no icterus, conjunctiva pink, PERRLA and EOMs intact Throat/Mouth: Teeth present, mucosa erythematous and moist, + PND, no  exudate, lesions or ulcerations noted.  Neck:  No adenopathy noted. Cardiovascular: Normal rate and rhythm. S1,S2 noted.  No murmur, rubs or gallops noted.  Pulmonary/Chest: Normal effort and positive vesicular breath sounds. No respiratory distress. No wheezes, rales or ronchi noted.  Neurological: Alert and oriented.   BMET    Component Value Date/Time   NA 139 04/15/2022 1113   NA 143 01/26/2020 1600   K 3.8 04/15/2022 1113   CL 102 04/15/2022 1113   CO2 27 04/15/2022 1113   GLUCOSE 122 04/15/2022 1113   BUN 11 04/15/2022 1113   BUN 11 01/26/2020 1600   CREATININE 0.88 04/15/2022 1113   CALCIUM 9.0 04/15/2022 1113   GFRNONAA >60 12/19/2021 1245   GFRAA >60 02/25/2020 2231    Lipid Panel     Component Value Date/Time   CHOL 263 (H) 04/15/2022 1113   TRIG 158 (H) 04/15/2022 1113   HDL 59 04/15/2022 1113   CHOLHDL 4.5 04/15/2022 1113   VLDL 18.8 12/21/2019 1204   LDLCALC 174 (H) 04/15/2022 1113    CBC    Component Value Date/Time   WBC 3.7 (L) 04/15/2022 1113   RBC 4.30 04/15/2022 1113   HGB 13.1 04/15/2022 1113   HGB 12.9 12/19/2021 1245   HGB 10.1 (L) 04/25/2020 1154   HCT 38.7 04/15/2022 1113   HCT 31.6 (L) 04/25/2020 1154   PLT 186 04/15/2022 1113   PLT 189 12/19/2021 1245   PLT 261 04/25/2020 1154   MCV 90.0 04/15/2022 1113   MCV 80 04/25/2020 1154   MCH 30.5 04/15/2022 1113   MCHC 33.9 04/15/2022 1113   RDW 15.1 (H) 04/15/2022 1113   RDW 16.8 (H) 04/25/2020 1154   LYMPHSABS 1.2 12/19/2021  1245   LYMPHSABS 1.4 04/25/2020 1154   MONOABS 0.4 12/19/2021 1245   EOSABS 0.0 12/19/2021 1245   EOSABS 0.0 04/25/2020 1154   BASOSABS 0.1 12/19/2021 1245   BASOSABS 0.1 04/25/2020 1154    Hgb A1C Lab Results  Component Value Date   HGBA1C 5.2 04/15/2022           Assessment & Plan:  Acute Headache, Hoarseness, Exposure to Mold:  80 mg Depo-Medrol IM today Could take antihistamine OTC We will test mold allergy profile  RTC in 5 months, follow-up chronic conditions Webb Silversmith, NP

## 2022-06-08 LAB — ALLERGY PANEL 11, MOLD GROUP
Allergen, A. alternata, m6: <0.1 kU/L
Allergen, Mucor Racemosus, M4: <0.1 kU/L
Aspergillus fumigatus, m3: <0.1 kU/L
CLADOSPORIUM HERBARUM (M2) IGE: <0.1 kU/L
CLASS: 0
CLASS: 0
Candida Albicans: <0.1 kU/L
Class: 0
Class: 0
Class: 0

## 2022-06-08 LAB — INTERPRETATION:

## 2022-06-16 ENCOUNTER — Other Ambulatory Visit: Payer: Self-pay | Admitting: Internal Medicine

## 2022-06-16 NOTE — Telephone Encounter (Signed)
Requested Prescriptions  Pending Prescriptions Disp Refills  . levothyroxine (SYNTHROID) 112 MCG tablet [Pharmacy Med Name: LEVOTHYROXINE 112 MCG TABLET] 90 tablet 0    Sig: TAKE 1 TABLET BY MOUTH EVERY DAY BEFORE BREAKFAST     Endocrinology:  Hypothyroid Agents Failed - 06/16/2022  2:27 AM      Failed - TSH in normal range and within 360 days    TSH  Date Value Ref Range Status  04/15/2022 0.37 (L) mIU/L Final    Comment:              Reference Range .           > or = 20 Years  0.40-4.50 .                Pregnancy Ranges           First trimester    0.26-2.66           Second trimester   0.55-2.73           Third trimester    0.43-2.91          Passed - Valid encounter within last 12 months    Recent Outpatient Visits          1 week ago [facility] [place], Coralie Keens, NP   2 months ago Screen for colon cancer   [facility] [place], Coralie Keens, NP   11 months ago Other fatigue   [facility] [place], Coralie Keens, NP   1 year ago [facility] [place], Coralie Keens, NP   1 year ago Acute non-recurrent frontal sinusitis   [facility] [place], Coralie Keens, [place]

## 2022-06-25 ENCOUNTER — Encounter: Payer: Self-pay | Admitting: Internal Medicine

## 2022-06-25 ENCOUNTER — Ambulatory Visit (INDEPENDENT_AMBULATORY_CARE_PROVIDER_SITE_OTHER): Payer: BC Managed Care – PPO | Admitting: Internal Medicine

## 2022-06-25 ENCOUNTER — Other Ambulatory Visit: Payer: Self-pay | Admitting: Internal Medicine

## 2022-06-25 VITALS — BP 138/76 | HR 88 | Temp 96.9°F | Wt 215.0 lb

## 2022-06-25 DIAGNOSIS — Z0001 Encounter for general adult medical examination with abnormal findings: Secondary | ICD-10-CM | POA: Diagnosis not present

## 2022-06-25 DIAGNOSIS — R490 Dysphonia: Secondary | ICD-10-CM | POA: Diagnosis not present

## 2022-06-25 DIAGNOSIS — L237 Allergic contact dermatitis due to plants, except food: Secondary | ICD-10-CM

## 2022-06-25 DIAGNOSIS — Z6835 Body mass index (BMI) 35.0-35.9, adult: Secondary | ICD-10-CM | POA: Diagnosis not present

## 2022-06-25 DIAGNOSIS — K219 Gastro-esophageal reflux disease without esophagitis: Secondary | ICD-10-CM

## 2022-06-25 MED ORDER — OMEPRAZOLE 20 MG PO CPDR: 20.0000 mg | DELAYED_RELEASE_CAPSULE | Freq: Every day | ORAL | 1 refills | Status: DC

## 2022-06-25 MED ORDER — ALPRAZOLAM 0.25 MG PO TABS: 0.2500 mg | ORAL_TABLET | Freq: Two times a day (BID) | ORAL | 0 refills | Status: DC | PRN

## 2022-06-25 MED ORDER — BUTALBITAL-APAP-CAFFEINE 50-325-40 MG PO TABS: 1.0000 | ORAL_TABLET | Freq: Four times a day (QID) | ORAL | 5 refills | Status: DC | PRN

## 2022-06-25 MED ORDER — DOXYCYCLINE HYCLATE 100 MG PO TABS: 100.0000 mg | ORAL_TABLET | Freq: Two times a day (BID) | ORAL | 0 refills | Status: DC

## 2022-06-25 MED ORDER — CYCLOBENZAPRINE HCL 10 MG PO TABS: 10.0000 mg | ORAL_TABLET | Freq: Every day | ORAL | 5 refills | Status: DC | PRN

## 2022-06-25 NOTE — Progress Notes (Signed)
Subjective:    Patient ID: [name], female    DOB: [birth year], 51 y.o.   MRN: [id number]  HPI  Presents to clinic today for her annual exam.  She reports a rash on her legs and hands that started 1 week ago after being exposed to poison sumac.  She has tried Hydrocortisone and Mupirocin with some relief of symptoms.  Flu: never Tetanus: 07/2016 COVID: [place] x2 Shingrix: Never Pap smear: 03/2022, [facility] Mammogram: 12/2021 Colon screening: never Vision screening: as needed Dentist: biannually  Diet: She does eat meat. She consumes more veggies than fruits. She does eat some fried foods. She drinks mostly tea and water. Exercise: Walking  Review of Systems     Past Medical History:  Diagnosis Date   Allergy    Anemia    present for many years, mild, stable   Anxiety    Breast cancer ([place])    Breast cancer ([place]) 12/18/2020   Dysrhythmia    PAC's on flecanide (did not tolerate beta blockers)   Family history of breast cancer 01/20/2021   Family history of prostate cancer 01/20/2021   Frequent headaches    HSV infection    fever blisters   Hypertension    Hypothyroidism    Personal history of radiation therapy     Current Outpatient Medications  Medication Sig Dispense Refill   ALPRAZolam (XANAX) 0.25 MG tablet Take 1 tablet (0.25 mg total) by mouth 2 (two) times daily as needed. 20 tablet 0   atorvastatin (LIPITOR) 10 MG tablet TAKE 1 TABLET BY MOUTH EVERY DAY 90 tablet 1   butalbital-acetaminophen-caffeine (FIORICET) 50-325-40 MG tablet Take 1 tablet by mouth every 6 (six) hours as needed for headache. 20 tablet 0   cyclobenzaprine (FLEXERIL) 10 MG tablet TAKE 1 TABLET (10 MG TOTAL) BY MOUTH DAILY AS NEEDED (MIGRAINES). 15 tablet 0   flecainide (TAMBOCOR) 50 MG tablet Take 1 tablet (50 mg total) by mouth 2 (two) times daily. 180 tablet 2   fluticasone (FLONASE) 50 MCG/ACT nasal spray SPRAY 2 SPRAYS INTO EACH NOSTRIL EVERY DAY 48 mL 0    gabapentin (NEURONTIN) 100 MG capsule Take 1 capsule (100 mg total) by mouth 3 (three) times daily as needed. Use in addition to '300mg'$  dose 90 capsule 1   gabapentin (NEURONTIN) 300 MG capsule TAKE 1 CAPSULE BY MOUTH TWICE A DAY 60 capsule 1   KLOR-CON M10 10 MEQ tablet TAKE 1 TABLET BY MOUTH TWICE A DAY 180 tablet 1   levocetirizine (XYZAL) 5 MG tablet TAKE 1 TABLET BY MOUTH EVERY DAY IN THE EVENING 30 tablet 0   levothyroxine (SYNTHROID) 100 MCG tablet Take 1 tablet (100 mcg total) by mouth daily. 90 tablet 0   LORazepam (ATIVAN) 0.5 MG tablet Take 0.5 mg by mouth every 8 (eight) hours as needed.     losartan (COZAAR) 50 MG tablet Take 1 tablet (50 mg total) by mouth daily. 90 tablet 3   rizatriptan (MAXALT-MLT) 10 MG disintegrating tablet Take 1 tablet (10 mg total) by mouth as needed for migraine. May repeat in 2 hours if needed 10 tablet 1   SUMAtriptan (IMITREX) 25 MG tablet TAKE 1 TABLET (25 MG TOTAL) BY MOUTH EVERY 2 (TWO) HOURS AS NEEDED FOR MIGRAINE. MAY REPEAT IN 2 HOURS IF HEADACHE PERSISTS OR RECURS. 10 tablet 0   tamoxifen (NOLVADEX) 20 MG tablet TAKE 1 TABLET BY MOUTH EVERY DAY 90 tablet 1   traMADol (ULTRAM) 50 MG tablet Take 1  tablet (50 mg total) by mouth every 12 (twelve) hours as needed. 20 tablet 0   valACYclovir (VALTREX) 500 MG tablet TAKE 1 TABLET (500 MG TOTAL) BY MOUTH DAILY AS NEEDED (HERPES SIMPLEX). 90 tablet 0   venlafaxine XR (EFFEXOR-XR) 75 MG 24 hr capsule TAKE 1 CAPSULE BY MOUTH DAILY WITH BREAKFAST. 90 capsule 1   No current facility-administered medications for this visit.    Allergies  Allergen Reactions   Ciprofloxacin     anxiety   Keflex [Cephalexin] Hives   Trazodone And Nefazodone Other (See Comments)    nightmares    Family History  Problem Relation Age of Onset   Depression Mother    Breast cancer Mother 30   Hyperlipidemia Father    Prostate cancer Father 68    Social History   Socioeconomic History   Marital status: Single    Spouse  name: Not on file   Number of children: 1   Years of education: Not on file   Highest education level: Not on file  Occupational History   Not on file  Tobacco Use   Smoking status: Never   Smokeless tobacco: Never  Vaping Use   Vaping Use: Never used  Substance and Sexual Activity   Alcohol use: Yes    Comment: occasional   Drug use: No   Sexual activity: Yes    Partners: Male    Birth control/protection: Pill  Other Topics Concern   Not on file  Social History Narrative   Not on file   Social Determinants of Health   Financial Resource Strain: Not on file  Food Insecurity: Not on file  Transportation Needs: Not on file  Physical Activity: Not on file  Stress: Not on file  Social Connections: Not on file  Intimate Partner Violence: Not At Risk (01/02/2021)   Humiliation, Afraid, Rape, and Kick questionnaire    Fear of Current or Ex-Partner: No    Emotionally Abused: No    Physically Abused: No    Sexually Abused: No     Constitutional: Patient reports frequent headaches.  Denies fever, malaise, fatigue, or abrupt weight changes.  HEENT: Patient reports persistent hoarseness.  Denies eye pain, eye redness, ear pain, ringing in the ears, wax buildup, runny nose, nasal congestion, bloody nose, or sore throat. Respiratory: Denies difficulty breathing, shortness of breath, cough or sputum production.   Cardiovascular: Denies chest pain, chest tightness, palpitations or swelling in the hands or feet.  Gastrointestinal: Denies abdominal pain, bloating, constipation, diarrhea or blood in the stool.  GU: Denies urgency, frequency, pain with urination, burning sensation, blood in urine, odor or discharge. Musculoskeletal: Denies decrease in range of motion, difficulty with gait, muscle pain or joint pain and swelling.  Skin: Denies redness, rashes, lesions or ulcercations.  Neurological: Denies dizziness, difficulty with memory, difficulty with speech or problems with balance  and coordination.  Psych: Patient has a history of anxiety.  Denies depression, SI/HI.  No other specific complaints in a complete review of systems (except as listed in HPI above).  Objective:   Physical Exam  BP 138/76 (BP Location: Right Arm, Patient Position: Sitting, Cuff Size: Normal)   Pulse 88   Temp (!) 96.9 F (36.1 C) (Temporal)   Wt 215 lb (97.5 kg)   SpO2 99%   BMI 35.78 kg/m   Wt Readings from Last 3 Encounters:  06/05/22 214 lb (97.1 kg)  04/15/22 207 lb (93.9 kg)  03/18/22 205 lb 12.8 oz (93.4 kg)  General: Appears her stated age, obese, in NAD. Skin: Warm, dry and intact.  Scattered, raised, scabbed lesions noted of bilateral hands and right lower extremity. HEENT: Head: normal shape and size; Eyes: sclera white, no icterus, conjunctiva pink, PERRLA and EOMs intact;  Neck:  Neck supple, trachea midline. No masses, lumps or thyromegaly present.  Cardiovascular: Normal rate and rhythm. S1,S2 noted.  No murmur, rubs or gallops noted. No JVD or BLE edema. No carotid bruits noted. Pulmonary/Chest: Normal effort and positive vesicular breath sounds. No respiratory distress. No wheezes, rales or ronchi noted.  Abdomen: Normal bowel sounds.  Musculoskeletal: Strength 5/5 BUE/BLE.  No difficulty with gait.  Neurological: Alert and oriented. Cranial nerves II-XII grossly intact. Coordination normal.  Psychiatric: Mood and affect normal. Behavior is normal. Judgment and thought content normal.    BMET    Component Value Date/Time   NA 139 04/15/2022 1113   NA 143 01/26/2020 1600   K 3.8 04/15/2022 1113   CL 102 04/15/2022 1113   CO2 27 04/15/2022 1113   GLUCOSE 122 04/15/2022 1113   BUN 11 04/15/2022 1113   BUN 11 01/26/2020 1600   CREATININE 0.88 04/15/2022 1113   CALCIUM 9.0 04/15/2022 1113   GFRNONAA >60 12/19/2021 1245   GFRAA >60 02/25/2020 2231    Lipid Panel     Component Value Date/Time   CHOL 263 (H) 04/15/2022 1113   TRIG 158 (H) 04/15/2022  1113   HDL 59 04/15/2022 1113   CHOLHDL 4.5 04/15/2022 1113   VLDL 18.8 12/21/2019 1204   LDLCALC 174 (H) 04/15/2022 1113    CBC    Component Value Date/Time   WBC 3.7 (L) 04/15/2022 1113   RBC 4.30 04/15/2022 1113   HGB 13.1 04/15/2022 1113   HGB 12.9 12/19/2021 1245   HGB 10.1 (L) 04/25/2020 1154   HCT 38.7 04/15/2022 1113   HCT 31.6 (L) 04/25/2020 1154   PLT 186 04/15/2022 1113   PLT 189 12/19/2021 1245   PLT 261 04/25/2020 1154   MCV 90.0 04/15/2022 1113   MCV 80 04/25/2020 1154   MCH 30.5 04/15/2022 1113   MCHC 33.9 04/15/2022 1113   RDW 15.1 (H) 04/15/2022 1113   RDW 16.8 (H) 04/25/2020 1154   LYMPHSABS 1.2 12/19/2021 1245   LYMPHSABS 1.4 04/25/2020 1154   MONOABS 0.4 12/19/2021 1245   EOSABS 0.0 12/19/2021 1245   EOSABS 0.0 04/25/2020 1154   BASOSABS 0.1 12/19/2021 1245   BASOSABS 0.1 04/25/2020 1154    Hgb A1C Lab Results  Component Value Date   HGBA1C 5.2 04/15/2022           Assessment & Plan:   Preventative Health Maintenance:  She declines flu shot Tetanus UTD Encouraged her to get her COVID booster Discussed Shingrix vaccine, will check coverage with her insurance company schedule a nurse visit if she would like to have this done Pap smear UTD, will request copy Mammogram UTD Cologuard has been ordered, encouraged her to complete this Encouraged her to consume a balanced diet and exercise regimen Advised her to see an eye doctor and dentist annually Recent labs reviewed  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

## 2022-06-25 NOTE — Telephone Encounter (Signed)
Requested Prescriptions  Pending Prescriptions Disp Refills  . levocetirizine (XYZAL) 5 MG tablet [Pharmacy Med Name: LEVOCETIRIZINE 5 MG TABLET] 30 tablet 0    Sig: TAKE 1 TABLET BY MOUTH EVERY DAY IN THE EVENING     Ear, Nose, and Throat:  Antihistamines - levocetirizine dihydrochloride Passed - 06/25/2022 11:03 AM      Passed - Cr in normal range and within 360 days    Creat  Date Value Ref Range Status  04/15/2022 0.88 0.50 - 0.99 mg/dL Final         Passed - eGFR is 10 or above and within 360 days    GFR calc Af Amer  Date Value Ref Range Status  02/25/2020 >60 >60 mL/min Final   GFR, Estimated  Date Value Ref Range Status  12/19/2021 >60 >60 mL/min Final    Comment:    (NOTE) Calculated using the CKD-EPI Creatinine Equation (2021)    GFR  Date Value Ref Range Status  12/21/2019 58.60 (L) >60.00 mL/min Final   eGFR  Date Value Ref Range Status  04/15/2022 81 > OR = 60 mL/min/1.5m Final         Passed - Valid encounter within last 12 months    Recent Outpatient Visits          Today Encounter for general adult medical examination with abnormal findings   [facility]   [facility]   2 months ago Screen for colon cancer   [facility]   11 months ago Other fatigue   [facility]   1 year ago Viral URI   [facility] [place] [facility] [place]

## 2022-06-25 NOTE — Assessment & Plan Note (Signed)
Encourage diet and exercise for weight loss 

## 2022-06-25 NOTE — Patient Instructions (Signed)

## 2022-06-26 ENCOUNTER — Other Ambulatory Visit: Payer: Self-pay

## 2022-06-26 ENCOUNTER — Emergency Department: Payer: BC Managed Care – PPO

## 2022-06-26 DIAGNOSIS — Z5321 Procedure and treatment not carried out due to patient leaving prior to being seen by health care provider: Secondary | ICD-10-CM | POA: Insufficient documentation

## 2022-06-26 DIAGNOSIS — M542 Cervicalgia: Secondary | ICD-10-CM | POA: Diagnosis not present

## 2022-06-26 DIAGNOSIS — R200 Anesthesia of skin: Secondary | ICD-10-CM | POA: Diagnosis not present

## 2022-06-26 DIAGNOSIS — R519 Headache, unspecified: Secondary | ICD-10-CM | POA: Insufficient documentation

## 2022-06-26 LAB — CBC WITH DIFFERENTIAL/PLATELET
Abs Immature Granulocytes: 0.03 10*3/uL (ref 0.00–0.07)
Basophils Absolute: 0.1 10*3/uL (ref 0.0–0.1)
Basophils Relative: 1 %
Eosinophils Absolute: 0 10*3/uL (ref 0.0–0.5)
Eosinophils Relative: 1 %
HCT: 41.7 % (ref 36.0–46.0)
Hemoglobin: 14.4 g/dL (ref 12.0–15.0)
Immature Granulocytes: 1 %
Lymphocytes Relative: 25 %
Lymphs Abs: 1.4 10*3/uL (ref 0.7–4.0)
MCH: 30.3 pg (ref 26.0–34.0)
MCHC: 34.5 g/dL (ref 30.0–36.0)
MCV: 87.8 fL (ref 80.0–100.0)
Monocytes Absolute: 0.4 10*3/uL (ref 0.1–1.0)
Monocytes Relative: 6 %
Neutro Abs: 3.8 10*3/uL (ref 1.7–7.7)
Neutrophils Relative %: 66 %
Platelets: 291 10*3/uL (ref 150–400)
RBC: 4.75 MIL/uL (ref 3.87–5.11)
RDW: 12.6 % (ref 11.5–15.5)
WBC: 5.7 10*3/uL (ref 4.0–10.5)
nRBC: 0 % (ref 0.0–0.2)

## 2022-06-26 LAB — COMPREHENSIVE METABOLIC PANEL
ALT: 11 U/L (ref 0–44)
AST: 25 U/L (ref 15–41)
Albumin: 4.4 g/dL (ref 3.5–5.0)
Alkaline Phosphatase: 127 U/L — ABNORMAL HIGH (ref 38–126)
Anion gap: 14 (ref 5–15)
BUN: 13 mg/dL (ref 6–20)
CO2: 21 mmol/L — ABNORMAL LOW (ref 22–32)
Calcium: 9.5 mg/dL (ref 8.9–10.3)
Chloride: 103 mmol/L (ref 98–111)
Creatinine, Ser: 0.92 mg/dL (ref 0.44–1.00)
GFR, Estimated: >60 mL/min (ref >60)
Glucose, Bld: 139 mg/dL — ABNORMAL HIGH (ref 70–99)
Potassium: 3.2 mmol/L — ABNORMAL LOW (ref 3.5–5.1)
Sodium: 138 mmol/L (ref 135–145)
Total Bilirubin: 1.1 mg/dL (ref 0.3–1.2)
Total Protein: 8.3 g/dL — ABNORMAL HIGH (ref 6.5–8.1)

## 2022-06-26 MED ORDER — OXYCODONE-ACETAMINOPHEN 5-325 MG PO TABS
1.0000 | ORAL_TABLET | Freq: Once | ORAL | Status: AC
  Administered 2022-06-26: 1 via ORAL
  Filled 2022-06-26: qty 1

## 2022-06-26 NOTE — ED Notes (Signed)
First Nurse Note: Pt tearful at the desk - stating she's had severe neck pain for the last 24 hours with tingling in her arms and hands.

## 2022-06-26 NOTE — ED Triage Notes (Signed)
Reports acute onset posterior neck pain without fall or injury. Reports concurrent onset of numbness and tingling in fingers and toes. Reports chronic hx of migraine but states pain does not feel the same and does not typically have associated numbness and tingling. Numbness and tingling is bilateral to hands and feet. Pt alert and oriented following commands appropriately. Breathing unlabored speaking in full sentences. Pt notably anxious and tearful in triage.

## 2022-06-26 NOTE — ED Provider Triage Note (Signed)
Emergency Medicine Provider Triage Evaluation Note  [name] , a 51 y.o. female  was evaluated in triage.  Pt complains of severe headache and neck pain, numbness and tingling in hands and feet, hyperventilating bc of pain, no fever ; sx x 2 days.  Review of Systems  Positive:  Negative:   Physical Exam  BP (!) 146/84   Pulse 80   Temp 97.6 F (36.4 C) (Oral)   Resp (!) 22   Ht '5\' 5"'$  (1.651 m)   Wt 98 kg   SpO2 100%   BMI 35.94 kg/m  Gen:   Awake, no distress   Resp:  Normal effort  MSK:   Moves extremities without difficulty  Other:  Area at posterior cspine tender at base of skull, pt has full rom, grips = b/l  Medical Decision Making  Medically screening exam initiated at 10:54 PM.  Appropriate orders placed.  NEVAE PINNIX was informed that the remainder of the evaluation will be completed by another provider, this initial triage assessment does not replace that evaluation, and the importance of remaining in the ED until their evaluation is complete.     Versie Starks, PA-C 06/26/22 2256

## 2022-06-27 ENCOUNTER — Emergency Department
Admission: EM | Admit: 2022-06-27 | Discharge: 2022-06-27 | Discharge status: Against Medical Advice | Payer: BC Managed Care – PPO | Attending: Emergency Medicine | Admitting: Emergency Medicine

## 2022-06-27 NOTE — ED Notes (Signed)
No answer when called from the lobby 

## 2022-06-29 ENCOUNTER — Telehealth: Payer: Self-pay

## 2022-06-29 NOTE — Telephone Encounter (Signed)
Transition Care Management Unsuccessful Follow-up Telephone Call  Date of discharge and from where:  06/27/22 From [facility] ER .  Patient left AMA.   Attempts:  1st Attempt  Reason for unsuccessful TCM follow-up call:  Left voice message

## 2022-06-30 ENCOUNTER — Encounter: Payer: Self-pay | Admitting: Internal Medicine

## 2022-06-30 NOTE — Telephone Encounter (Signed)
Pls  fu with pt [phone] Trying to make an appt as still have issues and severe light sensitivity. Migraine headache better but is having to wear sunglasses inside, wants an appt with Rollene Fare, none available till Thurs, pls advise.

## 2022-07-01 ENCOUNTER — Ambulatory Visit: Payer: Self-pay | Admitting: *Deleted

## 2022-07-01 NOTE — Telephone Encounter (Signed)
  Chief Complaint: persistent migraine Symptoms: headache is better- residual eye pain and light sensitivity  Frequency: started last Thursday Pertinent Negatives: Patient denies fever, stiff neck, eye pain, sore throat, cold symptoms Disposition: '[]'$ ED /'[]'$ Urgent Care (no appt availability in office) / '[x]'$ Appointment(In office/virtual)/ '[]'$  [place] [facility]/ '[]'$ Home Care/ '[]'$ Refused Recommended Disposition /'[]'$ [place] [facility]/ '[]'$  Follow-up with PCP Additional Notes: Patient states her pain is better- but she is still having light sensitivity

## 2022-07-01 NOTE — Telephone Encounter (Signed)
Summary: migraine   Pt requesting a cb regarding migraines   Please reference 10-24 pt mychart message      Reason for Disposition  [1] MILD-MODERATE headache AND [2] present > 72 hours  Answer Assessment - Initial Assessment Questions 1. LOCATION: "Where does it hurt?"      Light sensitivity, back of head- headache is gone now, residual pain in eyes 2. ONSET: "When did the headache start?" (Minutes, hours or days)      Started Thursday- finally slowed down Saturday night 3. PATTERN: "Does the pain come and go, or has it been constant since it started?"     constant 4. SEVERITY: "How bad is the pain?" and "What does it keep you from doing?"  (e.g., Scale 1-10; mild, moderate, or severe)   - MILD (1-3): doesn't interfere with normal activities    - MODERATE (4-7): interferes with normal activities or awakens from sleep    - SEVERE (8-10): excruciating pain, unable to do any normal activities        Mild/moderate 5. RECURRENT SYMPTOM: "Have you ever had headaches before?" If Yes, ask: "When was the last time?" and "What happened that time?"      Yes- not lasting this long 6. CAUSE: "What do you think is causing the headache?"     migraine 7. MIGRAINE: "Have you been diagnosed with migraine headaches?" If Yes, ask: "Is this headache similar?"      Yes- took TH/FR- didn't help 8. HEAD INJURY: "Has there been any recent injury to the head?"      no 9. OTHER SYMPTOMS: "Do you have any other symptoms?" (fever, stiff neck, eye pain, sore throat, cold symptoms)     Nausea/vomiting, eye pain, neck pain 10. PREGNANCY: "Is there any chance you are pregnant?" "When was your last menstrual period?"  Protocols used: [facility]

## 2022-07-01 NOTE — Telephone Encounter (Signed)
See telephone encounter.   Thanks,   -Mickel Baas

## 2022-07-01 NOTE — Telephone Encounter (Signed)
Pt has apt 07/02/2022.  See previous telephone encounter.   Thanks,   -Mickel Baas

## 2022-07-01 NOTE — Telephone Encounter (Signed)
Advised pt that Rollene Fare is out of the office this afternoon and are first available is tomorrow.   Thanks,   -Mickel Baas

## 2022-07-02 ENCOUNTER — Encounter: Payer: Self-pay | Admitting: Internal Medicine

## 2022-07-02 ENCOUNTER — Ambulatory Visit (INDEPENDENT_AMBULATORY_CARE_PROVIDER_SITE_OTHER): Payer: BC Managed Care – PPO | Admitting: Internal Medicine

## 2022-07-02 VITALS — BP 163/91 | HR 79 | Temp 97.1°F | Wt 216.0 lb

## 2022-07-02 DIAGNOSIS — G43701 Chronic migraine without aura, not intractable, with status migrainosus: Secondary | ICD-10-CM

## 2022-07-02 DIAGNOSIS — I10 Essential (primary) hypertension: Secondary | ICD-10-CM

## 2022-07-02 NOTE — Assessment & Plan Note (Signed)
Elevated today but she feels like this is because she is in so much pain from this migraine Continue losartan, she does not want me to adjust this medication as she follows with cardiology for the same Advised her to follow-up with cardiology soon as possible

## 2022-07-02 NOTE — Patient Instructions (Signed)
Migraine Headache  A migraine headache is a very strong throbbing pain on one side or both sides of your head. This type of headache can also cause other symptoms. It can last from 4 hours to 3 days. Talk with your doctor about what things may bring on (trigger) this condition.  What are the causes?  The exact cause of this condition is not known. This condition may be triggered or caused by:  Drinking alcohol.  Smoking.  Taking medicines, such as:  Medicine used to treat chest pain (nitroglycerin).  Birth control pills.  Estrogen.  Some blood pressure medicines.  Eating or drinking certain products.  Doing physical activity.  Other things that may trigger a migraine headache include:  Having a menstrual period.  Pregnancy.  Hunger.  Stress.  Not getting enough sleep or getting too much sleep.  Weather changes.  Tiredness (fatigue).  What increases the risk?  Being 25-55 years old.  Being female.  Having a family history of migraine headaches.  Being Caucasian.  Having depression or anxiety.  Being very overweight.  What are the signs or symptoms?  A throbbing pain. This pain may:  Happen in any area of the head, such as on one side or both sides.  Make it hard to do daily activities.  Get worse with physical activity.  Get worse around bright lights or loud noises.  Other symptoms may include:  Feeling sick to your stomach (nauseous).  Vomiting.  Dizziness.  Being sensitive to bright lights, loud noises, or smells.  Before you get a migraine headache, you may get warning signs (an aura). An aura may include:  Seeing flashing lights or having blind spots.  Seeing bright spots, halos, or zigzag lines.  Having tunnel vision or blurred vision.  Having numbness or a tingling feeling.  Having trouble talking.  Having weak muscles.  Some people have symptoms after a migraine headache (postdromal phase), such as:  Tiredness.  Trouble thinking (concentrating).  How is this treated?  Taking medicines that:  Relieve  pain.  Relieve the feeling of being sick to your stomach.  Prevent migraine headaches.  Treatment may also include:  Having acupuncture.  Avoiding foods that bring on migraine headaches.  Learning ways to control your body functions (biofeedback).  Therapy to help you know and deal with negative thoughts (cognitive behavioral therapy).  Follow these instructions at home:  Medicines  Take over-the-counter and prescription medicines only as told by your doctor.  Ask your doctor if the medicine prescribed to you:  Requires you to avoid driving or using heavy machinery.  Can cause trouble pooping (constipation). You may need to take these steps to prevent or treat trouble pooping:  Drink enough fluid to keep your pee (urine) pale yellow.  Take over-the-counter or prescription medicines.  Eat foods that are high in fiber. These include beans, whole grains, and fresh fruits and vegetables.  Limit foods that are high in fat and sugar. These include fried or sweet foods.  Lifestyle  Do not drink alcohol.  Do not use any products that contain nicotine or tobacco, such as cigarettes, e-cigarettes, and chewing tobacco. If you need help quitting, ask your doctor.  Get at least 8 hours of sleep every night.  Limit and deal with stress.  General instructions  Keep a journal to find out what may bring on your migraine headaches. For example, write down:  What you eat and drink.  How much sleep you get.  Any change in   what you eat or drink.  Any change in your medicines.  If you have a migraine headache:  Avoid things that make your symptoms worse, such as bright lights.  It may help to lie down in a dark, quiet room.  Do not drive or use heavy machinery.  Ask your doctor what activities are safe for you.  Keep all follow-up visits as told by your doctor. This is important.  Contact a doctor if:  You get a migraine headache that is different or worse than others you have had.  You have more than 15 headache days in one month.  Get  help right away if:  Your migraine headache gets very bad.  Your migraine headache lasts longer than 72 hours.  You have a fever.  You have a stiff neck.  You have trouble seeing.  Your muscles feel weak or like you cannot control them.  You start to lose your balance a lot.  You start to have trouble walking.  You pass out (faint).  You have a seizure.  Summary  A migraine headache is a very strong throbbing pain on one side or both sides of your head. These headaches can also cause other symptoms.  This condition may be treated with medicines and changes to your lifestyle.  Keep a journal to find out what may bring on your migraine headaches.  Contact a doctor if you get a migraine headache that is different or worse than others you have had.  Contact your doctor if you have more than 15 headache days in a month.  This information is not intended to replace advice given to you by your health care provider. Make sure you discuss any questions you have with your health care provider.  Document Revised: 02/05/2022 Document Reviewed: 10/06/2018  Elsevier Patient Education  2023 Elsevier Inc.

## 2022-07-02 NOTE — Progress Notes (Signed)
Subjective:    Patient ID: [name], female    DOB: 07-Feb-1971, 51 y.o.   MRN: [id number]  HPI  Patient presents to clinic today with complaint of a migraine.  This started 1 week ago and lasted 72 hours before it resolved.  She describes the pain as pounding.  She reports associated sensitivity to light, nausea, vomiting. She did have numbness and tingling her hands.  She denies dizziness.  She has taken Maxalt, Imitrex and Fioricet with minimal relief of symptoms.  She did go to the ER 10/21 for the same but left before being seen.  Review of Systems     Past Medical History:  Diagnosis Date   Allergy    Anemia    present for many years, mild, stable   Anxiety    Breast cancer ([place])    Breast cancer ([place]) 12/18/2020   Dysrhythmia    PAC's on flecanide (did not tolerate beta blockers)   Family history of breast cancer 01/20/2021   Family history of prostate cancer 01/20/2021   Frequent headaches    HSV infection    fever blisters   Hypertension    Hypothyroidism    Personal history of radiation therapy     Current Outpatient Medications  Medication Sig Dispense Refill   ALPRAZolam (XANAX) 0.25 MG tablet Take 1 tablet (0.25 mg total) by mouth 2 (two) times daily as needed. 20 tablet 0   atorvastatin (LIPITOR) 10 MG tablet TAKE 1 TABLET BY MOUTH EVERY DAY 90 tablet 1   butalbital-acetaminophen-caffeine (FIORICET) 50-325-40 MG tablet Take 1 tablet by mouth every 6 (six) hours as needed for headache. 20 tablet 5   cyclobenzaprine (FLEXERIL) 10 MG tablet Take 1 tablet (10 mg total) by mouth daily as needed (migraines). 15 tablet 5   doxycycline (VIBRA-TABS) 100 MG tablet Take 1 tablet (100 mg total) by mouth 2 (two) times daily. 20 tablet 0   flecainide (TAMBOCOR) 50 MG tablet Take 1 tablet (50 mg total) by mouth 2 (two) times daily. 180 tablet 2   fluticasone (FLONASE) 50 MCG/ACT nasal spray SPRAY 2 SPRAYS INTO EACH NOSTRIL EVERY DAY 48 mL 0   gabapentin (NEURONTIN)  100 MG capsule Take 1 capsule (100 mg total) by mouth 3 (three) times daily as needed. Use in addition to '300mg'$  dose 90 capsule 1   gabapentin (NEURONTIN) 300 MG capsule TAKE 1 CAPSULE BY MOUTH TWICE A DAY 60 capsule 1   KLOR-CON M10 10 MEQ tablet TAKE 1 TABLET BY MOUTH TWICE A DAY 180 tablet 1   levocetirizine (XYZAL) 5 MG tablet TAKE 1 TABLET BY MOUTH EVERY DAY IN THE EVENING 30 tablet 0   levothyroxine (SYNTHROID) 100 MCG tablet Take 1 tablet (100 mcg total) by mouth daily. 90 tablet 0   LORazepam (ATIVAN) 0.5 MG tablet Take 0.5 mg by mouth every 8 (eight) hours as needed.     losartan (COZAAR) 50 MG tablet Take 1 tablet (50 mg total) by mouth daily. 90 tablet 3   omeprazole (PRILOSEC) 20 MG capsule Take 1 capsule (20 mg total) by mouth daily. 90 capsule 1   rizatriptan (MAXALT-MLT) 10 MG disintegrating tablet Take 1 tablet (10 mg total) by mouth as needed for migraine. May repeat in 2 hours if needed 10 tablet 1   SUMAtriptan (IMITREX) 25 MG tablet TAKE 1 TABLET (25 MG TOTAL) BY MOUTH EVERY 2 (TWO) HOURS AS NEEDED FOR MIGRAINE. MAY REPEAT IN 2 HOURS IF HEADACHE PERSISTS OR RECURS. 10 tablet  0   tamoxifen (NOLVADEX) 20 MG tablet TAKE 1 TABLET BY MOUTH EVERY DAY 90 tablet 1   traMADol (ULTRAM) 50 MG tablet Take 1 tablet (50 mg total) by mouth every 12 (twelve) hours as needed. 20 tablet 0   valACYclovir (VALTREX) 500 MG tablet TAKE 1 TABLET (500 MG TOTAL) BY MOUTH DAILY AS NEEDED (HERPES SIMPLEX). 90 tablet 0   venlafaxine XR (EFFEXOR-XR) 75 MG 24 hr capsule TAKE 1 CAPSULE BY MOUTH DAILY WITH BREAKFAST. 90 capsule 1   No current facility-administered medications for this visit.    Allergies  Allergen Reactions   Ciprofloxacin     anxiety   Keflex [Cephalexin] Hives   Trazodone And Nefazodone Other (See Comments)    nightmares    Family History  Problem Relation Age of Onset   Depression Mother    Breast cancer Mother 46   Hyperlipidemia Father    Prostate cancer Father 40     Social History   Socioeconomic History   Marital status: Single    Spouse name: Not on file   Number of children: 1   Years of education: Not on file   Highest education level: Not on file  Occupational History   Not on file  Tobacco Use   Smoking status: Never   Smokeless tobacco: Never  Vaping Use   Vaping Use: Never used  Substance and Sexual Activity   Alcohol use: Yes    Comment: occasional   Drug use: No   Sexual activity: Yes    Partners: Male    Birth control/protection: Pill  Other Topics Concern   Not on file  Social History Narrative   Not on file   Social Determinants of Health   Financial Resource Strain: Not on file  Food Insecurity: Not on file  Transportation Needs: Not on file  Physical Activity: Not on file  Stress: Not on file  Social Connections: Not on file  Intimate Partner Violence: Not At Risk (01/02/2021)   Humiliation, Afraid, Rape, and Kick questionnaire    Fear of Current or Ex-Partner: No    Emotionally Abused: No    Physically Abused: No    Sexually Abused: No     Constitutional: Patient reports headache.  Denies fever, malaise, fatigue, or abrupt weight changes.  HEENT: Patient reports sensitivity to light.  Denies eye pain, eye redness, ear pain, ringing in the ears, wax buildup, runny nose, nasal congestion, bloody nose, or sore throat. Respiratory: Denies difficulty breathing, shortness of breath, cough or sputum production.   Cardiovascular: Denies chest pain, chest tightness, palpitations or swelling in the hands or feet.  Neurological: Denies dizziness, difficulty with memory, difficulty with speech or problems with balance and coordination.    No other specific complaints in a complete review of systems (except as listed in HPI above).  Objective:   Physical Exam   BP (!) 163/91 (BP Location: Left Arm, Patient Position: Sitting, Cuff Size: Normal)   Pulse 79   Temp (!) 97.1 F (36.2 C) (Temporal)   Wt 216 lb (98  kg)   SpO2 98%   BMI 35.94 kg/m   Wt Readings from Last 3 Encounters:  06/26/22 216 lb (98 kg)  06/25/22 215 lb (97.5 kg)  06/05/22 214 lb (97.1 kg)    General: Appears her stated age, obese, in NAD. Skin: Warm, dry and intact HEENT: Head: normal shape and size; Eyes: sclera white, no icterus, conjunctiva pink, PERRLA and EOMs intact;  Cardiovascular: Normal rate and rhythm.  Pulmonary/Chest: Normal effort and positive vesicular breath sounds. No respiratory distress. No wheezes, rales or ronchi noted.  Neurological: Alert and oriented. Coordination normal.   BMET    Component Value Date/Time   NA 138 06/26/2022 2248   NA 143 01/26/2020 1600   K 3.2 (L) 06/26/2022 2248   CL 103 06/26/2022 2248   CO2 21 (L) 06/26/2022 2248   GLUCOSE 139 (H) 06/26/2022 2248   BUN 13 06/26/2022 2248   BUN 11 01/26/2020 1600   CREATININE 0.92 06/26/2022 2248   CREATININE 0.88 04/15/2022 1113   CALCIUM 9.5 06/26/2022 2248   GFRNONAA >60 06/26/2022 2248   GFRNONAA >60 12/19/2021 1245   GFRAA >60 02/25/2020 2231    Lipid Panel     Component Value Date/Time   CHOL 263 (H) 04/15/2022 1113   TRIG 158 (H) 04/15/2022 1113   HDL 59 04/15/2022 1113   CHOLHDL 4.5 04/15/2022 1113   VLDL 18.8 12/21/2019 1204   LDLCALC 174 (H) 04/15/2022 1113    CBC    Component Value Date/Time   WBC 5.7 06/26/2022 2248   RBC 4.75 06/26/2022 2248   HGB 14.4 06/26/2022 2248   HGB 12.9 12/19/2021 1245   HGB 10.1 (L) 04/25/2020 1154   HCT 41.7 06/26/2022 2248   HCT 31.6 (L) 04/25/2020 1154   PLT 291 06/26/2022 2248   PLT 189 12/19/2021 1245   PLT 261 04/25/2020 1154   MCV 87.8 06/26/2022 2248   MCV 80 04/25/2020 1154   MCH 30.3 06/26/2022 2248   MCHC 34.5 06/26/2022 2248   RDW 12.6 06/26/2022 2248   RDW 16.8 (H) 04/25/2020 1154   LYMPHSABS 1.4 06/26/2022 2248   LYMPHSABS 1.4 04/25/2020 1154   MONOABS 0.4 06/26/2022 2248   EOSABS 0.0 06/26/2022 2248   EOSABS 0.0 04/25/2020 1154   BASOSABS 0.1  06/26/2022 2248   BASOSABS 0.1 04/25/2020 1154    Hgb A1C Lab Results  Component Value Date   HGBA1C 5.2 04/15/2022           Assessment & Plan:   Migraine, Not Intractable with Status Migrainosus:  Encouraged her to push fluids Toradol 30 mg IM x1 Continue Maxalt, Imitrex and Fioricet as needed I wonder if blood pressure is a contributing factor, advised her to follow-up with cardiology regardless  RTC in 6 months, follow-up chronic conditions Webb Silversmith, NP

## 2022-07-06 ENCOUNTER — Other Ambulatory Visit: Payer: BC Managed Care – PPO

## 2022-07-06 ENCOUNTER — Ambulatory Visit: Payer: BC Managed Care – PPO | Admitting: Hematology

## 2022-07-15 ENCOUNTER — Ambulatory Visit: Payer: BC Managed Care – PPO | Admitting: Nurse Practitioner

## 2022-07-15 ENCOUNTER — Other Ambulatory Visit: Payer: Self-pay | Admitting: Internal Medicine

## 2022-07-15 NOTE — Telephone Encounter (Signed)
Requested medication (s) are due for refill today: yes  Requested medication (s) are on the active medication list: yes  Last refill:  04/17/22 #90 0 refills  Future visit scheduled: no  Notes to clinic:  no refills remain, do you want to refill Rx?     Requested Prescriptions  Pending Prescriptions Disp Refills   levothyroxine (SYNTHROID) 100 MCG tablet [Pharmacy Med Name: LEVOTHYROXINE 100 MCG TABLET] 90 tablet 0    Sig: TAKE 1 TABLET BY MOUTH EVERY DAY     Endocrinology:  Hypothyroid Agents Failed - 07/15/2022  3:40 AM      Failed - TSH in normal range and within 360 days    TSH  Date Value Ref Range Status  04/15/2022 0.37 (L) mIU/L Final    Comment:              Reference Range .           > or = 20 Years  0.40-4.50 .                Pregnancy Ranges           First trimester    0.26-2.66           Second trimester   0.55-2.73           Third trimester    0.43-2.91          Passed - Valid encounter within last 12 months    Recent Outpatient Visits           1 week ago Chronic migraine without aura with status migrainosus, not intractable   [facility] [place], Coralie Keens, NP   2 weeks ago Encounter for general adult medical examination with abnormal findings   [facility], Coralie Keens, NP   1 month ago [facility] [place], Coralie Keens, NP   3 months ago Screen for colon cancer   [place], NP   12 months ago Other fatigue   [facility] [place], Coralie Keens, NP       Future Appointments             In 2 days Sharolyn Douglas, Clance Boll, NP [place]

## 2022-07-16 ENCOUNTER — Other Ambulatory Visit: Payer: Self-pay

## 2022-07-16 DIAGNOSIS — D509 Iron deficiency anemia, unspecified: Secondary | ICD-10-CM

## 2022-07-16 DIAGNOSIS — Z170 Estrogen receptor positive status [ER+]: Secondary | ICD-10-CM

## 2022-07-16 DIAGNOSIS — D519 Vitamin B12 deficiency anemia, unspecified: Secondary | ICD-10-CM

## 2022-07-17 ENCOUNTER — Ambulatory Visit: Payer: BC Managed Care – PPO | Admitting: Nurse Practitioner

## 2022-07-17 ENCOUNTER — Other Ambulatory Visit: Payer: BC Managed Care – PPO

## 2022-07-27 ENCOUNTER — Other Ambulatory Visit: Payer: Self-pay | Admitting: Internal Medicine

## 2022-07-27 NOTE — Telephone Encounter (Signed)
Requested Prescriptions  Pending Prescriptions Disp Refills   levocetirizine (XYZAL) 5 MG tablet [Pharmacy Med Name: LEVOCETIRIZINE 5 MG TABLET] 90 tablet 3    Sig: TAKE 1 TABLET BY MOUTH EVERY DAY IN THE EVENING     Ear, Nose, and Throat:  Antihistamines - levocetirizine dihydrochloride Passed - 07/27/2022 11:31 AM      Passed - Cr in normal range and within 360 days    Creat  Date Value Ref Range Status  04/15/2022 0.88 0.50 - 0.99 mg/dL Final   Creatinine, Ser  Date Value Ref Range Status  06/26/2022 0.92 0.44 - 1.00 mg/dL Final         Passed - eGFR is 10 or above and within 360 days    GFR calc Af Amer  Date Value Ref Range Status  02/25/2020 >60 >60 mL/min Final   GFR, Estimated  Date Value Ref Range Status  06/26/2022 >60 >60 mL/min Final    Comment:    (NOTE) Calculated using the CKD-EPI Creatinine Equation (2021)   12/19/2021 >60 >60 mL/min Final    Comment:    (NOTE) Calculated using the CKD-EPI Creatinine Equation (2021)    GFR  Date Value Ref Range Status  12/21/2019 58.60 (L) >60.00 mL/min Final   eGFR  Date Value Ref Range Status  04/15/2022 81 > OR = 60 mL/min/1.74m Final         Passed - Valid encounter within last 12 months    Recent Outpatient Visits           3 weeks ago Chronic migraine without aura with status migrainosus, not intractable   [facility]   1 month ago Encounter for general adult medical examination with abnormal findings   [facility]   [facility]   3 months ago Screen for colon cancer   [facility]   1 year ago Other fatigue   [facility]

## 2022-08-19 ENCOUNTER — Ambulatory Visit: Payer: BC Managed Care – PPO | Admitting: Internal Medicine

## 2022-08-19 ENCOUNTER — Encounter: Payer: Self-pay | Admitting: Internal Medicine

## 2022-08-19 VITALS — BP 134/86 | HR 85 | Temp 96.6°F | Wt 222.0 lb

## 2022-08-19 DIAGNOSIS — J029 Acute pharyngitis, unspecified: Secondary | ICD-10-CM

## 2022-08-19 DIAGNOSIS — R6889 Other general symptoms and signs: Secondary | ICD-10-CM | POA: Diagnosis not present

## 2022-08-19 LAB — POCT RAPID STREP A (OFFICE): Rapid Strep A Screen: NEGATIVE

## 2022-08-19 LAB — POC COVID19 BINAXNOW: SARS Coronavirus 2 Ag: NEGATIVE

## 2022-08-19 MED ORDER — PROMETHAZINE-DM 6.25-15 MG/5ML PO SYRP: 5.0000 mL | ORAL_SOLUTION | Freq: Four times a day (QID) | ORAL | 0 refills | Status: DC | PRN

## 2022-08-19 MED ORDER — OSELTAMIVIR PHOSPHATE 75 MG PO CAPS: 75.0000 mg | ORAL_CAPSULE | Freq: Two times a day (BID) | ORAL | 0 refills | Status: DC

## 2022-08-19 NOTE — Patient Instructions (Signed)
Influenza Tests Why am I having this test? You may have an influenza test to help your health care provider determine what type of respiratory infection you have. The test may also be used to help determine a treatment plan or to monitor influenza activity within a community. What is being tested? This test checks a sample of bodily fluid (secretions) for the presence of the influenza virus. What kind of sample is taken?     A sample of secretions is required for this test. The sample is collected by swabbing through your nose or throat, or collecting secretions (aspirate) from the nasal cavity. What happens during the test? Your health care provider may perform one or both of the following tests: A rapid influenza test. This test is more accurate when completed within 3-4 days after your symptoms begin. A test done on nasal secretions is more accurate than a test done on a sample taken from your throat. Depending on the method, a rapid influenza test may be completed in less than 30 minutes in your health care provider's office. It can also be sent to a lab, with the results becoming available the same day. Depending on the test used, it can identify the type of influenza. Usually, it is type A or type B. This may help to monitor influenza activity within a community. A viral culture. This test also requires the collection of secretions from your nose or throat. The sample is then sent to a lab for processing. This may take several days to complete. How are the results reported? Your test results will be reported as either positive or negative. The normal result for this test is: Negative for influenza viruses of type A or type B. Sometimes, the test results may report that a condition is present when it is not present. This is called a false-positive result. The test results may also report that a condition is not present when it is present. This is called a false-negative result. What do the  results mean? A positive test result means that you have influenza. A negative test result means that you likely do not have influenza. False-negative results are more likely to happen at the height of the influenza season. Talk with your health care provider about what your test results mean. Questions to ask your health care provider Ask your health care provider, or the department that is doing the test: When will my results be ready? How will I get my results? What are my treatment options? What other tests do I need? What are my next steps? Summary An influenza test helps your health care provider check for the presence of the influenza virus. A sample of fluid (secretions) is required for this test. The sample is collected by swabbing your nose or throat, or by collecting secretions (aspirate) from the nasal cavity. Your health care provider may use a rapid influenza test or a viral culture, or both, to test your sample. Results of a rapid test can be given within 30 minutes. Results of a viral culture can take several days. Talk with your health care provider about what your results mean. This information is not intended to replace advice given to you by your health care provider. Make sure you discuss any questions you have with your health care provider. Document Revised: 04/12/2020 Document Reviewed: 04/12/2020 Elsevier Patient Education  [place].

## 2022-08-19 NOTE — Progress Notes (Signed)
Subjective:    Patient ID: [name], female    DOB: 04/02/1971, 51 y.o.   MRN: [id number]  HPI  Patient presents to clinic today with complaint of fever runny nose, sore throat, cough and diarrhea.  This started 2 days ago. She is blowing clear/yellow mucous out of her nose. She is having difficulty swallowing. The cough is productive yellow mucous. She denies headache, nasal congestion, ear pain, shortness of breath, nausea or vomiting. She reports fever up to 101 but denies chills or body aches. She has tried [facility] and Flu and Tylenol with minimal relief of symptoms. 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 She had a negative home covid test.  Review of Systems     Past Medical History:  Diagnosis Date   Allergy    Anemia    present for many years, mild, stable   Anxiety    Breast cancer ([place])    Breast cancer ([place]) 12/18/2020   Dysrhythmia    PAC's on flecanide (did not tolerate beta blockers)   Family history of breast cancer 01/20/2021   Family history of prostate cancer 01/20/2021   Frequent headaches    HSV infection    fever blisters   Hypertension    Hypothyroidism    Personal history of radiation therapy     Current Outpatient Medications  Medication Sig Dispense Refill   ALPRAZolam (XANAX) 0.25 MG tablet Take 1 tablet (0.25 mg total) by mouth 2 (two) times daily as needed. 20 tablet 0   atorvastatin (LIPITOR) 10 MG tablet TAKE 1 TABLET BY MOUTH EVERY DAY 90 tablet 1   butalbital-acetaminophen-caffeine (FIORICET) 50-325-40 MG tablet Take 1 tablet by mouth every 6 (six) hours as needed for headache. 20 tablet 5   cyclobenzaprine (FLEXERIL) 10 MG tablet Take 1 tablet (10 mg total) by mouth daily as needed (migraines). 15 tablet 5   doxycycline (VIBRA-TABS) 100 MG tablet Take 1 tablet (100 mg total) by mouth 2 (two) times daily. 20 tablet 0   flecainide (TAMBOCOR) 50 MG tablet Take 1 tablet (50 mg total) by mouth 2  (two) times daily. 180 tablet 2   fluticasone (FLONASE) 50 MCG/ACT nasal spray SPRAY 2 SPRAYS INTO EACH NOSTRIL EVERY DAY 48 mL 0   gabapentin (NEURONTIN) 100 MG capsule Take 1 capsule (100 mg total) by mouth 3 (three) times daily as needed. Use in addition to '300mg'$  dose 90 capsule 1   gabapentin (NEURONTIN) 300 MG capsule TAKE 1 CAPSULE BY MOUTH TWICE A DAY 60 capsule 1   KLOR-CON M10 10 MEQ tablet TAKE 1 TABLET BY MOUTH TWICE A DAY 180 tablet 1   levocetirizine (XYZAL) 5 MG tablet TAKE 1 TABLET BY MOUTH EVERY DAY IN THE EVENING 90 tablet 3   levothyroxine (SYNTHROID) 100 MCG tablet TAKE 1 TABLET BY MOUTH EVERY DAY 90 tablet 0   LORazepam (ATIVAN) 0.5 MG tablet Take 0.5 mg by mouth every 8 (eight) hours as needed.     losartan (COZAAR) 50 MG tablet Take 1 tablet (50 mg total) by mouth daily. 90 tablet 3   omeprazole (PRILOSEC) 20 MG capsule Take 1 capsule (20 mg total) by mouth daily. 90 capsule 1   rizatriptan (MAXALT-MLT) 10 MG disintegrating tablet Take 1 tablet (10 mg total) by mouth as needed for migraine. May repeat in 2 hours if needed 10 tablet 1   SUMAtriptan (IMITREX) 25 MG tablet TAKE 1 TABLET (25 MG TOTAL)  BY MOUTH EVERY 2 (TWO) HOURS AS NEEDED FOR MIGRAINE. MAY REPEAT IN 2 HOURS IF HEADACHE PERSISTS OR RECURS. 10 tablet 0   tamoxifen (NOLVADEX) 20 MG tablet TAKE 1 TABLET BY MOUTH EVERY DAY 90 tablet 1   traMADol (ULTRAM) 50 MG tablet Take 1 tablet (50 mg total) by mouth every 12 (twelve) hours as needed. 20 tablet 0   valACYclovir (VALTREX) 500 MG tablet TAKE 1 TABLET (500 MG TOTAL) BY MOUTH DAILY AS NEEDED (HERPES SIMPLEX). 90 tablet 0   venlafaxine XR (EFFEXOR-XR) 75 MG 24 hr capsule TAKE 1 CAPSULE BY MOUTH DAILY WITH BREAKFAST. 90 capsule 1   No current facility-administered medications for this visit.    Allergies  Allergen Reactions   Ciprofloxacin     anxiety   Keflex [Cephalexin] Hives   Trazodone And Nefazodone Other (See Comments)    nightmares    Family History   Problem Relation Age of Onset   Depression Mother    Breast cancer Mother 39   Hyperlipidemia Father    Prostate cancer Father 57    Social History   Socioeconomic History   Marital status: Single    Spouse name: Not on file   Number of children: 1   Years of education: Not on file   Highest education level: Not on file  Occupational History   Not on file  Tobacco Use   Smoking status: Never   Smokeless tobacco: Never  Vaping Use   Vaping Use: Never used  Substance and Sexual Activity   Alcohol use: Yes    Comment: occasional   Drug use: No   Sexual activity: Yes    Partners: Male    Birth control/protection: Pill  Other Topics Concern   Not on file  Social History Narrative   Not on file   Social Determinants of Health   Financial Resource Strain: Not on file  Food Insecurity: Not on file  Transportation Needs: Not on file  Physical Activity: Not on file  Stress: Not on file  Social Connections: Not on file  Intimate Partner Violence: Not At Risk (01/02/2021)   Humiliation, Afraid, Rape, and Kick questionnaire    Fear of Current or Ex-Partner: No    Emotionally Abused: No    Physically Abused: No    Sexually Abused: No     Constitutional: Patient reports fever.  Denies malaise, fatigue, headache or abrupt weight changes.  HEENT: Patient reports runny nose, sore throat.  Denies eye pain, eye redness, ear pain, ringing in the ears, wax buildup, nasal congestion, bloody nose. Respiratory: Pt reports cough. Denies difficulty breathing, shortness of breath.   Cardiovascular: Denies chest pain, chest tightness, palpitations or swelling in the hands or feet.  Gastrointestinal: Pt reports diarrhea. Denies abdominal pain, bloating, constipation, or blood in the stool.   No other specific complaints in a complete review of systems (except as listed in HPI above).  Objective:   Physical Exam BP 134/86 (BP Location: Right Arm, Patient Position: Sitting, Cuff Size:  Normal)   Pulse 85   Temp (!) 96.6 F (35.9 C) (Temporal)   Wt 222 lb (100.7 kg)   SpO2 99%   BMI 36.94 kg/m   Wt Readings from Last 3 Encounters:  07/02/22 216 lb (98 kg)  06/26/22 216 lb (98 kg)  06/25/22 215 lb (97.5 kg)    General: Appears her stated age, obese, in NAD. HEENT: Head: normal shape and size, no sinus tenderness noted; Eyes: sclera white, no icterus, conjunctiva  pink, PERRLA and EOMs intact; Throat/Mouth: Teeth present, mucosa erythematous and moist, no exudate, lesions or ulcerations noted.  Neck:  No adenopathy noted Cardiovascular: Normal rate and rhythm. S1,S2 noted.  No murmur, rubs or gallops noted.  Pulmonary/Chest: Normal effort and positive vesicular breath sounds. No respiratory distress. No wheezes, rales or ronchi noted.  Neurological: Alert and oriented.    BMET    Component Value Date/Time   NA 138 06/26/2022 2248   NA 143 01/26/2020 1600   K 3.2 (L) 06/26/2022 2248   CL 103 06/26/2022 2248   CO2 21 (L) 06/26/2022 2248   GLUCOSE 139 (H) 06/26/2022 2248   BUN 13 06/26/2022 2248   BUN 11 01/26/2020 1600   CREATININE 0.92 06/26/2022 2248   CREATININE 0.88 04/15/2022 1113   CALCIUM 9.5 06/26/2022 2248   GFRNONAA >60 06/26/2022 2248   GFRNONAA >60 12/19/2021 1245   GFRAA >60 02/25/2020 2231    Lipid Panel     Component Value Date/Time   CHOL 263 (H) 04/15/2022 1113   TRIG 158 (H) 04/15/2022 1113   HDL 59 04/15/2022 1113   CHOLHDL 4.5 04/15/2022 1113   VLDL 18.8 12/21/2019 1204   LDLCALC 174 (H) 04/15/2022 1113    CBC    Component Value Date/Time   WBC 5.7 06/26/2022 2248   RBC 4.75 06/26/2022 2248   HGB 14.4 06/26/2022 2248   HGB 12.9 12/19/2021 1245   HGB 10.1 (L) 04/25/2020 1154   HCT 41.7 06/26/2022 2248   HCT 31.6 (L) 04/25/2020 1154   PLT 291 06/26/2022 2248   PLT 189 12/19/2021 1245   PLT 261 04/25/2020 1154   MCV 87.8 06/26/2022 2248   MCV 80 04/25/2020 1154   MCH 30.3 06/26/2022 2248   MCHC 34.5 06/26/2022 2248    RDW 12.6 06/26/2022 2248   RDW 16.8 (H) 04/25/2020 1154   LYMPHSABS 1.4 06/26/2022 2248   LYMPHSABS 1.4 04/25/2020 1154   MONOABS 0.4 06/26/2022 2248   EOSABS 0.0 06/26/2022 2248   EOSABS 0.0 04/25/2020 1154   BASOSABS 0.1 06/26/2022 2248   BASOSABS 0.1 04/25/2020 1154    Hgb A1C Lab Results  Component Value Date   HGBA1C 5.2 04/15/2022           Assessment & Plan:   Flu Like Symptoms, Sore Throat:  Rapid strep negative Rapid COVID-negative Encourage rest and fluids Take Ibuprofen 800 mg every 8 hours as needed for sore throat Rx for Tamiflu 75 mg twice daily x 5 days Rx for Promethazine DM cough syrup Work note provided  RTC in 4 months for follow-up of chronic conditions Webb Silversmith, NP

## 2022-08-26 ENCOUNTER — Other Ambulatory Visit: Payer: Self-pay | Admitting: Hematology

## 2022-09-07 IMAGING — US US BREAST*L* LIMITED INC AXILLA
1 series · 8 of 8 positions shown · non-contrast
Comparison: Previous exam(s).

CLINICAL DATA: 50-year-old female status post malignant left
lumpectomy with radiation treatment presents for annual bilateral
mammogram and short-term follow-up of a probably benign lumpectomy
bed mass.

EXAM:
DIGITAL DIAGNOSTIC BILATERAL MAMMOGRAM WITH TOMOSYNTHESIS AND CAD;
ULTRASOUND LEFT BREAST LIMITED
TECHNIQUE: Bilateral digital diagnostic mammography and breast tomosynthesis
was performed. The images were evaluated with computer-aided
detection.; Targeted ultrasound examination of the left breast was
performed.

[Series 1: us breast*left* limited inc axilla · 0.07mm/px · 8 acquisitions, 8 frames shown]
[im 1/8]
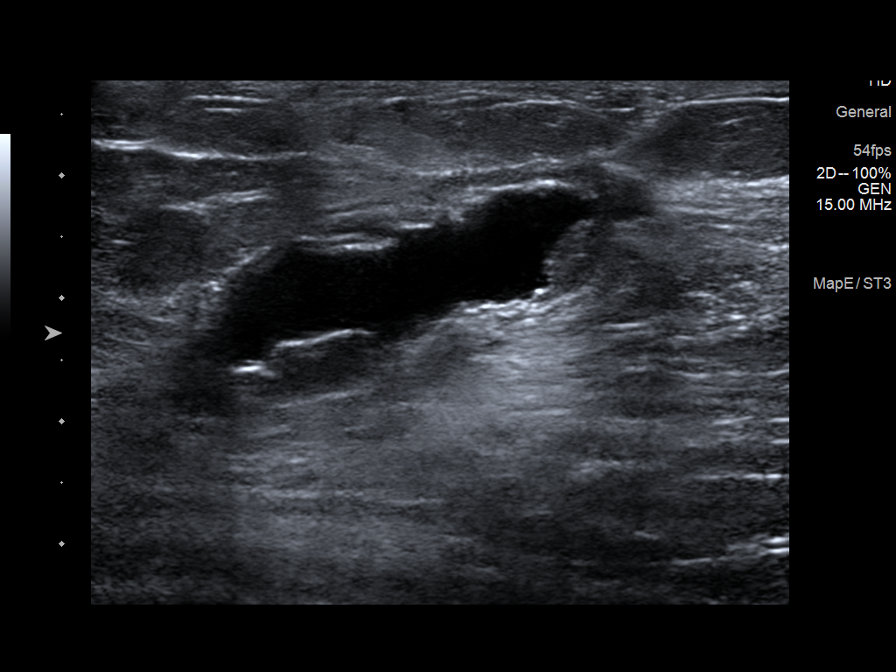
[im 2/8]
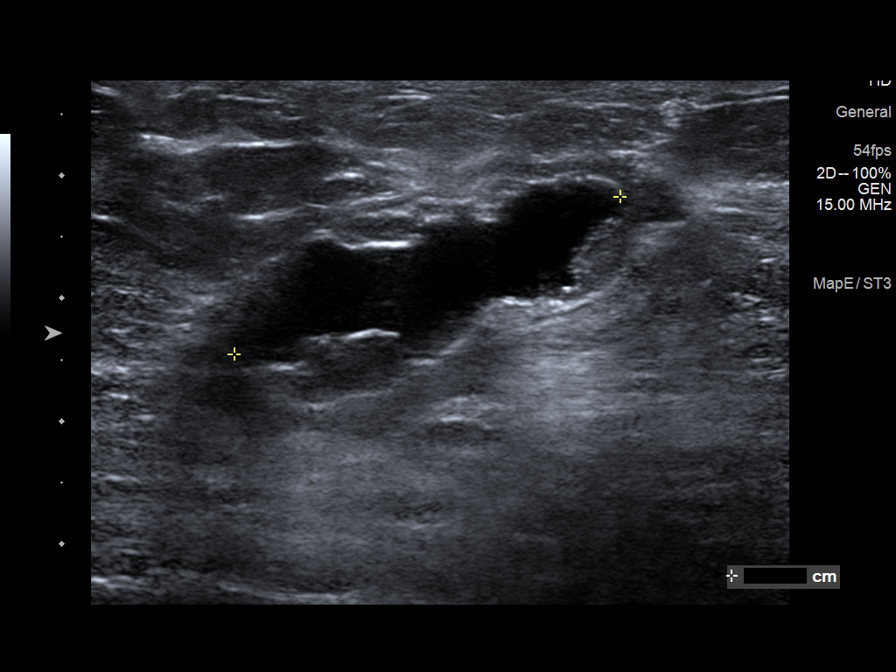
[im 3/8]
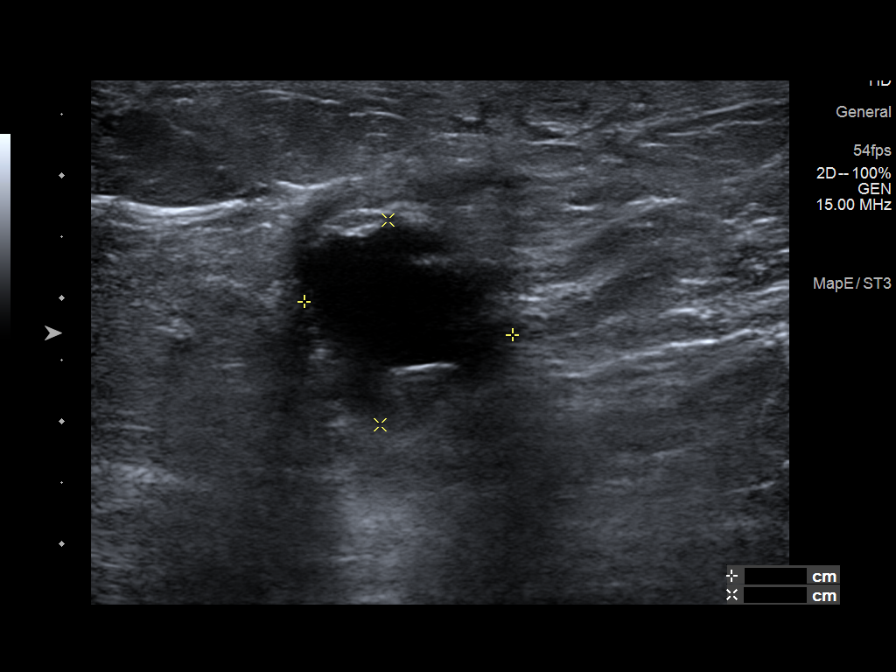
[im 4/8]
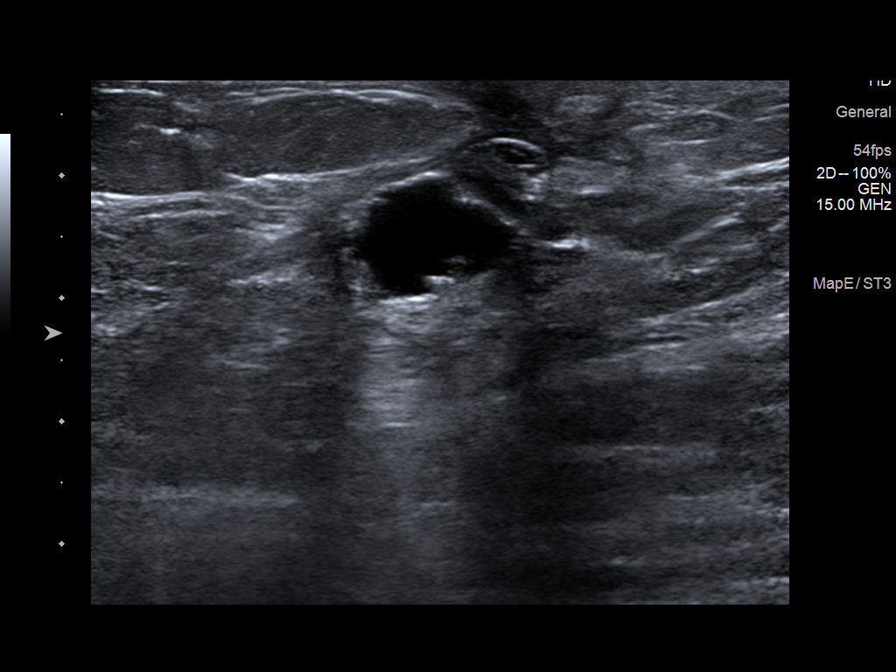
[im 5/8]
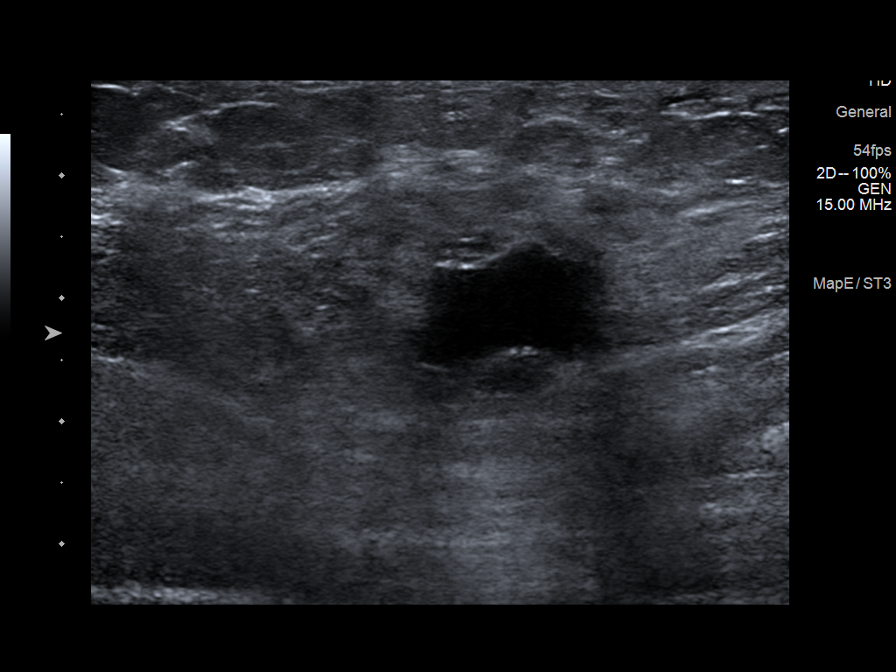
[im 6/8]
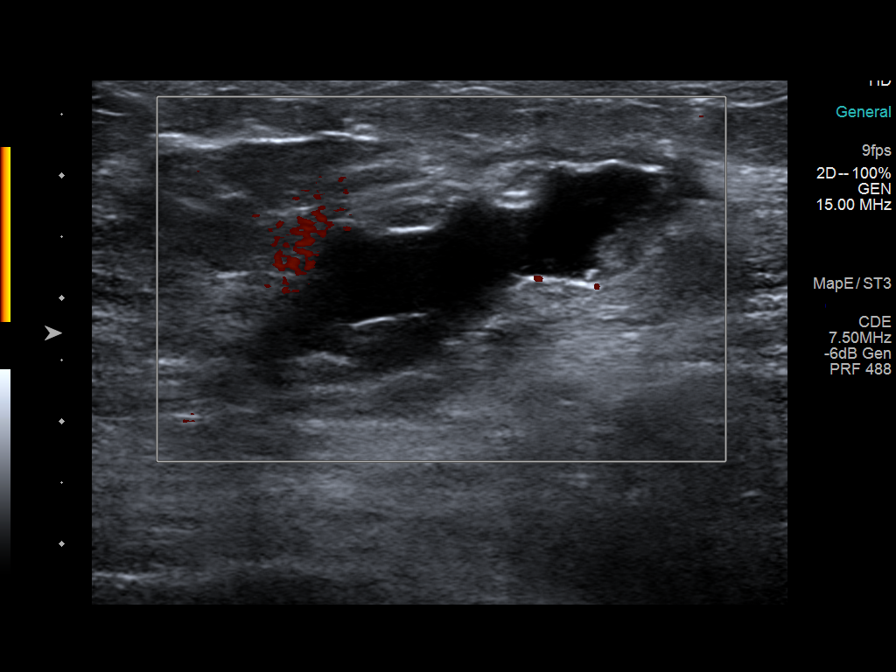
[im 7/8]
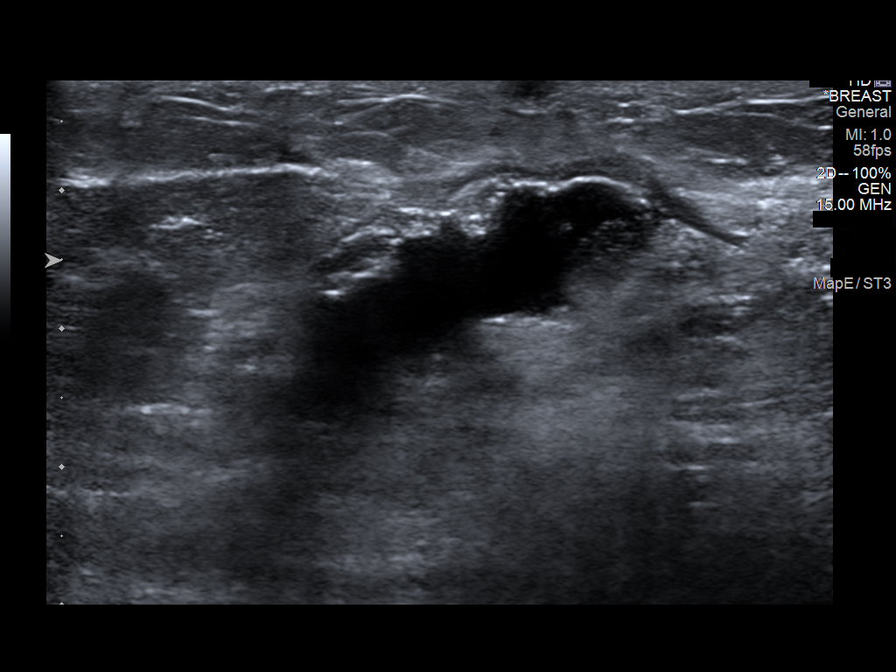
[im 8/8]
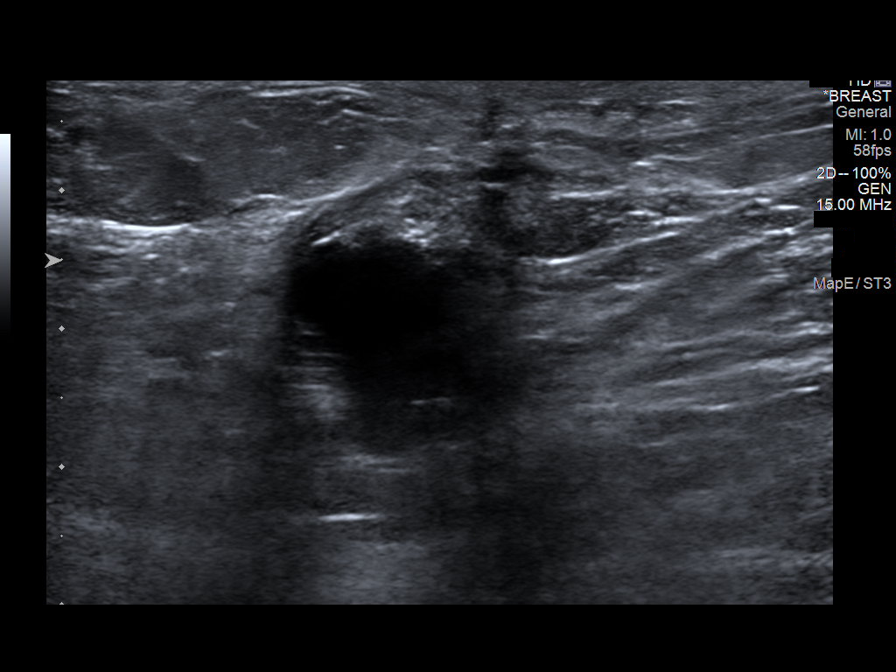

[8 of 8 positions shown; findings below may reference images not displayed]

ACR Breast Density Category b: There are scattered areas of
fibroglandular density.
FINDINGS: Post lumpectomy changes again noted in the upper-outer left breast.
No new or suspicious findings in the remainder of either breast.

Targeted ultrasound is performed, showing a 3.4 x 1.7 x 1.7 cm
postsurgical seroma at the 2 o'clock position 4 cm from the nipple
on the left. The appearance is stable when compared to recent MRI
evaluation. This is consistent with a benign process. No new or
suspicious findings identified.
IMPRESSION: 1. No mammographic evidence of malignancy in either breast.
2. Left breast posttreatment changes.

RECOMMENDATION:
Diagnostic mammogram is suggested in 1 year. (Code:UT-3-14A)

I have discussed the findings and recommendations with the patient.
If applicable, a reminder letter will be sent to the patient
regarding the next appointment.

BI-RADS CATEGORY  2: Benign.

## 2022-09-17 ENCOUNTER — Encounter: Payer: Self-pay | Admitting: Internal Medicine

## 2022-09-18 ENCOUNTER — Other Ambulatory Visit: Payer: BC Managed Care – PPO

## 2022-09-18 ENCOUNTER — Other Ambulatory Visit: Payer: Self-pay

## 2022-09-18 DIAGNOSIS — E039 Hypothyroidism, unspecified: Secondary | ICD-10-CM

## 2022-09-19 LAB — TSH+FREE T4: TSH W/REFLEX TO FT4: 0.44 mIU/L

## 2022-09-26 ENCOUNTER — Other Ambulatory Visit: Payer: Self-pay | Admitting: Nurse Practitioner

## 2022-10-04 NOTE — Assessment & Plan Note (Addendum)
her prior lap band surgery is the likely cause of her B12 deficiency. I started her on B12 injections from 01/29/21, continue monthly  -she has not had B12 injection after 12/2021

## 2022-10-04 NOTE — Assessment & Plan Note (Signed)
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 Biopsy of the left breast mass on 10/31/20 was again benign but felt to be discordant. -left lumpectomy under Dr. Brantley Stage on 12/18/20 showed invasive lobular carcinoma, 1.1 cm, grade 2, negative resection margins. ER+ (weakly), PR+, Her2-. RS 16 with less than 1% benefit of chemotherapy.  -Her 01/07/21 Sentinel LN biopsy was negative.  -Her genetic testing from 01/16/21 was negative for pathogenetic mutations.  -S/p adjuvant radiation with Dr. Lisbeth Renshaw on 02/05/21 - 03/19/21.  -she started adjuvant tamoxifen in April 2023

## 2022-10-04 NOTE — Assessment & Plan Note (Signed)
-  Secondary to menorrhagia  -she required 3 doses of IV Venofer in 02/2021 and 10/2021

## 2022-10-05 ENCOUNTER — Encounter: Payer: Self-pay | Admitting: Hematology

## 2022-10-05 ENCOUNTER — Inpatient Hospital Stay: Payer: BC Managed Care – PPO | Attending: Hematology | Admitting: Hematology

## 2022-10-05 ENCOUNTER — Inpatient Hospital Stay: Payer: BC Managed Care – PPO

## 2022-10-05 ENCOUNTER — Other Ambulatory Visit: Payer: Self-pay

## 2022-10-05 VITALS — BP 142/88 | HR 90 | Temp 98.0°F | Resp 18 | Ht 65.0 in | Wt 231.3 lb

## 2022-10-05 DIAGNOSIS — D509 Iron deficiency anemia, unspecified: Secondary | ICD-10-CM | POA: Insufficient documentation

## 2022-10-05 DIAGNOSIS — Z9884 Bariatric surgery status: Secondary | ICD-10-CM | POA: Insufficient documentation

## 2022-10-05 DIAGNOSIS — C50412 Malignant neoplasm of upper-outer quadrant of left female breast: Secondary | ICD-10-CM

## 2022-10-05 DIAGNOSIS — D519 Vitamin B12 deficiency anemia, unspecified: Secondary | ICD-10-CM

## 2022-10-05 DIAGNOSIS — Z923 Personal history of irradiation: Secondary | ICD-10-CM | POA: Diagnosis not present

## 2022-10-05 DIAGNOSIS — E2839 Other primary ovarian failure: Secondary | ICD-10-CM

## 2022-10-05 DIAGNOSIS — Z170 Estrogen receptor positive status [ER+]: Secondary | ICD-10-CM | POA: Diagnosis not present

## 2022-10-05 DIAGNOSIS — D508 Other iron deficiency anemias: Secondary | ICD-10-CM

## 2022-10-05 DIAGNOSIS — Z79899 Other long term (current) drug therapy: Secondary | ICD-10-CM | POA: Insufficient documentation

## 2022-10-05 DIAGNOSIS — E538 Deficiency of other specified B group vitamins: Secondary | ICD-10-CM | POA: Insufficient documentation

## 2022-10-05 LAB — CBC WITH DIFFERENTIAL (CANCER CENTER ONLY)
Abs Immature Granulocytes: 0.01 10*3/uL (ref 0.00–0.07)
Basophils Absolute: 0.1 10*3/uL (ref 0.0–0.1)
Basophils Relative: 1 %
Eosinophils Absolute: 0.1 10*3/uL (ref 0.0–0.5)
Eosinophils Relative: 2 %
HCT: 36.3 % (ref 36.0–46.0)
Hemoglobin: 12.6 g/dL (ref 12.0–15.0)
Immature Granulocytes: 0 %
Lymphocytes Relative: 26 %
Lymphs Abs: 1.3 10*3/uL (ref 0.7–4.0)
MCH: 29.8 pg (ref 26.0–34.0)
MCHC: 34.7 g/dL (ref 30.0–36.0)
MCV: 85.8 fL (ref 80.0–100.0)
Monocytes Absolute: 0.3 10*3/uL (ref 0.1–1.0)
Monocytes Relative: 6 %
Neutro Abs: 3.2 10*3/uL (ref 1.7–7.7)
Neutrophils Relative %: 65 %
Platelet Count: 223 10*3/uL (ref 150–400)
RBC: 4.23 MIL/uL (ref 3.87–5.11)
RDW: 14.4 % (ref 11.5–15.5)
WBC Count: 4.9 10*3/uL (ref 4.0–10.5)
nRBC: 0 % (ref 0.0–0.2)

## 2022-10-05 LAB — CMP (CANCER CENTER ONLY)
ALT: 9 U/L (ref 0–44)
AST: 13 U/L — ABNORMAL LOW (ref 15–41)
Albumin: 3.9 g/dL (ref 3.5–5.0)
Alkaline Phosphatase: 104 U/L (ref 38–126)
Anion gap: 6 (ref 5–15)
BUN: 10 mg/dL (ref 6–20)
CO2: 30 mmol/L (ref 22–32)
Calcium: 9.4 mg/dL (ref 8.9–10.3)
Chloride: 103 mmol/L (ref 98–111)
Creatinine: 0.92 mg/dL (ref 0.44–1.00)
GFR, Estimated: >60 mL/min (ref >60)
Glucose, Bld: 69 mg/dL — ABNORMAL LOW (ref 70–99)
Potassium: 3.8 mmol/L (ref 3.5–5.1)
Sodium: 139 mmol/L (ref 135–145)
Total Bilirubin: 0.5 mg/dL (ref 0.3–1.2)
Total Protein: 7.3 g/dL (ref 6.5–8.1)

## 2022-10-05 LAB — VITAMIN B12: Vitamin B-12: 185 pg/mL (ref 180–914)

## 2022-10-05 LAB — FERRITIN: Ferritin: 47 ng/mL (ref 11–307)

## 2022-10-05 LAB — IRON AND IRON BINDING CAPACITY (CC-WL,HP ONLY)
Iron: 99 ug/dL (ref 28–170)
Saturation Ratios: 29 % (ref 10.4–31.8)
TIBC: 339 ug/dL (ref 250–450)
UIBC: 240 ug/dL (ref 148–442)

## 2022-10-05 NOTE — Progress Notes (Signed)
[facility]   [phone] Fax:([phone]) [phone]   Clinic Follow up Note   Patient Care Team: Jearld Fenton, NP as PCP - General (Internal Medicine) Minna Merritts, MD as PCP - Cardiology (Cardiology) Erroll Luna, MD as Consulting Physician (General Surgery) Truitt Merle, MD as Consulting Physician (Hematology) Alla Feeling, NP as Nurse Practitioner (Nurse Practitioner) Kyung Rudd, MD as Consulting Physician (Radiation Oncology)  Date of Service:  10/05/2022  CHIEF COMPLAINT: f/u of left breast cancer   CURRENT THERAPY: pending tamoxifen vs anastrozole    ASSESSMENT:  [name] is a 52 y.o. 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 Biopsy of the left breast mass on 10/31/20 was again benign but felt to be discordant. -left lumpectomy under Dr. Brantley Stage on 12/18/20 showed invasive lobular carcinoma, 1.1 cm, grade 2, negative resection margins. ER+ (weakly), PR+, Her2-. RS 16 with less than 1% benefit of chemotherapy.  -Her 01/07/21 Sentinel LN biopsy was negative.  -Her genetic testing from 01/16/21 was negative for pathogenetic mutations.  -S/p adjuvant radiation with Dr. Lisbeth Renshaw on 02/05/21 - 03/19/21.  -I prescribed tamoxifen in April 2023 but she has not started due to concern of side effects.  I again discussed the benefit of anticoagulant therapy, she is willing to try for the peripheral aromatase inhibitor if she is eligible. -Will check her [place] again today, if she is postmenopausal, I will call in anastrozole.  I reviewed the potential side effect of anastrozole and she voiced good understanding.  B12 deficiency anemia her prior lap band surgery is the likely cause of her B12 deficiency. I started her on B12 injections from 01/29/21, continue  monthly  -she has not had B12 injection after 12/2021   Iron deficiency anemia -Secondary to menorrhagia  -she required 3 doses of IV Venofer in 02/2021 and 10/2021   PLAN: - schedule bone density exam - mammogram due in April  - will start anastrozole if her [facility] is in postmenopausal range - f/u in 6 months -Her B12 level is low again, we will restart her on B12 injections.  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  IMPRESSION: Indeterminate left breast mass at 2 o'clock 7 cm from the nipple. Targeted ultrasound of the left axilla demonstrates normal lymph nodes.   10/31/2020 Pathology Results   Breast, left, needle core biopsy, 2 o'clock - FIBROADIPOSE TISSUE WITH MINIMAL BREAST PARENCHYMA DEMONSTRATING MILD FIBROCYSTIC CHANGE TO INCLUDE APOCRINE METAPLASIA.  This was found to be discordant.   12/18/2020 Surgery   A. BREAST, LEFT, LUMPECTOMY:  - Invasive lobular carcinoma, 1.1 cm, grade 2.  See comment  - Resection margins are negative for carcinoma; inferior margin is  focally less than 1 mm from carcinoma and medial margin is approximately  1 mm from carcinoma  -  Biopsy related changes  - Background fibrocystic change   PROGNOSTIC INDICATOR RESULTS:  - The tumor cells are NEGATIVE for Her2 (0).  - Estrogen Receptor:       POSITIVE, 30%, WEAK STAINING  - Progesterone  Receptor:   POSITIVE, 80%, MODERATE STAINING  - Proliferation Marker Ki-67:   <5%    12/18/2020 Oncotype testing   Oncotype  Recurrence Score 16  Distant recurrence risk at 9 years of 4% with AI or Tamoxifen alone.  Less than 1% benefit of chemotherapy.    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 LYMPH NODE, LEFT, SENTINEL, EXCISION:  - One lymph node negative for metastatic carcinoma (0/1).   B. LYMPH NODE, LEFT, SENTINEL, EXCISION:  - One lymph node negative for metastatic carcinoma (0/1).   COMMENT:  Immunohistochemistry for cytokeratin AE1/AE3 is performed on parts A and  B and no metastatic carcinoma is identified   02/05/2021 - 03/19/2021 Radiation Therapy   Adjuvant Radiation with Dr Lisbeth Renshaw    05/13/2021 -  Anti-estrogen oral therapy   Tamoxifen '20mg'$  once daily starting in September 2022   05/13/2021 Survivorship   SCP delivered by Cira Rue, NP      INTERVAL HISTORY:  [name] is here for a follow up of left breast cancer.  She was last seen by me on 12/19/2021. She presents to the clinic alone, pt reports breast sensitivity, pt hasn't started Tamoxifen, pt has concerns about side effects of medicine, no other issues since her last visit. All other systems were reviewed with the patient and are negative.  MEDICAL HISTORY:  Past Medical History:  Diagnosis Date   Allergy    Anemia    present for many years, mild, stable   Anxiety     Breast cancer ([place])    Breast cancer ([place]) 12/18/2020   Dysrhythmia    PAC's on flecanide (did not tolerate beta blockers)   Family history of breast cancer 01/20/2021   Family history of prostate cancer 01/20/2021   Frequent headaches    HSV infection    fever blisters   Hypertension    Hypothyroidism    Personal history of radiation therapy     SURGICAL HISTORY: Past Surgical History:  Procedure Laterality Date   APPENDECTOMY  2013   BREAST BIOPSY Left 10/2020   BREAST LUMPECTOMY Left 12/18/2020   BREAST LUMPECTOMY WITH RADIOACTIVE SEED LOCALIZATION Left 12/18/2020   Procedure: LEFT BREAST LUMPECTOMY WITH RADIOACTIVE SEED LOCALIZATION;  Surgeon: Erroll Luna, MD;  Location: [facility];  Service: General;  Laterality: Left;   KNEE ARTHROSCOPY Right    lapband  2013   SENTINEL NODE BIOPSY Left 01/07/2021   Procedure: LEFT SENTINEL NODE BIOPSY;  Surgeon: Erroll Luna, MD;  Location: [facility];  Service: General;  Laterality: Left;   TONSILLECTOMY  2012    I have reviewed the social history and family history with the patient and they are unchanged from previous note.  ALLERGIES:  is allergic to  ciprofloxacin, keflex [cephalexin], and trazodone and nefazodone.  MEDICATIONS:  Current Outpatient Medications  Medication Sig Dispense Refill   ALPRAZolam (XANAX) 0.25 MG tablet Take 1 tablet (0.25 mg total) by mouth 2 (two) times daily as needed. 20 tablet 0   atorvastatin (LIPITOR) 10 MG tablet TAKE 1 TABLET BY MOUTH EVERY DAY 90 tablet 1   butalbital-acetaminophen-caffeine (FIORICET) 50-325-40 MG tablet Take 1 tablet by mouth every 6 (six) hours as needed for headache. 20 tablet 5   cyclobenzaprine (FLEXERIL) 10 MG tablet Take 1 tablet (10 mg total) by mouth daily as needed (migraines). 15 tablet 5   flecainide (TAMBOCOR) 50 MG tablet Take 1 tablet (50 mg total) by mouth 2 (two) times daily. 180 tablet 2   fluticasone (FLONASE) 50 MCG/ACT nasal spray SPRAY 2 SPRAYS  INTO EACH NOSTRIL EVERY DAY 48 mL 0   gabapentin (NEURONTIN) 100 MG capsule Take 1 capsule (100 mg total) by mouth 3 (three) times daily as needed. Use in addition to '300mg'$  dose 90 capsule 1   gabapentin (NEURONTIN) 300 MG capsule TAKE 1 CAPSULE BY MOUTH TWICE A DAY 60 capsule 1   KLOR-CON M10 10 MEQ tablet TAKE 1 TABLET BY MOUTH TWICE A DAY 180 tablet 1   levocetirizine (XYZAL) 5 MG tablet TAKE 1 TABLET BY MOUTH EVERY DAY IN THE EVENING 90 tablet 3   levothyroxine (SYNTHROID) 100 MCG tablet TAKE 1 TABLET BY MOUTH EVERY DAY 90 tablet 0   LORazepam (ATIVAN) 0.5 MG tablet Take 0.5 mg by mouth every 8 (eight) hours as needed.     losartan (COZAAR) 50 MG tablet Take 1 tablet (50 mg total) by mouth daily. 90 tablet 3   omeprazole (PRILOSEC) 20 MG capsule Take 1 capsule (20 mg total) by mouth daily. 90 capsule 1   oseltamivir (TAMIFLU) 75 MG capsule Take 1 capsule (75 mg total) by mouth 2 (two) times daily. For 5 days 10 capsule 0   promethazine-dextromethorphan (PROMETHAZINE-DM) 6.25-15 MG/5ML syrup Take 5 mLs by mouth 4 (four) times daily as needed. 118 mL 0   rizatriptan (MAXALT-MLT) 10 MG disintegrating tablet Take 1 tablet (10 mg total) by mouth as needed for migraine. May repeat in 2 hours if needed 10 tablet 1   SUMAtriptan (IMITREX) 25 MG tablet TAKE 1 TABLET (25 MG TOTAL) BY MOUTH EVERY 2 (TWO) HOURS AS NEEDED FOR MIGRAINE. MAY REPEAT IN 2 HOURS IF HEADACHE PERSISTS OR RECURS. 10 tablet 0   tamoxifen (NOLVADEX) 20 MG tablet TAKE 1 TABLET BY MOUTH EVERY DAY 90 tablet 1   traMADol (ULTRAM) 50 MG tablet Take 1 tablet (50 mg total) by mouth every 12 (twelve) hours as needed. 20 tablet 0   valACYclovir (VALTREX) 500 MG tablet TAKE 1 TABLET (500 MG TOTAL) BY MOUTH DAILY AS NEEDED (HERPES SIMPLEX). 90 tablet 0   venlafaxine XR (EFFEXOR-XR) 75 MG 24 hr capsule TAKE 1 CAPSULE BY MOUTH DAILY WITH BREAKFAST. 90 capsule 1   No current facility-administered medications for this visit.    PHYSICAL  EXAMINATION: ECOG PERFORMANCE STATUS: 0 - Asymptomatic  Vitals:   10/05/22 1356  BP: (!) 142/88  Pulse: 90  Resp: 18  Temp: 98 F (36.7 C)  SpO2: 98%   Wt Readings from Last 3 Encounters:  10/05/22 231 lb 4.8 oz (104.9 kg)  08/19/22 222 lb (100.7 kg)  07/02/22 216 lb (98 kg)     NECK: (-)supple, thyroid normal size, non-tender, without nodularity LYMPH: (-) no palpable lymphadenopathy in the cervical, axillary  BREAST:(-) right breast no nodules benign exam , left breast no nodules and benign exam   LABORATORY DATA:  I have reviewed the data as listed    Latest Ref Rng & Units 10/05/2022    1:39 PM 06/26/2022   10:48 PM 04/15/2022   11:13 AM  CBC  WBC 4.0 - 10.5 K/uL 4.9  5.7  3.7   Hemoglobin 12.0 - 15.0 g/dL 12.6  14.4  13.1   Hematocrit 36.0 - 46.0 % 36.3  41.7  38.7   Platelets 150 - 400 K/uL 223  291  186         Latest Ref Rng & Units 10/05/2022    1:39 PM 06/26/2022   10:48 PM 04/15/2022   11:13 AM  CMP  Glucose 70 - 99 mg/dL 69  139  122   BUN 6 - 20 mg/dL '10  13  11   '$ Creatinine 0.44 - 1.00 mg/dL 0.92  0.92  0.88   Sodium 135 - 145 mmol/L 139  138  139   Potassium 3.5 - 5.1 mmol/L 3.8  3.2  3.8   Chloride 98 - 111 mmol/L 103  103  102   CO2 22 - 32 mmol/L '30  21  27   '$ Calcium 8.9 - 10.3 mg/dL 9.4  9.5  9.0   Total Protein 6.5 - 8.1 g/dL 7.3  8.3  6.6   Total Bilirubin 0.3 - 1.2 mg/dL 0.5  1.1  0.4   Alkaline Phos 38 - 126 U/L 104  127    AST 15 - 41 U/L '13  25  16   '$ ALT 0 - 44 U/L '9  11  11       '$ RADIOGRAPHIC STUDIES: I have personally reviewed the radiological images as listed and agreed with the findings in the report. No results found.    Orders Placed This Encounter  Procedures   MM DIAG BREAST TOMO BILATERAL    Standing Status:   Future    Standing Expiration Date:   10/06/2023    Order Specific Question:   Reason for Exam (SYMPTOM  OR DIAGNOSIS REQUIRED)    Answer:   screening    Order Specific Question:   Preferred imaging location?     Answer:   [facility]    Order Specific Question:   Is the patient pregnant?    Answer:   No   DG Bone Density    Standing Status:   Future    Standing Expiration Date:   10/05/2023    Order Specific Question:   Reason for Exam (SYMPTOM  OR DIAGNOSIS REQUIRED)    Answer:   screening    Order Specific Question:   Is the patient pregnant?    Answer:   No    Order Specific Question:   Preferred imaging location?    Answer:   [facility]   FSH-Follicle stimulating hormone    Add to lab draw today    Standing Status:   Future    Number of Occurrences:   1    Standing Expiration Date:   10/05/2023   All questions were answered. The patient knows to call the clinic with any problems, questions or concerns. No barriers to learning was detected. The total time spent in the appointment was 30 minutes.     Truitt Merle, MD 10/05/2022   I, Maurine Simmering, CMA, am acting as scribe for Truitt Merle, MD.   I have reviewed the above documentation for accuracy  and completeness, and I agree with the above.

## 2022-10-06 LAB — FOLLICLE STIMULATING HORMONE: FSH: 137 m[IU]/mL

## 2022-10-11 ENCOUNTER — Other Ambulatory Visit: Payer: Self-pay | Admitting: Hematology

## 2022-10-11 MED ORDER — ANASTROZOLE 1 MG PO TABS: 1.0000 mg | ORAL_TABLET | Freq: Every day | ORAL | 3 refills | Status: DC

## 2022-10-12 ENCOUNTER — Inpatient Hospital Stay: Payer: BC Managed Care – PPO

## 2022-10-12 ENCOUNTER — Telehealth: Payer: Self-pay

## 2022-10-12 NOTE — Telephone Encounter (Addendum)
Called patient and relayed message bellow. Patient voiced full understanding.      ----- Message from Truitt Merle, MD sent at 10/11/2022 12:57 PM EST ----- Please call pt and let her know that lab test showed she is menopausal now, I have called in anastrozole to her pharmacy [facility], please let her start.thanks   Truitt Merle

## 2022-10-13 ENCOUNTER — Other Ambulatory Visit: Payer: Self-pay | Admitting: Internal Medicine

## 2022-10-14 NOTE — Telephone Encounter (Signed)
Requested Prescriptions  Pending Prescriptions Disp Refills   valACYclovir (VALTREX) 500 MG tablet [Pharmacy Med Name: VALACYCLOVIR HCL 500 MG TABLET] 90 tablet 0    Sig: TAKE 1 TABLET (500 MG TOTAL) BY MOUTH DAILY AS NEEDED (HERPES SIMPLEX).     Antimicrobials:  Antiviral Agents - Anti-Herpetic Passed - 10/13/2022  1:39 AM      Passed - Valid encounter within last 12 months    Recent Outpatient Visits           1 month ago Flu-like symptoms   [facility] [place], [place] W, NP   3 months ago Chronic migraine without aura with status migrainosus, not intractable   [facility] [place], Coralie Keens, NP   3 months ago Encounter for general adult medical examination with abnormal findings   [facility] [place], Coralie Keens, NP   4 months ago [facility] [place], Coralie Keens, NP   6 months ago Screen for colon cancer   [facility] [place], Coralie Keens, [place]

## 2022-10-15 ENCOUNTER — Encounter: Payer: Self-pay | Admitting: Hematology

## 2022-10-15 ENCOUNTER — Inpatient Hospital Stay: Payer: BC Managed Care – PPO | Attending: Hematology

## 2022-10-15 ENCOUNTER — Other Ambulatory Visit: Payer: Self-pay

## 2022-10-15 DIAGNOSIS — D519 Vitamin B12 deficiency anemia, unspecified: Secondary | ICD-10-CM

## 2022-10-15 DIAGNOSIS — E538 Deficiency of other specified B group vitamins: Secondary | ICD-10-CM | POA: Diagnosis present

## 2022-10-15 MED ORDER — CYANOCOBALAMIN 1000 MCG/ML IJ SOLN
1000.0000 ug | Freq: Once | INTRAMUSCULAR | Status: AC
  Administered 2022-10-15: 1000 ug via INTRAMUSCULAR
  Filled 2022-10-15: qty 1

## 2022-10-15 NOTE — Patient Instructions (Signed)
Vitamin B12 Deficiency Vitamin B12 deficiency occurs when the body does not have enough of this important vitamin. The body needs this vitamin: To make red blood cells. To make DNA. This is the genetic material inside cells. To help the nerves work properly so they can carry messages from the brain to the body. Vitamin B12 deficiency can cause health problems, such as not having enough red blood cells in the blood (anemia). This can lead to nerve damage if untreated. What are the causes? This condition may be caused by: Not eating enough foods that contain vitamin B12. Not having enough stomach acid and digestive fluids to properly absorb vitamin B12 from the food that you eat. Having certain diseases that make it hard to absorb vitamin B12. These diseases include Crohn's disease, chronic pancreatitis, and cystic fibrosis. An autoimmune disorder in which the body does not make enough of a protein (intrinsic factor) within the stomach, resulting in not enough absorption of vitamin B12. Having a surgery in which part of the stomach or small intestine is removed. Taking certain medicines that make it hard for the body to absorb vitamin B12. These include: Heartburn medicines, such as antacids and proton pump inhibitors. Some medicines that are used to treat diabetes. What increases the risk? The following factors may make you more likely to develop a vitamin B12 deficiency: Being an older adult. Eating a vegetarian or vegan diet that does not include any foods that come from animals. Eating a poor diet while you are pregnant. Taking certain medicines. Having alcoholism. What are the signs or symptoms? In some cases, there are no symptoms of this condition. If the condition leads to anemia or nerve damage, various symptoms may occur, such as: Weakness. Tiredness (fatigue). Loss of appetite. Numbness or tingling in your hands and feet. Redness and burning of the tongue. Depression,  confusion, or memory problems. Trouble walking. If anemia is severe, symptoms can include: Shortness of breath. Dizziness. Rapid heart rate. How is this diagnosed? This condition may be diagnosed with a blood test to measure the level of vitamin B12 in your blood. You may also have other tests, including: A group of tests that measure certain characteristics of blood cells (complete blood count, CBC). A blood test to measure intrinsic factor. A procedure where a thin tube with a camera on the end is used to look into your stomach or intestines (endoscopy). Other tests may be needed to discover the cause of the deficiency. How is this treated? Treatment for this condition depends on the cause. This condition may be treated by: Changing your eating and drinking habits, such as: Eating more foods that contain vitamin B12. Drinking less alcohol or no alcohol. Getting vitamin B12 injections. Taking vitamin B12 supplements by mouth (orally). Your health care provider will tell you which dose is best for you. Follow these instructions at home: Eating and drinking  Include foods in your diet that come from animals and contain a lot of vitamin B12. These include: Meats and poultry. This includes beef, pork, chicken, turkey, and organ meats, such as liver. Seafood. This includes clams, rainbow trout, salmon, tuna, and haddock. Eggs. Dairy foods such as milk, yogurt, and cheese. Eat foods that have vitamin B12 added to them (are fortified), such as ready-to-eat breakfast cereals. Check the label on the package to see if a food is fortified. The items listed above may not be a complete list of foods and beverages you can eat and drink. Contact a dietitian for   more information. Alcohol use Do not drink alcohol if: Your health care provider tells you not to drink. You are pregnant, may be pregnant, or are planning to become pregnant. If you drink alcohol: Limit how much you have to: 0-1 drink a  day for women. 0-2 drinks a day for men. Know how much alcohol is in your drink. In the [place] one drink equals one 12 oz bottle of beer (355 mL), one 5 oz glass of wine (148 mL), or one 1 oz glass of hard liquor (44 mL). General instructions Get vitamin B12 injections if told to by your health care provider. Take supplements only as told by your health care provider. Follow the directions carefully. Keep all follow-up visits. This is important. Contact a health care provider if: Your symptoms come back. Your symptoms get worse or do not improve with treatment. Get help right away: You develop shortness of breath. You have a rapid heart rate. You have chest pain. You become dizzy or you faint. These symptoms may be an emergency. Get help right away. Call 911. Do not wait to see if the symptoms will go away. Do not drive yourself to the hospital. Summary Vitamin B12 deficiency occurs when the body does not have enough of this important vitamin. Common causes include not eating enough foods that contain vitamin B12, not being able to absorb vitamin B12 from the food that you eat, having a surgery in which part of the stomach or small intestine is removed, or taking certain medicines. Eat foods that have vitamin B12 in them. Treatment may include making a change in the way you eat and drink, getting vitamin B12 injections, or taking vitamin B12 supplements. This information is not intended to replace advice given to you by your health care provider. Make sure you discuss any questions you have with your health care provider. Document Revised: 04/18/2021 Document Reviewed: 04/18/2021 Elsevier Patient Education  2023 Elsevier Inc.  

## 2022-11-05 ENCOUNTER — Other Ambulatory Visit: Payer: Self-pay | Admitting: Hematology

## 2022-11-09 ENCOUNTER — Ambulatory Visit: Payer: BC Managed Care – PPO

## 2022-11-12 ENCOUNTER — Inpatient Hospital Stay: Payer: BC Managed Care – PPO | Attending: Hematology

## 2022-11-13 LAB — COLOGUARD: COLOGUARD: NEGATIVE

## 2022-11-24 ENCOUNTER — Other Ambulatory Visit: Payer: Self-pay | Admitting: Internal Medicine

## 2022-11-24 NOTE — Telephone Encounter (Signed)
Requested Prescriptions  Pending Prescriptions Disp Refills   levothyroxine (SYNTHROID) 100 MCG tablet [Pharmacy Med Name: LEVOTHYROXINE 100 MCG TABLET] 90 tablet 0    Sig: TAKE 1 TABLET BY MOUTH EVERY DAY     Endocrinology:  Hypothyroid Agents Passed - 11/24/2022  1:44 AM      Passed - TSH in normal range and within 360 days    TSH  Date Value Ref Range Status  04/15/2022 0.37 (L) mIU/L Final    Comment:              Reference Range .           > or = 20 Years  0.40-4.50 .                Pregnancy Ranges           First trimester    0.26-2.66           Second trimester   0.55-2.73           Third trimester    0.43-2.91          Passed - Valid encounter within last 12 months    Recent Outpatient Visits           3 months ago Flu-like symptoms   [facility] [place], [place] W, NP   4 months ago Chronic migraine without aura with status migrainosus, not intractable   [facility] [place], Coralie Keens, NP   5 months ago Encounter for general adult medical examination with abnormal findings   [facility] [place], Coralie Keens, NP   5 months ago [facility] [place], Coralie Keens, NP   7 months ago Screen for colon cancer   [facility] [place], Coralie Keens, [place]

## 2022-12-07 ENCOUNTER — Ambulatory Visit: Payer: BC Managed Care – PPO

## 2022-12-10 ENCOUNTER — Inpatient Hospital Stay: Payer: BC Managed Care – PPO | Attending: Hematology

## 2022-12-10 ENCOUNTER — Encounter: Payer: Self-pay | Admitting: Hematology

## 2022-12-10 ENCOUNTER — Ambulatory Visit
Admission: RE | Admit: 2022-12-10 | Discharge: 2022-12-10 | Disposition: A | Discharge status: Home / Self Care | Payer: BC Managed Care – PPO | Source: Ambulatory Visit | Attending: Hematology | Admitting: Hematology

## 2022-12-10 DIAGNOSIS — D519 Vitamin B12 deficiency anemia, unspecified: Secondary | ICD-10-CM

## 2022-12-10 DIAGNOSIS — E538 Deficiency of other specified B group vitamins: Secondary | ICD-10-CM | POA: Insufficient documentation

## 2022-12-10 DIAGNOSIS — E2839 Other primary ovarian failure: Secondary | ICD-10-CM

## 2022-12-10 MED ORDER — CYANOCOBALAMIN 1000 MCG/ML IJ SOLN
1000.0000 ug | Freq: Once | INTRAMUSCULAR | Status: AC
  Administered 2022-12-10: 1000 ug via INTRAMUSCULAR
  Filled 2022-12-10: qty 1

## 2022-12-21 ENCOUNTER — Ambulatory Visit
Admission: RE | Admit: 2022-12-21 | Discharge: 2022-12-21 | Disposition: A | Discharge status: Home / Self Care | Payer: BC Managed Care – PPO | Source: Ambulatory Visit | Attending: Hematology | Admitting: Hematology

## 2022-12-21 DIAGNOSIS — Z170 Estrogen receptor positive status [ER+]: Secondary | ICD-10-CM

## 2022-12-26 ENCOUNTER — Other Ambulatory Visit: Payer: Self-pay | Admitting: Internal Medicine

## 2022-12-28 NOTE — Telephone Encounter (Signed)
Requested Prescriptions  Pending Prescriptions Disp Refills   omeprazole (PRILOSEC) 20 MG capsule [Pharmacy Med Name: OMEPRAZOLE DR 20 MG CAPSULE] 90 capsule 1    Sig: TAKE 1 CAPSULE BY MOUTH EVERY DAY     Gastroenterology: Proton Pump Inhibitors Passed - 12/26/2022  9:02 AM      Passed - Valid encounter within last 12 months    Recent Outpatient Visits           4 months ago Flu-like symptoms   [facility] [place], [place] W, NP   5 months ago Chronic migraine without aura with status migrainosus, not intractable   [facility] [place], Salvad[place] Oxford, NP   6 months ago Encounter for general adult medical examination with abnormal findings   [facility] [place], Salvadore Oxford, NP   6 months ago Hoarseness    [facility] [place], Salvadore Oxford, NP   8 months ago Screen for colon cancer    [facility] [place], Salvadore Oxford, NP       Future Appointments             In 1 month Sherie Don, NP [facility]

## 2023-01-11 ENCOUNTER — Ambulatory Visit: Payer: BC Managed Care – PPO

## 2023-01-14 ENCOUNTER — Inpatient Hospital Stay: Payer: BC Managed Care – PPO | Attending: Hematology

## 2023-01-14 ENCOUNTER — Ambulatory Visit: Payer: BC Managed Care – PPO | Admitting: Internal Medicine

## 2023-01-14 VITALS — BP 128/82 | HR 86 | Temp 96.6°F | Wt 227.0 lb

## 2023-01-14 VITALS — BP 144/100 | HR 84 | Temp 98.5°F | Resp 18

## 2023-01-14 DIAGNOSIS — K219 Gastro-esophageal reflux disease without esophagitis: Secondary | ICD-10-CM

## 2023-01-14 DIAGNOSIS — R7303 Prediabetes: Secondary | ICD-10-CM

## 2023-01-14 DIAGNOSIS — Z853 Personal history of malignant neoplasm of breast: Secondary | ICD-10-CM

## 2023-01-14 DIAGNOSIS — I10 Essential (primary) hypertension: Secondary | ICD-10-CM

## 2023-01-14 DIAGNOSIS — E538 Deficiency of other specified B group vitamins: Secondary | ICD-10-CM | POA: Insufficient documentation

## 2023-01-14 DIAGNOSIS — B009 Herpesviral infection, unspecified: Secondary | ICD-10-CM

## 2023-01-14 DIAGNOSIS — D508 Other iron deficiency anemias: Secondary | ICD-10-CM

## 2023-01-14 DIAGNOSIS — I493 Ventricular premature depolarization: Secondary | ICD-10-CM

## 2023-01-14 DIAGNOSIS — D519 Vitamin B12 deficiency anemia, unspecified: Secondary | ICD-10-CM

## 2023-01-14 DIAGNOSIS — G44221 Chronic tension-type headache, intractable: Secondary | ICD-10-CM

## 2023-01-14 DIAGNOSIS — E7800 Pure hypercholesterolemia, unspecified: Secondary | ICD-10-CM

## 2023-01-14 DIAGNOSIS — E039 Hypothyroidism, unspecified: Secondary | ICD-10-CM

## 2023-01-14 MED ORDER — CYANOCOBALAMIN 1000 MCG/ML IJ SOLN
1000.0000 ug | Freq: Once | INTRAMUSCULAR | Status: AC
  Administered 2023-01-14: 1000 ug via INTRAMUSCULAR
  Filled 2023-01-14: qty 1

## 2023-01-14 NOTE — Assessment & Plan Note (Signed)
Continue cyclobenzaprine, Fioricet, rizatriptan and sumatriptan as needed

## 2023-01-14 NOTE — Assessment & Plan Note (Signed)
Encourage low-carb diet and exercise for weight loss 

## 2023-01-14 NOTE — Assessment & Plan Note (Signed)
Not taking anastrozole, she will notify oncology Taking gabapentin as needed for breast pain

## 2023-01-14 NOTE — Progress Notes (Signed)
Subjective:    Patient ID: [name], female    DOB: 1971/01/27, 52 y.o.   MRN: [id number]  HPI  Patient presents to clinic today for 64-month follow-up of chronic conditions.  Hypothyroidism: She does report that she has been feeling more tired lately.  She is taking Levothyroxine as prescribed.  She does not follow with endocrinology.  Migraines: These occur 3 x month.  Triggered by stress and weather.  She takes Cyclobenzaprine, Fioricet and alternates Rizatriptan/Sumatriptan with good relief of symptoms.  She does not follow with neurology.  Anxiety: Persistent, managed on Venlafaxine and Xanax.  She is not currently seeing a therapist.  She denies depression, SI/HI.  HLD: Her last LDL was 174, triglycerides 158, 04/2022.  She is not taking Atorvastatin as prescribed.  She tries to consume low-fat diet.   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  She takes Valacyclovir as needed for outbreaks.  History of Left Breast Cancer: Status postlumpectomy and radiation.  She is not taking Anastrozole as prescribed but is taking Gabapentin as needed for breast pain.  She follows with oncology.  PVCs: She failed beta-blockers in the past.  She is taking Flecainide as prescribed by cardiology.  ECG from 10/2021 reviewed.  Iron/B12 Deficiency Anemia: Her last H/H was 12.6/36.3, 09/2022.  She is not currently taking any oral iron.  She follows with hematology.  GERD: Triggered by spicy foods.  She takes Omeprazole only as needed.  There is no upper GI on file.  HTN: Her BP today is 128/82.  She is taking Losartan as prescribed.  ECG from 10/2021 reviewed.  Prediabetes: Her last A1c was 5.2%, 04/2022.  She is not taking any oral diabetic medication at this time.  She does not check her sugars.  Review of Systems     Past Medical History:  Diagnosis Date   Allergy    Anemia    present for many years, mild, stable   Anxiety    Breast cancer (HCC)    Breast cancer (HCC) 12/18/2020    Dysrhythmia    PAC's on flecanide (did not tolerate beta blockers)   Family history of breast cancer 01/20/2021   Family history of prostate cancer 01/20/2021   Frequent headaches    HSV infection    fever blisters   Hypertension    Hypothyroidism    Personal history of radiation therapy     Current Outpatient Medications  Medication Sig Dispense Refill   ALPRAZolam (XANAX) 0.25 MG tablet Take 1 tablet (0.25 mg total) by mouth 2 (two) times daily as needed. 20 tablet 0   anastrozole (ARIMIDEX) 1 MG tablet Take 1 tablet (1 mg total) by mouth daily. 30 tablet 3   atorvastatin (LIPITOR) 10 MG tablet TAKE 1 TABLET BY MOUTH EVERY DAY 90 tablet 1   butalbital-acetaminophen-caffeine (FIORICET) 50-325-40 MG tablet Take 1 tablet by mouth every 6 (six) hours as needed for headache. 20 tablet 5   cyclobenzaprine (FLEXERIL) 10 MG tablet Take 1 tablet (10 mg total) by mouth daily as needed (migraines). 15 tablet 5   flecainide (TAMBOCOR) 50 MG tablet Take 1 tablet (50 mg total) by mouth 2 (two) times daily. 180 tablet 2   fluticasone (FLONASE) 50 MCG/ACT nasal spray SPRAY 2 SPRAYS INTO EACH NOSTRIL EVERY DAY 48 mL 0   gabapentin (NEURONTIN) 100 MG capsule Take 1 capsule (100 mg total) by mouth 3 (three) times daily as needed. Use in addition to 300mg  dose 90 capsule 1   gabapentin (  NEURONTIN) 300 MG capsule TAKE 1 CAPSULE BY MOUTH TWICE A DAY 60 capsule 1   KLOR-CON M10 10 MEQ tablet TAKE 1 TABLET BY MOUTH TWICE A DAY 180 tablet 1   levocetirizine (XYZAL) 5 MG tablet TAKE 1 TABLET BY MOUTH EVERY DAY IN THE EVENING 90 tablet 3   levothyroxine (SYNTHROID) 100 MCG tablet TAKE 1 TABLET BY MOUTH EVERY DAY 90 tablet 0   LORazepam (ATIVAN) 0.5 MG tablet Take 0.5 mg by mouth every 8 (eight) hours as needed.     losartan (COZAAR) 50 MG tablet Take 1 tablet (50 mg total) by mouth daily. 90 tablet 3   omeprazole (PRILOSEC) 20 MG capsule TAKE 1 CAPSULE BY MOUTH EVERY DAY 90 capsule 1   oseltamivir (TAMIFLU)  75 MG capsule Take 1 capsule (75 mg total) by mouth 2 (two) times daily. For 5 days 10 capsule 0   promethazine-dextromethorphan (PROMETHAZINE-DM) 6.25-15 MG/5ML syrup Take 5 mLs by mouth 4 (four) times daily as needed. 118 mL 0   rizatriptan (MAXALT-MLT) 10 MG disintegrating tablet Take 1 tablet (10 mg total) by mouth as needed for migraine. May repeat in 2 hours if needed 10 tablet 1   SUMAtriptan (IMITREX) 25 MG tablet TAKE 1 TABLET (25 MG TOTAL) BY MOUTH EVERY 2 (TWO) HOURS AS NEEDED FOR MIGRAINE. MAY REPEAT IN 2 HOURS IF HEADACHE PERSISTS OR RECURS. 10 tablet 0   traMADol (ULTRAM) 50 MG tablet Take 1 tablet (50 mg total) by mouth every 12 (twelve) hours as needed. 20 tablet 0   valACYclovir (VALTREX) 500 MG tablet TAKE 1 TABLET (500 MG TOTAL) BY MOUTH DAILY AS NEEDED (HERPES SIMPLEX). 90 tablet 0   venlafaxine XR (EFFEXOR-XR) 75 MG 24 hr capsule TAKE 1 CAPSULE BY MOUTH DAILY WITH BREAKFAST. 90 capsule 1   No current facility-administered medications for this visit.    Allergies  Allergen Reactions   Ciprofloxacin     anxiety   Keflex [Cephalexin] Hives   Trazodone And Nefazodone Other (See Comments)    nightmares    Family History  Problem Relation Age of Onset   Depression Mother    Breast cancer Mother 87   Hyperlipidemia Father    Prostate cancer Father 50    Social History   Socioeconomic History   Marital status: Single    Spouse name: Not on file   Number of children: 1   Years of education: Not on file   Highest education level: Not on file  Occupational History   Not on file  Tobacco Use   Smoking status: Never   Smokeless tobacco: Never  Vaping Use   Vaping Use: Never used  Substance and Sexual Activity   Alcohol use: Yes    Comment: occasional   Drug use: No   Sexual activity: Yes    Partners: Male    Birth control/protection: Pill  Other Topics Concern   Not on file  Social History Narrative   Not on file   Social Determinants of Health    Financial Resource Strain: Not on file  Food Insecurity: Not on file  Transportation Needs: Not on file  Physical Activity: Not on file  Stress: Not on file  Social Connections: Not on file  Intimate Partner Violence: Not At Risk (01/02/2021)   Humiliation, Afraid, Rape, and Kick questionnaire    Fear of Current or Ex-Partner: No    Emotionally Abused: No    Physically Abused: No    Sexually Abused: No  Constitutional: Patient reports fatigue, intermittent headaches.  Denies fever, malaise, or abrupt weight changes.  HEENT: Denies eye pain, eye redness, ear pain, ringing in the ears, wax buildup, runny nose, nasal congestion, bloody nose, or sore throat. Respiratory: Denies difficulty breathing, shortness of breath, cough or sputum production.   Cardiovascular: Denies chest pain, chest tightness, palpitations or swelling in the hands or feet.  Gastrointestinal: Patient reports intermittent reflux.  Denies abdominal pain, bloating, constipation, diarrhea or blood in the stool.  GU: Denies urgency, frequency, pain with urination, burning sensation, blood in urine, odor or discharge. Musculoskeletal: Denies decrease in range of motion, difficulty with gait, muscle pain or joint pain and swelling.  Skin: Denies redness, rashes, lesions or ulcercations.  Neurological: Denies dizziness, difficulty with memory, difficulty with speech or problems with balance and coordination.  Psych: Patient has a history of anxiety.  Denies depression, SI/HI.  No other specific complaints in a complete review of systems (except as listed in HPI above).  Objective:   Physical Exam   BP 128/82 (BP Location: Left Arm, Patient Position: Sitting, Cuff Size: Large)   Pulse 86   Temp (!) 96.6 F (35.9 C) (Temporal)   Wt 227 lb (103 kg)   SpO2 97%   BMI 37.77 kg/m   Wt Readings from Last 3 Encounters:  10/05/22 231 lb 4.8 oz (104.9 kg)  08/19/22 222 lb (100.7 kg)  07/02/22 216 lb (98 kg)     General: Appears her stated age, obese, in NAD. Skin: Warm, dry and intact.  HEENT: Head: normal shape and size; Eyes: sclera white, no icterus, conjunctiva pink, PERRLA and EOMs intact;  Neck:  Neck supple, trachea midline. No masses, lumps or thyromegaly present.  Cardiovascular: Normal rate and rhythm. S1,S2 noted.  No murmur, rubs or gallops noted. No JVD or BLE edema. No carotid bruits noted. Pulmonary/Chest: Normal effort and positive vesicular breath sounds. No respiratory distress. No wheezes, rales or ronchi noted.  Abdomen: Soft and nontender. Normal bowel sounds.  Musculoskeletal:  No difficulty with gait.  Neurological: Alert and oriented. Coordination normal.  Psychiatric: Mood and affect normal. Behavior is normal. Judgment and thought content normal.    BMET    Component Value Date/Time   NA 139 10/05/2022 1339   NA 143 01/26/2020 1600   K 3.8 10/05/2022 1339   CL 103 10/05/2022 1339   CO2 30 10/05/2022 1339   GLUCOSE 69 (L) 10/05/2022 1339   BUN 10 10/05/2022 1339   BUN 11 01/26/2020 1600   CREATININE 0.92 10/05/2022 1339   CREATININE 0.88 04/15/2022 1113   CALCIUM 9.4 10/05/2022 1339   GFRNONAA >60 10/05/2022 1339   GFRAA >60 02/25/2020 2231    Lipid Panel     Component Value Date/Time   CHOL 263 (H) 04/15/2022 1113   TRIG 158 (H) 04/15/2022 1113   HDL 59 04/15/2022 1113   CHOLHDL 4.5 04/15/2022 1113   VLDL 18.8 12/21/2019 1204   LDLCALC 174 (H) 04/15/2022 1113    CBC    Component Value Date/Time   WBC 4.9 10/05/2022 1339   WBC 5.7 06/26/2022 2248   RBC 4.23 10/05/2022 1339   HGB 12.6 10/05/2022 1339   HGB 10.1 (L) 04/25/2020 1154   HCT 36.3 10/05/2022 1339   HCT 31.6 (L) 04/25/2020 1154   PLT 223 10/05/2022 1339   PLT 261 04/25/2020 1154   MCV 85.8 10/05/2022 1339   MCV 80 04/25/2020 1154   MCH 29.8 10/05/2022 1339   MCHC 34.7 10/05/2022  1339   RDW 14.4 10/05/2022 1339   RDW 16.8 (H) 04/25/2020 1154   LYMPHSABS 1.3 10/05/2022 1339    LYMPHSABS 1.4 04/25/2020 1154   MONOABS 0.3 10/05/2022 1339   EOSABS 0.1 10/05/2022 1339   EOSABS 0.0 04/25/2020 1154   BASOSABS 0.1 10/05/2022 1339   BASOSABS 0.1 04/25/2020 1154    Hgb A1C Lab Results  Component Value Date   HGBA1C 5.2 04/15/2022           Assessment & Plan:    RTC in 6 months for annual exam Nicki Reaper, NP

## 2023-01-14 NOTE — Assessment & Plan Note (Signed)
Controlled on losartan Reinforced DASH diet and exercise for weight loss C-Met today 

## 2023-01-14 NOTE — Assessment & Plan Note (Signed)
C-Met and lipid profile today Encouraged her to consume a low-fat diet Will likely need to start atorvastatin based on labs

## 2023-01-14 NOTE — Assessment & Plan Note (Signed)
Will check B12 level today.

## 2023-01-14 NOTE — Assessment & Plan Note (Signed)
CBC and iron panel today 

## 2023-01-14 NOTE — Patient Instructions (Signed)

## 2023-01-14 NOTE — Assessment & Plan Note (Signed)
TSH and free T4 today We will adjust levothyroxine if needed based on labs 

## 2023-01-14 NOTE — Assessment & Plan Note (Signed)
Continue flecainide per cardiology

## 2023-01-14 NOTE — Assessment & Plan Note (Signed)
Continue valacyclovir as needed 

## 2023-01-14 NOTE — Assessment & Plan Note (Signed)
Avoid foods that trigger reflux Encourage weight loss as this can help reduce reflux symptoms Continue omeprazole as needed 

## 2023-01-15 ENCOUNTER — Encounter: Payer: Self-pay | Admitting: Internal Medicine

## 2023-01-15 DIAGNOSIS — E039 Hypothyroidism, unspecified: Secondary | ICD-10-CM

## 2023-01-15 DIAGNOSIS — R7303 Prediabetes: Secondary | ICD-10-CM

## 2023-01-15 LAB — CBC
HCT: 37.6 % (ref 35.0–45.0)
Hemoglobin: 12.3 g/dL (ref 11.7–15.5)
MCH: 29.2 pg (ref 27.0–33.0)
MCHC: 32.7 g/dL (ref 32.0–36.0)
MCV: 89.3 fL (ref 80.0–100.0)
MPV: 9.8 fL (ref 7.5–12.5)
Platelets: 275 10*3/uL (ref 140–400)
RBC: 4.21 10*6/uL (ref 3.80–5.10)
RDW: 14.4 % (ref 11.0–15.0)
WBC: 4.3 10*3/uL (ref 3.8–10.8)

## 2023-01-15 LAB — COMPLETE METABOLIC PANEL WITH GFR
AG Ratio: 1.5 (calc) (ref 1.0–2.5)
ALT: 8 U/L (ref 6–29)
AST: 12 U/L (ref 10–35)
Albumin: 4.1 g/dL (ref 3.6–5.1)
Alkaline phosphatase (APISO): 105 U/L (ref 37–153)
BUN: 11 mg/dL (ref 7–25)
CO2: 28 mmol/L (ref 20–32)
Calcium: 9.4 mg/dL (ref 8.6–10.4)
Chloride: 103 mmol/L (ref 98–110)
Creat: 0.94 mg/dL (ref 0.50–1.03)
Globulin: 2.7 g/dL (calc) (ref 1.9–3.7)
Glucose, Bld: 97 mg/dL (ref 65–99)
Potassium: 4 mmol/L (ref 3.5–5.3)
Sodium: 140 mmol/L (ref 135–146)
Total Bilirubin: 0.3 mg/dL (ref 0.2–1.2)
Total Protein: 6.8 g/dL (ref 6.1–8.1)
eGFR: 74 mL/min/{1.73_m2} (ref >=60)

## 2023-01-15 LAB — IRON,TIBC AND FERRITIN PANEL
%SAT: 21 % (calc) (ref 16–45)
Ferritin: 58 ng/mL (ref 16–232)
Iron: 67 ug/dL (ref 45–160)
TIBC: 326 mcg/dL (calc) (ref 250–450)

## 2023-01-15 LAB — VITAMIN D 25 HYDROXY (VIT D DEFICIENCY, FRACTURES): Vit D, 25-Hydroxy: 28 ng/mL — ABNORMAL LOW (ref 30–100)

## 2023-01-15 LAB — LIPID PANEL
Cholesterol: 220 mg/dL — ABNORMAL HIGH (ref <200)
HDL: 51 mg/dL (ref >=50)
LDL Cholesterol (Calc): 142 mg/dL (calc) — ABNORMAL HIGH
Non-HDL Cholesterol (Calc): 169 mg/dL (calc) — ABNORMAL HIGH (ref <130)
Total CHOL/HDL Ratio: 4.3 (calc) (ref <5.0)
Triglycerides: 142 mg/dL (ref <150)

## 2023-01-15 LAB — HEMOGLOBIN A1C
Hgb A1c MFr Bld: 5.7 % of total Hgb — ABNORMAL HIGH (ref <5.7)
Mean Plasma Glucose: 117 mg/dL
eAG (mmol/L): 6.5 mmol/L

## 2023-01-15 LAB — VITAMIN B12: Vitamin B-12: 313 pg/mL (ref 200–1100)

## 2023-01-15 LAB — T4, FREE: Free T4: 1.2 ng/dL (ref 0.8–1.8)

## 2023-01-15 LAB — TSH: TSH: 0.04 mIU/L — ABNORMAL LOW

## 2023-01-15 MED ORDER — LEVOTHYROXINE SODIUM 88 MCG PO TABS: 88.0000 ug | ORAL_TABLET | Freq: Every day | ORAL | 0 refills | Status: DC

## 2023-01-17 ENCOUNTER — Other Ambulatory Visit: Payer: Self-pay | Admitting: Internal Medicine

## 2023-01-18 ENCOUNTER — Encounter: Payer: Self-pay | Admitting: Internal Medicine

## 2023-01-18 ENCOUNTER — Ambulatory Visit (INDEPENDENT_AMBULATORY_CARE_PROVIDER_SITE_OTHER): Payer: BC Managed Care – PPO | Admitting: Internal Medicine

## 2023-01-18 ENCOUNTER — Ambulatory Visit: Payer: Self-pay

## 2023-01-18 VITALS — BP 122/82 | HR 92 | Temp 96.4°F | Wt 221.0 lb

## 2023-01-18 DIAGNOSIS — M542 Cervicalgia: Secondary | ICD-10-CM

## 2023-01-18 DIAGNOSIS — G44311 Acute post-traumatic headache, intractable: Secondary | ICD-10-CM

## 2023-01-18 DIAGNOSIS — W19XXXA Unspecified fall, initial encounter: Secondary | ICD-10-CM | POA: Diagnosis not present

## 2023-01-18 NOTE — Telephone Encounter (Signed)
  Chief Complaint: fall Symptoms: fall, fell backwards, hit head on ground, HA today  Frequency: yesterday fall Pertinent Negatives: NA Disposition: [] ED /[] Urgent Care (no appt availability in office) / [x] Appointment(In office/virtual)/ []  [facility]/ [] Home Care/ [x] Refused Recommended Disposition /[] [facility]/ []  Follow-up with PCP Additional Notes: pt states she fell in a whole in the yard and tripped backwards and hit head on ground, denies loss of consciousness. Reports HA today, advised ED but pt refused, doesn't want to go sit in waiting room for hours, preferred OV. Scheduled for 1140 today.   Reason for Disposition  [1] SEVERE headache AND [2] not improved 2 hours after pain medicine/ice packs  Answer Assessment - Initial Assessment Questions 1. MECHANISM: "How did the injury happen?" For falls, ask: "What height did you fall from?" and "What surface did you fall against?"      Fell backwards and hit head on on ground  2. ONSET: "When did the injury happen?" (Minutes or hours ago)      Yesterday  3. NEUROLOGIC SYMPTOMS: "Was there any loss of consciousness?" "Are there any other neurological symptoms?"      no 5. LOCATION: "What part of the head was hit?"      Back  8. PAIN: "Is there any pain?" If Yes, ask: "How bad is it?"  (e.g., Scale 1-10; or mild, moderate, severe)     9/10 10. OTHER SYMPTOMS: "Do you have any other symptoms?" (e.g., neck pain, vomiting)       HA  Protocols used: Head Injury-A-AH

## 2023-01-18 NOTE — Patient Instructions (Signed)
Head Injury, Adult There are many types of head injuries. They can be as minor as a small bump. Some head injuries can be worse. Worse injuries include: A strong hit to the head that shakes the brain back and forth, causing damage (concussion). A bruise (contusion) of the brain. This means there is bleeding in the brain that can cause swelling. A cracked skull (skull fracture). Bleeding in the brain that gathers, gets thick (makes a clot), and forms a bump (hematoma). Most problems from a head injury come in the first 24 hours. However, you may still have side effects up to 7-10 days after your injury. It is important to watch your condition for any changes. You may need to be watched in the emergency department or urgent care, or you may need to stay in the hospital. What are the causes? There are many possible causes of a head injury. A serious head injury may be caused by: A car accident. Bicycle or motorcycle accidents. Sports injuries. Falls. Being hit by an object. What are the signs or symptoms? Symptoms of a head injury include a bruise, bump, or bleeding where the injury happened. Other physical symptoms may include: Headache. Feeling like you may vomit (nauseous) or vomiting. Dizziness. Blurred or double vision. Being uncomfortable around bright lights or loud noises. Shaking movements that you cannot control (seizures). Feeling tired. Trouble being woken up. Fainting or loss of consciousness. Mental or emotional symptoms may include: Feeling grumpy or cranky. Confusion and memory problems. Having trouble paying attention or concentrating. Changes in eating or sleeping habits. Feeling worried or nervous (anxious). Feeling sad (depressed). How is this treated? Treatment for this condition depends on how severe the injury is and the type of injury you have. The main goal is to prevent problems and to allow the brain time to heal. Mild head injury If you have a mild head  injury, you may be sent home, and treatment may include: Being watched. A responsible adult should stay with you for 24 hours after your injury and check on you often. Physical rest. Brain rest. Pain medicines. Severe head injury If you have a severe head injury, treatment may include: Being watched closely. This includes staying in the hospital. Medicines to: Help with pain. Prevent seizures. Help with brain swelling. Protecting your airway and using a machine that helps you breathe (ventilator). Treatments to watch for and manage swelling inside the brain. Brain surgery. This may be needed to: Remove a collection of blood or blood clots. Stop the bleeding. Remove a part of the skull. This allows room for the brain to swell. Follow these instructions at home: Activity Rest. Avoid activities that are hard or tiring. Make sure you get enough sleep. Let your brain rest. Do this by limiting activities that need a lot of thought or attention, such as: Watching TV. Playing memory games and puzzles. Job-related work or homework. Working on the computer, social media, and texting. Avoid activities that could cause another head injury until your doctor says it is okay. This includes playing sports. Having another head injury, especially before the first one has healed, can be dangerous. Ask your doctor when it is safe for you to go back to your normal activities, such as work or school. Ask your doctor for a step-by-step plan for slowly going back to your normal activities. Ask your doctor when you can drive, ride a bicycle, or use heavy machinery. Do not do these activities if you are dizzy. Lifestyle  Do   not drink alcohol until your doctor says it is okay. Do not use drugs. If it is harder than usual to remember things, write them down. If you are easily distracted, try to do one thing at a time. Talk with family members or close friends when making important decisions. Tell your  friends, family, a trusted co-worker, and work manager about your injury, symptoms, and limits (restrictions). Have them watch for any problems that are new or getting worse. General instructions Take over-the-counter and prescription medicines only as told by your doctor. Have someone stay with you for 24 hours after your head injury. This person should watch you for any changes in your symptoms and be ready to get help. Keep all follow-up visits as told by your doctor. This is important. How is this prevented? Work on your balance and strength. This can help you avoid falls. Wear a seat belt when you are in a moving vehicle. Wear a helmet when you: Ride a bicycle. Ski. Do any other sport or activity that has a risk of injury. If you drink alcohol: Limit how much you use to: 0-1 drink a day for nonpregnant women. 0-2 drinks a day for men. Be aware of how much alcohol is in your drink. In the [place] one drink equals one 12 oz bottle of beer (355 mL), one 5 oz glass of wine (148 mL), or one 1 oz glass of hard liquor (44 mL). Make your home safer by: Getting rid of clutter from the floors and stairs. This includes things that can make you trip. Using grab bars in bathrooms and handrails by stairs. Placing non-slip mats on floors and in bathtubs. Putting more light in dim areas. Where to find more information Centers for Disease Control and Prevention: www.cdc.gov Get help right away if: You have: A very bad headache that is not helped by medicine. Trouble walking or weakness in your arms and legs. Clear or bloody fluid coming from your nose or ears. Changes in how you see (vision). A seizure. More confusion or more grumpy moods. Your symptoms get worse. You are sleepier than normal and have trouble staying awake. You lose your balance. The black centers of your eyes (pupils) change in size. Your speech is slurred. Your dizziness gets worse. You vomit. These symptoms may be an  emergency. Do not wait to see if the symptoms will go away. Get medical help right away. Call your local emergency services (911 in the [place] Do not drive yourself to the hospital. Summary Head injuries can be as minor as a small bump. Some head injuries can be worse. Treatment for this condition depends on how severe the injury is and the type of injury you have. Have someone stay with you for 24 hours after your head injury. Ask your doctor when it is safe for you to go back to your normal activities, such as work or school. To prevent a head injury, wear a seat belt in a car, wear a helmet when you use a bicycle, limit your alcohol use, and make your home safer. This information is not intended to replace advice given to you by your health care provider. Make sure you discuss any questions you have with your health care provider. Document Revised: 07/07/2019 Document Reviewed: 07/07/2019 [employer] Patient Education  2023 [employer]  

## 2023-01-18 NOTE — Progress Notes (Signed)
Subjective:    Patient ID: [name], female    DOB: 05/07/1971, 52 y.o.   MRN: [id number]  HPI  Patient presents to clinic today with complaint of a fall.  This occurred yesterday after she stepped in hole, falling backwards and hit her head on the ground (grass). She describes the headache as pounding in the back of her head. She has associated neck pain. She denies dizziness, confusion, vision changes, sensitivity to light or sound, nausea or vomiting. She denies numbness, tingling or weakness of her upper extremities. She has tried Tylenol and used ice with some relief of symptoms.   Review of Systems     Past Medical History:  Diagnosis Date   Allergy    Anemia    present for many years, mild, stable   Anxiety    Breast cancer (HCC)    Breast cancer (HCC) 12/18/2020   Dysrhythmia    PAC's on flecanide (did not tolerate beta blockers)   Family history of breast cancer 01/20/2021   Family history of prostate cancer 01/20/2021   Frequent headaches    HSV infection    fever blisters   Hypertension    Hypothyroidism    Personal history of radiation therapy     Current Outpatient Medications  Medication Sig Dispense Refill   ALPRAZolam (XANAX) 0.25 MG tablet Take 1 tablet (0.25 mg total) by mouth 2 (two) times daily as needed. 20 tablet 0   anastrozole (ARIMIDEX) 1 MG tablet Take 1 tablet (1 mg total) by mouth daily. 30 tablet 3   atorvastatin (LIPITOR) 10 MG tablet TAKE 1 TABLET BY MOUTH EVERY DAY 90 tablet 1   butalbital-acetaminophen-caffeine (FIORICET) 50-325-40 MG tablet Take 1 tablet by mouth every 6 (six) hours as needed for headache. 20 tablet 5   cyclobenzaprine (FLEXERIL) 10 MG tablet Take 1 tablet (10 mg total) by mouth daily as needed (migraines). 15 tablet 5   flecainide (TAMBOCOR) 50 MG tablet Take 1 tablet (50 mg total) by mouth 2 (two) times daily. 180 tablet 2   fluticasone (FLONASE) 50 MCG/ACT nasal spray SPRAY 2 SPRAYS INTO EACH NOSTRIL EVERY DAY  48 mL 0   gabapentin (NEURONTIN) 100 MG capsule Take 1 capsule (100 mg total) by mouth 3 (three) times daily as needed. Use in addition to 300mg  dose 90 capsule 1   gabapentin (NEURONTIN) 300 MG capsule TAKE 1 CAPSULE BY MOUTH TWICE A DAY 60 capsule 1   KLOR-CON M10 10 MEQ tablet TAKE 1 TABLET BY MOUTH TWICE A DAY 180 tablet 1   levocetirizine (XYZAL) 5 MG tablet TAKE 1 TABLET BY MOUTH EVERY DAY IN THE EVENING 90 tablet 3   levothyroxine (SYNTHROID) 88 MCG tablet Take 1 tablet (88 mcg total) by mouth daily. 90 tablet 0   losartan (COZAAR) 50 MG tablet Take 1 tablet (50 mg total) by mouth daily. 90 tablet 3   omeprazole (PRILOSEC) 20 MG capsule TAKE 1 CAPSULE BY MOUTH EVERY DAY 90 capsule 1   rizatriptan (MAXALT-MLT) 10 MG disintegrating tablet Take 1 tablet (10 mg total) by mouth as needed for migraine. May repeat in 2 hours if needed 10 tablet 1   SUMAtriptan (IMITREX) 25 MG tablet TAKE 1 TABLET (25 MG TOTAL) BY MOUTH EVERY 2 (TWO) HOURS AS NEEDED FOR MIGRAINE. MAY REPEAT IN 2 HOURS IF HEADACHE PERSISTS OR RECURS. 10 tablet 0   traMADol (ULTRAM) 50 MG tablet Take 1 tablet (50 mg total) by mouth every 12 (twelve) hours as needed.  20 tablet 0   valACYclovir (VALTREX) 500 MG tablet TAKE 1 TABLET (500 MG TOTAL) BY MOUTH DAILY AS NEEDED (HERPES SIMPLEX). 90 tablet 0   venlafaxine XR (EFFEXOR-XR) 75 MG 24 hr capsule TAKE 1 CAPSULE BY MOUTH DAILY WITH BREAKFAST. 90 capsule 1   No current facility-administered medications for this visit.    Allergies  Allergen Reactions   Ciprofloxacin     anxiety   Keflex [Cephalexin] Hives   Trazodone And Nefazodone Other (See Comments)    nightmares    Family History  Problem Relation Age of Onset   Depression Mother    Breast cancer Mother 25   Hyperlipidemia Father    Prostate cancer Father 101    Social History   Socioeconomic History   Marital status: Single    Spouse name: Not on file   Number of children: 1   Years of education: Not on file    Highest education level: Bachelor's degree (e.g., BA, AB, BS)  Occupational History   Not on file  Tobacco Use   Smoking status: Never   Smokeless tobacco: Never  Vaping Use   Vaping Use: Never used  Substance and Sexual Activity   Alcohol use: Yes    Comment: occasional   Drug use: No   Sexual activity: Yes    Partners: Male    Birth control/protection: Pill  Other Topics Concern   Not on file  Social History Narrative   Not on file   Social Determinants of Health   Financial Resource Strain: Low Risk  (01/14/2023)   Overall Financial Resource Strain (CARDIA)    Difficulty of Paying Living Expenses: Not hard at all  Food Insecurity: No Food Insecurity (01/14/2023)   Hunger Vital Sign    Worried About Running Out of Food in the Last Year: Never true    Ran Out of Food in the Last Year: Never true  Transportation Needs: No Transportation Needs (01/14/2023)   PRAPARE - Administrator, Civil Service (Medical): No    Lack of Transportation (Non-Medical): No  Physical Activity: Insufficiently Active (01/14/2023)   Exercise Vital Sign    Days of Exercise per Week: 2 days    Minutes of Exercise per Session: 30 min  Stress: No Stress Concern Present (01/14/2023)   [employer] of Occupational Health - Occupational Stress Questionnaire    Feeling of Stress : Not at all  Social Connections: Socially Isolated (01/14/2023)   Social Connection and Isolation Panel [NHANES]    Frequency of Communication with Friends and Family: Once a week    Frequency of Social Gatherings with Friends and Family: Twice a week    Attends Religious Services: Never    Database administrator or Organizations: No    Attends Engineer, structural: Not on file    Marital Status: Never married  Intimate Partner Violence: Not At Risk (01/02/2021)   Humiliation, Afraid, Rape, and Kick questionnaire    Fear of Current or Ex-Partner: No    Emotionally Abused: No    Physically Abused: No     Sexually Abused: No     Constitutional: Patient reports headache.  Denies fever, malaise, fatigue, or abrupt weight changes.  HEENT: Denies eye pain, eye redness, ear pain, ringing in the ears, wax buildup, runny nose, nasal congestion, bloody nose, or sore throat. Respiratory: Denies difficulty breathing, shortness of breath, cough or sputum production.   Cardiovascular: Denies chest pain, chest tightness, palpitations or swelling in the hands  or feet.  Gastrointestinal: Denies abdominal pain, bloating, constipation, diarrhea or blood in the stool.  Musculoskeletal: Patient reports neck pain.  Denies decrease in range of motion, difficulty with gait, muscle pain or joint swelling.  Skin: Denies redness, rashes, lesions or ulcercations.  Neurological: Denies dizziness, difficulty with memory, difficulty with speech or problems with balance and coordination.    No other specific complaints in a complete review of systems (except as listed in HPI above).  Objective:   Physical Exam BP 122/82 (BP Location: Right Arm, Patient Position: Sitting, Cuff Size: Normal)   Pulse 92   Temp (!) 96.4 F (35.8 C) (Temporal)   Wt 221 lb (100.2 kg)   SpO2 97%   BMI 36.78 kg/m   Wt Readings from Last 3 Encounters:  01/14/23 227 lb (103 kg)  10/05/22 231 lb 4.8 oz (104.9 kg)  08/19/22 222 lb (100.7 kg)    General: Appears her stated age, obese, in NAD. Skin: Warm, dry and intact. No bruising or laceration of the scalp noted. HEENT: Head: normal shape and size; Eyes: sclera white, no icterus, conjunctiva pink, PERRLA and EOMs intact;  Cardiovascular: Normal rate and rhythm.  Pulmonary/Chest: Normal effort and positive vesicular breath sounds. No respiratory distress. No wheezes, rales or ronchi noted.  Musculoskeletal: Normal flexion, extension, rotation of the cervical spine.  No bony tenderness noted over the cervical spine.  No palpable deformity noted of the posterior scalp no difficulty with  gait.  Neurological: Alert and oriented. Coordination normal.  Pain   BMET    Component Value Date/Time   NA 140 01/14/2023 0816   NA 143 01/26/2020 1600   K 4.0 01/14/2023 0816   CL 103 01/14/2023 0816   CO2 28 01/14/2023 0816   GLUCOSE 97 01/14/2023 0816   BUN 11 01/14/2023 0816   BUN 11 01/26/2020 1600   CREATININE 0.94 01/14/2023 0816   CALCIUM 9.4 01/14/2023 0816   GFRNONAA >60 10/05/2022 1339   GFRAA >60 02/25/2020 2231    Lipid Panel     Component Value Date/Time   CHOL 220 (H) 01/14/2023 0816   TRIG 142 01/14/2023 0816   HDL 51 01/14/2023 0816   CHOLHDL 4.3 01/14/2023 0816   VLDL 18.8 12/21/2019 1204   LDLCALC 142 (H) 01/14/2023 0816    CBC    Component Value Date/Time   WBC 4.3 01/14/2023 0816   RBC 4.21 01/14/2023 0816   HGB 12.3 01/14/2023 0816   HGB 12.6 10/05/2022 1339   HGB 10.1 (L) 04/25/2020 1154   HCT 37.6 01/14/2023 0816   HCT 31.6 (L) 04/25/2020 1154   PLT 275 01/14/2023 0816   PLT 223 10/05/2022 1339   PLT 261 04/25/2020 1154   MCV 89.3 01/14/2023 0816   MCV 80 04/25/2020 1154   MCH 29.2 01/14/2023 0816   MCHC 32.7 01/14/2023 0816   RDW 14.4 01/14/2023 0816   RDW 16.8 (H) 04/25/2020 1154   LYMPHSABS 1.3 10/05/2022 1339   LYMPHSABS 1.4 04/25/2020 1154   MONOABS 0.3 10/05/2022 1339   EOSABS 0.1 10/05/2022 1339   EOSABS 0.0 04/25/2020 1154   BASOSABS 0.1 10/05/2022 1339   BASOSABS 0.1 04/25/2020 1154    Hgb A1C Lab Results  Component Value Date   HGBA1C 5.7 (H) 01/14/2023            Assessment & Plan:   Headache s/p Fall:  No indication that she has a scalp fracture, internal head bleed or concussion at this time Toradol 30 mg IM  x 1 given for neck pain and headache Advised her to monitor for increased pain, dizziness, vision changes, mental status changes, vomiting-if the symptoms occur, would recommend CT head for further evaluation Recommend rest, ice and ibuprofen OTC  RTC in 6 months for annual exam Nicki Reaper,  NP

## 2023-01-18 NOTE — Telephone Encounter (Signed)
Requested Prescriptions  Pending Prescriptions Disp Refills   valACYclovir (VALTREX) 500 MG tablet [Pharmacy Med Name: VALACYCLOVIR HCL 500 MG TABLET] 90 tablet 0    Sig: TAKE 1 TABLET (500 MG TOTAL) BY MOUTH DAILY AS NEEDED (HERPES SIMPLEX).     Antimicrobials:  Antiviral Agents - Anti-Herpetic Passed - 01/17/2023 12:10 AM      Passed - Valid encounter within last 12 months    Recent Outpatient Visits           Today Acute neck pain   [place] [facility] [place], Salvadore Oxford, NP   4 days ago Acquired hypothyroidism   [place] [facility] [place], Salvadore Oxford, NP   5 months ago Flu-like symptoms   [place] [facility] [place], [place] W, NP   6 months ago Chronic migraine without aura with status migrainosus, not intractable   [place] [facility] [place], Salvadore Oxford, NP   6 months ago Encounter for general adult medical examination with abnormal findings   [place] [facility] [place], Salvadore Oxford, NP       Future Appointments             In 1 month Sherie Don, NP [facility] [place]   In 5 months [place], Salvadore Oxford, NP [facility], [facility]

## 2023-01-19 ENCOUNTER — Other Ambulatory Visit: Payer: Self-pay | Admitting: Internal Medicine

## 2023-01-19 NOTE — Telephone Encounter (Signed)
Requested medication (s) are due for refill today - yes  Requested medication (s) are on the active medication list -yes  Future visit scheduled -yes  Last refill: 06/25/22 #15 5RF  Notes to clinic: non delegated Rx  Requested Prescriptions  Pending Prescriptions Disp Refills   cyclobenzaprine (FLEXERIL) 10 MG tablet [Pharmacy Med Name: CYCLOBENZAPRINE 10 MG TABLET] 15 tablet 5    Sig: Take 1 tablet (10 mg total) by mouth daily as needed (migraines).     Not Delegated - Analgesics:  Muscle Relaxants Failed - 01/19/2023  3:44 PM      Failed - This refill cannot be delegated      Passed - Valid encounter within last 6 months    Recent Outpatient Visits           Yesterday Acute neck pain   [place] [facility] [place], Salvadore Oxford, NP   5 days ago Acquired hypothyroidism   [place] [facility] [place], Salvadore Oxford, NP   5 months ago Flu-like symptoms   [place] [facility] [place], [place], NP   6 months ago Chronic migraine without aura with status migrainosus, not intractable   [place] [facility] [facility] [place], Salvadore Oxford, NP   6 months ago Encounter for general adult medical examination with abnormal findings   [place] [facility] [place], Salvadore Oxford, NP       Future Appointments             In 1 month Sherie Don, NP [facility] [facility] [place]   In 5 months [place], Salvadore Oxford, NP [place] [facility], [facility]               Requested Prescriptions  Pending Prescriptions Disp Refills   cyclobenzaprine (FLEXERIL) 10 MG tablet [Pharmacy Med Name: CYCLOBENZAPRINE 10 MG TABLET] 15 tablet 5    Sig: Take 1 tablet (10 mg total) by mouth daily as needed (migraines).     Not Delegated - Analgesics:  Muscle Relaxants Failed - 01/19/2023  3:44 PM      Failed - This refill cannot be delegated      Passed - Valid encounter within last 6 months    Recent Outpatient Visits            Yesterday Acute neck pain   [place] [facility] [place], Salvadore Oxford, NP   5 days ago Acquired hypothyroidism   [place] [facility] [place], Salvadore Oxford, NP   5 months ago Flu-like symptoms   [place] [facility] [place], [place], NP   6 months ago Chronic migraine without aura with status migrainosus, not intractable   [place] [facility] [place], Salvadore Oxford, NP   6 months ago Encounter for general adult medical examination with abnormal findings   [place] [facility] [place], Salvadore Oxford, NP       Future Appointments             In 1 month Sherie Don, NP [place] [facility] [place]   In 5 months [place], Salvadore Oxford, NP  [facility], [facility]

## 2023-02-02 ENCOUNTER — Encounter: Payer: Self-pay | Admitting: Internal Medicine

## 2023-02-15 ENCOUNTER — Ambulatory Visit: Payer: BC Managed Care – PPO

## 2023-02-18 ENCOUNTER — Telehealth: Payer: Self-pay | Admitting: Internal Medicine

## 2023-02-18 ENCOUNTER — Encounter: Payer: Self-pay | Admitting: Hematology

## 2023-02-18 ENCOUNTER — Inpatient Hospital Stay: Payer: BC Managed Care – PPO

## 2023-02-18 NOTE — Telephone Encounter (Signed)
Called patient twice, left a message regarding next following appointment with time/dates.

## 2023-02-20 NOTE — Progress Notes (Unsigned)
Cardiology Office Note Date:  02/22/2023  Patient ID:  [name], [name] 20-Sep-1970, MRN [id number] PCP:  Lorre Munroe, NP  Cardiologist:  Julien Nordmann, MD Electrophysiologist: Sherryl Manges, MD     Chief Complaint: [facility], NSVT follow-up  History of Present Illness: [name] is a 52 y.o. female with PMH notable for [facility], NSVT, HTN; seen today for Sherryl Manges, MD for routine electrophysiology followup.  She last saw Dr. Graciela Husbands 10/2021, was doing well with resolution of palpitations on flecainide.   Today, she states that her palpitations seem to have returned. They are very brief, but very often, sometimes happening multiple times per day. Most of the time, they are too brief for her to be able to record on her apple watch. No Chest pain, pressure, SOB, dizziness with palpitation episodes.  She continues to take flecainide BID.  BP readings at home are 120-130/70-80. She is a Runner, broadcasting/film/video, and off for the summer.   AAD History: flecainide  Past Medical History:  Diagnosis Date   Allergy    Anemia    present for many years, mild, stable   Anxiety    Breast cancer (HCC)    Breast cancer (HCC) 12/18/2020   Dysrhythmia    PAC's on flecanide (did not tolerate beta blockers)   Family history of breast cancer 01/20/2021   Family history of prostate cancer 01/20/2021   Frequent headaches    HSV infection    fever blisters   Hypertension    Hypothyroidism    Personal history of radiation therapy     Past Surgical History:  Procedure Laterality Date   APPENDECTOMY  2013   BREAST BIOPSY Left 10/2020   BREAST LUMPECTOMY Left 12/18/2020   BREAST LUMPECTOMY WITH RADIOACTIVE SEED LOCALIZATION Left 12/18/2020   Procedure: LEFT BREAST LUMPECTOMY WITH RADIOACTIVE SEED LOCALIZATION;  Surgeon: Harriette Bouillon, MD;  Location: [facility];  Service: General;  Laterality: Left;   KNEE ARTHROSCOPY Right    lapband  2013   SENTINEL NODE BIOPSY Left 01/07/2021    Procedure: LEFT SENTINEL NODE BIOPSY;  Surgeon: Harriette Bouillon, MD;  Location: [facility];  Service: General;  Laterality: Left;   TONSILLECTOMY  2012    Current Outpatient Medications  Medication Instructions   ALPRAZolam (XANAX) 0.25 mg, Oral, 2 times daily PRN   anastrozole (ARIMIDEX) 1 mg, Oral, Daily   atorvastatin (LIPITOR) 10 MG tablet TAKE 1 TABLET BY MOUTH EVERY DAY   butalbital-acetaminophen-caffeine (FIORICET) 50-325-40 MG tablet 1 tablet, Oral, Every 6 hours PRN   cyclobenzaprine (FLEXERIL) 10 mg, Oral, Daily PRN   flecainide (TAMBOCOR) 50 mg, Oral, 2 times daily   fluticasone (FLONASE) 50 MCG/ACT nasal spray SPRAY 2 SPRAYS INTO EACH NOSTRIL EVERY DAY   gabapentin (NEURONTIN) 300 MG capsule TAKE 1 CAPSULE BY MOUTH TWICE A DAY   gabapentin (NEURONTIN) 100 mg, Oral, 3 times daily PRN, Use in addition to 300mg  dose   KLOR-CON M10 10 MEQ tablet TAKE 1 TABLET BY MOUTH TWICE A DAY   levocetirizine (XYZAL) 5 MG tablet TAKE 1 TABLET BY MOUTH EVERY DAY IN THE EVENING   levothyroxine (SYNTHROID) 88 mcg, Oral, Daily   losartan (COZAAR) 50 mg, Oral, Daily   omeprazole (PRILOSEC) 20 MG capsule Oral, Daily   rizatriptan (MAXALT-MLT) 10 mg, Oral, As needed, May repeat in 2 hours if needed   SUMAtriptan (IMITREX) 25 mg, Oral, Every 2 hours PRN, May repeat in 2 hours if headache persists or recurs.   valACYclovir (VALTREX)  500 mg, Oral, Daily PRN   venlafaxine XR (EFFEXOR-XR) 75 MG 24 hr capsule TAKE 1 CAPSULE BY MOUTH DAILY WITH BREAKFAST.    Social History:  The patient  reports that she has never smoked. She has never used smokeless tobacco. She reports current alcohol use. She reports that she does not use drugs.   Family History:  The patient's family history includes Breast cancer (age of onset: 49) in her mother; Depression in her mother; Hyperlipidemia in her father; Prostate cancer (age of onset: 55) in her father.  ROS:  Please see the history of present illness. All other systems  are reviewed and otherwise negative.   PHYSICAL EXAM:  VS:  BP 134/88 (BP Location: Left Arm, Patient Position: Sitting, Cuff Size: Large)   Pulse 78   Ht 5' 4.75" (1.645 m)   Wt 218 lb (98.9 kg)   SpO2 98%   BMI 36.56 kg/m  BMI: Body mass index is 36.56 kg/m.  GEN- The patient is well appearing, alert and oriented x 3 today.   Lungs- Clear to ausculation bilaterally, normal work of breathing.  Heart- Regular rate and rhythm, no murmurs, rubs or gallops Extremities- No peripheral edema, warm, dry   EKG is ordered. Personal review of EKG from today shows:  NSR, rate 78bpm PR - QRS 84   10/30/2021 EKG NSR, rate 77 PR - 140 QRS 86   @HCMUSEEKG @   Recent Labs: 01/14/2023: ALT 8; BUN 11; Creat 0.94; Hemoglobin 12.3; Platelets 275; Potassium 4.0; Sodium 140; TSH 0.04  01/14/2023: Cholesterol 220; HDL 51; LDL Cholesterol (Calc) 142; Total CHOL/HDL Ratio 4.3; Triglycerides 142   CrCl cannot be calculated (Patient's most recent lab result is older than the maximum 21 days allowed.).   Wt Readings from Last 3 Encounters:  02/22/23 218 lb (98.9 kg)  01/18/23 221 lb (100.2 kg)  01/14/23 227 lb (103 kg)     Additional studies reviewed include: Previous EP, cardiology notes.   TTE, 01/21/2021  1. Left ventricular ejection fraction, by estimation, is 55 to 60%. The left ventricle has normal function. The left ventricle has no regional wall motion abnormalities. There is mild left ventricular hypertrophy. Left ventricular diastolic parameters are indeterminate.   2. Right ventricular systolic function is normal. The right ventricular size is normal. There is normal pulmonary artery systolic pressure.   3. Left atrial size was mildly dilated.   4. The mitral valve is normal in structure. Trivial mitral valve regurgitation. No evidence of mitral stenosis.   5. The aortic valve has an indeterminant number of cusps. Aortic valve regurgitation is not visualized. No aortic stenosis is  present.   6. The inferior vena cava is normal in size with greater than 50% respiratory variability, suggesting right atrial pressure of 3 mmHg.   ETT, 01/09/2020 Blood pressure demonstrated a hypertensive response to exercise. There was no ST segment deviation noted during stress.  Cardiac MRI, 01/03/2020 1. Normal LVEF with no RWMA's or LVH EF 53%  2.  No delayed gadolinium uptake in the LV myocardium  3. Mild RVE but normal RVEF 61% with no focal areas of hypokinesis or evidence of RV dysplasia  4.  Normal cardiac valves  5.  Normal aortic root 3.2 cm  6.  Normal cardiac valves  Long Term Monitor, 01/10/2019 1 run of Ventricular Tachycardia occurred lasting 7 beats with a max rate of 164 bpm (avg 148 bpm).   9 Supraventricular Tachycardia runs occurred, the run with the fastest interval  lasting 7 beats with a max rate of 200 bpm, the longest lasting 10 beats with an avg rate of 131 bpm.  Isolated SVEs were occasional (2.1%, 09811), SVE Couplets were rare (<1.0%, 35), and SVE Triplets were rare (<1.0%, 8).  Isolated VEs were rare (<1.0%), and no VE Couplets or VE Triplets were present.  Patient triggered events associated with normal sinus rhythm, sometimes with PACs  ASSESSMENT AND PLAN:  #) PAC #) NSVT #) palpitations Palpitation episodes previously well controlled on flecainide 50mg  BID, not as much recently Given extremely short duration (pt unable to record on apple watch), favor repeat monitoring to further eval  - 2 week zio Continue 50mg  flecainide BID - pt previously intolerant of BB and CCB   #) HTN Well-controlled, continue losartan   Current medicines are reviewed at length with the patient today.   The patient does not have concerns regarding her medicines.  The following changes were made today:  none  Labs/ tests ordered today include:  No orders of the defined types were placed in this encounter.    Disposition: Follow up with Dr. Graciela Husbands or EP APP in 6  months   Signed, Sherie Don, NP  02/22/23  11:08 AM  Electrophysiology [facility]

## 2023-02-22 ENCOUNTER — Ambulatory Visit (INDEPENDENT_AMBULATORY_CARE_PROVIDER_SITE_OTHER): Payer: BC Managed Care – PPO

## 2023-02-22 ENCOUNTER — Encounter: Payer: Self-pay | Admitting: Cardiology

## 2023-02-22 ENCOUNTER — Ambulatory Visit: Payer: BC Managed Care – PPO | Attending: Cardiology | Admitting: Cardiology

## 2023-02-22 VITALS — BP 134/88 | HR 78 | Ht 64.75 in | Wt 218.0 lb

## 2023-02-22 DIAGNOSIS — I4729 Other ventricular tachycardia: Secondary | ICD-10-CM

## 2023-02-22 DIAGNOSIS — I491 Atrial premature depolarization: Secondary | ICD-10-CM

## 2023-02-22 DIAGNOSIS — I10 Essential (primary) hypertension: Secondary | ICD-10-CM | POA: Diagnosis not present

## 2023-02-22 NOTE — Patient Instructions (Signed)
Medication Instructions:  Your physician recommends that you continue on your current medications as directed. Please refer to the Current Medication list given to you today.  *If you need a refill on your cardiac medications before your next appointment, please call your pharmacy*   Lab Work: No labs ordered  If you have labs (blood work) drawn today and your tests are completely normal, you will receive your results only by: MyChart Message (if you have MyChart) OR A paper copy in the mail If you have any lab test that is abnormal or we need to change your treatment, we will call you to review the results.   Testing/Procedures: Your physician has recommended that you wear a Zio monitor.   This monitor is a medical device that records the heart's electrical activity. Doctors most often use these monitors to diagnose arrhythmias. Arrhythmias are problems with the speed or rhythm of the heartbeat. The monitor is a small device applied to your chest. You can wear one while you do your normal daily activities. While wearing this monitor if you have any symptoms to push the button and record what you felt. Once you have worn this monitor for the period of time provider prescribed (Usually 14 days), you will return the monitor device in the postage paid box. Once it is returned they will download the data collected and provide [place] with a report which the provider will then review and we will call you with those results. Important tips:  Avoid showering during the first 24 hours of wearing the monitor. Avoid excessive sweating to help maximize wear time. Do not submerge the device, no hot tubs, and no swimming pools. Keep any lotions or oils away from the patch. After 24 hours you may shower with the patch on. Take brief showers with your back facing the shower head.  Do not remove patch once it has been placed because that will interrupt data and decrease adhesive wear time. Push the button when  you have any symptoms and write down what you were feeling. Once you have completed wearing your monitor, remove and place into box which has postage paid and place in your outgoing mailbox.  If for some reason you have misplaced your box then call our office and we can provide another box and/or mail it off for you.  Follow-Up: At [facility], you and your health needs are our priority.  As part of our continuing mission to provide you with exceptional heart care, we have created designated Provider Care Teams.  These Care Teams include your primary Cardiologist (physician) and Advanced Practice Providers (APPs -  Physician Assistants and Nurse Practitioners) who all work together to provide you with the care you need, when you need it.  We recommend signing up for the patient portal called "MyChart".  Sign up information is provided on this After Visit Summary.  MyChart is used to connect with patients for Virtual Visits (Telemedicine).  Patients are able to view lab/test results, encounter notes, upcoming appointments, etc.  Non-urgent messages can be sent to your provider as well.   To learn more about what you can do with MyChart, go to [email or web address].    Your next appointment:   6 month(s)  Provider:   Sherryl Manges, MD

## 2023-02-25 ENCOUNTER — Other Ambulatory Visit: Payer: Self-pay | Admitting: Internal Medicine

## 2023-02-26 ENCOUNTER — Encounter: Payer: Self-pay | Admitting: Hematology

## 2023-02-26 ENCOUNTER — Other Ambulatory Visit: Payer: Self-pay | Admitting: Internal Medicine

## 2023-02-26 ENCOUNTER — Inpatient Hospital Stay: Payer: BC Managed Care – PPO | Attending: Hematology

## 2023-02-26 ENCOUNTER — Telehealth: Payer: Self-pay | Admitting: Hematology

## 2023-02-26 VITALS — BP 128/84 | HR 85 | Temp 98.6°F | Resp 17

## 2023-02-26 DIAGNOSIS — E538 Deficiency of other specified B group vitamins: Secondary | ICD-10-CM | POA: Insufficient documentation

## 2023-02-26 DIAGNOSIS — I491 Atrial premature depolarization: Secondary | ICD-10-CM | POA: Diagnosis not present

## 2023-02-26 DIAGNOSIS — D519 Vitamin B12 deficiency anemia, unspecified: Secondary | ICD-10-CM

## 2023-02-26 DIAGNOSIS — I4729 Other ventricular tachycardia: Secondary | ICD-10-CM | POA: Diagnosis not present

## 2023-02-26 MED ORDER — CYANOCOBALAMIN 1000 MCG/ML IJ SOLN
1000.0000 ug | Freq: Once | INTRAMUSCULAR | Status: AC
  Administered 2023-02-26: 1000 ug via INTRAMUSCULAR
  Filled 2023-02-26: qty 1

## 2023-02-26 NOTE — Telephone Encounter (Signed)
This is a [place] pt 

## 2023-02-26 NOTE — Telephone Encounter (Signed)
Synthroid 100 mcg discontinued 01/15/2023 so this refill was refused.   Dose changed to 88 mcg.

## 2023-03-09 ENCOUNTER — Telehealth: Payer: Self-pay

## 2023-03-09 ENCOUNTER — Encounter: Payer: Self-pay | Admitting: Hematology

## 2023-03-09 NOTE — Telephone Encounter (Signed)
Opened in error

## 2023-03-10 ENCOUNTER — Other Ambulatory Visit: Payer: Self-pay | Admitting: Hematology

## 2023-03-10 MED ORDER — VEOZAH 45 MG PO TABS: 45.0000 mg | ORAL_TABLET | Freq: Every day | ORAL | 3 refills | Status: DC

## 2023-03-18 ENCOUNTER — Ambulatory Visit
Admission: RE | Admit: 2023-03-18 | Discharge: 2023-03-18 | Disposition: A | Discharge status: Home / Self Care | Payer: BC Managed Care – PPO | Source: Ambulatory Visit | Attending: Internal Medicine | Admitting: Internal Medicine

## 2023-03-18 ENCOUNTER — Ambulatory Visit: Payer: BC Managed Care – PPO | Admitting: Internal Medicine

## 2023-03-18 ENCOUNTER — Other Ambulatory Visit: Payer: Self-pay | Admitting: Nurse Practitioner

## 2023-03-18 ENCOUNTER — Encounter: Payer: Self-pay | Admitting: Internal Medicine

## 2023-03-18 ENCOUNTER — Other Ambulatory Visit: Payer: Self-pay | Admitting: Hematology

## 2023-03-18 ENCOUNTER — Other Ambulatory Visit: Payer: Self-pay

## 2023-03-18 ENCOUNTER — Ambulatory Visit
Admission: RE | Admit: 2023-03-18 | Discharge: 2023-03-18 | Disposition: A | Discharge status: Home / Self Care | Payer: BC Managed Care – PPO | Attending: Internal Medicine | Admitting: Internal Medicine

## 2023-03-18 ENCOUNTER — Telehealth: Payer: Self-pay

## 2023-03-18 VITALS — BP 124/72 | HR 96 | Temp 97.1°F | Wt 214.0 lb

## 2023-03-18 DIAGNOSIS — M79641 Pain in right hand: Secondary | ICD-10-CM | POA: Diagnosis not present

## 2023-03-18 DIAGNOSIS — M79642 Pain in left hand: Secondary | ICD-10-CM | POA: Insufficient documentation

## 2023-03-18 DIAGNOSIS — M79601 Pain in right arm: Secondary | ICD-10-CM | POA: Insufficient documentation

## 2023-03-18 DIAGNOSIS — R202 Paresthesia of skin: Secondary | ICD-10-CM | POA: Diagnosis not present

## 2023-03-18 DIAGNOSIS — M79602 Pain in left arm: Secondary | ICD-10-CM | POA: Insufficient documentation

## 2023-03-18 DIAGNOSIS — E039 Hypothyroidism, unspecified: Secondary | ICD-10-CM | POA: Diagnosis not present

## 2023-03-18 MED ORDER — VENLAFAXINE HCL ER 75 MG PO CP24: 75.0000 mg | ORAL_CAPSULE | Freq: Every day | ORAL | 1 refills | Status: DC

## 2023-03-18 NOTE — Patient Instructions (Signed)
Joint Pain  Joint pain can be caused by many things. It is likely to go away if you follow instructions from your doctor for taking care of yourself at home. Sometimes, you may need more treatment. Follow these instructions at home: Managing pain, stiffness, and swelling     If told, put ice on the painful area. To do this: If you have a removable elastic bandage, sling, or splint, take it off as told by your doctor. Put ice in a plastic bag. Place a towel between your skin and the bag. Leave the ice on for 20 minutes, 2-3 times a day. Take off the ice if your skin turns bright red. This is very important. If you cannot feel pain, heat, or cold, you have a greater risk of damage to the area. Move your fingers or toes below the painful joint often. Raise the painful joint above the level of your heart while you are sitting or lying down. If told, put heat on the painful area. Do this as often as told by your doctor. Use the heat source that your doctor recommends, such as a moist heat pack or a heating pad. Place a towel between your skin and the heat source. Leave the heat on for 20-30 minutes. Take off the heat if your skin gets bright red. This is especially important if you are unable to feel pain, heat, or cold. You may have a greater risk of getting burned. Activity Rest the painful joint for as long as told by your doctor. Do not do things that cause pain or make your pain worse. Begin exercising or stretching the affected area, as told by your doctor. Ask your doctor what types of exercise are safe for you. Return to your normal activities when your doctor says that it is safe. If you have an elastic bandage, sling, or splint: Wear it as told by your doctor. Take it only as told by your doctor. Loosen it your fingers or toes below the joint: Tingle. Become numb. Get cold and blue. Keep it clean. Ask your doctor if you should take it off before bathing. If it is not  waterproof: Do not let it get wet. Cover it with a watertight covering when you take a bath or shower. General instructions Take over-the-counter and prescription medicines only as told by your doctor. This may include medicines taken by mouth or applied to the skin. Do not smoke or use any products that contain nicotine or tobacco. If you need help quitting, ask your doctor. Keep all follow-up visits as told by your doctor. This is important. Contact a doctor if: You have pain that gets worse and does not get better with medicine. Your joint pain does not get better in 3 days. You have more bruising or swelling. You have a fever. You lose 10 lb (4.5 kg) or more without trying. Get help right away if: You cannot move the joint. Your fingers or toes tingle, become numb. or get cold and blue. You have a fever along with a joint that is red, warm, and swollen. Summary Joint pain can be caused by many things. It often goes away if you follow instructions from your doctor for taking care of yourself at home. Rest the painful joint for as long as told. Do not do things that cause pain or make your pain worse. Take over-the-counter and prescription medicines only as told by your doctor. This information is not intended to replace advice given to you  by your health care provider. Make sure you discuss any questions you have with your health care provider. Document Revised: 12/05/2019 Document Reviewed: 12/06/2019 Elsevier Patient Education  2024 [employer].

## 2023-03-18 NOTE — Progress Notes (Signed)
Subjective:    Patient ID: [name], female    DOB: October 28, 1970, 52 y.o.   MRN: [id number]  HPI  Patient presents to the clinic today with complaint of joint pain in hands.  She noticed this 4 days ago.  She describes the pain as sore and achy. She feels like her joints are swollen. She has tingling in her fingers but she denies numbness or weakness. She has tried gabapentin, ibuprofen and tylenol with some relief of symptoms. She denies recent tick bite. She has no family history of autoimmune disease. She had a negative autoimmune workup in 2022.  Review of Systems  Past Medical History:  Diagnosis Date   Allergy    Anemia    present for many years, mild, stable   Anxiety    Breast cancer (HCC)    Breast cancer (HCC) 12/18/2020   Dysrhythmia    PAC's on flecanide (did not tolerate beta blockers)   Family history of breast cancer 01/20/2021   Family history of prostate cancer 01/20/2021   Frequent headaches    HSV infection    fever blisters   Hypertension    Hypothyroidism    Personal history of radiation therapy     Current Outpatient Medications  Medication Sig Dispense Refill   ALPRAZolam (XANAX) 0.25 MG tablet Take 1 tablet (0.25 mg total) by mouth 2 (two) times daily as needed. 20 tablet 0   anastrozole (ARIMIDEX) 1 MG tablet Take 1 tablet (1 mg total) by mouth daily. (Patient not taking: Reported on 02/22/2023) 30 tablet 3   atorvastatin (LIPITOR) 10 MG tablet TAKE 1 TABLET BY MOUTH EVERY DAY 90 tablet 1   butalbital-acetaminophen-caffeine (FIORICET) 50-325-40 MG tablet Take 1 tablet by mouth every 6 (six) hours as needed for headache. 20 tablet 5   cyclobenzaprine (FLEXERIL) 10 MG tablet TAKE 1 TABLET (10 MG TOTAL) BY MOUTH DAILY AS NEEDED (MIGRAINES). 15 tablet 5   Fezolinetant (VEOZAH) 45 MG TABS Take 1 tablet (45 mg total) by mouth daily. 30 tablet 3   flecainide (TAMBOCOR) 50 MG tablet TAKE 1 TABLET BY MOUTH TWICE A DAY 180 tablet 3   fluticasone  (FLONASE) 50 MCG/ACT nasal spray SPRAY 2 SPRAYS INTO EACH NOSTRIL EVERY DAY (Patient taking differently: Place 2 sprays into both nostrils daily as needed.) 48 mL 0   gabapentin (NEURONTIN) 100 MG capsule Take 1 capsule (100 mg total) by mouth 3 (three) times daily as needed. Use in addition to 300mg  dose 90 capsule 1   gabapentin (NEURONTIN) 300 MG capsule TAKE 1 CAPSULE BY MOUTH TWICE A DAY (Patient taking differently: Take 300 mg by mouth 2 (two) times daily as needed.) 60 capsule 1   KLOR-CON M10 10 MEQ tablet TAKE 1 TABLET BY MOUTH TWICE A DAY 180 tablet 1   levocetirizine (XYZAL) 5 MG tablet TAKE 1 TABLET BY MOUTH EVERY DAY IN THE EVENING 90 tablet 3   levothyroxine (SYNTHROID) 88 MCG tablet Take 1 tablet (88 mcg total) by mouth daily. 90 tablet 0   losartan (COZAAR) 50 MG tablet TAKE 1 TABLET BY MOUTH EVERY DAY 90 tablet 1   omeprazole (PRILOSEC) 20 MG capsule TAKE 1 CAPSULE BY MOUTH EVERY DAY 90 capsule 1   rizatriptan (MAXALT-MLT) 10 MG disintegrating tablet Take 1 tablet (10 mg total) by mouth as needed for migraine. May repeat in 2 hours if needed 10 tablet 1   SUMAtriptan (IMITREX) 25 MG tablet TAKE 1 TABLET (25 MG TOTAL) BY MOUTH EVERY 2 (  TWO) HOURS AS NEEDED FOR MIGRAINE. MAY REPEAT IN 2 HOURS IF HEADACHE PERSISTS OR RECURS. 10 tablet 0   valACYclovir (VALTREX) 500 MG tablet TAKE 1 TABLET (500 MG TOTAL) BY MOUTH DAILY AS NEEDED (HERPES SIMPLEX). 90 tablet 0   venlafaxine XR (EFFEXOR-XR) 75 MG 24 hr capsule TAKE 1 CAPSULE BY MOUTH DAILY WITH BREAKFAST. 90 capsule 1   No current facility-administered medications for this visit.    Allergies  Allergen Reactions   Ciprofloxacin     anxiety   Keflex [Cephalexin] Hives   Trazodone And Nefazodone Other (See Comments)    nightmares    Family History  Problem Relation Age of Onset   Depression Mother    Breast cancer Mother 62   Hyperlipidemia Father    Prostate cancer Father 66    Social History   Socioeconomic History    Marital status: Single    Spouse name: Not on file   Number of children: 1   Years of education: Not on file   Highest education level: Bachelor's degree (e.g., BA, AB, BS)  Occupational History   Not on file  Tobacco Use   Smoking status: Never   Smokeless tobacco: Never  Vaping Use   Vaping status: Never Used  Substance and Sexual Activity   Alcohol use: Yes    Comment: occasional   Drug use: No   Sexual activity: Yes    Partners: Male    Birth control/protection: Pill  Other Topics Concern   Not on file  Social History Narrative   Not on file   Social Determinants of Health   Financial Resource Strain: Low Risk  (01/14/2023)   Overall Financial Resource Strain (CARDIA)    Difficulty of Paying Living Expenses: Not hard at all  Food Insecurity: No Food Insecurity (01/14/2023)   Hunger Vital Sign    Worried About Running Out of Food in the Last Year: Never true    Ran Out of Food in the Last Year: Never true  Transportation Needs: No Transportation Needs (01/14/2023)   PRAPARE - Administrator, Civil Service (Medical): No    Lack of Transportation (Non-Medical): No  Physical Activity: Insufficiently Active (01/14/2023)   Exercise Vital Sign    Days of Exercise per Week: 2 days    Minutes of Exercise per Session: 30 min  Stress: No Stress Concern Present (01/14/2023)   [employer] of Occupational Health - Occupational Stress Questionnaire    Feeling of Stress : Not at all  Social Connections: Socially Isolated (01/14/2023)   Social Connection and Isolation Panel [NHANES]    Frequency of Communication with Friends and Family: Once a week    Frequency of Social Gatherings with Friends and Family: Twice a week    Attends Religious Services: Never    Database administrator or Organizations: No    Attends Engineer, structural: Not on file    Marital Status: Never married  Intimate Partner Violence: Not At Risk (01/02/2021)   Humiliation, Afraid, Rape, and  Kick questionnaire    Fear of Current or Ex-Partner: No    Emotionally Abused: No    Physically Abused: No    Sexually Abused: No     Constitutional: Patient reports intermittent headaches.  Denies fever, malaise, fatigue, or abrupt weight changes.  Respiratory: Denies difficulty breathing, shortness of breath, cough or sputum production.   Cardiovascular: Denies chest pain, chest tightness, palpitations or swelling in the hands or feet.  Musculoskeletal: Patient reports  joint pain and swelling in hands.  Denies decrease in range of motion, difficulty with gait, muscle pain.  Skin: Denies redness, rashes, lesions or ulcercations.  Neurological: Patient reports paresthesia of hands.  Denies dizziness, difficulty with memory, difficulty with speech or problems with balance and coordination.    No other specific complaints in a complete review of systems (except as listed in HPI above).     Objective:   Physical Exam  BP 124/72 (BP Location: Right Arm, Patient Position: Sitting, Cuff Size: Large)   Pulse 96   Temp (!) 97.1 F (36.2 C) (Temporal)   Wt 214 lb (97.1 kg)   SpO2 98%   BMI 35.89 kg/m   Wt Readings from Last 3 Encounters:  02/22/23 218 lb (98.9 kg)  01/18/23 221 lb (100.2 kg)  01/14/23 227 lb (103 kg)    General: Appears her stated age, obese, in NAD. Cardiovascular: Normal rate and rhythm. S1,S2 noted.  No murmur, rubs or gallops noted.  Radial pulses 2+ bilaterally. Pulmonary/Chest: Normal effort and positive vesicular breath sounds. No respiratory distress. No wheezes, rales or ronchi noted.  Musculoskeletal: Normal flexion, extension and rotation of the wrist.  Normal flexion and extension of the fingers but pain with extension of the fingers.  No signs of joint swelling.  Handgrips equal.  No difficulty with gait.  Neurological: Alert and oriented.  Positive Tinel's on the left.  Positive Phalen's bilaterally.  Coordination normal.     BMET    Component  Value Date/Time   NA 140 01/14/2023 0816   NA 143 01/26/2020 1600   K 4.0 01/14/2023 0816   CL 103 01/14/2023 0816   CO2 28 01/14/2023 0816   GLUCOSE 97 01/14/2023 0816   BUN 11 01/14/2023 0816   BUN 11 01/26/2020 1600   CREATININE 0.94 01/14/2023 0816   CALCIUM 9.4 01/14/2023 0816   GFRNONAA >60 10/05/2022 1339   GFRAA >60 02/25/2020 2231    Lipid Panel     Component Value Date/Time   CHOL 220 (H) 01/14/2023 0816   TRIG 142 01/14/2023 0816   HDL 51 01/14/2023 0816   CHOLHDL 4.3 01/14/2023 0816   VLDL 18.8 12/21/2019 1204   LDLCALC 142 (H) 01/14/2023 0816    CBC    Component Value Date/Time   WBC 4.3 01/14/2023 0816   RBC 4.21 01/14/2023 0816   HGB 12.3 01/14/2023 0816   HGB 12.6 10/05/2022 1339   HGB 10.1 (L) 04/25/2020 1154   HCT 37.6 01/14/2023 0816   HCT 31.6 (L) 04/25/2020 1154   PLT 275 01/14/2023 0816   PLT 223 10/05/2022 1339   PLT 261 04/25/2020 1154   MCV 89.3 01/14/2023 0816   MCV 80 04/25/2020 1154   MCH 29.2 01/14/2023 0816   MCHC 32.7 01/14/2023 0816   RDW 14.4 01/14/2023 0816   RDW 16.8 (H) 04/25/2020 1154   LYMPHSABS 1.3 10/05/2022 1339   LYMPHSABS 1.4 04/25/2020 1154   MONOABS 0.3 10/05/2022 1339   EOSABS 0.1 10/05/2022 1339   EOSABS 0.0 04/25/2020 1154   BASOSABS 0.1 10/05/2022 1339   BASOSABS 0.1 04/25/2020 1154    Hgb A1C Lab Results  Component Value Date   HGBA1C 5.7 (H) 01/14/2023           Assessment & Plan:   Bilateral hand pain, paresthesia of upper extremities:  OA versus carpal tunnel Will obtain x-ray of bilateral hands today Will check ESR, CRP, ANA, rheumatoid factor, vitamin D, vitamin B12 and magnesium Advised her to  try ibuprofen 400 mg every 8 hours as needed  RTC in 4 months for annual exam Nicki Reaper, NP

## 2023-03-18 NOTE — Telephone Encounter (Signed)
Tried to call patient multiple times today to ask questions about the medications she is requesting and to find out if she has discontinued her Anastrozole as per her PCP. I left a message each time for her to call us back.

## 2023-03-18 NOTE — Assessment & Plan Note (Signed)
She never reduced her levothyroxine to 88 mcg We will repeat TSH today

## 2023-03-19 LAB — T4, FREE: Free T4: 1.1 ng/dL (ref 0.8–1.8)

## 2023-03-19 LAB — SEDIMENTATION RATE: Sed Rate: 17 mm/h (ref 0–20)

## 2023-03-19 LAB — TSH: TSH: 0.07 mIU/L — ABNORMAL LOW

## 2023-03-19 LAB — ANA: Anti Nuclear Antibody (ANA): NEGATIVE

## 2023-03-20 LAB — VITAMIN D 25 HYDROXY (VIT D DEFICIENCY, FRACTURES): Vit D, 25-Hydroxy: 40 ng/mL (ref 30–100)

## 2023-03-20 LAB — BASIC METABOLIC PANEL WITH GFR
BUN: 17 mg/dL (ref 7–25)
CO2: 20 mmol/L (ref 20–32)
Calcium: 9.5 mg/dL (ref 8.6–10.4)
Chloride: 108 mmol/L (ref 98–110)
Creat: 0.86 mg/dL (ref 0.50–1.03)
Glucose, Bld: 98 mg/dL (ref 65–99)
Potassium: 4.1 mmol/L (ref 3.5–5.3)
Sodium: 144 mmol/L (ref 135–146)
eGFR: 82 mL/min/{1.73_m2} (ref >=60)

## 2023-03-20 LAB — MAGNESIUM: Magnesium: 2 mg/dL (ref 1.5–2.5)

## 2023-03-20 LAB — C-REACTIVE PROTEIN: CRP: <3 mg/L (ref <8.0)

## 2023-03-20 LAB — RHEUMATOID FACTOR: Rheumatoid fact SerPl-aCnc: <10 IU/mL (ref <14)

## 2023-03-20 LAB — VITAMIN B12: Vitamin B-12: 428 pg/mL (ref 200–1100)

## 2023-03-22 ENCOUNTER — Encounter: Payer: Self-pay | Admitting: Internal Medicine

## 2023-03-22 ENCOUNTER — Ambulatory Visit: Payer: BC Managed Care – PPO | Admitting: Hematology

## 2023-03-22 ENCOUNTER — Other Ambulatory Visit: Payer: BC Managed Care – PPO

## 2023-03-22 ENCOUNTER — Ambulatory Visit: Payer: BC Managed Care – PPO

## 2023-03-22 ENCOUNTER — Telehealth: Payer: Self-pay | Admitting: Internal Medicine

## 2023-03-22 DIAGNOSIS — E039 Hypothyroidism, unspecified: Secondary | ICD-10-CM

## 2023-03-22 NOTE — Progress Notes (Signed)
The patient has been notified of the result and verbalized understanding.  Patient states she never purposefully triggered the monitor. [place] and may have accidentally hit the button a few times. Patient is going to try OTC magnesium as recommended because she states she does feel frequent PAC's. All questions (if any) were answered. Tommie Sams McClain 03/22/2023 11:47 AM

## 2023-03-22 NOTE — Telephone Encounter (Signed)
Pt returning nurses [facility]) call regarding heart monitor. Please advise.

## 2023-03-23 NOTE — Progress Notes (Unsigned)
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  IMPRESSION: Indeterminate left breast mass at 2 o'clock 7 cm from the nipple. Targeted ultrasound of the left axilla demonstrates normal lymph nodes.   10/31/2020 Pathology Results   Breast, left, needle core biopsy, 2 o'clock - FIBROADIPOSE TISSUE WITH MINIMAL BREAST PARENCHYMA DEMONSTRATING MILD FIBROCYSTIC CHANGE TO INCLUDE APOCRINE METAPLASIA.  This was found to be discordant.   12/18/2020 Surgery   A. BREAST, LEFT, LUMPECTOMY:  - Invasive lobular carcinoma, 1.1 cm, grade 2.  See comment  - Resection margins are negative for carcinoma; inferior margin is  focally less than 1 mm from carcinoma and medial margin is approximately  1 mm from carcinoma  - Biopsy related changes  - Background fibrocystic change   PROGNOSTIC INDICATOR RESULTS:  - The tumor cells are NEGATIVE for Her2 (0).  - Estrogen Receptor:       POSITIVE, 30%, WEAK STAINING   - Progesterone Receptor:   POSITIVE, 80%, MODERATE STAINING  - Proliferation Marker Ki-67:   <5%    12/18/2020 Oncotype testing   Oncotype  Recurrence Score 16  Distant recurrence risk at 9 years of 4% with AI or Tamoxifen alone.  Less than 1% benefit of chemotherapy.    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 LYMPH NODE, LEFT, SENTINEL, EXCISION:  - One lymph node negative for metastatic carcinoma (0/1).   B. LYMPH NODE, LEFT, SENTINEL, EXCISION:  - One lymph node negative for metastatic carcinoma (0/1).   COMMENT:  Immunohistochemistry for cytokeratin AE1/AE3 is performed on parts A and  B and no metastatic carcinoma is identified   02/05/2021 - 03/19/2021 Radiation Therapy   Adjuvant Radiation with Dr Mitzi Hansen    05/13/2021 -  Anti-estrogen oral therapy   Tamoxifen 20mg  once daily starting in September 2022   05/13/2021 Survivorship   SCP delivered by Santiago Glad, NP      CURRENT THERAPY: Anastrozole 09/2022, took for about 3 weeks  INTERVAL HISTORY [name] returns for follow up as scheduled. Last seen by Dr. Mosetta Putt 09/2022 and started anastrozole. She only took it about  3 weeks then stopped due to gradual joint pain. We were not aware she stopped until she sent a message requesting medication for hot flashes, and we saw that PCP had recently dc'd from list as she was not taking it. Brought in today to discuss.   ROS   Past Medical History:  Diagnosis  Date   Allergy    Anemia    present for many years, mild, stable   Anxiety    Breast cancer (HCC)    Breast cancer (HCC) 12/18/2020   Dysrhythmia    PAC's on flecanide (did not tolerate beta blockers)   Family history of breast cancer 01/20/2021   Family history of prostate cancer 01/20/2021   Frequent headaches    HSV infection    fever blisters   Hypertension    Hypothyroidism    Personal history of radiation therapy      Past Surgical History:  Procedure Laterality Date   APPENDECTOMY  2013   BREAST BIOPSY Left 10/2020   BREAST LUMPECTOMY Left 12/18/2020   BREAST LUMPECTOMY WITH RADIOACTIVE SEED LOCALIZATION Left 12/18/2020   Procedure: LEFT BREAST LUMPECTOMY WITH RADIOACTIVE SEED LOCALIZATION;  Surgeon: Harriette Bouillon, MD;  Location: [facility];  Service: General;  Laterality: Left;   KNEE ARTHROSCOPY Right    lapband  2013   SENTINEL NODE BIOPSY Left 01/07/2021   Procedure: LEFT SENTINEL NODE BIOPSY;  Surgeon: Harriette Bouillon, MD;  Location: [facility] OR;  Service: General;  Laterality: Left;   TONSILLECTOMY  2012     Outpatient Encounter Medications as of 03/24/2023  Medication Sig   ALPRAZolam (XANAX) 0.25 MG tablet Take 1 tablet (0.25 mg total) by mouth 2 (two) times daily as needed.   anastrozole (ARIMIDEX) 1 MG tablet TAKE 1 TABLET BY MOUTH EVERY DAY   atorvastatin (LIPITOR) 10 MG tablet TAKE 1 TABLET BY MOUTH EVERY DAY   butalbital-acetaminophen-caffeine (FIORICET) 50-325-40 MG tablet Take 1 tablet by mouth every 6 (six) hours as needed for headache.   cyclobenzaprine (FLEXERIL) 10 MG tablet TAKE 1 TABLET (10 MG TOTAL) BY MOUTH DAILY AS NEEDED (MIGRAINES).   flecainide (TAMBOCOR) 50 MG tablet TAKE 1 TABLET BY MOUTH TWICE A DAY   fluticasone (FLONASE) 50 MCG/ACT nasal spray SPRAY 2 SPRAYS INTO EACH NOSTRIL EVERY DAY (Patient taking differently: Place 2 sprays into both nostrils daily as needed.)   KLOR-CON M10 10 MEQ tablet TAKE 1 TABLET BY MOUTH TWICE A  DAY   levocetirizine (XYZAL) 5 MG tablet TAKE 1 TABLET BY MOUTH EVERY DAY IN THE EVENING   levothyroxine (SYNTHROID) 88 MCG tablet Take 1 tablet (88 mcg total) by mouth daily.   losartan (COZAAR) 50 MG tablet TAKE 1 TABLET BY MOUTH EVERY DAY   omeprazole (PRILOSEC) 20 MG capsule TAKE 1 CAPSULE BY MOUTH EVERY DAY   rizatriptan (MAXALT-MLT) 10 MG disintegrating tablet Take 1 tablet (10 mg total) by mouth as needed for migraine. May repeat in 2 hours if needed   SUMAtriptan (IMITREX) 25 MG tablet TAKE 1 TABLET (25 MG TOTAL) BY MOUTH EVERY 2 (TWO) HOURS AS NEEDED FOR MIGRAINE. MAY REPEAT IN 2 HOURS IF HEADACHE PERSISTS OR RECURS.   valACYclovir (VALTREX) 500 MG tablet TAKE 1 TABLET (500 MG TOTAL) BY MOUTH DAILY AS NEEDED (HERPES SIMPLEX).   venlafaxine XR (EFFEXOR-XR) 75 MG 24 hr capsule Take 1 capsule (75 mg total) by mouth daily with breakfast.   No facility-administered encounter medications on file as of 03/24/2023.     There were no  vitals filed for this visit. There is no height or weight on file to calculate BMI.   PHYSICAL EXAM GENERAL:alert, no distress and comfortable SKIN: no rash  EYES: sclera clear NECK: without mass LYMPH:  no palpable cervical or supraclavicular lymphadenopathy  LUNGS: clear with normal breathing effort HEART: regular rate & rhythm, no lower extremity edema ABDOMEN: abdomen soft, non-tender and normal bowel sounds NEURO: alert & oriented x 3 with fluent speech, no focal motor/sensory deficits Breast exam:  PAC without erythema    CBC    Component Value Date/Time   WBC 4.3 01/14/2023 0816   RBC 4.21 01/14/2023 0816   HGB 12.3 01/14/2023 0816   HGB 12.6 10/05/2022 1339   HGB 10.1 (L) 04/25/2020 1154   HCT 37.6 01/14/2023 0816   HCT 31.6 (L) 04/25/2020 1154   PLT 275 01/14/2023 0816   PLT 223 10/05/2022 1339   PLT 261 04/25/2020 1154   MCV 89.3 01/14/2023 0816   MCV 80 04/25/2020 1154   MCH 29.2 01/14/2023 0816   MCHC 32.7 01/14/2023 0816   RDW  14.4 01/14/2023 0816   RDW 16.8 (H) 04/25/2020 1154   LYMPHSABS 1.3 10/05/2022 1339   LYMPHSABS 1.4 04/25/2020 1154   MONOABS 0.3 10/05/2022 1339   EOSABS 0.1 10/05/2022 1339   EOSABS 0.0 04/25/2020 1154   BASOSABS 0.1 10/05/2022 1339   BASOSABS 0.1 04/25/2020 1154     CMP     Component Value Date/Time   NA 144 03/18/2023 1512   NA 143 01/26/2020 1600   K 4.1 03/18/2023 1512   CL 108 03/18/2023 1512   CO2 20 03/18/2023 1512   GLUCOSE 98 03/18/2023 1512   BUN 17 03/18/2023 1512   BUN 11 01/26/2020 1600   CREATININE 0.86 03/18/2023 1512   CALCIUM 9.5 03/18/2023 1512   PROT 6.8 01/14/2023 0816   ALBUMIN 3.9 10/05/2022 1339   AST 12 01/14/2023 0816   AST 13 (L) 10/05/2022 1339   ALT 8 01/14/2023 0816   ALT 9 10/05/2022 1339   ALKPHOS 104 10/05/2022 1339   BILITOT 0.3 01/14/2023 0816   BILITOT 0.5 10/05/2022 1339   GFRNONAA >60 10/05/2022 1339   GFRAA >60 02/25/2020 2231     ASSESSMENT & PLAN:[name] is a 52 y.o. 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 Biopsy of the left breast mass on 10/31/20 was again benign but felt to be discordant. -left lumpectomy under Dr. Luisa Hart on 12/18/20 showed invasive lobular carcinoma, 1.1 cm, grade 2, negative resection margins. ER+ (weakly), PR+, Her2-. RS 16 with less than 1% benefit of chemotherapy.  -Her 01/07/21 Sentinel LN biopsy was negative.  -Her genetic testing from 01/16/21 was negative for pathogenetic mutations.  -S/p adjuvant radiation with Dr. Mitzi Hansen on 02/05/21 - 03/19/21.  -Tamoxifen prescribed in April 2023 but did not start; [facility] 10/05/22 confirmed menopause and she switched to Anastrozole. However, due to joint pain and hot flashes, she only took it for 3 weeks and did not notify us   B12 deficiency anemia Secondary to  prior bariatric surgery; started her on B12 injections from 01/29/21, continue monthly  -she has not had B12 injection after 12/2021, restarted 10/2022   Iron deficiency anemia -Secondary to menorrhagia  -she required 3 doses of IV Venofer in 02/2021 and 10/2021  PLAN:  No orders of the defined types were placed in this encounter.     All questions were answered. The patient knows to call the clinic with any problems, questions or concerns. No barriers to learning were detected. I spent *** counseling the patient face to face. The total time spent in the appointment was *** and more than 50% was on counseling, review of test results, and coordination of care.   Santiago Glad, NP-C @DATE @

## 2023-03-24 ENCOUNTER — Inpatient Hospital Stay: Payer: BC Managed Care – PPO

## 2023-03-24 ENCOUNTER — Inpatient Hospital Stay: Payer: BC Managed Care – PPO | Attending: Hematology

## 2023-03-24 ENCOUNTER — Inpatient Hospital Stay (HOSPITAL_BASED_OUTPATIENT_CLINIC_OR_DEPARTMENT_OTHER): Payer: BC Managed Care – PPO | Admitting: Nurse Practitioner

## 2023-03-24 ENCOUNTER — Encounter: Payer: Self-pay | Admitting: Nurse Practitioner

## 2023-03-24 ENCOUNTER — Other Ambulatory Visit: Payer: Self-pay

## 2023-03-24 VITALS — BP 139/81 | HR 88 | Temp 97.7°F | Resp 17 | Wt 216.3 lb

## 2023-03-24 DIAGNOSIS — E538 Deficiency of other specified B group vitamins: Secondary | ICD-10-CM | POA: Diagnosis not present

## 2023-03-24 DIAGNOSIS — Z923 Personal history of irradiation: Secondary | ICD-10-CM | POA: Insufficient documentation

## 2023-03-24 DIAGNOSIS — Z170 Estrogen receptor positive status [ER+]: Secondary | ICD-10-CM

## 2023-03-24 DIAGNOSIS — N920 Excessive and frequent menstruation with regular cycle: Secondary | ICD-10-CM | POA: Diagnosis not present

## 2023-03-24 DIAGNOSIS — D509 Iron deficiency anemia, unspecified: Secondary | ICD-10-CM | POA: Diagnosis not present

## 2023-03-24 DIAGNOSIS — C50412 Malignant neoplasm of upper-outer quadrant of left female breast: Secondary | ICD-10-CM

## 2023-03-24 DIAGNOSIS — E876 Hypokalemia: Secondary | ICD-10-CM | POA: Insufficient documentation

## 2023-03-24 DIAGNOSIS — D519 Vitamin B12 deficiency anemia, unspecified: Secondary | ICD-10-CM

## 2023-03-24 LAB — CMP (CANCER CENTER ONLY)
ALT: 8 U/L (ref 0–44)
AST: 11 U/L — ABNORMAL LOW (ref 15–41)
Albumin: 3.8 g/dL (ref 3.5–5.0)
Alkaline Phosphatase: 116 U/L (ref 38–126)
Anion gap: 9 (ref 5–15)
BUN: 12 mg/dL (ref 6–20)
CO2: 27 mmol/L (ref 22–32)
Calcium: 9.2 mg/dL (ref 8.9–10.3)
Chloride: 104 mmol/L (ref 98–111)
Creatinine: 0.96 mg/dL (ref 0.44–1.00)
GFR, Estimated: >60 mL/min (ref >60)
Glucose, Bld: 135 mg/dL — ABNORMAL HIGH (ref 70–99)
Potassium: 3.1 mmol/L — ABNORMAL LOW (ref 3.5–5.1)
Sodium: 140 mmol/L (ref 135–145)
Total Bilirubin: 0.4 mg/dL (ref 0.3–1.2)
Total Protein: 6.8 g/dL (ref 6.5–8.1)

## 2023-03-24 LAB — CBC WITH DIFFERENTIAL (CANCER CENTER ONLY)
Abs Immature Granulocytes: 0.01 10*3/uL (ref 0.00–0.07)
Basophils Absolute: 0.1 10*3/uL (ref 0.0–0.1)
Basophils Relative: 2 %
Eosinophils Absolute: 0.1 10*3/uL (ref 0.0–0.5)
Eosinophils Relative: 2 %
HCT: 32.4 % — ABNORMAL LOW (ref 36.0–46.0)
Hemoglobin: 11.4 g/dL — ABNORMAL LOW (ref 12.0–15.0)
Immature Granulocytes: 0 %
Lymphocytes Relative: 25 %
Lymphs Abs: 1 10*3/uL (ref 0.7–4.0)
MCH: 30.6 pg (ref 26.0–34.0)
MCHC: 35.2 g/dL (ref 30.0–36.0)
MCV: 86.9 fL (ref 80.0–100.0)
Monocytes Absolute: 0.3 10*3/uL (ref 0.1–1.0)
Monocytes Relative: 8 %
Neutro Abs: 2.6 10*3/uL (ref 1.7–7.7)
Neutrophils Relative %: 63 %
Platelet Count: 216 10*3/uL (ref 150–400)
RBC: 3.73 MIL/uL — ABNORMAL LOW (ref 3.87–5.11)
RDW: 13.6 % (ref 11.5–15.5)
WBC Count: 4.1 10*3/uL (ref 4.0–10.5)
nRBC: 0 % (ref 0.0–0.2)

## 2023-03-24 LAB — IRON AND IRON BINDING CAPACITY (CC-WL,HP ONLY)
Iron: 58 ug/dL (ref 28–170)
Saturation Ratios: 17 % (ref 10.4–31.8)
TIBC: 336 ug/dL (ref 250–450)
UIBC: 278 ug/dL (ref 148–442)

## 2023-03-24 LAB — FERRITIN: Ferritin: 102 ng/mL (ref 11–307)

## 2023-03-24 MED ORDER — EXEMESTANE 25 MG PO TABS: 25.0000 mg | ORAL_TABLET | Freq: Every day | ORAL | 6 refills | Status: DC

## 2023-03-24 MED ORDER — CYANOCOBALAMIN 1000 MCG/ML IJ SOLN
1000.0000 ug | Freq: Once | INTRAMUSCULAR | Status: AC
  Administered 2023-03-24: 1000 ug via INTRAMUSCULAR
  Filled 2023-03-24: qty 1

## 2023-03-26 ENCOUNTER — Encounter: Payer: Self-pay | Admitting: Nurse Practitioner

## 2023-03-29 ENCOUNTER — Other Ambulatory Visit: Payer: BC Managed Care – PPO

## 2023-03-29 ENCOUNTER — Ambulatory Visit: Payer: BC Managed Care – PPO | Admitting: Hematology

## 2023-03-29 ENCOUNTER — Ambulatory Visit: Payer: BC Managed Care – PPO

## 2023-04-08 ENCOUNTER — Encounter: Payer: Self-pay | Admitting: Nurse Practitioner

## 2023-04-12 ENCOUNTER — Other Ambulatory Visit: Payer: Self-pay | Admitting: Nurse Practitioner

## 2023-04-12 DIAGNOSIS — R232 Flushing: Secondary | ICD-10-CM

## 2023-04-12 MED ORDER — VEOZAH 45 MG PO TABS
45.0000 mg | ORAL_TABLET | Freq: Every day | ORAL | 1 refills | Status: AC
Start: 2023-04-12 — End: ?

## 2023-04-16 ENCOUNTER — Other Ambulatory Visit: Payer: Self-pay

## 2023-04-16 ENCOUNTER — Telehealth: Payer: Self-pay | Admitting: *Deleted

## 2023-04-16 NOTE — Telephone Encounter (Signed)
[place]  45 mg tablet Medication Prior Authorization Status  Processed CoverMyMeds KEY: [id number]  Status pending sent today  Per [employer] [phone]) Case ID: [id number]   Effective pending through pending

## 2023-04-18 ENCOUNTER — Other Ambulatory Visit: Payer: Self-pay | Admitting: Internal Medicine

## 2023-04-20 NOTE — Telephone Encounter (Signed)
Requested Prescriptions  Pending Prescriptions Disp Refills   levothyroxine (SYNTHROID) 88 MCG tablet [Pharmacy Med Name: LEVOTHYROXINE 88 MCG TABLET] 90 tablet 0    Sig: TAKE 1 TABLET BY MOUTH EVERY DAY     Endocrinology:  Hypothyroid Agents Failed - 04/18/2023  8:43 AM      Failed - TSH in normal range and within 360 days    TSH  Date Value Ref Range Status  03/18/2023 0.07 (L) mIU/L Final    Comment:              Reference Range .           > or = 20 Years  0.40-4.50 .                Pregnancy Ranges           First trimester    0.26-2.66           Second trimester   0.55-2.73           Third trimester    0.43-2.91          Passed - Valid encounter within last 12 months    Recent Outpatient Visits           1 month ago Bilateral hand pain   [place] [facility] [place], [place], NP   3 months ago Acute neck pain   [place] [facility] [place], Salvadore Oxford, NP   3 months ago Acquired hypothyroidism   [place] [facility] [place], Salvadore Oxford, NP   8 months ago Flu-like symptoms   [place] [facility] [place], [place], NP   9 months ago Chronic migraine without aura with status migrainosus, not intractable   [place] [facility] [place], Salvadore Oxford, NP       Future Appointments             In 2 months Sampson Si, Salvadore Oxford, NP [place] [facility]             valACYclovir (VALTREX) 500 MG tablet [Pharmacy Med Name: VALACYCLOVIR HCL 500 MG TABLET] 90 tablet 0    Sig: TAKE 1 TABLET (500 MG TOTAL) BY MOUTH DAILY AS NEEDED (HERPES SIMPLEX).     Antimicrobials:  Antiviral Agents - Anti-Herpetic Passed - 04/18/2023  8:43 AM      Passed - Valid encounter within last 12 months    Recent Outpatient Visits           1 month ago Bilateral hand pain   [place] [facility] [place], Salvadore Oxford, NP   3 months ago Acute neck pain   [place] [facility] [place], Salvadore Oxford, NP   3 months ago Acquired hypothyroidism   [place] [facility] [place], Salvadore Oxford, NP   8 months ago Flu-like symptoms   [place] [facility] [place], [place], NP   9 months ago Chronic migraine without aura with status migrainosus, not intractable   [place] [facility] [place], Salvadore Oxford, NP       Future Appointments             In 2 months Baity, Salvadore Oxford, NP [place] [facility]

## 2023-04-29 ENCOUNTER — Encounter: Payer: Self-pay | Admitting: Nurse Practitioner

## 2023-04-29 ENCOUNTER — Other Ambulatory Visit: Payer: Self-pay | Admitting: Internal Medicine

## 2023-04-29 ENCOUNTER — Other Ambulatory Visit: Payer: Self-pay | Admitting: Nurse Practitioner

## 2023-04-29 MED ORDER — ALPRAZOLAM 0.25 MG PO TABS: 0.2500 mg | ORAL_TABLET | Freq: Two times a day (BID) | ORAL | 0 refills | Status: DC | PRN

## 2023-04-29 MED ORDER — GABAPENTIN 300 MG PO CAPS: 300.0000 mg | ORAL_CAPSULE | Freq: Two times a day (BID) | ORAL | 1 refills | Status: DC

## 2023-05-13 ENCOUNTER — Telehealth: Payer: Self-pay | Admitting: Hematology

## 2023-05-13 ENCOUNTER — Encounter: Payer: Self-pay | Admitting: Hematology

## 2023-05-19 ENCOUNTER — Inpatient Hospital Stay: Payer: BC Managed Care – PPO | Attending: Hematology

## 2023-05-19 ENCOUNTER — Telehealth: Payer: Self-pay | Admitting: Hematology

## 2023-05-20 ENCOUNTER — Inpatient Hospital Stay: Payer: BC Managed Care – PPO | Attending: Hematology

## 2023-05-21 ENCOUNTER — Inpatient Hospital Stay: Payer: BC Managed Care – PPO | Attending: Hematology

## 2023-06-16 ENCOUNTER — Inpatient Hospital Stay: Payer: BC Managed Care – PPO

## 2023-06-16 ENCOUNTER — Inpatient Hospital Stay: Payer: BC Managed Care – PPO | Attending: Hematology

## 2023-06-25 ENCOUNTER — Ambulatory Visit
Admission: RE | Admit: 2023-06-25 | Discharge: 2023-06-25 | Disposition: A | Discharge status: Home / Self Care | Payer: BC Managed Care – PPO | Source: Ambulatory Visit | Attending: Nurse Practitioner | Admitting: Nurse Practitioner

## 2023-06-25 ENCOUNTER — Other Ambulatory Visit: Payer: Self-pay | Admitting: Nurse Practitioner

## 2023-06-25 DIAGNOSIS — C50412 Malignant neoplasm of upper-outer quadrant of left female breast: Secondary | ICD-10-CM

## 2023-06-25 MED ORDER — GADOPICLENOL 0.5 MMOL/ML IV SOLN
10.0000 mL | Freq: Once | INTRAVENOUS | Status: AC | PRN
  Administered 2023-06-25: 10 mL via INTRAVENOUS

## 2023-07-05 ENCOUNTER — Telehealth: Payer: Self-pay | Admitting: Internal Medicine

## 2023-07-05 ENCOUNTER — Encounter: Payer: Self-pay | Admitting: Nurse Practitioner

## 2023-07-05 ENCOUNTER — Encounter: Payer: Self-pay | Admitting: Internal Medicine

## 2023-07-05 NOTE — Telephone Encounter (Signed)
There are multiple open appointments tomorrow.  Please schedule her in one of these appointments.

## 2023-07-05 NOTE — Telephone Encounter (Signed)
Pt has migraine since Friday with no relief  she is asking for an appt today with Rene Kocher but there is not an appt until Friday.  CB@  [phone]

## 2023-07-06 ENCOUNTER — Ambulatory Visit: Payer: BC Managed Care – PPO | Admitting: Internal Medicine

## 2023-07-06 ENCOUNTER — Encounter: Payer: Self-pay | Admitting: Internal Medicine

## 2023-07-06 VITALS — BP 138/80 | Ht 64.75 in | Wt 208.0 lb

## 2023-07-06 DIAGNOSIS — G43701 Chronic migraine without aura, not intractable, with status migrainosus: Secondary | ICD-10-CM

## 2023-07-06 DIAGNOSIS — G43909 Migraine, unspecified, not intractable, without status migrainosus: Secondary | ICD-10-CM | POA: Diagnosis not present

## 2023-07-06 MED ORDER — KETOROLAC TROMETHAMINE 30 MG/ML IJ SOLN
30.0000 mg | Freq: Once | INTRAMUSCULAR | Status: AC
  Administered 2023-07-06: 30 mg via INTRAMUSCULAR

## 2023-07-06 MED ORDER — NURTEC 75 MG PO TBDP: 75.0000 mg | ORAL_TABLET | Freq: Every day | ORAL | 2 refills | Status: AC | PRN

## 2023-07-06 NOTE — Progress Notes (Signed)
Subjective:    Patient ID: [name], female    DOB: 22-Feb-1971, 52 y.o.   MRN: [id number]  HPI  Discussed the use of AI scribe software for clinical note transcription with the patient, who gave verbal consent to proceed.   The patient, with a history of chronic migraines, presents with a severe migraine that started last Thursday and has persisted for five days. The pain is described as throbbing, primarily located on the left side of the head, extending from the base of the neck to the forehead. Accompanying symptoms include light sensitivity and twitching of the right eye, a symptom that has been present since a previous concussion. The patient denies significant sound sensitivity but reports mild dizziness and nausea without vomiting.  The patient has tried multiple medications for this episode, including Fioricet, a muscle relaxer, Imitrex, Maxalt, Tylenol, and ibuprofen, but none have provided relief. The patient is not currently on any preventative migraine medication. The frequency of migraines has increased recently to about once a month, and the patient notes that she has become more severe and persistent. The patient denies any significant recent changes in stress levels and speculates that hormonal changes might be contributing to the increased frequency and severity of migraines.  The patient has a history of various types of headaches, including tension and cluster headaches, and has tried a variety of treatments in the past, including oxygen therapy. The patient has not seen a neurologist for a while but is considering doing so given the recent increase in migraine severity and frequency. The patient has a copper intrauterine device and does not have periods. The patient denies any recent changes in stress levels and has been engaging in regular outdoor activities.       Review of Systems     Past Medical History:  Diagnosis Date   Allergy    Anemia    present for  many years, mild, stable   Anxiety    Breast cancer (HCC)    Breast cancer (HCC) 12/18/2020   Dysrhythmia    PAC's on flecanide (did not tolerate beta blockers)   Family history of breast cancer 01/20/2021   Family history of prostate cancer 01/20/2021   Frequent headaches    HSV infection    fever blisters   Hypertension    Hypothyroidism    Personal history of radiation therapy     Current Outpatient Medications  Medication Sig Dispense Refill   ALPRAZolam (XANAX) 0.25 MG tablet Take 1 tablet (0.25 mg total) by mouth 2 (two) times daily as needed. 20 tablet 0   atorvastatin (LIPITOR) 10 MG tablet TAKE 1 TABLET BY MOUTH EVERY DAY 90 tablet 1   butalbital-acetaminophen-caffeine (FIORICET) 50-325-40 MG tablet Take 1 tablet by mouth every 6 (six) hours as needed for headache. 20 tablet 5   cyclobenzaprine (FLEXERIL) 10 MG tablet TAKE 1 TABLET (10 MG TOTAL) BY MOUTH DAILY AS NEEDED (MIGRAINES). 15 tablet 5   exemestane (AROMASIN) 25 MG tablet Take 1 tablet (25 mg total) by mouth daily after breakfast. 30 tablet 6   Fezolinetant (VEOZAH) 45 MG TABS Take 1 tablet (45 mg total) by mouth daily. 30 tablet 1   flecainide (TAMBOCOR) 50 MG tablet TAKE 1 TABLET BY MOUTH TWICE A DAY 180 tablet 3   fluticasone (FLONASE) 50 MCG/ACT nasal spray SPRAY 2 SPRAYS INTO EACH NOSTRIL EVERY DAY (Patient taking differently: Place 2 sprays into both nostrils daily as needed.) 48 mL 0   gabapentin (NEURONTIN) 300  MG capsule Take 1 capsule (300 mg total) by mouth 2 (two) times daily. 60 capsule 1   KLOR-CON M10 10 MEQ tablet TAKE 1 TABLET BY MOUTH TWICE A DAY 180 tablet 1   levocetirizine (XYZAL) 5 MG tablet TAKE 1 TABLET BY MOUTH EVERY DAY IN THE EVENING 90 tablet 3   levothyroxine (SYNTHROID) 88 MCG tablet TAKE 1 TABLET BY MOUTH EVERY DAY 90 tablet 0   losartan (COZAAR) 50 MG tablet TAKE 1 TABLET BY MOUTH EVERY DAY 90 tablet 1   omeprazole (PRILOSEC) 20 MG capsule TAKE 1 CAPSULE BY MOUTH EVERY DAY 90 capsule 1    rizatriptan (MAXALT-MLT) 10 MG disintegrating tablet Take 1 tablet (10 mg total) by mouth as needed for migraine. May repeat in 2 hours if needed 10 tablet 1   SUMAtriptan (IMITREX) 25 MG tablet TAKE 1 TABLET (25 MG TOTAL) BY MOUTH EVERY 2 (TWO) HOURS AS NEEDED FOR MIGRAINE. MAY REPEAT IN 2 HOURS IF HEADACHE PERSISTS OR RECURS. 10 tablet 0   valACYclovir (VALTREX) 500 MG tablet TAKE 1 TABLET (500 MG TOTAL) BY MOUTH DAILY AS NEEDED (HERPES SIMPLEX). 90 tablet 0   venlafaxine XR (EFFEXOR-XR) 75 MG 24 hr capsule TAKE 1 CAPSULE BY MOUTH DAILY WITH BREAKFAST. 90 capsule 0   No current facility-administered medications for this visit.    Allergies  Allergen Reactions   Ciprofloxacin     anxiety   Keflex [Cephalexin] Hives   Trazodone And Nefazodone Other (See Comments)    nightmares    Family History  Problem Relation Age of Onset   Depression Mother    Breast cancer Mother 89   Hyperlipidemia Father    Prostate cancer Father 46    Social History   Socioeconomic History   Marital status: Single    Spouse name: Not on file   Number of children: 1   Years of education: Not on file   Highest education level: Bachelor's degree (e.g., BA, AB, BS)  Occupational History   Not on file  Tobacco Use   Smoking status: Never   Smokeless tobacco: Never  Vaping Use   Vaping status: Never Used  Substance and Sexual Activity   Alcohol use: Yes    Comment: occasional   Drug use: No   Sexual activity: Yes    Partners: Male    Birth control/protection: Pill  Other Topics Concern   Not on file  Social History Narrative   Not on file   Social Determinants of Health   Financial Resource Strain: Low Risk  (01/14/2023)   Overall Financial Resource Strain (CARDIA)    Difficulty of Paying Living Expenses: Not hard at all  Food Insecurity: No Food Insecurity (01/14/2023)   Hunger Vital Sign    Worried About Running Out of Food in the Last Year: Never true    Ran Out of Food in the Last  Year: Never true  Transportation Needs: No Transportation Needs (01/14/2023)   PRAPARE - Administrator, Civil Service (Medical): No    Lack of Transportation (Non-Medical): No  Physical Activity: Insufficiently Active (01/14/2023)   Exercise Vital Sign    Days of Exercise per Week: 2 days    Minutes of Exercise per Session: 30 min  Stress: No Stress Concern Present (01/14/2023)   [employer] of Occupational Health - Occupational Stress Questionnaire    Feeling of Stress : Not at all  Social Connections: Socially Isolated (01/14/2023)   Social Connection and Isolation Panel [NHANES]  Frequency of Communication with Friends and Family: Once a week    Frequency of Social Gatherings with Friends and Family: Twice a week    Attends Religious Services: Never    Database administrator or Organizations: No    Attends Engineer, structural: Not on file    Marital Status: Never married  Intimate Partner Violence: Not At Risk (01/02/2021)   Humiliation, Afraid, Rape, and Kick questionnaire    Fear of Current or Ex-Partner: No    Emotionally Abused: No    Physically Abused: No    Sexually Abused: No     Constitutional: Patient reports headache.  Denies fever, malaise, fatigue, or abrupt weight changes.  HEENT: Denies eye pain, eye redness, ear pain, ringing in the ears, wax buildup, runny nose, nasal congestion, bloody nose, or sore throat. Respiratory: Denies difficulty breathing, shortness of breath, cough or sputum production.   Cardiovascular: Denies chest pain, chest tightness, palpitations or swelling in the hands or feet.  Gastrointestinal: Patient reports nausea.  Denies abdominal pain, bloating, constipation, diarrhea or blood in the stool.  Musculoskeletal: Denies decrease in range of motion, difficulty with gait, muscle pain or joint pain and swelling.  Skin: Denies redness, rashes, lesions or ulcercations.  Neurological: Patient reports sensitivity to light,  dizziness.  Denies difficulty with memory, difficulty with speech or problems with balance and coordination.    No other specific complaints in a complete review of systems (except as listed in HPI above).  Objective:   Physical Exam   BP 138/80 (BP Location: Right Arm)   Ht 5' 4.75" (1.645 m)   Wt 208 lb (94.3 kg)   BMI 34.88 kg/m   Wt Readings from Last 3 Encounters:  03/24/23 216 lb 4.8 oz (98.1 kg)  03/18/23 214 lb (97.1 kg)  02/22/23 218 lb (98.9 kg)    General: Appears her stated age, obese, in NAD. HEENT: Eyes: EOMs intact;  Cardiovascular: Normal rate. Pulmonary/Chest: Normal effort and positive vesicular breath sounds.  Neurological: Alert and oriented.  Coordination normal.    BMET    Component Value Date/Time   NA 140 03/24/2023 1003   NA 143 01/26/2020 1600   K 3.1 (L) 03/24/2023 1003   CL 104 03/24/2023 1003   CO2 27 03/24/2023 1003   GLUCOSE 135 (H) 03/24/2023 1003   BUN 12 03/24/2023 1003   BUN 11 01/26/2020 1600   CREATININE 0.96 03/24/2023 1003   CREATININE 0.86 03/18/2023 1512   CALCIUM 9.2 03/24/2023 1003   GFRNONAA >60 03/24/2023 1003   GFRAA >60 02/25/2020 2231    Lipid Panel     Component Value Date/Time   CHOL 220 (H) 01/14/2023 0816   TRIG 142 01/14/2023 0816   HDL 51 01/14/2023 0816   CHOLHDL 4.3 01/14/2023 0816   VLDL 18.8 12/21/2019 1204   LDLCALC 142 (H) 01/14/2023 0816    CBC    Component Value Date/Time   WBC 4.1 03/24/2023 1003   WBC 4.3 01/14/2023 0816   RBC 3.73 (L) 03/24/2023 1003   HGB 11.4 (L) 03/24/2023 1003   HGB 10.1 (L) 04/25/2020 1154   HCT 32.4 (L) 03/24/2023 1003   HCT 31.6 (L) 04/25/2020 1154   PLT 216 03/24/2023 1003   PLT 261 04/25/2020 1154   MCV 86.9 03/24/2023 1003   MCV 80 04/25/2020 1154   MCH 30.6 03/24/2023 1003   MCHC 35.2 03/24/2023 1003   RDW 13.6 03/24/2023 1003   RDW 16.8 (H) 04/25/2020 1154   LYMPHSABS  1.0 03/24/2023 1003   LYMPHSABS 1.4 04/25/2020 1154   MONOABS 0.3 03/24/2023 1003    EOSABS 0.1 03/24/2023 1003   EOSABS 0.0 04/25/2020 1154   BASOSABS 0.1 03/24/2023 1003   BASOSABS 0.1 04/25/2020 1154    Hgb A1C Lab Results  Component Value Date   HGBA1C 5.7 (H) 01/14/2023           Assessment & Plan:   Assessment and Plan    Episodic Migraines Increased frequency and severity of migraines, with associated photophobia and right eye twitching. Failed response to Fioricet, muscle relaxer, Imitrex, XR, Tylenol, and ibuprofen. No current preventative therapy. -Administer 30mg  of Toradol today. -Referral to neurology for further management. -Nurtec sample given today, Rx sent to pharmacy.   RTC in 1 month for your annual exam Nicki Reaper, NP

## 2023-07-06 NOTE — Patient Instructions (Signed)

## 2023-07-12 ENCOUNTER — Encounter: Payer: BC Managed Care – PPO | Admitting: Internal Medicine

## 2023-07-13 ENCOUNTER — Ambulatory Visit (INDEPENDENT_AMBULATORY_CARE_PROVIDER_SITE_OTHER): Payer: BC Managed Care – PPO | Admitting: Internal Medicine

## 2023-07-13 ENCOUNTER — Other Ambulatory Visit: Payer: Self-pay | Admitting: Nurse Practitioner

## 2023-07-13 ENCOUNTER — Inpatient Hospital Stay: Payer: BC Managed Care – PPO | Attending: Hematology

## 2023-07-13 VITALS — BP 122/80 | HR 79 | Ht 64.75 in | Wt 207.0 lb

## 2023-07-13 VITALS — BP 139/80 | HR 80 | Resp 18

## 2023-07-13 DIAGNOSIS — D519 Vitamin B12 deficiency anemia, unspecified: Secondary | ICD-10-CM

## 2023-07-13 DIAGNOSIS — E66811 Obesity, class 1: Secondary | ICD-10-CM

## 2023-07-13 DIAGNOSIS — E7800 Pure hypercholesterolemia, unspecified: Secondary | ICD-10-CM | POA: Diagnosis not present

## 2023-07-13 DIAGNOSIS — Z6834 Body mass index (BMI) 34.0-34.9, adult: Secondary | ICD-10-CM

## 2023-07-13 DIAGNOSIS — E538 Deficiency of other specified B group vitamins: Secondary | ICD-10-CM | POA: Diagnosis present

## 2023-07-13 DIAGNOSIS — E6609 Other obesity due to excess calories: Secondary | ICD-10-CM

## 2023-07-13 DIAGNOSIS — E039 Hypothyroidism, unspecified: Secondary | ICD-10-CM

## 2023-07-13 DIAGNOSIS — R7303 Prediabetes: Secondary | ICD-10-CM | POA: Diagnosis not present

## 2023-07-13 DIAGNOSIS — Z0001 Encounter for general adult medical examination with abnormal findings: Secondary | ICD-10-CM

## 2023-07-13 MED ORDER — CYANOCOBALAMIN 1000 MCG/ML IJ SOLN
1000.0000 ug | Freq: Once | INTRAMUSCULAR | Status: AC
  Administered 2023-07-13: 1000 ug via INTRAMUSCULAR
  Filled 2023-07-13: qty 1

## 2023-07-13 MED ORDER — CYANOCOBALAMIN 1000 MCG/ML IJ SOLN: 1000.0000 ug | INTRAMUSCULAR | 6 refills | Status: DC

## 2023-07-13 NOTE — Patient Instructions (Signed)

## 2023-07-13 NOTE — Progress Notes (Signed)
Subjective:    Patient ID: [name], female    DOB: July 20, 1971, 52 y.o.   MRN: [id number]  HPI  Patient presents to clinic today for her annual exam.  Flu: Never Tetanus: 07/2016 COVID: X 2 Shingrix: Never Pap smear: 2024, [facility] Mammogram: 12/2022 Colon screening: 10/2022, [facility] Vision screening: as needed Dentist: biannually  Diet: She does eat meat. She consumes veggies, no fruits. She does eat some fried foods. She drinks mostly water, tea Exercise: Walking   Review of Systems     Past Medical History:  Diagnosis Date   Allergy    Anemia    present for many years, mild, stable   Anxiety    Breast cancer (HCC)    Breast cancer (HCC) 12/18/2020   Dysrhythmia    PAC's on flecanide (did not tolerate beta blockers)   Family history of breast cancer 01/20/2021   Family history of prostate cancer 01/20/2021   Frequent headaches    HSV infection    fever blisters   Hypertension    Hypothyroidism    Personal history of radiation therapy     Current Outpatient Medications  Medication Sig Dispense Refill   ALPRAZolam (XANAX) 0.25 MG tablet Take 1 tablet (0.25 mg total) by mouth 2 (two) times daily as needed. 20 tablet 0   atorvastatin (LIPITOR) 10 MG tablet TAKE 1 TABLET BY MOUTH EVERY DAY 90 tablet 1   butalbital-acetaminophen-caffeine (FIORICET) 50-325-40 MG tablet Take 1 tablet by mouth every 6 (six) hours as needed for headache. 20 tablet 5   cyclobenzaprine (FLEXERIL) 10 MG tablet TAKE 1 TABLET (10 MG TOTAL) BY MOUTH DAILY AS NEEDED (MIGRAINES). 15 tablet 5   exemestane (AROMASIN) 25 MG tablet Take 1 tablet (25 mg total) by mouth daily after breakfast. 30 tablet 6   Fezolinetant (VEOZAH) 45 MG TABS Take 1 tablet (45 mg total) by mouth daily. 30 tablet 1   flecainide (TAMBOCOR) 50 MG tablet TAKE 1 TABLET BY MOUTH TWICE A DAY 180 tablet 3   fluticasone (FLONASE) 50 MCG/ACT nasal spray SPRAY 2 SPRAYS INTO EACH NOSTRIL EVERY DAY (Patient  taking differently: Place 2 sprays into both nostrils daily as needed.) 48 mL 0   gabapentin (NEURONTIN) 300 MG capsule Take 1 capsule (300 mg total) by mouth 2 (two) times daily. 60 capsule 1   KLOR-CON M10 10 MEQ tablet TAKE 1 TABLET BY MOUTH TWICE A DAY 180 tablet 1   levocetirizine (XYZAL) 5 MG tablet TAKE 1 TABLET BY MOUTH EVERY DAY IN THE EVENING 90 tablet 3   levothyroxine (SYNTHROID) 88 MCG tablet TAKE 1 TABLET BY MOUTH EVERY DAY 90 tablet 0   losartan (COZAAR) 50 MG tablet TAKE 1 TABLET BY MOUTH EVERY DAY 90 tablet 1   NURTEC 75 MG TBDP Take 1 tablet (75 mg total) by mouth daily as needed (migraine headache). Max 1 tablet in 24 hours. 8 tablet 2   omeprazole (PRILOSEC) 20 MG capsule TAKE 1 CAPSULE BY MOUTH EVERY DAY 90 capsule 1   valACYclovir (VALTREX) 500 MG tablet TAKE 1 TABLET (500 MG TOTAL) BY MOUTH DAILY AS NEEDED (HERPES SIMPLEX). 90 tablet 0   venlafaxine XR (EFFEXOR-XR) 75 MG 24 hr capsule TAKE 1 CAPSULE BY MOUTH DAILY WITH BREAKFAST. 90 capsule 0   No current facility-administered medications for this visit.    Allergies  Allergen Reactions   Ciprofloxacin     anxiety   Keflex [Cephalexin] Hives   Trazodone And Nefazodone Other (See Comments)  nightmares    Family History  Problem Relation Age of Onset   Depression Mother    Breast cancer Mother 72   Hyperlipidemia Father    Prostate cancer Father 68    Social History   Socioeconomic History   Marital status: Single    Spouse name: Not on file   Number of children: 1   Years of education: Not on file   Highest education level: Bachelor's degree (e.g., BA, AB, BS)  Occupational History   Not on file  Tobacco Use   Smoking status: Never   Smokeless tobacco: Never  Vaping Use   Vaping status: Never Used  Substance and Sexual Activity   Alcohol use: Yes    Comment: occasional   Drug use: No   Sexual activity: Yes    Partners: Male    Birth control/protection: Pill  Other Topics Concern   Not on  file  Social History Narrative   Not on file   Social Determinants of Health   Financial Resource Strain: Low Risk  (01/14/2023)   Overall Financial Resource Strain (CARDIA)    Difficulty of Paying Living Expenses: Not hard at all  Food Insecurity: No Food Insecurity (01/14/2023)   Hunger Vital Sign    Worried About Running Out of Food in the Last Year: Never true    Ran Out of Food in the Last Year: Never true  Transportation Needs: No Transportation Needs (01/14/2023)   PRAPARE - Administrator, Civil Service (Medical): No    Lack of Transportation (Non-Medical): No  Physical Activity: Insufficiently Active (01/14/2023)   Exercise Vital Sign    Days of Exercise per Week: 2 days    Minutes of Exercise per Session: 30 min  Stress: No Stress Concern Present (01/14/2023)   [employer] of Occupational Health - Occupational Stress Questionnaire    Feeling of Stress : Not at all  Social Connections: Socially Isolated (01/14/2023)   Social Connection and Isolation Panel [NHANES]    Frequency of Communication with Friends and Family: Once a week    Frequency of Social Gatherings with Friends and Family: Twice a week    Attends Religious Services: Never    Database administrator or Organizations: No    Attends Engineer, structural: Not on file    Marital Status: Never married  Intimate Partner Violence: Not At Risk (01/02/2021)   Humiliation, Afraid, Rape, and Kick questionnaire    Fear of Current or Ex-Partner: No    Emotionally Abused: No    Physically Abused: No    Sexually Abused: No     Constitutional: Patient reports intermittent headaches.  Denies fever, malaise, fatigue, or abrupt weight changes.  HEENT: Denies eye pain, eye redness, ear pain, ringing in the ears, wax buildup, runny nose, nasal congestion, bloody nose, or sore throat. Respiratory: Denies difficulty breathing, shortness of breath, cough or sputum production.   Cardiovascular: Denies chest pain,  chest tightness, palpitations or swelling in the hands or feet.  Gastrointestinal: Denies abdominal pain, bloating, constipation, diarrhea or blood in the stool.  GU: Denies urgency, frequency, pain with urination, burning sensation, blood in urine, odor or discharge. Musculoskeletal: Denies decrease in range of motion, difficulty with gait, muscle pain or joint pain and swelling.  Skin: Denies redness, rashes, lesions or ulcercations.  Neurological: Denies dizziness, difficulty with memory, difficulty with speech or problems with balance and coordination.  Psych: Patient has a history of anxiety.  Denies depression, SI/HI.  No  other specific complaints in a complete review of systems (except as listed in HPI above).  Objective:   Physical Exam   BP 122/80   Pulse 79   Ht 5' 4.75" (1.645 m)   Wt 207 lb (93.9 kg)   SpO2 97%   BMI 34.71 kg/m   Wt Readings from Last 3 Encounters:  07/06/23 208 lb (94.3 kg)  03/24/23 216 lb 4.8 oz (98.1 kg)  03/18/23 214 lb (97.1 kg)    General: Appears her stated age, obese, in NAD. Skin: Warm, dry and intact.  HEENT: Head: normal shape and size; Eyes: sclera white, no icterus, conjunctiva pink, PERRLA and EOMs intact;  Neck:  Neck supple, trachea midline. No masses, lumps or thyromegaly present.  Cardiovascular: Normal rate and rhythm. S1,S2 noted.  No murmur, rubs or gallops noted. No JVD or BLE edema. No carotid bruits noted. Pulmonary/Chest: Normal effort and positive vesicular breath sounds. No respiratory distress. No wheezes, rales or ronchi noted.  Abdomen: Soft and nontender. Normal bowel sounds.  Musculoskeletal: Strength 5/5 BUE/BLE. No difficulty with gait.  Neurological: Alert and oriented. Cranial nerves II-XII grossly intact. Coordination normal.  Psychiatric: Mood and affect normal. Behavior is normal. Judgment and thought content normal.   BMET    Component Value Date/Time   NA 140 03/24/2023 1003   NA 143 01/26/2020 1600   K  3.1 (L) 03/24/2023 1003   CL 104 03/24/2023 1003   CO2 27 03/24/2023 1003   GLUCOSE 135 (H) 03/24/2023 1003   BUN 12 03/24/2023 1003   BUN 11 01/26/2020 1600   CREATININE 0.96 03/24/2023 1003   CREATININE 0.86 03/18/2023 1512   CALCIUM 9.2 03/24/2023 1003   GFRNONAA >60 03/24/2023 1003   GFRAA >60 02/25/2020 2231    Lipid Panel     Component Value Date/Time   CHOL 220 (H) 01/14/2023 0816   TRIG 142 01/14/2023 0816   HDL 51 01/14/2023 0816   CHOLHDL 4.3 01/14/2023 0816   VLDL 18.8 12/21/2019 1204   LDLCALC 142 (H) 01/14/2023 0816    CBC    Component Value Date/Time   WBC 4.1 03/24/2023 1003   WBC 4.3 01/14/2023 0816   RBC 3.73 (L) 03/24/2023 1003   HGB 11.4 (L) 03/24/2023 1003   HGB 10.1 (L) 04/25/2020 1154   HCT 32.4 (L) 03/24/2023 1003   HCT 31.6 (L) 04/25/2020 1154   PLT 216 03/24/2023 1003   PLT 261 04/25/2020 1154   MCV 86.9 03/24/2023 1003   MCV 80 04/25/2020 1154   MCH 30.6 03/24/2023 1003   MCHC 35.2 03/24/2023 1003   RDW 13.6 03/24/2023 1003   RDW 16.8 (H) 04/25/2020 1154   LYMPHSABS 1.0 03/24/2023 1003   LYMPHSABS 1.4 04/25/2020 1154   MONOABS 0.3 03/24/2023 1003   EOSABS 0.1 03/24/2023 1003   EOSABS 0.0 04/25/2020 1154   BASOSABS 0.1 03/24/2023 1003   BASOSABS 0.1 04/25/2020 1154    Hgb A1C Lab Results  Component Value Date   HGBA1C 5.7 (H) 01/14/2023           Assessment & Plan:   Preventative health maintenance:  She declines flu shot Tetanus UTD Encouraged her to get her COVID booster Discussed Shingrix vaccine, she will check coverage with her insurance company and schedule visit if she would like to have this done Pap smear UTD, will request copy Mammogram UTD Colon screening UTD Encouraged her to consume a balanced diet and exercise regimen Advised her to see an eye doctor and dentist  annually We will check CBC, c-Met, lipid, TSH, free T4, A1c today  RTC in 6 months, follow-up chronic conditions Nicki Reaper, NP

## 2023-07-13 NOTE — Assessment & Plan Note (Signed)
Encouraged diet and exercise for weight loss ?

## 2023-07-14 ENCOUNTER — Encounter: Payer: Self-pay | Admitting: Hematology

## 2023-07-14 ENCOUNTER — Encounter: Payer: Self-pay | Admitting: Internal Medicine

## 2023-07-14 ENCOUNTER — Inpatient Hospital Stay: Payer: BC Managed Care – PPO

## 2023-07-14 DIAGNOSIS — E7800 Pure hypercholesterolemia, unspecified: Secondary | ICD-10-CM

## 2023-07-14 LAB — LIPID PANEL
Cholesterol: 237 mg/dL — ABNORMAL HIGH (ref <200)
HDL: 46 mg/dL — ABNORMAL LOW (ref >=50)
LDL Cholesterol (Calc): 153 mg/dL — ABNORMAL HIGH
Non-HDL Cholesterol (Calc): 191 mg/dL — ABNORMAL HIGH (ref <130)
Total CHOL/HDL Ratio: 5.2 (calc) — ABNORMAL HIGH (ref <5.0)
Triglycerides: 211 mg/dL — ABNORMAL HIGH (ref <150)

## 2023-07-14 LAB — COMPLETE METABOLIC PANEL WITH GFR
AG Ratio: 1.5 (calc) (ref 1.0–2.5)
ALT: 8 U/L (ref 6–29)
AST: 13 U/L (ref 10–35)
Albumin: 4 g/dL (ref 3.6–5.1)
Alkaline phosphatase (APISO): 107 U/L (ref 37–153)
BUN: 10 mg/dL (ref 7–25)
CO2: 28 mmol/L (ref 20–32)
Calcium: 9 mg/dL (ref 8.6–10.4)
Chloride: 103 mmol/L (ref 98–110)
Creat: 0.87 mg/dL (ref 0.50–1.03)
Globulin: 2.6 g/dL (ref 1.9–3.7)
Glucose, Bld: 110 mg/dL (ref 65–139)
Potassium: 3.8 mmol/L (ref 3.5–5.3)
Sodium: 140 mmol/L (ref 135–146)
Total Bilirubin: 0.3 mg/dL (ref 0.2–1.2)
Total Protein: 6.6 g/dL (ref 6.1–8.1)
eGFR: 81 mL/min/{1.73_m2} (ref >=60)

## 2023-07-14 LAB — CBC
HCT: 34 % — ABNORMAL LOW (ref 35.0–45.0)
Hemoglobin: 11.2 g/dL — ABNORMAL LOW (ref 11.7–15.5)
MCH: 30 pg (ref 27.0–33.0)
MCHC: 32.9 g/dL (ref 32.0–36.0)
MCV: 91.2 fL (ref 80.0–100.0)
MPV: 9.6 fL (ref 7.5–12.5)
Platelets: 209 10*3/uL (ref 140–400)
RBC: 3.73 10*6/uL — ABNORMAL LOW (ref 3.80–5.10)
RDW: 13.7 % (ref 11.0–15.0)
WBC: 5.9 10*3/uL (ref 3.8–10.8)

## 2023-07-14 LAB — TSH: TSH: 0.71 m[IU]/L

## 2023-07-14 LAB — HEMOGLOBIN A1C
Hgb A1c MFr Bld: 5.4 %{Hb} (ref <5.7)
Mean Plasma Glucose: 108 mg/dL
eAG (mmol/L): 6 mmol/L

## 2023-07-14 LAB — T4, FREE: Free T4: 0.8 ng/dL (ref 0.8–1.8)

## 2023-07-16 ENCOUNTER — Other Ambulatory Visit: Payer: Self-pay | Admitting: Internal Medicine

## 2023-07-20 ENCOUNTER — Encounter: Payer: Self-pay | Admitting: Internal Medicine

## 2023-07-21 ENCOUNTER — Other Ambulatory Visit: Payer: Self-pay | Admitting: Nurse Practitioner

## 2023-07-24 ENCOUNTER — Other Ambulatory Visit: Payer: Self-pay | Admitting: Internal Medicine

## 2023-07-26 NOTE — Telephone Encounter (Signed)
Requested Prescriptions  Pending Prescriptions Disp Refills   levocetirizine (XYZAL) 5 MG tablet [Pharmacy Med Name: LEVOCETIRIZINE 5 MG TABLET] 90 tablet 3    Sig: TAKE 1 TABLET BY MOUTH EVERY DAY IN THE EVENING     Ear, Nose, and Throat:  Antihistamines - levocetirizine dihydrochloride Passed - 07/24/2023  9:07 AM      Passed - Cr in normal range and within 360 days    Creat  Date Value Ref Range Status  07/13/2023 0.87 0.50 - 1.03 mg/dL Final         Passed - eGFR is 10 or above and within 360 days    GFR calc Af Amer  Date Value Ref Range Status  02/25/2020 >60 >60 mL/min Final   GFR, Estimated  Date Value Ref Range Status  03/24/2023 >60 >60 mL/min Final    Comment:    (NOTE) Calculated using the CKD-EPI Creatinine Equation (2021)    GFR  Date Value Ref Range Status  12/21/2019 58.60 (L) >60.00 mL/min Final   eGFR  Date Value Ref Range Status  07/13/2023 81 > OR = 60 mL/min/1.75m2 Final         Passed - Valid encounter within last 12 months    Recent Outpatient Visits           1 week ago Encounter for general adult medical examination with abnormal findings   [place] [facility] [place], Salvadore Oxford, NP   2 weeks ago Episodic migraine   [place] [facility] [place], Salvadore Oxford, NP   4 months ago Bilateral hand pain   [place] [facility] [place], Salvadore Oxford, NP   6 months ago Acute neck pain   [place] [facility] [place], Salvadore Oxford, NP   6 months ago Acquired hypothyroidism   [place] [facility] [place], Salvadore Oxford, NP       Future Appointments             In 5 months Baity, Salvadore Oxford, NP  [facility], [facility]

## 2023-08-11 ENCOUNTER — Inpatient Hospital Stay: Payer: BC Managed Care – PPO

## 2023-08-31 ENCOUNTER — Other Ambulatory Visit: Payer: Self-pay | Admitting: Internal Medicine

## 2023-08-31 NOTE — Telephone Encounter (Signed)
Requested Prescriptions  Pending Prescriptions Disp Refills   valACYclovir (VALTREX) 500 MG tablet [Pharmacy Med Name: VALACYCLOVIR HCL 500 MG TABLET] 90 tablet 1    Sig: TAKE 1 TABLET (500 MG TOTAL) BY MOUTH DAILY AS NEEDED (HERPES SIMPLEX).     Antimicrobials:  Antiviral Agents - Anti-Herpetic Passed - 08/31/2023  3:06 PM      Passed - Valid encounter within last 12 months    Recent Outpatient Visits           1 month ago Encounter for general adult medical examination with abnormal findings   [place] [facility] [place], Salvadore Oxford, NP   1 month ago Episodic migraine   [place] [facility] [place], Salvadore Oxford, NP   5 months ago Bilateral hand pain   [place] [facility] [place], Salvadore Oxford, NP   7 months ago Acute neck pain   [place] [facility] [place], Salvadore Oxford, NP   7 months ago Acquired hypothyroidism   [place] [facility], Salvadore Oxford, NP       Future Appointments             In 4 months Baity, Salvadore Oxford, NP [facility], [facility]

## 2023-09-06 ENCOUNTER — Encounter: Payer: Self-pay | Admitting: Hematology

## 2023-09-07 ENCOUNTER — Other Ambulatory Visit: Payer: Self-pay

## 2023-09-07 DIAGNOSIS — D509 Iron deficiency anemia, unspecified: Secondary | ICD-10-CM

## 2023-09-07 DIAGNOSIS — D519 Vitamin B12 deficiency anemia, unspecified: Secondary | ICD-10-CM

## 2023-09-08 NOTE — Progress Notes (Signed)
 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 Biopsy of the left breast mass on 10/31/20 was again benign but felt to be discordant. -left lumpectomy under Dr. Vanderbilt on 12/18/20 showed invasive lobular carcinoma, 1.1 cm, grade 2, negative resection margins. ER+ (weakly), PR+, Her2-. RS 16 with less than 1% benefit of chemotherapy.  -Her 01/07/21 Sentinel LN biopsy was negative.  -Her genetic testing from 01/16/21 was negative for pathogenetic mutations.  -S/p adjuvant radiation with Dr. Dewey on 02/05/21 - 03/19/21.  -Tamoxifen  prescribed in April 2023 but did not start; [facility] 10/05/22 confirmed menopause and she switched to Anastrozole . However, due to joint pain and hot flashes, she only took it for 3 weeks and did not notify us  -Ms. Starkes is clinically doing well.  Exam is benign, recent mammogram is negative.  Overall no clinical concern for recurrence -Labs reviewed, mild anemia and hypokalemia.  I recommend to restart potassium supplement once daily.  Iron  panel is pending.  She will receive B12 injection today and continue monthly, except will skip August due to a 3-week trip. -We had a lengthy discussion about antiestrogen therapy.  Her mother took tamoxifen  and was very sick from it.  Patient did not tolerate  anastrozole  due to joint pain.  I recommend to try exemestane , reviewed potential risk, side effect, and benefit.  She is willing to try.  Took exemestane  for just a few days.  Stopped after developing fatigue and joint pain.  Does not want to try alternative at this time.  Understands that AI's to help with breast cancer prevention in the future. -Continue breast cancer surveillance. -Continue B12 injections at home.   Plan: Labs reviewed  -CBC showing WBC 4.1; Hgb 12.3; Hct 35.0; Plt 205; Anc 2.5 -CMP - K 3.5; glucose 98; BUN 11; Creatinine 0.99; eGFR > 60; Ca 9.2; LFTs normal.   -Ferritin-25; iron  34; TIBC 412; sat ratio 13; UIBC 358 Will check FSH, LH, and estradiol  Continue breast cancer surveillance. 3D screening mammogram should be scheduled for April 2025. Labs and follow-up in 6 months.  The patient understands the plans discussed today and is in agreement with them.  She knows to contact our office if she develops concerns prior to her next appointment.  I provided 25 minutes of face-to-face time during this encounter and > 50% was spent counseling as documented under my assessment and plan.    Powell FORBES Lessen, NP  [facility] - A DEPT OF [facility] [address] [place] [address] Dept: [phone] Dept Fax: [phone]   Orders Placed This Encounter  Procedures   Estradiol , Sensitive    Standing Status:   Future    Number of Occurrences:   1    Expected Date:   09/09/2023    Expiration Date:   09/08/2024  FSH-Follicle stimulating hormone    Standing Status:   Future    Number of Occurrences:   1    Expected Date:   09/09/2023    Expiration Date:   09/08/2024   LH-Luteinizing hormone    Standing Status:   Future    Number of Occurrences:   1    Expected Date:   09/09/2023    Expiration Date:   09/08/2024      CHIEF COMPLAINT:  CC: Follow-up of left breast cancer  Current Treatment: Anastrozole   started 09/2022.  Only took for 3 weeks.  Trial exemestane  starting 03/24/2023.  INTERVAL HISTORY:  Kierstan is here today for repeat clinical assessment.  She last saw Lacie, NP on 03/24/2023.  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  She agreed to a trial of exemestane  starting 03/24/2023.  She did have MRI of bilateral breast on 06/25/2023.  The MRI showed stable lumpectomy changes in the left breast.  No MRI evidence of malignancy in either breast.  The recommendation is for annual mammography starting April 2025.  Screening breast MRI should also be completed in October 2025.  Will check reproductive hormones including FSH, LH, and estradiol .  States she took exemestane  for this a few days.  Developed side effects which include fatigue and joint pain, and has stopped.  She understands taking AI is for prevention.  Will continue close breast cancer surveillance.  She denies chest pain, chest pressure, or shortness of breath. She denies headaches or visual disturbances. She denies abdominal pain, nausea, vomiting, or changes in bowel or bladder habits.   She denies fevers or chills. She denies pain. Her appetite is good. Her weight has increased 12 pounds over last 2 months .  I have reviewed the past medical history, past surgical history, social history and family history with the patient and they are unchanged from previous note.  ALLERGIES:  is allergic to ciprofloxacin, keflex [cephalexin], and trazodone  and nefazodone.  MEDICATIONS:  Current Outpatient Medications  Medication Sig Dispense Refill   ALPRAZolam  (XANAX ) 0.25 MG tablet Take 1 tablet (0.25 mg total) by mouth 2 (two) times daily as needed. 20 tablet 0   atorvastatin  (LIPITOR) 10 MG tablet TAKE 1 TABLET BY MOUTH EVERY DAY 90 tablet 1   butalbital -acetaminophen -caffeine  (FIORICET) 50-325-40 MG tablet Take 1 tablet by  mouth every 6 (six) hours as needed for headache. 20 tablet 5   cyanocobalamin  (VITAMIN B12) 1000 MCG/ML injection Inject 1 mL (1,000 mcg total) into the muscle every 30 (thirty) days. 30 mL 6   cyclobenzaprine  (FLEXERIL ) 10 MG tablet TAKE 1 TABLET (10 MG TOTAL) BY MOUTH DAILY AS NEEDED (MIGRAINES). 15 tablet 5   exemestane  (AROMASIN ) 25 MG tablet Take 1 tablet (25 mg total) by mouth daily after breakfast. 30 tablet 6   Fezolinetant  (VEOZAH ) 45 MG TABS Take 1 tablet (45 mg total) by mouth daily. 30 tablet 1   flecainide  (TAMBOCOR ) 50 MG tablet TAKE 1 TABLET BY MOUTH TWICE A DAY 180 tablet 3   fluticasone  (FLONASE ) 50 MCG/ACT nasal spray SPRAY 2 SPRAYS INTO EACH NOSTRIL EVERY DAY (Patient taking differently: Place 2 sprays into both nostrils daily as needed.) 48 mL 0   gabapentin  (NEURONTIN ) 300 MG capsule TAKE 1 CAPSULE BY MOUTH TWICE A DAY 60 capsule 1   KLOR-CON  M10 10 MEQ tablet TAKE 1 TABLET BY MOUTH TWICE A DAY  180 tablet 1   levocetirizine (XYZAL ) 5 MG tablet TAKE 1 TABLET BY MOUTH EVERY DAY IN THE EVENING 90 tablet 3   levothyroxine  (SYNTHROID ) 88 MCG tablet TAKE 1 TABLET BY MOUTH EVERY DAY 90 tablet 0   losartan  (COZAAR ) 50 MG tablet TAKE 1 TABLET BY MOUTH EVERY DAY 90 tablet 1   NURTEC 75 MG TBDP Take 1 tablet (75 mg total) by mouth daily as needed (migraine headache). Max 1 tablet in 24 hours. 8 tablet 2   omeprazole  (PRILOSEC) 20 MG capsule TAKE 1 CAPSULE BY MOUTH EVERY DAY 90 capsule 1   valACYclovir  (VALTREX ) 500 MG tablet TAKE 1 TABLET (500 MG TOTAL) BY MOUTH DAILY AS NEEDED (HERPES SIMPLEX). 90 tablet 1   venlafaxine  XR (EFFEXOR -XR) 75 MG 24 hr capsule TAKE 1 CAPSULE BY MOUTH DAILY WITH BREAKFAST. 90 capsule 0   No current facility-administered medications for this visit.   Facility-Administered Medications Ordered in Other Visits  Medication Dose Route Frequency Provider Last Rate Last Admin   cyanocobalamin  (VITAMIN B12) injection 1,000 mcg  1,000 mcg Intramuscular Once Lanny Callander, MD        HISTORY OF PRESENT ILLNESS:   Oncology History Overview Note  Cancer Staging Carcinoma of upper-outer quadrant of left breast, estrogen receptor positive (HCC) Staging form: Breast, AJCC 8th Edition - Pathologic stage from 01/02/2021: No Stage Recommended (pT1c, cN0, cM0, G2, ER+, PR+, HER2-) - Unsigned    History of left breast cancer  10/28/2020 Imaging   DIGITAL DIAGNOSTIC UNILATERAL LEFT MAMMOGRAM WITH TOMOSYNTHESIS AND CAD; ULTRASOUND LEFT BREAST LIMITED  CLINICAL DATA: 53 year old female presenting for short-term follow-up of a left breast biopsy demonstrating fibrocystic changes.  IMPRESSION: Indeterminate left breast mass at 2 o'clock 7 cm from the nipple. Targeted ultrasound of the left axilla demonstrates normal lymph nodes.   10/31/2020 Pathology Results   Breast, left, needle core biopsy, 2 o'clock - FIBROADIPOSE TISSUE WITH MINIMAL BREAST PARENCHYMA DEMONSTRATING MILD FIBROCYSTIC CHANGE TO INCLUDE APOCRINE METAPLASIA.  This was found to be discordant.   12/18/2020 Surgery   A. BREAST, LEFT, LUMPECTOMY:  - Invasive lobular carcinoma, 1.1 cm, grade 2.  See comment  - Resection margins are negative for carcinoma; inferior margin is  focally less than 1 mm from carcinoma and medial margin is approximately  1 mm from carcinoma  - Biopsy related changes  - Background fibrocystic change   PROGNOSTIC INDICATOR RESULTS:  - The tumor cells are NEGATIVE for Her2 (0).  - Estrogen Receptor:       POSITIVE, 30%, WEAK STAINING  - Progesterone Receptor:   POSITIVE, 80%, MODERATE STAINING  - Proliferation Marker Ki-67:   <5%    12/18/2020 Oncotype testing   Oncotype  Recurrence Score 16  Distant recurrence risk at 9 years of 4% with AI or Tamoxifen  alone.  Less than 1% benefit of chemotherapy.    01/02/2021 Initial Diagnosis   Carcinoma of upper-outer quadrant of left breast, estrogen receptor positive (HCC)   01/02/2021 Cancer Staging   Staging form:  Breast, AJCC 8th Edition - Pathologic stage from 01/02/2021: Stage IA (pT1c, pN0, cM0, G2, ER+, PR+, HER2-) - Signed by Burton, Lacie K, NP on 05/12/2021 Method of lymph node assessment: Clinical Multigene prognostic tests performed: None Histologic grading system: 3 grade system Residual tumor (R): R0 - None   01/07/2021 Surgery   LEFT SENTINEL NODE BIOPSY by Dr Vanderbilt  FINAL MICROSCOPIC DIAGNOSIS:   A. LYMPH NODE, LEFT, SENTINEL, EXCISION:  - One lymph node negative for  metastatic carcinoma (0/1).   B. LYMPH NODE, LEFT, SENTINEL, EXCISION:  - One lymph node negative for metastatic carcinoma (0/1).   COMMENT:  Immunohistochemistry for cytokeratin AE1/AE3 is performed on parts A and  B and no metastatic carcinoma is identified   02/05/2021 - 03/19/2021 Radiation Therapy   Adjuvant Radiation with Dr Dewey    05/13/2021 -  Anti-estrogen oral therapy   Tamoxifen  20mg  once daily starting in September 2022   05/13/2021 Survivorship   SCP delivered by Lacie Burton, NP   06/25/2023 Imaging   MR bilateral breasts with and without contrast  IMPRESSION: Stable lumpectomy changes in the left breast. No MRI evidence of malignancy in either breast.  RECOMMENDATION: 1.  Recommend annual mammography, due April 2025.  2. The American Cancer Society recommends annual MRI and mammography in patients with an estimated lifetime risk of developing breast cancer greater than 20 - 25%, or who are known or suspected to be positive for the breast cancer gene.  BI-RADS CATEGORY  2: Benign.         REVIEW OF SYSTEMS:   Constitutional: Denies fevers, chills or abnormal weight loss Eyes: Denies blurriness of vision Ears, nose, mouth, throat, and face: Denies mucositis or sore throat Respiratory: Denies cough, dyspnea or wheezes Cardiovascular: Denies palpitation, chest discomfort or lower extremity swelling Gastrointestinal:  Denies nausea, heartburn or change in bowel habits Skin: Denies abnormal skin  rashes Lymphatics: Denies new lymphadenopathy or easy bruising Neurological:Denies numbness, tingling or new weaknesses Behavioral/Psych: Mood is stable, no new changes  All other systems were reviewed with the patient and are negative.   VITALS:   Today's Vitals   09/09/23 1154 09/09/23 1155  BP: 139/86   Pulse: 77   Resp: 18   Temp: 98 F (36.7 C)   TempSrc: Temporal   SpO2: 100%   Weight: 219 lb 1.6 oz (99.4 kg)   Height: 5' 5 (1.651 m)   PainSc:  0-No pain   Body mass index is 36.46 kg/m.   Wt Readings from Last 3 Encounters:  09/09/23 219 lb 1.6 oz (99.4 kg)  07/13/23 207 lb (93.9 kg)  07/06/23 208 lb (94.3 kg)    Body mass index is 36.46 kg/m.  Performance status (ECOG): 1 - Symptomatic but completely ambulatory  PHYSICAL EXAM:   GENERAL:alert, no distress and comfortable SKIN: skin color, texture, turgor are normal, no rashes or significant lesions EYES: normal, Conjunctiva are pink and non-injected, sclera clear OROPHARYNX:no exudate, no erythema and lips, buccal mucosa, and tongue normal  NECK: supple, thyroid  normal size, non-tender, without nodularity LYMPH:  no palpable lymphadenopathy in the cervical, axillary or inguinal LUNGS: clear to auscultation and percussion with normal breathing effort HEART: regular rate & rhythm and no murmurs and no lower extremity edema ABDOMEN:abdomen soft, non-tender and normal bowel sounds Musculoskeletal:no cyanosis of digits and no clubbing  NEURO: alert & oriented x 3 with fluent speech, no focal motor/sensory deficits BREAST EXAM:  No nipple discharge or inversion.  S/p left lumpectomy, incisions completely healed, more scar tissue at the breast incision. Slightly tender to palpate area along breast lumpectomy scar. No palpable mass in either breast today.  No axillary lymphadenopathy noted bilaterally.  LABORATORY DATA:  I have reviewed the data as listed    Component Value Date/Time   NA 140 09/09/2023 1118    NA 143 01/26/2020 1600   K 3.5 09/09/2023 1118   CL 104 09/09/2023 1118   CO2 30 09/09/2023 1118   GLUCOSE 98  09/09/2023 1118   BUN 11 09/09/2023 1118   BUN 11 01/26/2020 1600   CREATININE 0.99 09/09/2023 1118   CREATININE 0.87 07/13/2023 1355   CALCIUM  9.2 09/09/2023 1118   PROT 7.5 09/09/2023 1118   ALBUMIN 4.2 09/09/2023 1118   AST 14 (L) 09/09/2023 1118   ALT 9 09/09/2023 1118   ALKPHOS 111 09/09/2023 1118   BILITOT 0.3 09/09/2023 1118   GFRNONAA >60 09/09/2023 1118   GFRAA >60 02/25/2020 2231    Lab Results  Component Value Date   WBC 4.1 09/09/2023   NEUTROABS 2.5 09/09/2023   HGB 12.3 09/09/2023   HCT 35.0 (L) 09/09/2023   MCV 88.4 09/09/2023   PLT 205 09/09/2023   Iron /TIBC/Ferritin/ %Sat    Component Value Date/Time   IRON  54 09/09/2023 1118   IRON  32 04/25/2020 1154   TIBC 412 09/09/2023 1118   TIBC 514 (H) 04/25/2020 1154   FERRITIN 25 09/09/2023 1118   FERRITIN 5 (L) 04/25/2020 1154   IRONPCTSAT 13 09/09/2023 1118   IRONPCTSAT 21 01/14/2023 0816

## 2023-09-08 NOTE — Assessment & Plan Note (Addendum)
 estrogen receptor positive (HCC) Stage IA, (pT1c, pN0), ER+/PR+/HER2-, Grade 2, RS 16  -She was under short-term follow up after a benign left breast biopsy. Biopsy of the left breast mass on 10/31/20 was again benign but felt to be discordant. -left lumpectomy under Dr. Vanderbilt on 12/18/20 showed invasive lobular carcinoma, 1.1 cm, grade 2, negative resection margins. ER+ (weakly), PR+, Her2-. RS 16 with less than 1% benefit of chemotherapy.  -Her 01/07/21 Sentinel LN biopsy was negative.  -Her genetic testing from 01/16/21 was negative for pathogenetic mutations.  -S/p adjuvant radiation with Dr. Dewey on 02/05/21 - 03/19/21.  -Tamoxifen  prescribed in April 2023 but did not start; [facility] 10/05/22 confirmed menopause and she switched to Anastrozole . However, due to joint pain and hot flashes, she only took it for 3 weeks and did not notify us  -Ms. Gauntt is clinically doing well.  Exam is benign, recent mammogram is negative.  Overall no clinical concern for recurrence -Labs reviewed, mild anemia and hypokalemia.  I recommend to restart potassium supplement once daily.  Iron  panel is pending.  She will receive B12 injection today and continue monthly, except will skip August due to a 3-week trip. -We had a lengthy discussion about antiestrogen therapy.  Her mother took tamoxifen  and was very sick from it.  Patient did not tolerate anastrozole  due to joint pain.  I recommend to try exemestane , reviewed potential risk, side effect, and benefit.  She is willing to try.  Took exemestane  for just a few days.  Stopped after developing fatigue and joint pain.  Does not want to try alternative at this time.  Understands that AI's to help with breast cancer prevention in the future. -Continue breast cancer surveillance. -Continue B12 injections at home.

## 2023-09-09 ENCOUNTER — Inpatient Hospital Stay: Payer: 59

## 2023-09-09 ENCOUNTER — Inpatient Hospital Stay: Payer: Self-pay

## 2023-09-09 ENCOUNTER — Encounter: Payer: Self-pay | Admitting: Hematology

## 2023-09-09 ENCOUNTER — Inpatient Hospital Stay: Payer: 59 | Attending: Hematology | Admitting: Nurse Practitioner

## 2023-09-09 VITALS — BP 139/86 | HR 77 | Temp 98.0°F | Resp 18 | Ht 65.0 in | Wt 219.1 lb

## 2023-09-09 DIAGNOSIS — Z1721 Progesterone receptor positive status: Secondary | ICD-10-CM | POA: Insufficient documentation

## 2023-09-09 DIAGNOSIS — C50412 Malignant neoplasm of upper-outer quadrant of left female breast: Secondary | ICD-10-CM | POA: Insufficient documentation

## 2023-09-09 DIAGNOSIS — Z79899 Other long term (current) drug therapy: Secondary | ICD-10-CM | POA: Insufficient documentation

## 2023-09-09 DIAGNOSIS — Z923 Personal history of irradiation: Secondary | ICD-10-CM | POA: Insufficient documentation

## 2023-09-09 DIAGNOSIS — E876 Hypokalemia: Secondary | ICD-10-CM | POA: Insufficient documentation

## 2023-09-09 DIAGNOSIS — D519 Vitamin B12 deficiency anemia, unspecified: Secondary | ICD-10-CM

## 2023-09-09 DIAGNOSIS — Z853 Personal history of malignant neoplasm of breast: Secondary | ICD-10-CM

## 2023-09-09 DIAGNOSIS — D509 Iron deficiency anemia, unspecified: Secondary | ICD-10-CM

## 2023-09-09 DIAGNOSIS — Z170 Estrogen receptor positive status [ER+]: Secondary | ICD-10-CM | POA: Diagnosis not present

## 2023-09-09 DIAGNOSIS — D649 Anemia, unspecified: Secondary | ICD-10-CM | POA: Diagnosis not present

## 2023-09-09 DIAGNOSIS — Z1732 Human epidermal growth factor receptor 2 negative status: Secondary | ICD-10-CM | POA: Insufficient documentation

## 2023-09-09 LAB — CMP (CANCER CENTER ONLY)
ALT: 9 U/L (ref 0–44)
AST: 14 U/L — ABNORMAL LOW (ref 15–41)
Albumin: 4.2 g/dL (ref 3.5–5.0)
Alkaline Phosphatase: 111 U/L (ref 38–126)
Anion gap: 6 (ref 5–15)
BUN: 11 mg/dL (ref 6–20)
CO2: 30 mmol/L (ref 22–32)
Calcium: 9.2 mg/dL (ref 8.9–10.3)
Chloride: 104 mmol/L (ref 98–111)
Creatinine: 0.99 mg/dL (ref 0.44–1.00)
GFR, Estimated: >60 mL/min (ref >60)
Glucose, Bld: 98 mg/dL (ref 70–99)
Potassium: 3.5 mmol/L (ref 3.5–5.1)
Sodium: 140 mmol/L (ref 135–145)
Total Bilirubin: 0.3 mg/dL (ref 0.0–1.2)
Total Protein: 7.5 g/dL (ref 6.5–8.1)

## 2023-09-09 LAB — CBC WITH DIFFERENTIAL (CANCER CENTER ONLY)
Abs Immature Granulocytes: 0.01 10*3/uL (ref 0.00–0.07)
Basophils Absolute: 0.1 10*3/uL (ref 0.0–0.1)
Basophils Relative: 2 %
Eosinophils Absolute: 0.1 10*3/uL (ref 0.0–0.5)
Eosinophils Relative: 3 %
HCT: 35 % — ABNORMAL LOW (ref 36.0–46.0)
Hemoglobin: 12.3 g/dL (ref 12.0–15.0)
Immature Granulocytes: 0 %
Lymphocytes Relative: 30 %
Lymphs Abs: 1.2 10*3/uL (ref 0.7–4.0)
MCH: 31.1 pg (ref 26.0–34.0)
MCHC: 35.1 g/dL (ref 30.0–36.0)
MCV: 88.4 fL (ref 80.0–100.0)
Monocytes Absolute: 0.2 10*3/uL (ref 0.1–1.0)
Monocytes Relative: 6 %
Neutro Abs: 2.5 10*3/uL (ref 1.7–7.7)
Neutrophils Relative %: 59 %
Platelet Count: 205 10*3/uL (ref 150–400)
RBC: 3.96 MIL/uL (ref 3.87–5.11)
RDW: 13.6 % (ref 11.5–15.5)
WBC Count: 4.1 10*3/uL (ref 4.0–10.5)
nRBC: 0 % (ref 0.0–0.2)

## 2023-09-09 LAB — IRON AND IRON BINDING CAPACITY (CC-WL,HP ONLY)
Iron: 54 ug/dL (ref 28–170)
Saturation Ratios: 13 % (ref 10.4–31.8)
TIBC: 412 ug/dL (ref 250–450)
UIBC: 358 ug/dL (ref 148–442)

## 2023-09-09 LAB — FERRITIN: Ferritin: 25 ng/mL (ref 11–307)

## 2023-09-09 MED ORDER — CYANOCOBALAMIN 1000 MCG/ML IJ SOLN
1000.0000 ug | Freq: Once | INTRAMUSCULAR | Status: AC
  Filled 2023-09-09: qty 1

## 2023-09-10 ENCOUNTER — Other Ambulatory Visit: Payer: Self-pay | Admitting: Hematology

## 2023-09-10 DIAGNOSIS — Z1231 Encounter for screening mammogram for malignant neoplasm of breast: Secondary | ICD-10-CM

## 2023-09-10 LAB — FOLLICLE STIMULATING HORMONE: FSH: 153 m[IU]/mL

## 2023-09-10 LAB — LUTEINIZING HORMONE: LH: 99.7 m[IU]/mL

## 2023-09-12 ENCOUNTER — Encounter: Payer: Self-pay | Admitting: Nurse Practitioner

## 2023-09-12 ENCOUNTER — Encounter: Payer: Self-pay | Admitting: Hematology

## 2023-09-13 ENCOUNTER — Encounter: Payer: Self-pay | Admitting: Nurse Practitioner

## 2023-09-13 LAB — ESTRADIOL, ULTRA SENS: Estradiol, Sensitive: 2.8 pg/mL

## 2023-09-16 ENCOUNTER — Other Ambulatory Visit: Payer: Self-pay | Admitting: Nurse Practitioner

## 2023-09-16 DIAGNOSIS — C50412 Malignant neoplasm of upper-outer quadrant of left female breast: Secondary | ICD-10-CM

## 2023-09-16 DIAGNOSIS — E2839 Other primary ovarian failure: Secondary | ICD-10-CM

## 2023-09-16 DIAGNOSIS — R232 Flushing: Secondary | ICD-10-CM

## 2023-09-16 NOTE — Telephone Encounter (Signed)
 [name].  Would you reach out to he patient and schedule her for a lab appointment in about 6 weeks? The lab orders are in for her. Thank you so much. Herbert Seta, NP

## 2023-10-08 ENCOUNTER — Other Ambulatory Visit: Payer: Self-pay | Admitting: Internal Medicine

## 2023-10-08 DIAGNOSIS — Z0001 Encounter for general adult medical examination with abnormal findings: Secondary | ICD-10-CM

## 2023-10-11 NOTE — Telephone Encounter (Signed)
Requested medication (s) are due for refill today: Yes  Requested medication (s) are on the active medication list: Yes  Last refill:  06/25/22  Future visit scheduled:   Notes to clinic:  Not delegated.    Requested Prescriptions  Pending Prescriptions Disp Refills   butalbital-acetaminophen-caffeine (FIORICET) 50-325-40 MG tablet [Pharmacy Med Name: BUTALB-ACETAMIN-CAFF 50-325-40] 20 tablet 5    Sig: TAKE 1 TABLET BY MOUTH EVERY 6 HOURS AS NEEDED FOR HEADACHE.     Not Delegated - Analgesics:  Non-Opioid Analgesic Combinations 2 Failed - 10/11/2023  8:40 AM      Failed - This refill cannot be delegated      Passed - Cr in normal range and within 360 days    Creatinine  Date Value Ref Range Status  09/09/2023 0.99 0.44 - 1.00 mg/dL Final   Creat  Date Value Ref Range Status  07/13/2023 0.87 0.50 - 1.03 mg/dL Final         Passed - eGFR is 10 or above and within 360 days    GFR calc Af Amer  Date Value Ref Range Status  02/25/2020 >60 >60 mL/min Final   GFR, Estimated  Date Value Ref Range Status  09/09/2023 >60 >60 mL/min Final    Comment:    (NOTE) Calculated using the CKD-EPI Creatinine Equation (2021)    GFR  Date Value Ref Range Status  12/21/2019 58.60 (L) >60.00 mL/min Final   eGFR  Date Value Ref Range Status  07/13/2023 81 > OR = 60 mL/min/1.53m2 Final         Passed - Patient is not pregnant      Passed - Valid encounter within last 12 months    Recent Outpatient Visits           3 months ago Encounter for general adult medical examination with abnormal findings   [facility] [place], Salvadore Oxford, NP   3 months ago Episodic migraine   [place] [facility] [place], Salvadore Oxford, NP   6 months ago Bilateral hand pain   [place] [facility] [place], Salvadore Oxford, NP   8 months ago Acute neck pain   [place] [facility] [place], Salvadore Oxford, NP   9 months ago Acquired hypothyroidism    [place] [facility] [place], Salvadore Oxford, NP       Future Appointments             In 3 months Baity, Salvadore Oxford, NP [facility]             ALPRAZolam Prudy Feeler) 0.25 MG tablet [Pharmacy Med Name: ALPRAZOLAM 0.25 MG TABLET] 20 tablet 0    Sig: TAKE 1 TABLET BY MOUTH 2 TIMES DAILY AS NEEDED.     Not Delegated - Psychiatry: Anxiolytics/Hypnotics 2 Failed - 10/11/2023  8:40 AM      Failed - This refill cannot be delegated      Failed - Urine Drug Screen completed in last 360 days      Passed - Patient is not pregnant      Passed - Valid encounter within last 6 months    Recent Outpatient Visits           3 months ago Encounter for general adult medical examination with abnormal findings   [facility] [place], Salvadore Oxford, NP   3 months ago Episodic migraine  [place] [facility] [place], Salvadore Oxford, NP   6 months ago Bilateral hand pain   [facility] [address], Salvadore Oxford, NP   8 months ago Acute neck pain   [place] [facility] [place], Salvadore Oxford, NP   9 months ago Acquired hypothyroidism   [place] [facility] [place], Salvadore Oxford, NP       Future Appointments             In 3 months Baity, Salvadore Oxford, NP [facility]

## 2023-10-17 ENCOUNTER — Other Ambulatory Visit: Payer: Self-pay | Admitting: Internal Medicine

## 2023-10-18 ENCOUNTER — Encounter: Payer: Self-pay | Admitting: Hematology

## 2023-10-18 NOTE — Telephone Encounter (Signed)
 Requested Prescriptions  Pending Prescriptions Disp Refills   levothyroxine  (SYNTHROID ) 88 MCG tablet [Pharmacy Med Name: LEVOTHYROXINE  88 MCG TABLET] 90 tablet 1    Sig: TAKE 1 TABLET BY MOUTH EVERY DAY     Endocrinology:  Hypothyroid Agents Passed - 10/18/2023  3:40 PM      Passed - TSH in normal range and within 360 days    TSH  Date Value Ref Range Status  07/13/2023 0.71 mIU/L Final    Comment:              Reference Range .           > or = 20 Years  0.40-4.50 .                Pregnancy Ranges           First trimester    0.26-2.66           Second trimester   0.55-2.73           Third trimester    0.43-2.91          Passed - Valid encounter within last 12 months    Recent Outpatient Visits           3 months ago Encounter for general adult medical examination with abnormal findings   [place] [facility] [place], Rankin Buzzard, NP   3 months ago Episodic migraine   [place] [facility] [place], Rankin Buzzard, NP   7 months ago Bilateral hand pain   [place] [facility] [place], Rankin Buzzard, NP   9 months ago Acute neck pain   [place] [facility] [place], Rankin Buzzard, NP   9 months ago Acquired hypothyroidism   [place] [facility] [place], Rankin Buzzard, NP       Future Appointments             In 2 months Baity, Rankin Buzzard, NP [place] [facility]

## 2023-10-22 ENCOUNTER — Other Ambulatory Visit: Payer: Self-pay | Admitting: Internal Medicine

## 2023-10-25 ENCOUNTER — Inpatient Hospital Stay: Payer: 59 | Attending: Hematology

## 2023-11-03 ENCOUNTER — Ambulatory Visit: Payer: Self-pay | Admitting: Neurology

## 2023-11-10 ENCOUNTER — Ambulatory Visit: Payer: Self-pay | Admitting: Neurology

## 2023-11-13 ENCOUNTER — Encounter: Payer: Self-pay | Admitting: Internal Medicine

## 2023-11-16 ENCOUNTER — Telehealth (INDEPENDENT_AMBULATORY_CARE_PROVIDER_SITE_OTHER): Admitting: Internal Medicine

## 2023-11-16 DIAGNOSIS — R41840 Attention and concentration deficit: Secondary | ICD-10-CM

## 2023-11-16 NOTE — Progress Notes (Signed)
 Virtual Visit via Video Note  I connected with [name] on 11/16/23 at  3:00 PM EDT by a video enabled telemedicine application and verified that I am speaking with the correct person using two identifiers.  Location: Patient: Work Provider: Herbalist participating in this video call: Nicki Reaper, NP-C and Glenford Peers.   I discussed the limitations of evaluation and management by telemedicine and the availability of in person appointments. The patient expressed understanding and agreed to proceed.  History of Present Illness:   Discussed the use of AI scribe software for clinical note transcription with the patient, who gave verbal consent to proceed.  The patient presents with concerns about ADHD symptoms.  She experiences significant difficulties with attention and hyperactivity, which have been progressively worsening. She has days where she cannot function, is unable to sit still, focus, or articulate effectively at work. She paces in the classroom all day because she cannot stay in one spot or focus on one task. She has not had a formal diagnosis of ADHD but believes she has had symptoms for a long time. Her symptoms have become more pronounced, possibly due to hormonal changes associated with menopause. She experiences mood fluctuations, which are somewhat controlled by a low-dose antidepressant, but this does not alleviate her ADHD symptoms.   Past Medical History:  Diagnosis Date   Allergy    Anemia    present for many years, mild, stable   Anxiety    Breast cancer (HCC)    Breast cancer (HCC) 12/18/2020   Dysrhythmia    PAC's on flecanide (did not tolerate beta blockers)   Family history of breast cancer 01/20/2021   Family history of prostate cancer 01/20/2021   Frequent headaches    HSV infection    fever blisters   Hypertension    Hypothyroidism    Personal history of radiation therapy     Current Outpatient Medications  Medication Sig  Dispense Refill   ALPRAZolam (XANAX) 0.25 MG tablet TAKE 1 TABLET BY MOUTH 2 TIMES DAILY AS NEEDED. 20 tablet 0   atorvastatin (LIPITOR) 10 MG tablet TAKE 1 TABLET BY MOUTH EVERY DAY 90 tablet 1   butalbital-acetaminophen-caffeine (FIORICET) 50-325-40 MG tablet TAKE 1 TABLET BY MOUTH EVERY 6 HOURS AS NEEDED FOR HEADACHE. 20 tablet 5   cyanocobalamin (VITAMIN B12) 1000 MCG/ML injection Inject 1 mL (1,000 mcg total) into the muscle every 30 (thirty) days. 30 mL 6   cyclobenzaprine (FLEXERIL) 10 MG tablet TAKE 1 TABLET (10 MG TOTAL) BY MOUTH DAILY AS NEEDED (MIGRAINES). 15 tablet 5   exemestane (AROMASIN) 25 MG tablet Take 1 tablet (25 mg total) by mouth daily after breakfast. 30 tablet 6   Fezolinetant (VEOZAH) 45 MG TABS Take 1 tablet (45 mg total) by mouth daily. 30 tablet 1   flecainide (TAMBOCOR) 50 MG tablet TAKE 1 TABLET BY MOUTH TWICE A DAY 180 tablet 3   fluticasone (FLONASE) 50 MCG/ACT nasal spray SPRAY 2 SPRAYS INTO EACH NOSTRIL EVERY DAY (Patient taking differently: Place 2 sprays into both nostrils daily as needed.) 48 mL 0   gabapentin (NEURONTIN) 300 MG capsule TAKE 1 CAPSULE BY MOUTH TWICE A DAY 60 capsule 1   KLOR-CON M10 10 MEQ tablet TAKE 1 TABLET BY MOUTH TWICE A DAY 180 tablet 1   levocetirizine (XYZAL) 5 MG tablet TAKE 1 TABLET BY MOUTH EVERY DAY IN THE EVENING 90 tablet 3   levothyroxine (SYNTHROID) 88 MCG tablet TAKE 1 TABLET BY MOUTH EVERY DAY  90 tablet 1   losartan (COZAAR) 50 MG tablet TAKE 1 TABLET BY MOUTH EVERY DAY 90 tablet 1   NURTEC 75 MG TBDP Take 1 tablet (75 mg total) by mouth daily as needed (migraine headache). Max 1 tablet in 24 hours. 8 tablet 2   omeprazole (PRILOSEC) 20 MG capsule TAKE 1 CAPSULE BY MOUTH EVERY DAY 90 capsule 1   valACYclovir (VALTREX) 500 MG tablet TAKE 1 TABLET (500 MG TOTAL) BY MOUTH DAILY AS NEEDED (HERPES SIMPLEX). 90 tablet 1   venlafaxine XR (EFFEXOR-XR) 75 MG 24 hr capsule TAKE 1 CAPSULE BY MOUTH DAILY WITH BREAKFAST. 90 capsule 0    No current facility-administered medications for this visit.   Facility-Administered Medications Ordered in Other Visits  Medication Dose Route Frequency Provider Last Rate Last Admin   cyanocobalamin (VITAMIN B12) injection 1,000 mcg  1,000 mcg Intramuscular Once Malachy Mood, MD        Allergies  Allergen Reactions   Ciprofloxacin     anxiety   Keflex [Cephalexin] Hives   Trazodone And Nefazodone Other (See Comments)    nightmares    Family History  Problem Relation Age of Onset   Depression Mother    Breast cancer Mother 4   Hyperlipidemia Father    Prostate cancer Father 59    Social History   Socioeconomic History   Marital status: Single    Spouse name: Not on file   Number of children: 1   Years of education: Not on file   Highest education level: Bachelor's degree (e.g., BA, AB, BS)  Occupational History   Not on file  Tobacco Use   Smoking status: Never   Smokeless tobacco: Never  Vaping Use   Vaping status: Never Used  Substance and Sexual Activity   Alcohol use: Yes    Comment: occasional   Drug use: No   Sexual activity: Yes    Partners: Male    Birth control/protection: Pill  Other Topics Concern   Not on file  Social History Narrative   Not on file   Social Drivers of Health   Financial Resource Strain: Medium Risk (07/13/2023)   Overall Financial Resource Strain (CARDIA)    Difficulty of Paying Living Expenses: Somewhat hard  Food Insecurity: Food Insecurity Present (07/13/2023)   Hunger Vital Sign    Worried About Running Out of Food in the Last Year: Sometimes true    Ran Out of Food in the Last Year: Sometimes true  Transportation Needs: No Transportation Needs (07/13/2023)   PRAPARE - Administrator, Civil Service (Medical): No    Lack of Transportation (Non-Medical): No  Physical Activity: Insufficiently Active (07/13/2023)   Exercise Vital Sign    Days of Exercise per Week: 3 days    Minutes of Exercise per Session: 20  min  Stress: No Stress Concern Present (07/13/2023)   [employer] of Occupational Health - Occupational Stress Questionnaire    Feeling of Stress : Not at all  Social Connections: Unknown (07/13/2023)   Social Connection and Isolation Panel [NHANES]    Frequency of Communication with Friends and Family: Twice a week    Frequency of Social Gatherings with Friends and Family: Once a week    Attends Religious Services: Never    Database administrator or Organizations: Patient declined    Attends Banker Meetings: Not on file    Marital Status: Never married  Intimate Partner Violence: Not At Risk (01/02/2021)   Humiliation, Afraid,  Rape, and Kick questionnaire    Fear of Current or Ex-Partner: No    Emotionally Abused: No    Physically Abused: No    Sexually Abused: No     Constitutional: Patient reports intermittent headaches.  Denies fever, malaise, fatigue, or abrupt weight changes.  HEENT: Denies eye pain, eye redness, ear pain, ringing in the ears, wax buildup, runny nose, nasal congestion, bloody nose, or sore throat. Respiratory: Denies difficulty breathing, shortness of breath, cough or sputum production.   Cardiovascular: Denies chest pain, chest tightness, palpitations or swelling in the hands or feet.  Gastrointestinal: Denies abdominal pain, bloating, constipation, diarrhea or blood in the stool.  GU: Denies urgency, frequency, pain with urination, burning sensation, blood in urine, odor or discharge. Musculoskeletal: Denies decrease in range of motion, difficulty with gait, muscle pain or joint pain and swelling.  Skin: Denies redness, rashes, lesions or ulcercations.  Neurological: Patient reports inattention.  Denies dizziness, difficulty with memory, difficulty with speech or problems with balance and coordination.  Psych: Patient has a history of anxiety.  Denies depression, SI/HI.  No other specific complaints in a complete review of systems (except as  listed in HPI above).  Observations/Objective:   Wt Readings from Last 3 Encounters:  09/09/23 219 lb 1.6 oz (99.4 kg)  07/13/23 207 lb (93.9 kg)  07/06/23 208 lb (94.3 kg)    General: Appears her stated age, obese, in NAD. Pulmonary/Chest: Normal effort. No respiratory distress.  Neurological: Alert and oriented.  Psychiatric: Mood and affect normal. Behavior is normal. Judgment and thought content normal.   BMET    Component Value Date/Time   NA 140 09/09/2023 1118   NA 143 01/26/2020 1600   K 3.5 09/09/2023 1118   CL 104 09/09/2023 1118   CO2 30 09/09/2023 1118   GLUCOSE 98 09/09/2023 1118   BUN 11 09/09/2023 1118   BUN 11 01/26/2020 1600   CREATININE 0.99 09/09/2023 1118   CREATININE 0.87 07/13/2023 1355   CALCIUM 9.2 09/09/2023 1118   GFRNONAA >60 09/09/2023 1118   GFRAA >60 02/25/2020 2231    Lipid Panel     Component Value Date/Time   CHOL 237 (H) 07/13/2023 1355   TRIG 211 (H) 07/13/2023 1355   HDL 46 (L) 07/13/2023 1355   CHOLHDL 5.2 (H) 07/13/2023 1355   VLDL 18.8 12/21/2019 1204   LDLCALC 153 (H) 07/13/2023 1355    CBC    Component Value Date/Time   WBC 4.1 09/09/2023 1118   WBC 5.9 07/13/2023 1355   RBC 3.96 09/09/2023 1118   HGB 12.3 09/09/2023 1118   HGB 10.1 (L) 04/25/2020 1154   HCT 35.0 (L) 09/09/2023 1118   HCT 31.6 (L) 04/25/2020 1154   PLT 205 09/09/2023 1118   PLT 261 04/25/2020 1154   MCV 88.4 09/09/2023 1118   MCV 80 04/25/2020 1154   MCH 31.1 09/09/2023 1118   MCHC 35.1 09/09/2023 1118   RDW 13.6 09/09/2023 1118   RDW 16.8 (H) 04/25/2020 1154   LYMPHSABS 1.2 09/09/2023 1118   LYMPHSABS 1.4 04/25/2020 1154   MONOABS 0.2 09/09/2023 1118   EOSABS 0.1 09/09/2023 1118   EOSABS 0.0 04/25/2020 1154   BASOSABS 0.1 09/09/2023 1118   BASOSABS 0.1 04/25/2020 1154    Hgb A1C Lab Results  Component Value Date   HGBA1C 5.4 07/13/2023       Assessment and Plan:  Assessment and Plan    Attention-Deficit/Hyperactivity  Disorder (ADHD) ADHD symptoms worsening, impacting work and daily life.  Discussed challenges with ADHD medication prescriptions and potential alternatives. - Refer to [facility] for ADHD testing. - Instruct to schedule a new patient appointment with [facility].   RTC in 2 months for follow-up of chronic conditions  Follow Up Instructions:    I discussed the assessment and treatment plan with the patient. The patient was provided an opportunity to ask questions and all were answered. The patient agreed with the plan and demonstrated an understanding of the instructions.   The patient was advised to call back or seek an in-person evaluation if the symptoms worsen or if the condition fails to improve as anticipated.   Nicki Reaper, NP

## 2023-11-16 NOTE — Patient Instructions (Signed)

## 2023-12-08 ENCOUNTER — Other Ambulatory Visit: Payer: Self-pay | Admitting: Internal Medicine

## 2023-12-08 ENCOUNTER — Other Ambulatory Visit: Payer: Self-pay | Admitting: Nurse Practitioner

## 2023-12-08 ENCOUNTER — Other Ambulatory Visit: Payer: Self-pay

## 2023-12-09 NOTE — Telephone Encounter (Signed)
 Requested medications are due for refill today.  yes  Requested medications are on the active medications list.  yes  Last refill. varied  Future visit scheduled.   yes  Notes to clinic.  Refills not delegated.    Requested Prescriptions  Pending Prescriptions Disp Refills   ALPRAZolam (XANAX) 0.25 MG tablet [Pharmacy Med Name: ALPRAZOLAM 0.25 MG TABLET] 20 tablet 0    Sig: TAKE 1 TABLET BY MOUTH TWICE A DAY AS NEEDED     Not Delegated - Psychiatry: Anxiolytics/Hypnotics 2 Failed - 12/09/2023  4:27 PM      Failed - This refill cannot be delegated      Failed - Urine Drug Screen completed in last 360 days      Failed - Valid encounter within last 6 months    Recent Outpatient Visits           3 weeks ago Inattention   [facility] [place], Salvadore Oxford, NP       Future Appointments             In 1 month [place], Salvadore Oxford, NP [facility], [facility]            Passed - Patient is not pregnant       cyclobenzaprine (FLEXERIL) 10 MG tablet [Pharmacy Med Name: CYCLOBENZAPRINE 10 MG TABLET] 15 tablet 5    Sig: TAKE 1 TABLET (10 MG TOTAL) BY MOUTH DAILY AS NEEDED (MIGRAINES).     Not Delegated - Analgesics:  Muscle Relaxants Failed - 12/09/2023  4:27 PM      Failed - This refill cannot be delegated      Failed - Valid encounter within last 6 months    Recent Outpatient Visits           3 weeks ago Inattention   [facility] [place], Salvadore Oxford, NP       Future Appointments             In 1 month [place], Salvadore Oxford, NP  [facility], [facility]

## 2023-12-22 ENCOUNTER — Ambulatory Visit: Payer: Self-pay | Admitting: Neurology

## 2023-12-27 ENCOUNTER — Ambulatory Visit
Admission: RE | Admit: 2023-12-27 | Discharge: 2023-12-27 | Disposition: A | Discharge status: Home / Self Care | Source: Ambulatory Visit | Attending: Hematology | Admitting: Hematology

## 2023-12-27 ENCOUNTER — Encounter: Payer: Self-pay | Admitting: Hematology

## 2023-12-27 DIAGNOSIS — Z1231 Encounter for screening mammogram for malignant neoplasm of breast: Secondary | ICD-10-CM

## 2024-01-06 ENCOUNTER — Other Ambulatory Visit: Payer: Self-pay | Admitting: Nurse Practitioner

## 2024-01-10 ENCOUNTER — Ambulatory Visit: Payer: Self-pay | Admitting: Internal Medicine

## 2024-01-12 ENCOUNTER — Encounter: Payer: Self-pay | Admitting: Internal Medicine

## 2024-01-13 MED ORDER — BUPROPION HCL ER (XL) 150 MG PO TB24: 150.0000 mg | ORAL_TABLET | Freq: Every day | ORAL | 0 refills | Status: DC

## 2024-01-13 NOTE — Addendum Note (Signed)
 Addended by: Carollynn Cirri on: 01/13/2024 10:56 AM   Modules accepted: Orders

## 2024-01-14 ENCOUNTER — Ambulatory Visit: Payer: Self-pay | Admitting: Neurology

## 2024-02-03 ENCOUNTER — Encounter: Payer: Self-pay | Admitting: Internal Medicine

## 2024-02-03 ENCOUNTER — Ambulatory Visit: Admitting: Internal Medicine

## 2024-02-03 VITALS — BP 130/82 | Ht 65.0 in | Wt 226.0 lb

## 2024-02-03 DIAGNOSIS — I10 Essential (primary) hypertension: Secondary | ICD-10-CM

## 2024-02-03 DIAGNOSIS — D519 Vitamin B12 deficiency anemia, unspecified: Secondary | ICD-10-CM

## 2024-02-03 DIAGNOSIS — E039 Hypothyroidism, unspecified: Secondary | ICD-10-CM

## 2024-02-03 DIAGNOSIS — I493 Ventricular premature depolarization: Secondary | ICD-10-CM

## 2024-02-03 DIAGNOSIS — E7800 Pure hypercholesterolemia, unspecified: Secondary | ICD-10-CM | POA: Diagnosis not present

## 2024-02-03 DIAGNOSIS — D508 Other iron deficiency anemias: Secondary | ICD-10-CM

## 2024-02-03 DIAGNOSIS — F411 Generalized anxiety disorder: Secondary | ICD-10-CM

## 2024-02-03 DIAGNOSIS — K219 Gastro-esophageal reflux disease without esophagitis: Secondary | ICD-10-CM

## 2024-02-03 DIAGNOSIS — B009 Herpesviral infection, unspecified: Secondary | ICD-10-CM

## 2024-02-03 DIAGNOSIS — R7303 Prediabetes: Secondary | ICD-10-CM | POA: Diagnosis not present

## 2024-02-03 DIAGNOSIS — Z853 Personal history of malignant neoplasm of breast: Secondary | ICD-10-CM

## 2024-02-03 DIAGNOSIS — G44221 Chronic tension-type headache, intractable: Secondary | ICD-10-CM

## 2024-02-03 DIAGNOSIS — N912 Amenorrhea, unspecified: Secondary | ICD-10-CM

## 2024-02-03 MED ORDER — MUPIROCIN 2 % EX OINT: 1.0000 | TOPICAL_OINTMENT | Freq: Two times a day (BID) | CUTANEOUS | 0 refills | Status: DC

## 2024-02-03 NOTE — Assessment & Plan Note (Signed)
 Encouraged diet and exercise for weight loss ?

## 2024-02-03 NOTE — Assessment & Plan Note (Signed)
 Continue cyclobenzaprine , fioricet, and nurtec as prescribed She will continue to follow with neurology

## 2024-02-03 NOTE — Patient Instructions (Signed)

## 2024-02-03 NOTE — Assessment & Plan Note (Signed)
 Continue valacyclovir as needed

## 2024-02-03 NOTE — Assessment & Plan Note (Signed)
 TSH and free T4 today We will adjust levothyroxine if needed based on labs

## 2024-02-03 NOTE — Progress Notes (Signed)
 Subjective:    Patient ID: [name], female    DOB: 09-Oct-1970, 53 y.o.   MRN: [id number]  HPI  Patient presents to clinic today for 28-month follow-up of chronic conditions.  Hypothyroidism:  She has noticed some weight gain. She is taking levothyroxine  as prescribed.  She does not follow with endocrinology.  Migraines: These occur 2-3 x month.  Triggered by stress and weather.  She takes nurtec, cyclobenzaprine , fioricet with good relief of symptoms.  She follows with neurology.  Anxiety: Persistent, managed on venlafaxine , bupropion  and xanax .  She is not currently seeing a therapist.  She denies depression, SI/HI.  HLD: Her last LDL was 153, triglycerides 211, 07/2023.  She is not taking atorvastatin  as prescribed.  She tries to consume low-fat diet.   Genital herpes: She denies recent outbreak.  She takes valacyclovir  as needed for outbreaks.  History of left breast cancer: Status post lumpectomy and radiation.  She is not taking exemestane , but is taking veozah  as prescribed and gabapentin  as needed for breast pain.  She follows with oncology.  PVCs: She failed beta-blockers in the past.  She is taking flecainide  as prescribed by cardiology.  ECG from 02/2023 reviewed.  Iron /b12 deficiency anemia: Her last H/H was 12.3/35, 09/2023.  She is not currently taking any oral iron  but is taking B12 shots.  She follows with hematology.  GERD: Triggered by spicy foods.  She takes omeprazole  only as needed.  There is no upper GI on file.  HTN: Her BP today is 130/82.  She is taking losartan  as prescribed.  ECG from 02/2023 reviewed.  Prediabetes: Her last A1c was 5.4%, 07/2023.  She is not taking any oral diabetic medication at this time.  She does not check her sugars.  Review of Systems     Past Medical History:  Diagnosis Date   Allergy     Anemia    present for many years, mild, stable   Anxiety    Breast cancer (HCC)    Breast cancer (HCC) 12/18/2020   Dysrhythmia     PAC's on flecanide (did not tolerate beta blockers)   Family history of breast cancer 01/20/2021   Family history of prostate cancer 01/20/2021   Frequent headaches    HSV infection    fever blisters   Hypertension    Hypothyroidism    Personal history of radiation therapy     Current Outpatient Medications  Medication Sig Dispense Refill   ALPRAZolam  (XANAX ) 0.25 MG tablet TAKE 1 TABLET BY MOUTH TWICE A DAY AS NEEDED 20 tablet 0   atorvastatin  (LIPITOR) 10 MG tablet TAKE 1 TABLET BY MOUTH EVERY DAY 90 tablet 1   buPROPion  (WELLBUTRIN  XL) 150 MG 24 hr tablet Take 1 tablet (150 mg total) by mouth daily. 90 tablet 0   butalbital -acetaminophen -caffeine  (FIORICET) 50-325-40 MG tablet TAKE 1 TABLET BY MOUTH EVERY 6 HOURS AS NEEDED FOR HEADACHE. 20 tablet 5   cyanocobalamin  (VITAMIN B12) 1000 MCG/ML injection Inject 1 mL (1,000 mcg total) into the muscle every 30 (thirty) days. 30 mL 6   cyclobenzaprine  (FLEXERIL ) 10 MG tablet TAKE 1 TABLET (10 MG TOTAL) BY MOUTH DAILY AS NEEDED (MIGRAINES). 15 tablet 5   exemestane  (AROMASIN ) 25 MG tablet Take 1 tablet (25 mg total) by mouth daily after breakfast. 30 tablet 6   Fezolinetant  (VEOZAH ) 45 MG TABS Take 1 tablet (45 mg total) by mouth daily. 30 tablet 1   flecainide  (TAMBOCOR ) 50 MG tablet TAKE 1 TABLET BY MOUTH  TWICE A DAY 180 tablet 3   fluticasone  (FLONASE ) 50 MCG/ACT nasal spray SPRAY 2 SPRAYS INTO EACH NOSTRIL EVERY DAY (Patient taking differently: Place 2 sprays into both nostrils daily as needed.) 48 mL 0   gabapentin  (NEURONTIN ) 300 MG capsule TAKE 1 CAPSULE BY MOUTH TWICE A DAY 60 capsule 1   KLOR-CON  M10 10 MEQ tablet TAKE 1 TABLET BY MOUTH TWICE A DAY 180 tablet 1   levocetirizine (XYZAL ) 5 MG tablet TAKE 1 TABLET BY MOUTH EVERY DAY IN THE EVENING 90 tablet 3   levothyroxine  (SYNTHROID ) 88 MCG tablet TAKE 1 TABLET BY MOUTH EVERY DAY 90 tablet 1   losartan  (COZAAR ) 50 MG tablet TAKE 1 TABLET BY MOUTH EVERY DAY 90 tablet 1   NURTEC 75  MG TBDP Take 1 tablet (75 mg total) by mouth daily as needed (migraine headache). Max 1 tablet in 24 hours. 8 tablet 2   omeprazole  (PRILOSEC) 20 MG capsule TAKE 1 CAPSULE BY MOUTH EVERY DAY 90 capsule 1   valACYclovir  (VALTREX ) 500 MG tablet TAKE 1 TABLET (500 MG TOTAL) BY MOUTH DAILY AS NEEDED (HERPES SIMPLEX). 90 tablet 1   venlafaxine  XR (EFFEXOR -XR) 75 MG 24 hr capsule TAKE 1 CAPSULE BY MOUTH DAILY WITH BREAKFAST. 90 capsule 0   No current facility-administered medications for this visit.   Facility-Administered Medications Ordered in Other Visits  Medication Dose Route Frequency Provider Last Rate Last Admin   cyanocobalamin  (VITAMIN B12) injection 1,000 mcg  1,000 mcg Intramuscular Once Sonja [place], MD        Allergies  Allergen Reactions   Ciprofloxacin     anxiety   Keflex [Cephalexin] Hives   Trazodone  And Nefazodone Other (See Comments)    nightmares    Family History  Problem Relation Age of Onset   Depression Mother    Breast cancer Mother 22   Hyperlipidemia Father    Prostate cancer Father 2    Social History   Socioeconomic History   Marital status: Single    Spouse name: Not on file   Number of children: 1   Years of education: Not on file   Highest education level: Bachelor's degree (e.g., BA, AB, BS)  Occupational History   Not on file  Tobacco Use   Smoking status: Never   Smokeless tobacco: Never  Vaping Use   Vaping status: Never Used  Substance and Sexual Activity   Alcohol use: Yes    Comment: occasional   Drug use: No   Sexual activity: Yes    Partners: Male    Birth control/protection: Pill  Other Topics Concern   Not on file  Social History Narrative   Not on file   Social Drivers of Health   Financial Resource Strain: Medium Risk (11/16/2023)   Overall Financial Resource Strain (CARDIA)    Difficulty of Paying Living Expenses: Somewhat hard  Food Insecurity: No Food Insecurity (11/16/2023)   Hunger Vital Sign    Worried About  Running Out of Food in the Last Year: Never true    Ran Out of Food in the Last Year: Never true  Transportation Needs: No Transportation Needs (11/16/2023)   PRAPARE - Administrator, Civil Service (Medical): No    Lack of Transportation (Non-Medical): No  Physical Activity: Insufficiently Active (11/16/2023)   Exercise Vital Sign    Days of Exercise per Week: 2 days    Minutes of Exercise per Session: 10 min  Stress: Stress Concern Present (11/16/2023)   [employer]  [employer] - Occupational Stress Questionnaire    Feeling of Stress : To some extent  Social Connections: Socially Isolated (11/16/2023)   Social Connection and Isolation Panel [NHANES]    Frequency of Communication with Friends and Family: Once a week    Frequency of Social Gatherings with Friends and Family: Once a week    Attends Religious Services: Never    Database administrator or Organizations: No    Attends Engineer, structural: Not on file    Marital Status: Never married  Intimate Partner Violence: Not At Risk (01/02/2021)   Humiliation, Afraid, Rape, and Kick questionnaire    Fear of Current or Ex-Partner: No    Emotionally Abused: No    Physically Abused: No    Sexually Abused: No     Constitutional: Patient reports intermittent headaches, weight gain.  Denies fever, malaise, fatigue.  HEENT: Pt reports nasal discomfort. Denies eye pain, eye redness, ear pain, ringing in the ears, wax buildup, runny nose, nasal congestion, bloody nose, or sore throat. Respiratory: Denies difficulty breathing, shortness of breath, cough or sputum production.   Cardiovascular: Denies chest pain, chest tightness, palpitations or swelling in the hands or feet.  Gastrointestinal: Patient reports intermittent reflux.  Denies abdominal pain, bloating, constipation, diarrhea or blood in the stool.  GU: Patient reports amenorrhea.  Denies urgency, frequency, pain with urination, burning sensation,  blood in urine, odor or discharge. Musculoskeletal: Denies decrease in range of motion, difficulty with gait, muscle pain or joint pain and swelling.  Skin: Denies redness, rashes, lesions or ulcercations.  Neurological: Denies dizziness, difficulty with memory, difficulty with speech or problems with balance and coordination.  Psych: Patient has a history of anxiety.  Denies depression, SI/HI.  No other specific complaints in a complete review of systems (except as listed in HPI above).  Objective:   Physical Exam   BP 130/82 (BP Location: Right Arm, Patient Position: Sitting, Cuff Size: Normal)   Ht 5\' 5"  (1.651 m)   Wt 226 lb (102.5 kg)   BMI 37.61 kg/m    Wt Readings from Last 3 Encounters:  09/09/23 219 lb 1.6 oz (99.4 kg)  07/13/23 207 lb (93.9 kg)  07/06/23 208 lb (94.3 kg)    General: Appears her stated age, obese, in NAD. Skin: Warm, dry and intact.  HEENT: Head: normal shape and size; Eyes: sclera white, no icterus, conjunctiva pink, PERRLA and EOMs intact; Nose: scabbed fissure noted of left side of nare. Neck:  Neck supple, trachea midline. No masses, lumps or thyromegaly present.  Cardiovascular: Normal rate and rhythm. S1,S2 noted.  No murmur, rubs or gallops noted. No JVD or BLE edema. No carotid bruits noted. Pulmonary/Chest: Normal effort and positive vesicular breath sounds. No respiratory distress. No wheezes, rales or ronchi noted.  Abdomen: Normal bowel sounds.  Musculoskeletal:  No difficulty with gait.  Neurological: Alert and oriented. Coordination normal.  Psychiatric: Mood and affect normal. Behavior is normal. Judgment and thought content normal.    BMET    Component Value Date/Time   NA 140 09/09/2023 1118   NA 143 01/26/2020 1600   K 3.5 09/09/2023 1118   CL 104 09/09/2023 1118   CO2 30 09/09/2023 1118   GLUCOSE 98 09/09/2023 1118   BUN 11 09/09/2023 1118   BUN 11 01/26/2020 1600   CREATININE 0.99 09/09/2023 1118   CREATININE 0.87  07/13/2023 1355   CALCIUM  9.2 09/09/2023 1118   GFRNONAA >60 09/09/2023 1118   GFRAA >  60 02/25/2020 2231    Lipid Panel     Component Value Date/Time   CHOL 237 (H) 07/13/2023 1355   TRIG 211 (H) 07/13/2023 1355   HDL 46 (L) 07/13/2023 1355   CHOLHDL 5.2 (H) 07/13/2023 1355   VLDL 18.8 12/21/2019 1204   LDLCALC 153 (H) 07/13/2023 1355    CBC    Component Value Date/Time   WBC 4.1 09/09/2023 1118   WBC 5.9 07/13/2023 1355   RBC 3.96 09/09/2023 1118   HGB 12.3 09/09/2023 1118   HGB 10.1 (L) 04/25/2020 1154   HCT 35.0 (L) 09/09/2023 1118   HCT 31.6 (L) 04/25/2020 1154   PLT 205 09/09/2023 1118   PLT 261 04/25/2020 1154   MCV 88.4 09/09/2023 1118   MCV 80 04/25/2020 1154   MCH 31.1 09/09/2023 1118   MCHC 35.1 09/09/2023 1118   RDW 13.6 09/09/2023 1118   RDW 16.8 (H) 04/25/2020 1154   LYMPHSABS 1.2 09/09/2023 1118   LYMPHSABS 1.4 04/25/2020 1154   MONOABS 0.2 09/09/2023 1118   EOSABS 0.1 09/09/2023 1118   EOSABS 0.0 04/25/2020 1154   BASOSABS 0.1 09/09/2023 1118   BASOSABS 0.1 04/25/2020 1154    Hgb A1C Lab Results  Component Value Date   HGBA1C 5.4 07/13/2023           Assessment & Plan:   Nasal lesion:  Avoid rubbing the area and picking of the scab Rx for Bactroban  2% ointment twice daily as needed  Amenorrhea:  Will check FSH/LH  RTC in 6 months for your annual exam Helayne Lo, NP

## 2024-02-03 NOTE — Assessment & Plan Note (Signed)
 B12 levels followed by hematology Will follow

## 2024-02-03 NOTE — Assessment & Plan Note (Signed)
 A1c today Encourage low-carb diet and exercise for weight loss

## 2024-02-03 NOTE — Assessment & Plan Note (Signed)
 CBC and today She will continue to follow with hematology

## 2024-02-03 NOTE — Assessment & Plan Note (Signed)
 Avoid foods that trigger reflux Encourage weight loss as this can help reduce reflux symptoms Continue omeprazole as needed

## 2024-02-03 NOTE — Assessment & Plan Note (Signed)
 Not taking exemestane  as prescribed Taking veozah  as prescribed and gabapentin  as needed for breast pain

## 2024-02-03 NOTE — Assessment & Plan Note (Signed)
 C-Met and lipid profile today Encouraged her to consume a low-fat diet Encourage compliance with atorvastatin 

## 2024-02-03 NOTE — Assessment & Plan Note (Signed)
Controlled on losartan Reinforced DASH diet and exercise for weight loss C-Met today

## 2024-02-03 NOTE — Assessment & Plan Note (Signed)
 Failed beta-blockers in the past Continue flecainide  per cardiology

## 2024-02-03 NOTE — Assessment & Plan Note (Signed)
 Stable on bupropion , sertraline  and xanax , wean not indicated Support offered

## 2024-02-04 ENCOUNTER — Ambulatory Visit: Payer: Self-pay | Admitting: Internal Medicine

## 2024-02-04 LAB — CBC
HCT: 37.3 % (ref 35.0–45.0)
Hemoglobin: 12.2 g/dL (ref 11.7–15.5)
MCH: 29.4 pg (ref 27.0–33.0)
MCHC: 32.7 g/dL (ref 32.0–36.0)
MCV: 89.9 fL (ref 80.0–100.0)
MPV: 9.7 fL (ref 7.5–12.5)
Platelets: 231 10*3/uL (ref 140–400)
RBC: 4.15 10*6/uL (ref 3.80–5.10)
RDW: 14.3 % (ref 11.0–15.0)
WBC: 3.7 10*3/uL — ABNORMAL LOW (ref 3.8–10.8)

## 2024-02-04 LAB — HEMOGLOBIN A1C
Hgb A1c MFr Bld: 5.6 % (ref <5.7)
Mean Plasma Glucose: 114 mg/dL
eAG (mmol/L): 6.3 mmol/L

## 2024-02-04 LAB — COMPREHENSIVE METABOLIC PANEL WITH GFR
AG Ratio: 1.4 (calc) (ref 1.0–2.5)
ALT: 8 U/L (ref 6–29)
AST: 11 U/L (ref 10–35)
Albumin: 4.2 g/dL (ref 3.6–5.1)
Alkaline phosphatase (APISO): 121 U/L (ref 37–153)
BUN: 11 mg/dL (ref 7–25)
CO2: 33 mmol/L — ABNORMAL HIGH (ref 20–32)
Calcium: 9.4 mg/dL (ref 8.6–10.4)
Chloride: 100 mmol/L (ref 98–110)
Creat: 1.03 mg/dL (ref 0.50–1.03)
Globulin: 2.9 g/dL (ref 1.9–3.7)
Glucose, Bld: 103 mg/dL — ABNORMAL HIGH (ref 65–99)
Potassium: 4.1 mmol/L (ref 3.5–5.3)
Sodium: 140 mmol/L (ref 135–146)
Total Bilirubin: 0.5 mg/dL (ref 0.2–1.2)
Total Protein: 7.1 g/dL (ref 6.1–8.1)
eGFR: 66 mL/min/{1.73_m2} (ref >=60)

## 2024-02-04 LAB — TSH: TSH: 1.39 m[IU]/L

## 2024-02-04 LAB — FSH/LH
FSH: 144.3 m[IU]/mL — ABNORMAL HIGH
LH: 86.3 m[IU]/mL — ABNORMAL HIGH

## 2024-02-04 LAB — LIPID PANEL
Cholesterol: 261 mg/dL — ABNORMAL HIGH (ref <200)
HDL: 48 mg/dL — ABNORMAL LOW (ref >=50)
LDL Cholesterol (Calc): 186 mg/dL — ABNORMAL HIGH
Non-HDL Cholesterol (Calc): 213 mg/dL — ABNORMAL HIGH (ref <130)
Total CHOL/HDL Ratio: 5.4 (calc) — ABNORMAL HIGH (ref <5.0)
Triglycerides: 131 mg/dL (ref <150)

## 2024-02-04 LAB — T4, FREE: Free T4: 1.1 ng/dL (ref 0.8–1.8)

## 2024-02-20 ENCOUNTER — Other Ambulatory Visit: Payer: Self-pay | Admitting: Nurse Practitioner

## 2024-02-21 ENCOUNTER — Encounter: Payer: Self-pay | Admitting: Hematology

## 2024-03-01 NOTE — Progress Notes (Unsigned)
 Electrophysiology Clinic Note    Date:  03/02/2024  Patient ID:  [name], [name] 10/05/1970, MRN [id number] PCP:  Antonette Angeline ORN, NP  Cardiologist:  Evalene Lunger, MD Electrophysiologist: Elspeth Sage, MD   Discussed the use of AI scribe software for clinical note transcription with the patient, who gave verbal consent to proceed.   Patient Profile    Chief Complaint: PAC, flecainide  follow-up  History of Present Illness: [name] is a 53 y.o. female with PMH notable for PAC, NSVT, HTN; seen today for Elspeth Sage, MD for routine electrophysiology followup.   I last saw patient 02/2023 where she was having frequent, brief palpitations not able to capture on her apple watch. Her PACs prevoiusly well-controlled on flecainide  (without BB or CCB as per Dr. Sage). Updated monitor with 1.2% PAC, brief SVT episodes, triggered events associated with sinus rhythm.   On follow-up today, she inadvertently self-reduced her flecainide  to daily. She has noticed no significant change in her palpitations since her last visit. She has noticed that her palps tend to increase in frequency when she is dehydrated .  One of her providers would like to start her on ADHD treatment with stimulants and she questions whether this is safe to do while taking flecainide .   She denies chest pain, chest pressure, syncope.    Arrhythmia/Device History Flecainide       ROS:  Please see the history of present illness. All other systems are reviewed and otherwise negative.    Physical Exam    VS:  BP 118/82 (BP Location: Right Arm, Patient Position: Sitting, Cuff Size: Large)   Ht 5' 5 (1.651 m)   Wt 222 lb (100.7 kg)   SpO2 97%   BMI 36.94 kg/m  BMI: Body mass index is 36.94 kg/m.      Wt Readings from Last 3 Encounters:  03/02/24 222 lb (100.7 kg)  02/03/24 226 lb (102.5 kg)  09/09/23 219 lb 1.6 oz (99.4 kg)     GEN- The patient is well appearing, alert and oriented x  3 today.   Lungs- Clear to ausculation bilaterally, normal work of breathing.  Heart- Regular rate and rhythm, no murmurs, rubs or gallops Extremities- No peripheral edema, warm, dry    Studies Reviewed   Previous EP, cardiology notes.    EKG is ordered. Personal review of EKG from today shows:    EKG Interpretation Date/Time:  Thursday March 02 2024 10:48:59 EDT Ventricular Rate:  70 PR Interval:  122 QRS Duration:  84 QT Interval:  402 QTC Calculation: 434 R Axis:   -15  Text Interpretation: Normal sinus rhythm Confirmed by Laportia Carley [phone]) on 03/02/2024 10:51:25 AM    02/2023 EKG - SR, PR 132; QRS 84  Long term monitor, 03/17/2023 Normal sinus rhythm Patient had a min HR of 55 bpm, max HR of 169 bpm, and avg HR of 88 bpm.   1 run of accelerated junctional rhythm occurred lasting 6 beats with a max rate of 104 bpm (avg 100 bpm).    4 Supraventricular Tachycardia runs occurred, the run with the fastest interval lasting 5 beats with a max rate of 169 bpm, the longest lasting 8 beats with an avg rate of 112 bpm.   Isolated SVEs were occasional (1.2%, 17990), SVE Couplets were rare (<1.0%, 16), and SVE Triplets were rare (<1.0%, 1).  Isolated VEs were rare (<1.0%, 3166), VE Couplets were rare (<1.0%, 3), and VE Triplets were rare (<1.0%, 1).  Ventricular Bigeminy was present.    Patient triggered events (9) associated with normal sinus rhythm  TTE, 01/21/2021  1. Left ventricular ejection fraction, by estimation, is 55 to 60%. The left ventricle has normal function. The left ventricle has no regional wall motion abnormalities. There is mild left ventricular hypertrophy. Left ventricular diastolic parameters are indeterminate.   2. Right ventricular systolic function is normal. The right ventricular  size is normal. There is normal pulmonary artery systolic pressure.   3. Left atrial size was mildly dilated.   4. The mitral valve is normal in structure. Trivial mitral valve   regurgitation. No evidence of mitral stenosis.   5. The aortic valve has an indeterminant number of cusps. Aortic valve regurgitation is not visualized. No aortic stenosis is present.   6. The inferior vena cava is normal in size with greater than 50%  respiratory variability, suggesting right atrial pressure of 3 mmHg.      Assessment and Plan     #) PACs #) palpitations #) flecainide  monitoring Recent monitor with low burden of PVCs and PACs, very brief SVT. Triggered events associated with sinus rhythm.  EKG with stable measurements on flecainide  Patient inadvertently self-reduced flecainide  to 50mg  daily, has previously been intolerant to BB and CCB Will trial stopping flecainide  given that she has realized her palpitations tend to be more symptomatic when she is dehydrated, query flecainide 's effectiveness Discuss that she appears to be hyper aware of her PACs, but they are more bothersome than dangerous at this low level  OK to initiate ADHD treatment      Current medicines are reviewed at length with the patient today.   The patient has concerns regarding her medicines.  The following changes were made today:   STOP flecainide   Labs/ tests ordered today include:  Orders Placed This Encounter  Procedures   EKG 12-Lead     Disposition: Follow up with EP APP  in 6 months. We also discussed that if her palpitations remain stable, ok to follow-up with EP PRN.     Signed, Chantal Needle, NP  03/02/24  11:20 AM  Electrophysiology [facility]

## 2024-03-02 ENCOUNTER — Encounter: Payer: Self-pay | Admitting: Hematology

## 2024-03-02 ENCOUNTER — Ambulatory Visit: Attending: Cardiology | Admitting: Cardiology

## 2024-03-02 VITALS — BP 118/82 | Ht 65.0 in | Wt 222.0 lb

## 2024-03-02 DIAGNOSIS — Z79899 Other long term (current) drug therapy: Secondary | ICD-10-CM

## 2024-03-02 DIAGNOSIS — Z5181 Encounter for therapeutic drug level monitoring: Secondary | ICD-10-CM | POA: Diagnosis not present

## 2024-03-02 DIAGNOSIS — R002 Palpitations: Secondary | ICD-10-CM

## 2024-03-02 DIAGNOSIS — I491 Atrial premature depolarization: Secondary | ICD-10-CM

## 2024-03-02 NOTE — Patient Instructions (Signed)
 Medication Instructions:  Your physician recommends the following medication changes.  STOP TAKING: Flecainide    *If you need a refill on your cardiac medications before your next appointment, please call your pharmacy*  Lab Work: None ordered at this time   Follow-Up: At [facility], you and your health needs are our priority.  As part of our continuing mission to provide you with exceptional heart care, our providers are all part of one team.  This team includes your primary Cardiologist (physician) and Advanced Practice Providers or APPs (Physician Assistants and Nurse Practitioners) who all work together to provide you with the care you need, when you need it.  Your next appointment:   6 month(s) -- ONLY IF NEEDED  Provider:   Suzann Riddle, NP

## 2024-03-08 ENCOUNTER — Other Ambulatory Visit: Payer: Self-pay

## 2024-03-08 DIAGNOSIS — C50412 Malignant neoplasm of upper-outer quadrant of left female breast: Secondary | ICD-10-CM

## 2024-03-09 ENCOUNTER — Inpatient Hospital Stay: Payer: 59 | Admitting: Hematology

## 2024-03-09 ENCOUNTER — Inpatient Hospital Stay: Payer: 59 | Attending: Nurse Practitioner

## 2024-03-09 VITALS — BP 120/70 | HR 82 | Temp 98.2°F | Resp 15 | Ht 65.0 in | Wt 221.8 lb

## 2024-03-09 DIAGNOSIS — Z853 Personal history of malignant neoplasm of breast: Secondary | ICD-10-CM | POA: Diagnosis not present

## 2024-03-09 DIAGNOSIS — Z170 Estrogen receptor positive status [ER+]: Secondary | ICD-10-CM | POA: Diagnosis not present

## 2024-03-09 DIAGNOSIS — Z79899 Other long term (current) drug therapy: Secondary | ICD-10-CM | POA: Insufficient documentation

## 2024-03-09 DIAGNOSIS — Z1732 Human epidermal growth factor receptor 2 negative status: Secondary | ICD-10-CM | POA: Diagnosis not present

## 2024-03-09 DIAGNOSIS — Z1721 Progesterone receptor positive status: Secondary | ICD-10-CM | POA: Insufficient documentation

## 2024-03-09 DIAGNOSIS — C50412 Malignant neoplasm of upper-outer quadrant of left female breast: Secondary | ICD-10-CM | POA: Insufficient documentation

## 2024-03-09 DIAGNOSIS — Z923 Personal history of irradiation: Secondary | ICD-10-CM | POA: Insufficient documentation

## 2024-03-09 LAB — CBC WITH DIFFERENTIAL (CANCER CENTER ONLY)
Abs Immature Granulocytes: 0.01 10*3/uL (ref 0.00–0.07)
Basophils Absolute: 0.1 10*3/uL (ref 0.0–0.1)
Basophils Relative: 2 %
Eosinophils Absolute: 0.1 10*3/uL (ref 0.0–0.5)
Eosinophils Relative: 2 %
HCT: 35.5 % — ABNORMAL LOW (ref 36.0–46.0)
Hemoglobin: 12.1 g/dL (ref 12.0–15.0)
Immature Granulocytes: 0 %
Lymphocytes Relative: 31 %
Lymphs Abs: 1.2 10*3/uL (ref 0.7–4.0)
MCH: 29.2 pg (ref 26.0–34.0)
MCHC: 34.1 g/dL (ref 30.0–36.0)
MCV: 85.7 fL (ref 80.0–100.0)
Monocytes Absolute: 0.2 10*3/uL (ref 0.1–1.0)
Monocytes Relative: 6 %
Neutro Abs: 2.3 10*3/uL (ref 1.7–7.7)
Neutrophils Relative %: 59 %
Platelet Count: 233 10*3/uL (ref 150–400)
RBC: 4.14 MIL/uL (ref 3.87–5.11)
RDW: 13.6 % (ref 11.5–15.5)
WBC Count: 3.9 10*3/uL — ABNORMAL LOW (ref 4.0–10.5)
nRBC: 0 % (ref 0.0–0.2)

## 2024-03-09 LAB — CMP (CANCER CENTER ONLY)
ALT: 11 U/L (ref 0–44)
AST: 15 U/L (ref 15–41)
Albumin: 4 g/dL (ref 3.5–5.0)
Alkaline Phosphatase: 106 U/L (ref 38–126)
Anion gap: 5 (ref 5–15)
BUN: 10 mg/dL (ref 6–20)
CO2: 30 mmol/L (ref 22–32)
Calcium: 9.4 mg/dL (ref 8.9–10.3)
Chloride: 104 mmol/L (ref 98–111)
Creatinine: 0.97 mg/dL (ref 0.44–1.00)
GFR, Estimated: >60 mL/min (ref >60)
Glucose, Bld: 105 mg/dL — ABNORMAL HIGH (ref 70–99)
Potassium: 3.8 mmol/L (ref 3.5–5.1)
Sodium: 139 mmol/L (ref 135–145)
Total Bilirubin: 0.3 mg/dL (ref 0.0–1.2)
Total Protein: 7 g/dL (ref 6.5–8.1)

## 2024-03-09 NOTE — Assessment & Plan Note (Signed)
 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 Biopsy of the left breast mass on 10/31/20 was again benign but felt to be discordant. -left lumpectomy under Dr. Vanderbilt on 12/18/20 showed invasive lobular carcinoma, 1.1 cm, grade 2, negative resection margins. ER+ (weakly), PR+, Her2-. RS 16 with less than 1% benefit of chemotherapy.  -Her 01/07/21 Sentinel LN biopsy was negative.  -Her genetic testing from 01/16/21 was negative for pathogenetic mutations.  -S/p adjuvant radiation with Dr. Dewey on 02/05/21 - 03/19/21.  -she was prescribed adjuvant tamoxifen  in April 2023 but did not start; [facility] 10/05/22 confirmed menopause and she switched to Anastrozole . However, due to joint pain and hot flashes, she only took it for 3 weeks and stopped on her own. She later tried exemestane  and only took for a few days  -continue breast cancer surveillance

## 2024-03-09 NOTE — Progress Notes (Signed)
 [facility]   Telephone:([phone] Fax:([phone]) [phone]   Clinic Follow up Note   Patient Care Team: Antonette Angeline ORN, NP as PCP - General (Internal Medicine) Perla Evalene PARAS, MD as PCP - Cardiology (Cardiology) Fernande Elspeth BROCKS, MD as PCP - Electrophysiology (Cardiology) Vanderbilt Ned, MD as Consulting Physician (General Surgery) Lanny Callander, MD as Consulting Physician (Hematology) Burton, Lacie K, NP as Nurse Practitioner (Nurse Practitioner) Dewey Rush, MD as Consulting Physician (Radiation Oncology)  Date of Service:  03/09/2024  CHIEF COMPLAINT: f/u of left breast cancer  CURRENT THERAPY:  Surveillance  Oncology History   History of left breast cancer Stage IA, (pT1c, pN0), ER+/PR+/HER2-, Grade 2, RS 16  -She was under short-term follow up after a benign left breast biopsy. Biopsy of the left breast mass on 10/31/20 was again benign but felt to be discordant. -left lumpectomy under Dr. Vanderbilt on 12/18/20 showed invasive lobular carcinoma, 1.1 cm, grade 2, negative resection margins. ER+ (weakly), PR+, Her2-. RS 16 with less than 1% benefit of chemotherapy.  -Her 01/07/21 Sentinel LN biopsy was negative.  -Her genetic testing from 01/16/21 was negative for pathogenetic mutations.  -S/p adjuvant radiation with Dr. Dewey on 02/05/21 - 03/19/21.  -she was prescribed adjuvant tamoxifen  in April 2023 but did not start; [facility] 10/05/22 confirmed menopause and she switched to Anastrozole . However, due to joint pain and hot flashes, she only took it for 3 weeks and stopped on her own. She later tried exemestane  and only took for a few days  -continue breast cancer surveillance   Assessment & Plan Breast cancer stage I Diagnosed in 2022 with a low recurrence score. Currently not on medication due to intolerance to anastrozole  and tamoxifen , which caused joint pain and hot flashes. Risk of recurrence is low, but family history of metastatic breast cancer is noted. She is  postmenopausal and opts for regular screenings instead of medication. Tamoxifen  remains an option if she decides to resume medication. - Continue regular screenings with alternating ultrasound and MRI - Monitor for any new pain, especially in bones, and report if persistent or nocturnal - Reconsider tamoxifen  if willing; she will think about it and contact provider if decision changes   Cording issues post-surgery Cording issues persist post-surgery, exacerbated by physical activity. Previously managed with physical therapy and gabapentin  as needed. - Continue using gabapentin  as needed for cording pain - Encourage home exercises and stretching to manage symptoms  Plan - She is clinically doing well, exam was unremarkable, labs reviewed, no clinical concern for recurrence - I recommend her to try tamoxifen , she will think about it, and call me if agrees. - Continue breast cancer surveillance - Lab and follow-up with NP in 6 months     SUMMARY OF ONCOLOGIC HISTORY: Oncology History Overview Note  Cancer Staging Carcinoma of upper-outer quadrant of left breast, estrogen receptor positive (HCC) Staging form: Breast, AJCC 8th Edition - Pathologic stage from 01/02/2021: No Stage Recommended (pT1c, cN0, cM0, G2, ER+, PR+, HER2-) - Unsigned    History of left breast cancer  10/28/2020 Imaging   DIGITAL DIAGNOSTIC UNILATERAL LEFT MAMMOGRAM WITH TOMOSYNTHESIS AND CAD; ULTRASOUND LEFT BREAST LIMITED  CLINICAL DATA: 53 year old female presenting for short-term follow-up of a left breast biopsy demonstrating fibrocystic changes.  IMPRESSION: Indeterminate left breast mass at 2 o'clock 7 cm from the nipple. Targeted ultrasound of the left axilla demonstrates normal lymph nodes.   10/31/2020 Pathology Results   Breast, left, needle core biopsy, 2 o'clock - FIBROADIPOSE TISSUE  WITH MINIMAL BREAST PARENCHYMA DEMONSTRATING MILD FIBROCYSTIC CHANGE TO INCLUDE APOCRINE METAPLASIA.  This was  found to be discordant.   12/18/2020 Surgery   A. BREAST, LEFT, LUMPECTOMY:  - Invasive lobular carcinoma, 1.1 cm, grade 2.  See comment  - Resection margins are negative for carcinoma; inferior margin is  focally less than 1 mm from carcinoma and medial margin is approximately  1 mm from carcinoma  - Biopsy related changes  - Background fibrocystic change   PROGNOSTIC INDICATOR RESULTS:  - The tumor cells are NEGATIVE for Her2 (0).  - Estrogen Receptor:       POSITIVE, 30%, WEAK STAINING  - Progesterone Receptor:   POSITIVE, 80%, MODERATE STAINING  - Proliferation Marker Ki-67:   <5%    12/18/2020 Oncotype testing   Oncotype  Recurrence Score 16  Distant recurrence risk at 9 years of 4% with AI or Tamoxifen  alone.  Less than 1% benefit of chemotherapy.    01/02/2021 Initial Diagnosis   Carcinoma of upper-outer quadrant of left breast, estrogen receptor positive (HCC)   01/02/2021 Cancer Staging   Staging form: Breast, AJCC 8th Edition - Pathologic stage from 01/02/2021: Stage IA (pT1c, pN0, cM0, G2, ER+, PR+, HER2-) - Signed by Burton, Lacie K, NP on 05/12/2021 Method of lymph node assessment: Clinical Multigene prognostic tests performed: None Histologic grading system: 3 grade system Residual tumor (R): R0 - None   01/07/2021 Surgery   LEFT SENTINEL NODE BIOPSY by Dr Vanderbilt  FINAL MICROSCOPIC DIAGNOSIS:   A. LYMPH NODE, LEFT, SENTINEL, EXCISION:  - One lymph node negative for metastatic carcinoma (0/1).   B. LYMPH NODE, LEFT, SENTINEL, EXCISION:  - One lymph node negative for metastatic carcinoma (0/1).   COMMENT:  Immunohistochemistry for cytokeratin AE1/AE3 is performed on parts A and  B and no metastatic carcinoma is identified   02/05/2021 - 03/19/2021 Radiation Therapy   Adjuvant Radiation with Dr Dewey    05/13/2021 -  Anti-estrogen oral therapy   Tamoxifen  20mg  once daily starting in September 2022   05/13/2021 Survivorship   SCP delivered by Lacie Burton, NP    06/25/2023 Imaging   MR bilateral breasts with and without contrast  IMPRESSION: Stable lumpectomy changes in the left breast. No MRI evidence of malignancy in either breast.  RECOMMENDATION: 1.  Recommend annual mammography, due April 2025.  2. The American Cancer Society recommends annual MRI and mammography in patients with an estimated lifetime risk of developing breast cancer greater than 20 - 25%, or who are known or suspected to be positive for the breast cancer gene.  BI-RADS CATEGORY  2: Benign.        Discussed the use of AI scribe software for clinical note transcription with the patient, who gave verbal consent to proceed.  History of Present Illness [name] is a 53 year old female with breast cancer who presents for follow-up.  Diagnosed with early-stage breast cancer in 2022, she experiences cording down her side, which is tolerable and managed with gabapentin  as needed, particularly after overexertion. She previously attended physical therapy, which was beneficial but difficult to maintain due to her schedule.  She discontinued anastrozole  and tamoxifen  due to significant joint pain and hot flashes. Medication for hot flashes was helpful but is not currently used. She is postmenopausal and not on any cancer-related medications.  Her family history includes her mother passing away from metastatic breast cancer after being cancer-free for thirteen years. This influences her decision to undergo regular screenings, including  alternating between ultrasound and MRI.  No new bone pain or other symptoms. Her last mammogram in April was benign, and she continues regular lab work and screenings.     All other systems were reviewed with the patient and are negative.  MEDICAL HISTORY:  Past Medical History:  Diagnosis Date   Allergy     Anemia    present for many years, mild, stable   Anxiety    Breast cancer (HCC)    Breast cancer (HCC) 12/18/2020    Dysrhythmia    PAC's on flecanide (did not tolerate beta blockers)   Family history of breast cancer 01/20/2021   Family history of prostate cancer 01/20/2021   Frequent headaches    HSV infection    fever blisters   Hypertension    Hypothyroidism    Personal history of radiation therapy     SURGICAL HISTORY: Past Surgical History:  Procedure Laterality Date   APPENDECTOMY  2013   BREAST BIOPSY Left 10/2020   BREAST LUMPECTOMY Left 12/18/2020   BREAST LUMPECTOMY WITH RADIOACTIVE SEED LOCALIZATION Left 12/18/2020   Procedure: LEFT BREAST LUMPECTOMY WITH RADIOACTIVE SEED LOCALIZATION;  Surgeon: Vanderbilt Ned, MD;  Location: [facility];  Service: General;  Laterality: Left;   KNEE ARTHROSCOPY Right    lapband  2013   SENTINEL NODE BIOPSY Left 01/07/2021   Procedure: LEFT SENTINEL NODE BIOPSY;  Surgeon: Vanderbilt Ned, MD;  Location: [facility];  Service: General;  Laterality: Left;   TONSILLECTOMY  2012    I have reviewed the social history and family history with the patient and they are unchanged from previous note.  ALLERGIES:  is allergic to ciprofloxacin, keflex [cephalexin], and trazodone  and nefazodone.  MEDICATIONS:  Current Outpatient Medications  Medication Sig Dispense Refill   ALPRAZolam  (XANAX ) 0.25 MG tablet TAKE 1 TABLET BY MOUTH TWICE A DAY AS NEEDED 20 tablet 0   atorvastatin  (LIPITOR) 10 MG tablet TAKE 1 TABLET BY MOUTH EVERY DAY 90 tablet 1   buPROPion  (WELLBUTRIN  XL) 150 MG 24 hr tablet Take 1 tablet (150 mg total) by mouth daily. 90 tablet 0   butalbital -acetaminophen -caffeine  (FIORICET) 50-325-40 MG tablet TAKE 1 TABLET BY MOUTH EVERY 6 HOURS AS NEEDED FOR HEADACHE. 20 tablet 5   cyanocobalamin  (VITAMIN B12) 1000 MCG/ML injection Inject 1 mL (1,000 mcg total) into the muscle every 30 (thirty) days. 30 mL 6   cyclobenzaprine  (FLEXERIL ) 10 MG tablet TAKE 1 TABLET (10 MG TOTAL) BY MOUTH DAILY AS NEEDED (MIGRAINES). 15 tablet 5   Fezolinetant   (VEOZAH ) 45 MG TABS Take 1 tablet (45 mg total) by mouth daily. 30 tablet 1   fluticasone  (FLONASE ) 50 MCG/ACT nasal spray SPRAY 2 SPRAYS INTO EACH NOSTRIL EVERY DAY 48 mL 0   gabapentin  (NEURONTIN ) 300 MG capsule TAKE 1 CAPSULE BY MOUTH TWICE A DAY 60 capsule 1   KLOR-CON  M10 10 MEQ tablet TAKE 1 TABLET BY MOUTH TWICE A DAY 180 tablet 1   levocetirizine (XYZAL ) 5 MG tablet TAKE 1 TABLET BY MOUTH EVERY DAY IN THE EVENING 90 tablet 3   levothyroxine  (SYNTHROID ) 88 MCG tablet TAKE 1 TABLET BY MOUTH EVERY DAY 90 tablet 1   losartan  (COZAAR ) 50 MG tablet TAKE 1 TABLET BY MOUTH EVERY DAY 90 tablet 1   mupirocin  ointment (BACTROBAN ) 2 % Apply 1 Application topically 2 (two) times daily. 22 g 0   NURTEC 75 MG TBDP Take 1 tablet (75 mg total) by mouth daily as needed (migraine headache). Max 1 tablet in  24 hours. 8 tablet 2   omeprazole  (PRILOSEC) 20 MG capsule TAKE 1 CAPSULE BY MOUTH EVERY DAY 90 capsule 1   valACYclovir  (VALTREX ) 500 MG tablet TAKE 1 TABLET (500 MG TOTAL) BY MOUTH DAILY AS NEEDED (HERPES SIMPLEX). 90 tablet 1   No current facility-administered medications for this visit.   Facility-Administered Medications Ordered in Other Visits  Medication Dose Route Frequency Provider Last Rate Last Admin   cyanocobalamin  (VITAMIN B12) injection 1,000 mcg  1,000 mcg Intramuscular Once Lanny Callander, MD        PHYSICAL EXAMINATION: ECOG PERFORMANCE STATUS: 0 - Asymptomatic  Vitals:   03/09/24 1138  BP: 120/70  Pulse: 82  Resp: 15  Temp: 98.2 F (36.8 C)  SpO2: 97%   Wt Readings from Last 3 Encounters:  03/09/24 221 lb 12.8 oz (100.6 kg)  03/02/24 222 lb (100.7 kg)  02/03/24 226 lb (102.5 kg)     GENERAL:alert, no distress and comfortable SKIN: skin color, texture, turgor are normal, no rashes or significant lesions EYES: normal, Conjunctiva are pink and non-injected, sclera clear NECK: supple, thyroid  normal size, non-tender, without nodularity LYMPH:  no palpable lymphadenopathy in  the cervical, axillary  LUNGS: clear to auscultation and percussion with normal breathing effort HEART: regular rate & rhythm and no murmurs and no lower extremity edema ABDOMEN:abdomen soft, non-tender and normal bowel sounds Musculoskeletal:no cyanosis of digits and no clubbing  NEURO: alert & oriented x 3 with fluent speech, no focal motor/sensory deficits BREAST: Incision in the axillary region with mild scar tissue. Left breast mildly swollen.  No palpable breast mass or axillary adenopathy Physical Exam   LABORATORY DATA:  I have reviewed the data as listed    Latest Ref Rng & Units 03/09/2024   11:20 AM 02/03/2024    8:16 AM 09/09/2023   11:18 AM  CBC  WBC 4.0 - 10.5 K/uL 3.9  3.7  4.1   Hemoglobin 12.0 - 15.0 g/dL 87.8  87.7  87.6   Hematocrit 36.0 - 46.0 % 35.5  37.3  35.0   Platelets 150 - 400 K/uL 233  231  205         Latest Ref Rng & Units 03/09/2024   11:20 AM 02/03/2024    8:16 AM 09/09/2023   11:18 AM  CMP  Glucose 70 - 99 mg/dL 894  896  98   BUN 6 - 20 mg/dL 10  11  11    Creatinine 0.44 - 1.00 mg/dL 9.02  8.96  9.00   Sodium 135 - 145 mmol/L 139  140  140   Potassium 3.5 - 5.1 mmol/L 3.8  4.1  3.5   Chloride 98 - 111 mmol/L 104  100  104   CO2 22 - 32 mmol/L 30  33  30   Calcium  8.9 - 10.3 mg/dL 9.4  9.4  9.2   Total Protein 6.5 - 8.1 g/dL 7.0  7.1  7.5   Total Bilirubin 0.0 - 1.2 mg/dL 0.3  0.5  0.3   Alkaline Phos 38 - 126 U/L 106   111   AST 15 - 41 U/L 15  11  14    ALT 0 - 44 U/L 11  8  9        RADIOGRAPHIC STUDIES: I have personally reviewed the radiological images as listed and agreed with the findings in the report. No results found.    No orders of the defined types were placed in this encounter.  All questions were answered. The  patient knows to call the clinic with any problems, questions or concerns. No barriers to learning was detected. The total time spent in the appointment was 25 minutes, including review of chart and various tests  results, discussions about plan of care and coordination of care plan     Onita Mattock, MD 03/09/2024

## 2024-03-14 ENCOUNTER — Other Ambulatory Visit: Payer: Self-pay | Admitting: Internal Medicine

## 2024-03-16 NOTE — Telephone Encounter (Signed)
 Requested Prescriptions  Pending Prescriptions Disp Refills   valACYclovir  (VALTREX ) 500 MG tablet [Pharmacy Med Name: VALACYCLOVIR  HCL 500 MG TABLET] 90 tablet 1    Sig: TAKE 1 TABLET (500 MG TOTAL) BY MOUTH DAILY AS NEEDED (HERPES SIMPLEX).     Antimicrobials:  Antiviral Agents - Anti-Herpetic Passed - 03/16/2024 10:38 AM      Passed - Valid encounter within last 12 months    Recent Outpatient Visits           1 month ago Pure hypercholesterolemia   [place] [facility] [place], Angeline ORN, NP   4 months ago Inattention   [place] [facility] [place], Angeline ORN, [place]

## 2024-03-21 ENCOUNTER — Encounter: Payer: Self-pay | Admitting: Internal Medicine

## 2024-04-04 ENCOUNTER — Other Ambulatory Visit: Payer: Self-pay | Admitting: Nurse Practitioner

## 2024-04-04 ENCOUNTER — Encounter: Payer: Self-pay | Admitting: Nurse Practitioner

## 2024-04-04 ENCOUNTER — Other Ambulatory Visit: Payer: Self-pay

## 2024-04-06 ENCOUNTER — Other Ambulatory Visit: Payer: Self-pay | Admitting: Nurse Practitioner

## 2024-04-06 MED ORDER — VENLAFAXINE HCL ER 37.5 MG PO CP24: 37.5000 mg | ORAL_CAPSULE | Freq: Every day | ORAL | 1 refills | Status: DC

## 2024-04-09 ENCOUNTER — Other Ambulatory Visit: Payer: Self-pay | Admitting: Internal Medicine

## 2024-04-11 NOTE — Telephone Encounter (Signed)
 Requested Prescriptions  Pending Prescriptions Disp Refills   buPROPion  (WELLBUTRIN  XL) 150 MG 24 hr tablet [Pharmacy Med Name: BUPROPION  HCL XL 150 MG TABLET] 90 tablet 1    Sig: TAKE 1 TABLET BY MOUTH EVERY DAY     Psychiatry: Antidepressants - bupropion  Passed - 04/11/2024  9:12 AM      Passed - Cr in normal range and within 360 days    Creatinine  Date Value Ref Range Status  03/09/2024 0.97 0.44 - 1.00 mg/dL Final   Creat  Date Value Ref Range Status  02/03/2024 1.03 0.50 - 1.03 mg/dL Final         Passed - AST in normal range and within 360 days    AST  Date Value Ref Range Status  03/09/2024 15 15 - 41 U/L Final         Passed - ALT in normal range and within 360 days    ALT  Date Value Ref Range Status  03/09/2024 11 0 - 44 U/L Final         Passed - Last BP in normal range    BP Readings from Last 1 Encounters:  03/09/24 120/70         Passed - Valid encounter within last 6 months    Recent Outpatient Visits           2 months ago Pure hypercholesterolemia   [place] [facility] [place], Angeline ORN, NP   4 months ago Inattention   [place] [facility] [place], Angeline ORN, [place]

## 2024-04-13 ENCOUNTER — Encounter: Payer: Self-pay | Admitting: Internal Medicine

## 2024-04-13 DIAGNOSIS — F909 Attention-deficit hyperactivity disorder, unspecified type: Secondary | ICD-10-CM | POA: Insufficient documentation

## 2024-04-20 ENCOUNTER — Other Ambulatory Visit: Payer: Self-pay | Admitting: Internal Medicine

## 2024-04-24 NOTE — Telephone Encounter (Signed)
 I did. Ok to schedule virtual visit

## 2024-04-24 NOTE — Telephone Encounter (Signed)
 Requested Prescriptions  Pending Prescriptions Disp Refills   levothyroxine  (SYNTHROID ) 88 MCG tablet [Pharmacy Med Name: LEVOTHYROXINE  88 MCG TABLET] 90 tablet 0    Sig: TAKE 1 TABLET BY MOUTH EVERY DAY     Endocrinology:  Hypothyroid Agents Passed - 04/24/2024  8:54 AM      Passed - TSH in normal range and within 360 days    TSH  Date Value Ref Range Status  02/03/2024 1.39 mIU/L Final    Comment:              Reference Range .           > or = 20 Years  0.40-4.50 .                Pregnancy Ranges           First trimester    0.26-2.66           Second trimester   0.55-2.73           Third trimester    0.43-2.91          Passed - Valid encounter within last 12 months    Recent Outpatient Visits           2 months ago Pure hypercholesterolemia   [facility] [place], Angeline ORN, NP   5 months ago Inattention   [facility] [place], Angeline ORN, [place]

## 2024-04-27 ENCOUNTER — Telehealth: Admitting: Internal Medicine

## 2024-04-27 ENCOUNTER — Encounter: Payer: Self-pay | Admitting: Internal Medicine

## 2024-04-27 DIAGNOSIS — F901 Attention-deficit hyperactivity disorder, predominantly hyperactive type: Secondary | ICD-10-CM

## 2024-04-27 MED ORDER — ATOMOXETINE HCL 18 MG PO CAPS: 18.0000 mg | ORAL_CAPSULE | Freq: Every day | ORAL | 0 refills | Status: DC

## 2024-04-27 NOTE — Progress Notes (Signed)
 Virtual Visit via Video Note  I connected with [name] on 04/27/24 at 11:20 AM EDT by a video enabled telemedicine application and verified that I am speaking with the correct person using two identifiers.  Location: Patient: Home Provider: Office  Person's participating in this video call: Angeline Laura, NP-C and Alexias Margerum   I discussed the limitations of evaluation and management by telemedicine and the availability of in person appointments. The patient expressed understanding and agreed to proceed.  History of Present Illness:  Discussed the use of AI scribe software for clinical note transcription with the patient, who gave verbal consent to proceed.  [name] is a 53 year old female with ADHD who presents for follow-up on ADHD management.  She was diagnosed with ADHD, combined type, with impulsivity being a significant issue, while inattention was not considered problematic. Due to her history of supraventricular tachycardia (SVT) and previous use of flecainide , stimulants were initially avoided. Her cardiologist has since discontinued flecainide . She reports experiencing short bursts of rapid heartbeats and premature ventricular contractions (PVCs).  She has not started the prescribed intuniv due to concerns about significant drowsiness, which was inconvenient during her recent move. She has not been on any medication for ADHD previously, although she acknowledges worsening symptoms, possibly due to hormonal changes associated with perimenopause. She describes her condition as 'kind of manic,' with periods of high activity followed by 'crash and burn' days.  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   She also inquired about weight loss medication due to difficulty losing weight.  Her weight today is 221 LBS with a BMI of 36.9.  She has no history of diabetes, sleep apnea cardiovascular  issues.     Past Medical History:  Diagnosis Date   Allergy     Anemia    present for many years, mild, stable   Anxiety    Breast cancer (HCC)    Breast cancer (HCC) 12/18/2020   Dysrhythmia    PAC's on flecanide (did not tolerate beta blockers)   Family history of breast cancer 01/20/2021   Family history of prostate cancer 01/20/2021   Frequent headaches    HSV infection    fever blisters   Hypertension    Hypothyroidism    Personal history of radiation therapy     Current Outpatient Medications  Medication Sig Dispense Refill   ALPRAZolam  (XANAX ) 0.25 MG tablet TAKE 1 TABLET BY MOUTH TWICE A DAY AS NEEDED 20 tablet 0   atorvastatin  (LIPITOR) 10 MG tablet TAKE 1 TABLET BY MOUTH EVERY DAY 90 tablet 1   buPROPion  (WELLBUTRIN  XL) 150 MG 24 hr tablet TAKE 1 TABLET BY MOUTH EVERY DAY 90 tablet 1   butalbital -acetaminophen -caffeine  (FIORICET) 50-325-40 MG tablet TAKE 1 TABLET BY MOUTH EVERY 6 HOURS AS NEEDED FOR HEADACHE. 20 tablet 5   cyanocobalamin  (VITAMIN B12) 1000 MCG/ML injection Inject 1 mL (1,000 mcg total) into the muscle every 30 (thirty) days. 30 mL 6   cyclobenzaprine  (FLEXERIL ) 10 MG tablet TAKE 1 TABLET (10 MG TOTAL) BY MOUTH DAILY AS NEEDED (MIGRAINES). 15 tablet 5   Fezolinetant  (VEOZAH ) 45 MG TABS Take 1 tablet (45 mg total) by mouth daily. 30 tablet 1   fluticasone  (FLONASE ) 50 MCG/ACT nasal spray SPRAY 2 SPRAYS INTO EACH NOSTRIL EVERY DAY 48 mL 0   gabapentin  (NEURONTIN ) 300 MG capsule TAKE 1 CAPSULE BY MOUTH TWICE A DAY 60 capsule 1  KLOR-CON  M10 10 MEQ tablet TAKE 1 TABLET BY MOUTH TWICE A DAY 180 tablet 1   levocetirizine (XYZAL ) 5 MG tablet TAKE 1 TABLET BY MOUTH EVERY DAY IN THE EVENING 90 tablet 3   levothyroxine  (SYNTHROID ) 88 MCG tablet TAKE 1 TABLET BY MOUTH EVERY DAY 90 tablet 0   losartan  (COZAAR ) 50 MG tablet TAKE 1 TABLET BY MOUTH EVERY DAY 90 tablet 0   mupirocin  ointment (BACTROBAN ) 2 % Apply 1 Application topically 2 (two) times daily. 22 g 0    NURTEC 75 MG TBDP Take 1 tablet (75 mg total) by mouth daily as needed (migraine headache). Max 1 tablet in 24 hours. 8 tablet 2   omeprazole  (PRILOSEC) 20 MG capsule TAKE 1 CAPSULE BY MOUTH EVERY DAY 90 capsule 1   valACYclovir  (VALTREX ) 500 MG tablet TAKE 1 TABLET (500 MG TOTAL) BY MOUTH DAILY AS NEEDED (HERPES SIMPLEX). 90 tablet 1   venlafaxine  XR (EFFEXOR -XR) 37.5 MG 24 hr capsule Take 1 capsule (37.5 mg total) by mouth daily with breakfast. 90 capsule 1   No current facility-administered medications for this visit.   Facility-Administered Medications Ordered in Other Visits  Medication Dose Route Frequency Provider Last Rate Last Admin   cyanocobalamin  (VITAMIN B12) injection 1,000 mcg  1,000 mcg Intramuscular Once Lanny Callander, MD        Allergies  Allergen Reactions   Ciprofloxacin     anxiety   Keflex [Cephalexin] Hives   Trazodone  And Nefazodone Other (See Comments)    nightmares    Family History  Problem Relation Age of Onset   Depression Mother    Breast cancer Mother 11   Hyperlipidemia Father    Prostate cancer Father 19    Social History   Socioeconomic History   Marital status: Single    Spouse name: Not on file   Number of children: 1   Years of education: Not on file   Highest education level: Bachelor's degree (e.g., BA, AB, BS)  Occupational History   Not on file  Tobacco Use   Smoking status: Never   Smokeless tobacco: Never  Vaping Use   Vaping status: Never Used  Substance and Sexual Activity   Alcohol use: Yes    Comment: occasional   Drug use: No   Sexual activity: Yes    Partners: Male    Birth control/protection: Pill  Other Topics Concern   Not on file  Social History Narrative   Not on file   Social Drivers of Health   Financial Resource Strain: Low Risk  (04/26/2024)   Overall Financial Resource Strain (CARDIA)    Difficulty of Paying Living Expenses: Not very hard  Food Insecurity: No Food Insecurity (04/26/2024)   Hunger Vital  Sign    Worried About Running Out of Food in the Last Year: Never true    Ran Out of Food in the Last Year: Never true  Transportation Needs: No Transportation Needs (04/26/2024)   PRAPARE - Administrator, Civil Service (Medical): No    Lack of Transportation (Non-Medical): No  Physical Activity: Insufficiently Active (04/26/2024)   Exercise Vital Sign    Days of Exercise per Week: 2 days    Minutes of Exercise per Session: 30 min  Stress: No Stress Concern Present (04/26/2024)   [employer] of Occupational Health - Occupational Stress Questionnaire    Feeling of Stress: Only a little  Social Connections: Moderately Isolated (04/26/2024)   Social Connection and Isolation Panel  Frequency of Communication with Friends and Family: More than three times a week    Frequency of Social Gatherings with Friends and Family: Once a week    Attends Religious Services: Never    Database administrator or Organizations: Yes    Attends Banker Meetings: 1 to 4 times per year    Marital Status: Never married  Intimate Partner Violence: Not At Risk (01/02/2021)   Humiliation, Afraid, Rape, and Kick questionnaire    Fear of Current or Ex-Partner: No    Emotionally Abused: No    Physically Abused: No    Sexually Abused: No     Constitutional: Patient reports intermittent headaches, difficulty losing weight.  Denies fever, malaise, fatigue, or abrupt weight changes.  Respiratory: Denies difficulty breathing, shortness of breath, cough or sputum production.   Cardiovascular: Denies chest pain, chest tightness, palpitations or swelling in the hands or feet.  Gastrointestinal: Denies abdominal pain, bloating, constipation, diarrhea or blood in the stool.  GU: Denies urgency, frequency, pain with urination, burning sensation, blood in urine, odor or discharge. Musculoskeletal: Denies decrease in range of motion, difficulty with gait, muscle pain or joint pain and swelling.   Skin: Denies redness, rashes, lesions or ulcercations.  Neurological: Patient reports impulsivity, difficulty completing tasks. denies dizziness, difficulty with memory, difficulty with speech or problems with balance and coordination.  Psych: Patient has a history of anxiety.  Denies depression, SI/HI.  No other specific complaints in a complete review of systems (except as listed in HPI above).  Observations/Objective:  Wt Readings from Last 3 Encounters:  03/09/24 221 lb 12.8 oz (100.6 kg)  03/02/24 222 lb (100.7 kg)  02/03/24 226 lb (102.5 kg)    General: Appears her stated age, obese, in NAD. Pulmonary/Chest: Normal effort. No respiratory distress.  Neurological: Alert and oriented. Coordination normal.  Psychiatric: Mood and affect normal. Behavior is normal. Judgment and thought content normal.     BMET    Component Value Date/Time   NA 139 03/09/2024 1120   NA 143 01/26/2020 1600   K 3.8 03/09/2024 1120   CL 104 03/09/2024 1120   CO2 30 03/09/2024 1120   GLUCOSE 105 (H) 03/09/2024 1120   BUN 10 03/09/2024 1120   BUN 11 01/26/2020 1600   CREATININE 0.97 03/09/2024 1120   CREATININE 1.03 02/03/2024 0816   CALCIUM  9.4 03/09/2024 1120   GFRNONAA >60 03/09/2024 1120   GFRAA >60 02/25/2020 2231    Lipid Panel     Component Value Date/Time   CHOL 261 (H) 02/03/2024 0816   TRIG 131 02/03/2024 0816   HDL 48 (L) 02/03/2024 0816   CHOLHDL 5.4 (H) 02/03/2024 0816   VLDL 18.8 12/21/2019 1204   LDLCALC 186 (H) 02/03/2024 0816    CBC    Component Value Date/Time   WBC 3.9 (L) 03/09/2024 1120   WBC 3.7 (L) 02/03/2024 0816   RBC 4.14 03/09/2024 1120   HGB 12.1 03/09/2024 1120   HGB 10.1 (L) 04/25/2020 1154   HCT 35.5 (L) 03/09/2024 1120   HCT 31.6 (L) 04/25/2020 1154   PLT 233 03/09/2024 1120   PLT 261 04/25/2020 1154   MCV 85.7 03/09/2024 1120   MCV 80 04/25/2020 1154   MCH 29.2 03/09/2024 1120   MCHC 34.1 03/09/2024 1120   RDW 13.6 03/09/2024 1120   RDW  16.8 (H) 04/25/2020 1154   LYMPHSABS 1.2 03/09/2024 1120   LYMPHSABS 1.4 04/25/2020 1154   MONOABS 0.2 03/09/2024 1120   EOSABS  0.1 03/09/2024 1120   EOSABS 0.0 04/25/2020 1154   BASOSABS 0.1 03/09/2024 1120   BASOSABS 0.1 04/25/2020 1154    Hgb A1C Lab Results  Component Value Date   HGBA1C 5.6 02/03/2024       Assessment and Plan:  Assessment and Plan    Attention-deficit hyperactivity disorder, combined type ADHD with significant impulsivity. Strattera  chosen due to non-interaction with current medications and expected energy benefits. -Would still want to avoid stimulants given her history of SVT - Prescribe Strattera  18 mg once daily. - Monitor response to Strattera  and adjust dosage as needed. - Follow up in one month if issues arise or communicate via MyChart.    RTC in 3 months for annual exam  Follow Up Instructions:    I discussed the assessment and treatment plan with the patient. The patient was provided an opportunity to ask questions and all were answered. The patient agreed with the plan and demonstrated an understanding of the instructions.   The patient was advised to call back or seek an in-person evaluation if the symptoms worsen or if the condition fails to improve as anticipated.   Angeline Laura, NP

## 2024-04-27 NOTE — Assessment & Plan Note (Signed)
 Insurance will not cover wegovy or zepbound Encouraged low-carb diet and exercise for weight loss

## 2024-04-27 NOTE — Patient Instructions (Signed)

## 2024-05-01 ENCOUNTER — Telehealth: Payer: Self-pay

## 2024-05-01 ENCOUNTER — Other Ambulatory Visit (HOSPITAL_COMMUNITY): Payer: Self-pay

## 2024-05-01 ENCOUNTER — Encounter: Payer: Self-pay | Admitting: Hematology

## 2024-05-01 ENCOUNTER — Encounter: Payer: Self-pay | Admitting: Internal Medicine

## 2024-05-01 NOTE — Telephone Encounter (Signed)
 Pharmacy Patient Advocate Encounter  Received notification from [facility] that Prior Authorization for Atomoxetine  HCl 18MG  capsules  has been APPROVED from 05/01/24 to 05/02/27. Ran test claim, Copay is $5. This test claim was processed through [facility]- copay amounts may vary at other pharmacies due to pharmacy/plan contracts, or as the patient moves through the different stages of their insurance plan.   PA #/Case ID/Reference #: [id number]

## 2024-05-01 NOTE — Telephone Encounter (Signed)
 Pharmacy Patient Advocate Encounter   Received notification from Onbase that prior authorization for Atomoxetine  HCl 18MG  capsules  is required/requested.   Insurance verification completed.   The patient is insured through [facility] .   Per test claim: PA required; PA submitted to above mentioned insurance via Latent Key/confirmation #/EOC [id number] Status is pending

## 2024-05-02 ENCOUNTER — Other Ambulatory Visit (HOSPITAL_COMMUNITY): Payer: Self-pay

## 2024-05-02 NOTE — Telephone Encounter (Signed)
 PA has been approved

## 2024-05-03 ENCOUNTER — Encounter: Payer: Self-pay | Admitting: Internal Medicine

## 2024-05-04 ENCOUNTER — Encounter: Payer: Self-pay | Admitting: Internal Medicine

## 2024-05-04 ENCOUNTER — Ambulatory Visit: Admitting: Internal Medicine

## 2024-05-04 VITALS — BP 124/82 | Ht 65.0 in | Wt 225.4 lb

## 2024-05-04 DIAGNOSIS — G43909 Migraine, unspecified, not intractable, without status migrainosus: Secondary | ICD-10-CM

## 2024-05-04 MED ORDER — KETOROLAC TROMETHAMINE 30 MG/ML IJ SOLN
30.0000 mg | Freq: Once | INTRAMUSCULAR | Status: AC
  Administered 2024-05-04: 30 mg via INTRAMUSCULAR

## 2024-05-04 NOTE — Patient Instructions (Signed)

## 2024-05-04 NOTE — Progress Notes (Signed)
 Subjective:    Patient ID: [name], female    DOB: Jan 28, 1971, 53 y.o.   MRN: [id number]  HPI  Discussed the use of AI scribe software for clinical note transcription with the patient, who gave verbal consent to proceed.  [name] is a 53 year old female who presents with an acute migraine.  She has been experiencing an acute migraine since Sunday, lasting for four days, with the headache extending from the front to the back of her head. She notes sensitivity to light, and experiences mild nausea but no dizziness.  She has tried several medications including nurtec, fioricet for breakthrough pain, but none have provided relief. Her symptoms seem to improve in the evenings once she goes to sleep, but they return upon waking.  She recalls a similar episode around the same time last year, shortly after school started, and does feel an increase in stress currently. She denies having an aura with her migraines.        Review of Systems     Past Medical History:  Diagnosis Date   Allergy     Anemia    present for many years, mild, stable   Anxiety    Breast cancer (HCC)    Breast cancer (HCC) 12/18/2020   Dysrhythmia    PAC's on flecanide (did not tolerate beta blockers)   Family history of breast cancer 01/20/2021   Family history of prostate cancer 01/20/2021   Frequent headaches    HSV infection    fever blisters   Hypertension    Hypothyroidism    Personal history of radiation therapy     Current Outpatient Medications  Medication Sig Dispense Refill   ALPRAZolam  (XANAX ) 0.25 MG tablet TAKE 1 TABLET BY MOUTH TWICE A DAY AS NEEDED 20 tablet 0   atomoxetine  (STRATTERA ) 18 MG capsule Take 1 capsule (18 mg total) by mouth daily. 30 capsule 0   atorvastatin  (LIPITOR) 10 MG tablet TAKE 1 TABLET BY MOUTH EVERY DAY 90 tablet 1   butalbital -acetaminophen -caffeine  (FIORICET) 50-325-40 MG tablet TAKE 1 TABLET BY MOUTH EVERY 6 HOURS AS NEEDED FOR HEADACHE. 20  tablet 5   cyanocobalamin  (VITAMIN B12) 1000 MCG/ML injection Inject 1 mL (1,000 mcg total) into the muscle every 30 (thirty) days. 30 mL 6   cyclobenzaprine  (FLEXERIL ) 10 MG tablet TAKE 1 TABLET (10 MG TOTAL) BY MOUTH DAILY AS NEEDED (MIGRAINES). 15 tablet 5   Fezolinetant  (VEOZAH ) 45 MG TABS Take 1 tablet (45 mg total) by mouth daily. 30 tablet 1   gabapentin  (NEURONTIN ) 300 MG capsule TAKE 1 CAPSULE BY MOUTH TWICE A DAY 60 capsule 1   KLOR-CON  M10 10 MEQ tablet TAKE 1 TABLET BY MOUTH TWICE A DAY 180 tablet 1   levocetirizine (XYZAL ) 5 MG tablet TAKE 1 TABLET BY MOUTH EVERY DAY IN THE EVENING 90 tablet 3   levothyroxine  (SYNTHROID ) 88 MCG tablet TAKE 1 TABLET BY MOUTH EVERY DAY 90 tablet 0   losartan  (COZAAR ) 50 MG tablet TAKE 1 TABLET BY MOUTH EVERY DAY 90 tablet 0   mupirocin  ointment (BACTROBAN ) 2 % Apply 1 Application topically 2 (two) times daily. 22 g 0   NURTEC 75 MG TBDP Take 1 tablet (75 mg total) by mouth daily as needed (migraine headache). Max 1 tablet in 24 hours. 8 tablet 2   omeprazole  (PRILOSEC) 20 MG capsule TAKE 1 CAPSULE BY MOUTH EVERY DAY 90 capsule 1   valACYclovir  (VALTREX ) 500 MG tablet TAKE 1 TABLET (500 MG TOTAL)  BY MOUTH DAILY AS NEEDED (HERPES SIMPLEX). 90 tablet 1   venlafaxine  XR (EFFEXOR -XR) 37.5 MG 24 hr capsule Take 1 capsule (37.5 mg total) by mouth daily with breakfast. 90 capsule 1   No current facility-administered medications for this visit.   Facility-Administered Medications Ordered in Other Visits  Medication Dose Route Frequency Provider Last Rate Last Admin   cyanocobalamin  (VITAMIN B12) injection 1,000 mcg  1,000 mcg Intramuscular Once Lanny Callander, MD        Allergies  Allergen Reactions   Ciprofloxacin     anxiety   Keflex [Cephalexin] Hives   Trazodone  And Nefazodone Other (See Comments)    nightmares    Family History  Problem Relation Age of Onset   Depression Mother    Breast cancer Mother 26   Hyperlipidemia Father    Prostate  cancer Father 96    Social History   Socioeconomic History   Marital status: Single    Spouse name: Not on file   Number of children: 1   Years of education: Not on file   Highest education level: Bachelor's degree (e.g., BA, AB, BS)  Occupational History   Not on file  Tobacco Use   Smoking status: Never   Smokeless tobacco: Never  Vaping Use   Vaping status: Never Used  Substance and Sexual Activity   Alcohol use: Yes    Comment: occasional   Drug use: No   Sexual activity: Yes    Partners: Male    Birth control/protection: Pill  Other Topics Concern   Not on file  Social History Narrative   Not on file   Social Drivers of Health   Financial Resource Strain: Low Risk  (04/26/2024)   Overall Financial Resource Strain (CARDIA)    Difficulty of Paying Living Expenses: Not very hard  Food Insecurity: No Food Insecurity (04/26/2024)   Hunger Vital Sign    Worried About Running Out of Food in the Last Year: Never true    Ran Out of Food in the Last Year: Never true  Transportation Needs: No Transportation Needs (04/26/2024)   PRAPARE - Administrator, Civil Service (Medical): No    Lack of Transportation (Non-Medical): No  Physical Activity: Insufficiently Active (04/26/2024)   Exercise Vital Sign    Days of Exercise per Week: 2 days    Minutes of Exercise per Session: 30 min  Stress: No Stress Concern Present (04/26/2024)   [employer] of Occupational Health - Occupational Stress Questionnaire    Feeling of Stress: Only a little  Social Connections: Moderately Isolated (04/26/2024)   Social Connection and Isolation Panel    Frequency of Communication with Friends and Family: More than three times a week    Frequency of Social Gatherings with Friends and Family: Once a week    Attends Religious Services: Never    Database administrator or Organizations: Yes    Attends Banker Meetings: 1 to 4 times per year    Marital Status: Never married   Intimate Partner Violence: Not At Risk (01/02/2021)   Humiliation, Afraid, Rape, and Kick questionnaire    Fear of Current or Ex-Partner: No    Emotionally Abused: No    Physically Abused: No    Sexually Abused: No     Constitutional: Patient reports headache.  Denies fever, malaise, fatigue, or abrupt weight changes.  HEENT: Denies eye pain, eye redness, ear pain, ringing in the ears, wax buildup, runny nose, nasal congestion, bloody nose,  or sore throat. Respiratory: Denies difficulty breathing, shortness of breath, cough or sputum production.   Cardiovascular: Denies chest pain, chest tightness, palpitations or swelling in the hands or feet.  Gastrointestinal: Patient reports nausea.  Denies abdominal pain, bloating, constipation, diarrhea or blood in the stool.  Musculoskeletal: Denies decrease in range of motion, difficulty with gait, muscle pain or joint pain and swelling.  Skin: Denies redness, rashes, lesions or ulcercations.  Neurological: Patient reports sensitivity to light.  Denies dizziness difficulty with memory, difficulty with speech or problems with balance and coordination.    No other specific complaints in a complete review of systems (except as listed in HPI above).  Objective:   Physical Exam   BP 124/82 (BP Location: Right Arm, Patient Position: Sitting, Cuff Size: Normal)   Ht 5' 5 (1.651 m)   Wt 225 lb 6.4 oz (102.2 kg)   BMI 37.51 kg/m    Wt Readings from Last 3 Encounters:  03/09/24 221 lb 12.8 oz (100.6 kg)  03/02/24 222 lb (100.7 kg)  02/03/24 226 lb (102.5 kg)    General: Appears her stated age, obese, in NAD. HEENT: Eyes: EOMs intact;  Cardiovascular: Normal rate. Pulmonary/Chest: Normal effort and positive vesicular breath sounds.  Neurological: Alert and oriented.  Coordination normal.    BMET    Component Value Date/Time   NA 139 03/09/2024 1120   NA 143 01/26/2020 1600   K 3.8 03/09/2024 1120   CL 104 03/09/2024 1120   CO2 30  03/09/2024 1120   GLUCOSE 105 (H) 03/09/2024 1120   BUN 10 03/09/2024 1120   BUN 11 01/26/2020 1600   CREATININE 0.97 03/09/2024 1120   CREATININE 1.03 02/03/2024 0816   CALCIUM  9.4 03/09/2024 1120   GFRNONAA >60 03/09/2024 1120   GFRAA >60 02/25/2020 2231    Lipid Panel     Component Value Date/Time   CHOL 261 (H) 02/03/2024 0816   TRIG 131 02/03/2024 0816   HDL 48 (L) 02/03/2024 0816   CHOLHDL 5.4 (H) 02/03/2024 0816   VLDL 18.8 12/21/2019 1204   LDLCALC 186 (H) 02/03/2024 0816    CBC    Component Value Date/Time   WBC 3.9 (L) 03/09/2024 1120   WBC 3.7 (L) 02/03/2024 0816   RBC 4.14 03/09/2024 1120   HGB 12.1 03/09/2024 1120   HGB 10.1 (L) 04/25/2020 1154   HCT 35.5 (L) 03/09/2024 1120   HCT 31.6 (L) 04/25/2020 1154   PLT 233 03/09/2024 1120   PLT 261 04/25/2020 1154   MCV 85.7 03/09/2024 1120   MCV 80 04/25/2020 1154   MCH 29.2 03/09/2024 1120   MCHC 34.1 03/09/2024 1120   RDW 13.6 03/09/2024 1120   RDW 16.8 (H) 04/25/2020 1154   LYMPHSABS 1.2 03/09/2024 1120   LYMPHSABS 1.4 04/25/2020 1154   MONOABS 0.2 03/09/2024 1120   EOSABS 0.1 03/09/2024 1120   EOSABS 0.0 04/25/2020 1154   BASOSABS 0.1 03/09/2024 1120   BASOSABS 0.1 04/25/2020 1154    Hgb A1C Lab Results  Component Value Date   HGBA1C 5.6 02/03/2024           Assessment & Plan:   Assessment and Plan    Acute migraine without aura Migraine persistent for four days with photophobia, sensitivity to walking, and mild nausea. Previous medications ineffective. Neurology appointment scheduled. Discussed injectable treatments Ajovy or Emgality, noting insurance challenges. - Administered 30 mg Toradol  injection. - Provided Zavzpret samples for trial. - Discuss injectable treatments Ajovy or Emgality with neurologist.    -  Follow-up with neurology  RTC in 3 month for your annual exam Angeline Laura, NP

## 2024-05-05 ENCOUNTER — Other Ambulatory Visit: Payer: Self-pay | Admitting: Nurse Practitioner

## 2024-05-17 ENCOUNTER — Encounter: Payer: Self-pay | Admitting: Neurology

## 2024-05-17 ENCOUNTER — Ambulatory Visit: Payer: Self-pay | Admitting: Neurology

## 2024-05-17 VITALS — BP 124/75 | HR 79 | Ht 65.5 in | Wt 223.6 lb

## 2024-05-17 DIAGNOSIS — G43709 Chronic migraine without aura, not intractable, without status migrainosus: Secondary | ICD-10-CM | POA: Insufficient documentation

## 2024-05-17 MED ORDER — ONDANSETRON 8 MG PO TBDP: 8.0000 mg | ORAL_TABLET | Freq: Three times a day (TID) | ORAL | 11 refills | Status: AC | PRN

## 2024-05-17 MED ORDER — EMGALITY 120 MG/ML ~~LOC~~ SOAJ: 120.0000 mg | SUBCUTANEOUS | 0 refills | Status: DC

## 2024-05-17 MED ORDER — EMGALITY 120 MG/ML ~~LOC~~ SOAJ: 120.0000 mg | SUBCUTANEOUS | 11 refills | Status: AC

## 2024-05-17 MED ORDER — UBRELVY 100 MG PO TABS: 100.0000 mg | ORAL_TABLET | ORAL | Status: AC | PRN

## 2024-05-17 NOTE — Progress Notes (Signed)
 [facility]    Provider:  Dr Ines Requesting Provider: Antonette Angeline ORN, NP Primary Care Provider:  Antonette Angeline ORN, NP  CC:  Chronic Migraines  HPI:  [name] is a 53 y.o. female here as requested by Antonette Angeline ORN, NP for migraines. has Chronic headaches; Hypothyroidism; GAD (generalized anxiety disorder); HLD (hyperlipidemia); HSV-2 (herpes simplex virus 2) infection; History of left breast cancer; Iron  deficiency anemia; B12 deficiency anemia; Prediabetes; Class 2 obesity due to excess calories with body mass index (BMI) of 37.0 to 37.9 in adult; Gastroesophageal reflux disease without esophagitis; HTN (hypertension); ADHD; and Chronic migraine without aura without status migrainosus, not intractable on their problem list.  She has had migraines for 30 years. Worse in the past 13 years. Tried multiple medications. Pulsating/pounding/throbbing, light and sound sensitivity, nausea, vomiting, hurts to move, migraines are moderate to severe and can last 8-24 hours or more, unilateral but can spread to be holocephalic. They are significantly affecting quality of life with work, family. She takes fioricet/fiorinal. Smells can trigger. She has been to neurology in the past, Dr. Skeet. They can start in the front or back of the head, the ones in the back of the head are worse, fluorescent lights are the worst and can trigger, stress, weather, sleep issues can trigger, she does not sleep well she has ADHD recently diagnosed on Stratterra, no hx of sleep apnea, no significant snoring or symptoms, no known family history. Has had multiple imaging in the past, last 2023 CT head nothing acute, no new chages in freq/quality. She has >15 headache days a month, >8 migraine days a month for > 3 months.    Reviewed notes, labs and imaging from outside physicians, which showed:  03/09/2024: cbc, cmp unbremarkable, tsh 01/25/2024 nml  From a thorough review of records and patient  report, Medications tried that can be used in migraine/headache management greater than 3 months include: Lifestyle modification, headache diaries, better sleep hygiene, exercise, management of migraine triggers, OTC and prescribed analgesics/nsaids such as ibuprofen , excedrin, alleve and others, Aimovig contraindicated due to constipation, fioricet, flexeril , relpax, prozac , gabapentin , losartan , metoprolol , nortriptyline , nurtec, zofran , maxalt , imitrex , zoloft , topamax, trazodone , effexor , verepamil, topamax   06/26/2022:  CT HEAD WITHOUT CONTRAST reviewed imaging, CT head(Ct showed No acute intracranial abnormalities including mass lesion or mass effect, hydrocephalus, extra-axial fluid collection, midline shift, hemorrhage, or acute infarction, large ischemic events (personally reviewed images)     CT CERVICAL SPINE WITHOUT CONTRAST   TECHNIQUE: Multidetector CT imaging of the head and cervical spine was performed following the standard protocol without intravenous contrast. Multiplanar CT image reconstructions of the cervical spine were also generated.   RADIATION DOSE REDUCTION: This exam was performed according to the departmental dose-optimization program which includes automated exposure control, adjustment of the mA and/or kV according to patient size and/or use of iterative reconstruction technique.   COMPARISON:  CT brain 10/02/2015   FINDINGS: CT HEAD FINDINGS   Brain: No evidence of acute infarction, hemorrhage, hydrocephalus, extra-axial collection or mass lesion/mass effect.   Vascular: No hyperdense vessel or unexpected calcification.   Skull: Normal. Negative for fracture or focal lesion.   Sinuses/Orbits: No acute finding.   Other: None   CT CERVICAL SPINE FINDINGS   Alignment: Straightening of the cervical spine. No subluxation. Facet alignment within normal limits   Skull base and vertebrae: No acute fracture. No primary bone lesion or focal  pathologic process.   Soft tissues and spinal canal: No prevertebral fluid  or swelling. No visible canal hematoma.   Disc levels: Mild multilevel degenerative change with mild disc space narrowing and osteophyte.   Upper chest: Negative.   Other: None   IMPRESSION: 1. Negative non contrasted CT appearance of the brain 2. Straightening of the cervical spine with mild degenerative change. No acute osseous abnormality  03/09/2024: cbc /cmp unremarkable, 02/03/2024 hgba1c 5.6 nml  Review of Systems: Patient complains of symptoms per HPI as well as the following symptoms per hpi. Pertinent negatives and positives per HPI. All others negative.   Social History   Socioeconomic History   Marital status: Single    Spouse name: Not on file   Number of children: 1   Years of education: Not on file   Highest education level: Bachelor's degree (e.g., BA, AB, BS)  Occupational History   Not on file  Tobacco Use   Smoking status: Never   Smokeless tobacco: Never  Vaping Use   Vaping status: Never Used  Substance and Sexual Activity   Alcohol use: Yes    Comment: occasional   Drug use: No   Sexual activity: Yes    Partners: Male    Birth control/protection: Pill  Other Topics Concern   Not on file  Social History Narrative   Not on file   Social Drivers of Health   Financial Resource Strain: Low Risk  (04/26/2024)   Overall Financial Resource Strain (CARDIA)    Difficulty of Paying Living Expenses: Not very hard  Food Insecurity: No Food Insecurity (04/26/2024)   Hunger Vital Sign    Worried About Running Out of Food in the Last Year: Never true    Ran Out of Food in the Last Year: Never true  Transportation Needs: No Transportation Needs (04/26/2024)   PRAPARE - Administrator, Civil Service (Medical): No    Lack of Transportation (Non-Medical): No  Physical Activity: Insufficiently Active (04/26/2024)   Exercise Vital Sign    Days of Exercise per Week: 2 days     Minutes of Exercise per Session: 30 min  Stress: No Stress Concern Present (04/26/2024)   [employer] of Occupational Health - Occupational Stress Questionnaire    Feeling of Stress: Only a little  Social Connections: Moderately Isolated (04/26/2024)   Social Connection and Isolation Panel    Frequency of Communication with Friends and Family: More than three times a week    Frequency of Social Gatherings with Friends and Family: Once a week    Attends Religious Services: Never    Database administrator or Organizations: Yes    Attends Banker Meetings: 1 to 4 times per year    Marital Status: Never married  Intimate Partner Violence: Not At Risk (01/02/2021)   Humiliation, Afraid, Rape, and Kick questionnaire    Fear of Current or Ex-Partner: No    Emotionally Abused: No    Physically Abused: No    Sexually Abused: No    Family History  Problem Relation Age of Onset   Depression Mother    Breast cancer Mother 53   Hyperlipidemia Father    Prostate cancer Father 21    Past Medical History:  Diagnosis Date   ADHD    June 2024   Allergy     Anemia    present for many years, mild, stable   Anxiety    Breast cancer (HCC)    Breast cancer (HCC) 12/18/2020   Dysrhythmia    PAC's on flecanide (did not tolerate  beta blockers)   Family history of breast cancer 01/20/2021   Family history of prostate cancer 01/20/2021   Frequent headaches    HSV infection    fever blisters   Hypertension    Hypothyroidism    Personal history of radiation therapy     Patient Active Problem List   Diagnosis Date Noted   Chronic migraine without aura without status migrainosus, not intractable 05/17/2024   ADHD 04/13/2024   HTN (hypertension) 07/02/2022   Gastroesophageal reflux disease without esophagitis 06/25/2022   Prediabetes 03/27/2021   Class 2 obesity due to excess calories with body mass index (BMI) of 37.0 to 37.9 in adult 03/27/2021   B12 deficiency anemia  01/19/2021   Iron  deficiency anemia 01/17/2021   History of left breast cancer 01/02/2021   HLD (hyperlipidemia) 08/19/2019   HSV-2 (herpes simplex virus 2) infection 08/19/2019   GAD (generalized anxiety disorder) 06/03/2018   Chronic headaches 09/21/2013   Hypothyroidism 09/21/2013    Past Surgical History:  Procedure Laterality Date   APPENDECTOMY  2013   BREAST BIOPSY Left 10/2020   BREAST LUMPECTOMY Left 12/18/2020   BREAST LUMPECTOMY WITH RADIOACTIVE SEED LOCALIZATION Left 12/18/2020   Procedure: LEFT BREAST LUMPECTOMY WITH RADIOACTIVE SEED LOCALIZATION;  Surgeon: Vanderbilt Ned, MD;  Location: [facility];  Service: General;  Laterality: Left;   KNEE ARTHROSCOPY Right    lapband  2013   SENTINEL NODE BIOPSY Left 01/07/2021   Procedure: LEFT SENTINEL NODE BIOPSY;  Surgeon: Vanderbilt Ned, MD;  Location: [facility];  Service: General;  Laterality: Left;   TONSILLECTOMY  2012    Current Outpatient Medications  Medication Sig Dispense Refill   ALPRAZolam  (XANAX ) 0.25 MG tablet TAKE 1 TABLET BY MOUTH TWICE A DAY AS NEEDED 20 tablet 0   atomoxetine  (STRATTERA ) 18 MG capsule Take 1 capsule (18 mg total) by mouth daily. 30 capsule 0   atorvastatin  (LIPITOR) 10 MG tablet TAKE 1 TABLET BY MOUTH EVERY DAY 90 tablet 1   butalbital -acetaminophen -caffeine  (FIORICET) 50-325-40 MG tablet TAKE 1 TABLET BY MOUTH EVERY 6 HOURS AS NEEDED FOR HEADACHE. 20 tablet 5   cyanocobalamin  (VITAMIN B12) 1000 MCG/ML injection Inject 1 mL (1,000 mcg total) into the muscle every 30 (thirty) days. 30 mL 6   cyclobenzaprine  (FLEXERIL ) 10 MG tablet TAKE 1 TABLET (10 MG TOTAL) BY MOUTH DAILY AS NEEDED (MIGRAINES). 15 tablet 5   Fezolinetant  (VEOZAH ) 45 MG TABS Take 1 tablet (45 mg total) by mouth daily. 30 tablet 1   gabapentin  (NEURONTIN ) 300 MG capsule TAKE 1 CAPSULE BY MOUTH TWICE A DAY 60 capsule 1   KLOR-CON  M10 10 MEQ tablet TAKE 1 TABLET BY MOUTH TWICE A DAY (Patient taking differently: As  needed) 180 tablet 1   levocetirizine (XYZAL ) 5 MG tablet TAKE 1 TABLET BY MOUTH EVERY DAY IN THE EVENING 90 tablet 3   levothyroxine  (SYNTHROID ) 88 MCG tablet TAKE 1 TABLET BY MOUTH EVERY DAY 90 tablet 0   losartan  (COZAAR ) 50 MG tablet TAKE 1 TABLET BY MOUTH EVERY DAY 90 tablet 0   NURTEC 75 MG TBDP Take 1 tablet (75 mg total) by mouth daily as needed (migraine headache). Max 1 tablet in 24 hours. 8 tablet 2   omeprazole  (PRILOSEC) 20 MG capsule TAKE 1 CAPSULE BY MOUTH EVERY DAY (Patient taking differently: Take by mouth daily. As needed) 90 capsule 1   ondansetron  (ZOFRAN -ODT) 8 MG disintegrating tablet Take 1 tablet (8 mg total) by mouth every 8 (eight) hours as needed.  20 tablet 11   Ubrogepant  (UBRELVY ) 100 MG TABS Take 1 tablet (100 mg total) by mouth every 2 (two) hours as needed. Maximum 200mg  a day. 6 tablet    valACYclovir  (VALTREX ) 500 MG tablet TAKE 1 TABLET (500 MG TOTAL) BY MOUTH DAILY AS NEEDED (HERPES SIMPLEX). 90 tablet 1   venlafaxine  XR (EFFEXOR -XR) 37.5 MG 24 hr capsule Take 1 capsule (37.5 mg total) by mouth daily with breakfast. 90 capsule 1   Galcanezumab -gnlm (EMGALITY ) 120 MG/ML SOAJ Inject 120 mg into the skin every 30 (thirty) days. Please use copay card: BIN [id number] PCN PDMI GRP [id number] ID [id number] EXP 09/06/2024 1 mL 11   No current facility-administered medications for this visit.   Facility-Administered Medications Ordered in Other Visits  Medication Dose Route Frequency Provider Last Rate Last Admin   cyanocobalamin  (VITAMIN B12) injection 1,000 mcg  1,000 mcg Intramuscular Once Lanny Callander, MD        Allergies as of 05/17/2024 - Review Complete 05/17/2024  Allergen Reaction Noted   Ciprofloxacin  11/08/2013   Keflex [cephalexin] Hives 11/08/2013   Trazodone  and nefazodone Other (See Comments) 12/27/2015    Vitals: BP 124/75 (BP Location: Right Arm, Patient Position: Sitting, Cuff Size: Large)   Pulse 79   Ht 5' 5.5 (1.664 m)   Wt 223 lb 9.6 oz (101.4  kg)   BMI 36.64 kg/m  Last Weight:  Wt Readings from Last 1 Encounters:  05/17/24 223 lb 9.6 oz (101.4 kg)   Last Height:   Ht Readings from Last 1 Encounters:  05/17/24 5' 5.5 (1.664 m)     Physical exam: Exam: Gen: NAD, conversant, well nourised, obese, well groomed                     CV: RRR, no MRG. No Carotid Bruits. No peripheral edema, warm, nontender Eyes: Conjunctivae clear without exudates or hemorrhage  Neuro: Detailed Neurologic Exam  Speech:    Speech is normal; fluent and spontaneous with normal comprehension.  Cognition:    The patient is oriented to person, place, and time;     recent and remote memory intact;     language fluent;     normal attention, concentration,     fund of knowledge Cranial Nerves:    The pupils are equal, round, and reactive to light. The fundi are normal and spontaneous venous pulsations are present. Visual fields are full to finger confrontation. Extraocular movements are intact. Trigeminal sensation is intact and the muscles of mastication are normal. The face is symmetric. The palate elevates in the midline. Hearing intact. Voice is normal. Shoulder shrug is normal. The tongue has normal motion without fasciculations.   Coordination: nml  Gait: nml  Motor Observation:    No asymmetry, no atrophy, and no involuntary movements noted. Tone:    Normal muscle tone.    Posture:    Posture is normal. normal erect    Strength:    Strength is V/V in the upper and lower limbs.      Sensation: intact to LT     Reflex Exam:  DTR's:    Deep tendon reflexes in the upper and lower extremities are normal bilaterally.   Toes:    The toes are downgoing bilaterally.   Clonus:    Clonus is absent.    Assessment/Plan:  Patient with long-standing chronic migraines, failed multiple medications  Start emgality  monthly: first month 2 injections then one injection every month (also consider Ajovy, Aimovig, Qulipta,  Vyepti or botox  for migraine) For acute migraine, fioricet very sparingly and nurtec, try ubrelvy  Ondansetron  for nausea Next can try Zavzpret or Ubrelvy  for acute management Will give samples ubrelvy  to try  No orders of the defined types were placed in this encounter.  Meds ordered this encounter  Medications   ondansetron  (ZOFRAN -ODT) 8 MG disintegrating tablet    Sig: Take 1 tablet (8 mg total) by mouth every 8 (eight) hours as needed.    Dispense:  20 tablet    Refill:  11   DISCONTD: Galcanezumab -gnlm (EMGALITY ) 120 MG/ML SOAJ    Sig: Inject 120 mg into the skin every 30 (thirty) days. Loading dose fill first. This is first month dose inject two. Every month thereafter inject once monthly. Please use copay card: BIN [id number] PCN PDMI GRP [id number] ID [id number] EXP 09/06/2024    Dispense:  2 mL    Refill:  0    Loading dose fill first. This is first month dose inject two. Every month thereafter inject once monthly. Please use copay card: BIN [id number] PCN PDMI GRP [id number] ID [id number] EXP 09/06/2024   Ubrogepant  (UBRELVY ) 100 MG TABS    Sig: Take 1 tablet (100 mg total) by mouth every 2 (two) hours as needed. Maximum 200mg  a day.    Dispense:  6 tablet   Galcanezumab -gnlm (EMGALITY ) 120 MG/ML SOAJ    Sig: Inject 120 mg into the skin every 30 (thirty) days. Please use copay card: BIN [id number] PCN PDMI GRP [id number] ID [id number] EXP 09/06/2024    Dispense:  1 mL    Refill:  11    Please use copay card: BIN [id number] PCN PDMI GRP [id number] ID [id number] EXP 09/06/2024    Cc: Antonette Angeline ORN, NP,  Antonette Angeline ORN, NP  Onetha Epp, MD  [facility] [address] [place], [place] [address]  Phone [phone] Fax [phone]

## 2024-05-17 NOTE — Patient Instructions (Addendum)
 Start emgality  monthly: first month 2 injections then one injection every month (also consider Ajovy, Aimovig, Qulipta, Vyepti or botox for migraine) For recurrent migraine, fioricet very sparingly and nurtec, try ubrelvy  Ondansetron  for nausea Next can try Zavzpret or Ubrelvy  Will give samples ubrelvy  to try  Galcanezumab  Injection What is this medication? GALCANEZUMAB  (gal ka NEZ ue mab) prevents migraines. It works by blocking a substance in the body that causes migraines. It may also be used to treat cluster headaches. It is a monoclonal antibody. This medicine may be used for other purposes; ask your health care provider or pharmacist if you have questions. COMMON BRAND NAME(S): Emgality  What should I tell my care team before I take this medication? They need to know if you have any of these conditions: Circulation problems in fingers or toes (Raynaud syndrome) High blood pressure An unusual or allergic reaction to galcanezumab , other medications, foods, dyes, or preservatives Pregnant or trying to get pregnant Breastfeeding How should I use this medication? This medication is injected under the skin. You will be taught how to prepare and give it. Take it as directed on the prescription label. Keep taking it unless your care team tells you to stop. It is important that you put your used needles and syringes in a special sharps container. Do not put them in a trash can. If you do not have a sharps container, call your pharmacist or care team to get one. Talk to your care team about the use of this medication in children. Special care may be needed. Overdosage: If you think you have taken too much of this medicine contact a poison control center or emergency room at once. NOTE: This medicine is only for you. Do not share this medicine with others. What if I miss a dose? If you miss a dose, take it as soon as you can. If it is almost time for your next dose, take only that dose. Do not  take double or extra doses. What may interact with this medication? Interactions are not expected. This list may not describe all possible interactions. Give your health care provider a list of all the medicines, herbs, non-prescription drugs, or dietary supplements you use. Also tell them if you smoke, drink alcohol, or use illegal drugs. Some items may interact with your medicine. What should I watch for while using this medication? Visit your care team for regular checks on your progress. Tell your care team if your symptoms do not start to get better or if they get worse. What side effects may I notice from receiving this medication? Side effects that you should report to your care team as soon as possible: Allergic reactions or angioedema--skin rash, itching or hives, swelling of the face, eyes, lips, tongue, arms, or legs, trouble swallowing or breathing Increase in blood pressure Raynaud syndrome--cool, numb, or painful fingers or toes that may change color from pale, to blue, to red Side effects that usually do not require medical attention (report these to your care team if they continue or are bothersome): Pain, redness, or irritation at injection site This list may not describe all possible side effects. Call your doctor for medical advice about side effects. You may report side effects to FDA at 1-800-FDA-1088. Where should I keep my medication? Keep out of the reach of children and pets. Store in a refrigerator or at room temperature between 20 and 25 degrees C (68 and 77 degrees F). Refrigeration (preferred): Store in the refrigerator. Do not freeze.  Keep in the original container until you are ready to take it. Remove the dose from the carton about 30 minutes before it is time for you to use it. If the dose is not used, it may be stored in original container at room temperature for 7 days. Get rid of any unused medication after the expiration date. Room Temperature: This medication  may be stored at room temperature for up to 7 days. Keep it in the original container. Protect from light until time of use. If it is stored at room temperature, get rid of any unused medication after 7 days or after it expires, whichever is first. To get rid of medications that are no longer needed or have expired: Take the medication to a medication take-back program. Check with your pharmacy or law enforcement to find a location. If you cannot return the medication, ask your pharmacist or care team how to get rid of this medication safely. NOTE: This sheet is a summary. It may not cover all possible information. If you have questions about this medicine, talk to your doctor, pharmacist, or health care provider.  2025 Elsevier/Gold Standard (2023-12-02 00:00:00)  Ubrogepant  Tablets What is this medication? UBROGEPANT  (ue BROE je pant) treats migraines. It works by blocking a substance in the body that causes migraines. It is not used to prevent migraines. This medicine may be used for other purposes; ask your health care provider or pharmacist if you have questions. COMMON BRAND NAME(S): Ubrelvy  What should I tell my care team before I take this medication? They need to know if you have any of these conditions: Circulation problems in fingers or toes (Raynaud syndrome) High blood pressure Kidney disease Liver disease An unusual or allergic reaction to ubrogepant , other medications, foods, dyes, or preservatives Pregnant or trying to get pregnant Breastfeeding How should I use this medication? Take this medication by mouth with a glass of water. Take it as directed on the prescription label. You can take it with or without food. If it upsets your stomach, take it with food. Keep taking it unless your care team tells you to stop. Talk to your care team about the use of this medication in children. Special care may be needed. Overdosage: If you think you have taken too much of this medicine  contact a poison control center or emergency room at once. NOTE: This medicine is only for you. Do not share this medicine with others. What if I miss a dose? This does not apply. This medication is not for regular use. What may interact with this medication? Do not take this medication with any of the following: Adagrasib Ceritinib Certain antibiotics, such as chloramphenicol, clarithromycin, telithromycin Certain antivirals for HIV, such as atazanavir, cobicistat, darunavir, delavirdine, fosamprenavir, indinavir, ritonavir Certain medications for fungal infections, such as itraconazole, ketoconazole, posaconazole, voriconazole Conivaptan Grapefruit Idelalisib Mifepristone Nefazodone Ribociclib This medication may also interact with the following: Carvedilol Certain medications for seizures, such as phenobarbital, phenytoin Ciprofloxacin Cyclosporine Eltrombopag Fluconazole  Fluvoxamine Quinidine Rifampin St. John's wort Verapamil  This list may not describe all possible interactions. Give your health care provider a list of all the medicines, herbs, non-prescription drugs, or dietary supplements you use. Also tell them if you smoke, drink alcohol, or use illegal drugs. Some items may interact with your medicine. What should I watch for while using this medication? Visit your care team for regular checks on your progress. Tell your care team if your symptoms do not start to get better or if they  get worse. What side effects may I notice from receiving this medication? Side effects that you should report to your care team as soon as possible: Allergic reactions or angioedema--skin rash, itching or hives, swelling of the face, eyes, lips, tongue, arms, or legs, trouble swallowing or breathing Increase in blood pressure Raynaud syndrome--cool, numb, or painful fingers or toes that may change color from pale, to blue, to red Side effects that usually do not require medical attention  (report these to your care team if they continue or are bothersome): Drowsiness Dry mouth Fatigue Nausea This list may not describe all possible side effects. Call your doctor for medical advice about side effects. You may report side effects to FDA at 1-800-FDA-1088. Where should I keep my medication? Keep out of the reach of children and pets. Store between 15 and 30 degrees C (59 and 86 degrees F). Get rid of any unused medication after the expiration date. To get rid of medications that are no longer needed or have expired: Take the medication to a medication take-back program. Check with your pharmacy or law enforcement to find a location. If you cannot return the medication, check the label or package insert to see if the medication should be thrown out in the garbage or flushed down the toilet. If you are not sure, ask your care team. If it is safe to put it in the trash, pour the medication out of the container. Mix the medication with cat litter, dirt, coffee grounds, or other unwanted substance. Seal the mixture in a bag or container. Put it in the trash. NOTE: This sheet is a summary. It may not cover all possible information. If you have questions about this medicine, talk to your doctor, pharmacist, or health care provider.  2025 Elsevier/Gold Standard (2023-12-02 00:00:00)

## 2024-05-20 ENCOUNTER — Encounter: Payer: Self-pay | Admitting: Internal Medicine

## 2024-05-22 ENCOUNTER — Telehealth (INDEPENDENT_AMBULATORY_CARE_PROVIDER_SITE_OTHER): Admitting: Internal Medicine

## 2024-05-22 ENCOUNTER — Encounter: Payer: Self-pay | Admitting: Internal Medicine

## 2024-05-22 DIAGNOSIS — F901 Attention-deficit hyperactivity disorder, predominantly hyperactive type: Secondary | ICD-10-CM | POA: Diagnosis not present

## 2024-05-22 DIAGNOSIS — F5105 Insomnia due to other mental disorder: Secondary | ICD-10-CM | POA: Diagnosis not present

## 2024-05-22 DIAGNOSIS — F99 Mental disorder, not otherwise specified: Secondary | ICD-10-CM

## 2024-05-22 DIAGNOSIS — F411 Generalized anxiety disorder: Secondary | ICD-10-CM | POA: Diagnosis not present

## 2024-05-22 MED ORDER — VENLAFAXINE HCL ER 75 MG PO CP24: 75.0000 mg | ORAL_CAPSULE | Freq: Every day | ORAL | 1 refills | Status: AC

## 2024-05-22 MED ORDER — TRAZODONE HCL 50 MG PO TABS: 25.0000 mg | ORAL_TABLET | Freq: Every evening | ORAL | 1 refills | Status: AC | PRN

## 2024-05-22 NOTE — Patient Instructions (Signed)
 Insomnia Insomnia is a sleep disorder that makes it difficult to fall asleep or stay asleep. Insomnia can cause fatigue, low energy, difficulty concentrating, mood swings, and poor performance at work or school. There are three different ways to classify insomnia: Difficulty falling asleep. Difficulty staying asleep. Waking up too early in the morning. Any type of insomnia can be long-term (chronic) or short-term (acute). Both are common. Short-term insomnia usually lasts for 3 months or less. Chronic insomnia occurs at least three times a week for longer than 3 months. What are the causes? Insomnia may be caused by another condition, situation, or substance, such as: Having certain mental health conditions, such as anxiety and depression. Using caffeine, alcohol , tobacco, or drugs. Having gastrointestinal conditions, such as gastroesophageal reflux disease (GERD). Having certain medical conditions. These include: Asthma. Alzheimer's disease. Stroke. Chronic pain. An overactive thyroid  gland (hyperthyroidism). Other sleep disorders, such as restless legs syndrome and sleep apnea. Menopause. Sometimes, the cause of insomnia may not be known. What increases the risk? Risk factors for insomnia include: Gender. Females are affected more often than males. Age. Insomnia is more common as people get older. Stress and certain medical and mental health conditions. Lack of exercise. Having an irregular work schedule. This may include working night shifts and traveling between different time zones. What are the signs or symptoms? If you have insomnia, the main symptom is having trouble falling asleep or having trouble staying asleep. This may lead to other symptoms, such as: Feeling tired or having low energy. Feeling nervous about going to sleep. Not feeling rested in the morning. Having trouble concentrating. Feeling irritable, anxious, or depressed. How is this diagnosed? This condition  may be diagnosed based on: Your symptoms and medical history. Your health care provider may ask about: Your sleep habits. Any medical conditions you have. Your mental health. A physical exam. How is this treated? Treatment for insomnia depends on the cause. Treatment may focus on treating an underlying condition that is causing the insomnia. Treatment may also include: Medicines to help you sleep. Counseling or therapy. Lifestyle adjustments to help you sleep better. Follow these instructions at home: Eating and drinking  Limit or avoid alcohol , caffeinated beverages, and products that contain nicotine and tobacco, especially close to bedtime. These can disrupt your sleep. Do not eat a large meal or eat spicy foods right before bedtime. This can lead to digestive discomfort that can make it hard for you to sleep. Sleep habits  Keep a sleep diary to help you and your health care provider figure out what could be causing your insomnia. Write down: When you sleep. When you wake up during the night. How well you sleep and how rested you feel the next day. Any side effects of medicines you are taking. What you eat and drink. Make your bedroom a dark, comfortable place where it is easy to fall asleep. Put up shades or blackout curtains to block light from outside. Use a white noise machine to block noise. Keep the temperature cool. Limit screen use before bedtime. This includes: Not watching TV. Not using your smartphone, tablet, or computer. Stick to a routine that includes going to bed and waking up at the same times every day and night. This can help you fall asleep faster. Consider making a quiet activity, such as reading, part of your nighttime routine. Try to avoid taking naps during the day so that you sleep better at night. Get out of bed if you are still awake after  15 minutes of trying to sleep. Keep the lights down, but try reading or doing a quiet activity. When you feel  sleepy, go back to bed. General instructions Take over-the-counter and prescription medicines only as told by your health care provider. Exercise regularly as told by your health care provider. However, avoid exercising in the hours right before bedtime. Use relaxation techniques to manage stress. Ask your health care provider to suggest some techniques that may work well for you. These may include: Breathing exercises. Routines to release muscle tension. Visualizing peaceful scenes. Make sure that you drive carefully. Do not drive if you feel very sleepy. Keep all follow-up visits. This is important. Contact a health care provider if: You are tired throughout the day. You have trouble in your daily routine due to sleepiness. You continue to have sleep problems, or your sleep problems get worse. Get help right away if: You have thoughts about hurting yourself or someone else. Get help right away if you feel like you may hurt yourself or others, or have thoughts about taking your own life. Go to your nearest emergency room or: Call 911. Call the National Suicide Prevention Lifeline at 2232757840 or 988. This is open 24 hours a day. Text the Crisis Text Line at 657-529-4371. Summary Insomnia is a sleep disorder that makes it difficult to fall asleep or stay asleep. Insomnia can be long-term (chronic) or short-term (acute). Treatment for insomnia depends on the cause. Treatment may focus on treating an underlying condition that is causing the insomnia. Keep a sleep diary to help you and your health care provider figure out what could be causing your insomnia. This information is not intended to replace advice given to you by your health care provider. Make sure you discuss any questions you have with your health care provider. Document Revised: 08/04/2021 Document Reviewed: 08/04/2021 Elsevier Patient Education  2024 [employer].

## 2024-05-22 NOTE — Progress Notes (Signed)
 Virtual Visit via Video Note  I connected with [name] on 05/22/24 at  2:40 PM EDT by a video enabled telemedicine application and verified that I am speaking with the correct person using two identifiers.  Location: Patient: Home Provider: Office  Person's participating in this video call: Angeline Laura, NP-C and Tyesha Joffe   I discussed the limitations of evaluation and management by telemedicine and the availability of in person appointments. The patient expressed understanding and agreed to proceed.  History of Present Illness:    Discussed the use of AI scribe software for clinical note transcription with the patient, who gave verbal consent to proceed.  [name] is a 53 year old female who presents with increased irritability and sleep disturbances after starting Strattera .  She has been experiencing significant irritability, described as 'snappy,' which began about a week and a half after starting Strattera . The irritability worsened by the end of last week, impacting her work environment. She is currently taking Strattera  at a dose of 18 mg in the morning around 8:00 AM. No changes in other medications or stress levels have been noted that could account for the increased irritability.  In addition to Strattera , she is taking Effexor  at a dose of 37.5 mg and uses Xanax  as needed, although she has not filled the prescription for Xanax  in a long time. She is also receiving Emgality  injections for migraines.  She reports difficulty with sleep, specifically with falling asleep.  She mentions having experienced 'weird night dreams' on trazodone  about 15-20 years ago when she was on a higher dosage, but she does not consider herself allergic to it. These dreams were described as 'squirrely' but not nightmares. She has not experienced any issues with staying asleep once she falls asleep.  No changes in stress levels or other medications that could contribute  to her symptoms.       Past Medical History:  Diagnosis Date   ADHD    June 2024   Allergy     Anemia    present for many years, mild, stable   Anxiety    Breast cancer (HCC)    Breast cancer (HCC) 12/18/2020   Dysrhythmia    PAC's on flecanide (did not tolerate beta blockers)   Family history of breast cancer 01/20/2021   Family history of prostate cancer 01/20/2021   Frequent headaches    HSV infection    fever blisters   Hypertension    Hypothyroidism    Personal history of radiation therapy     Current Outpatient Medications  Medication Sig Dispense Refill   ALPRAZolam  (XANAX ) 0.25 MG tablet TAKE 1 TABLET BY MOUTH TWICE A DAY AS NEEDED 20 tablet 0   atomoxetine  (STRATTERA ) 18 MG capsule Take 1 capsule (18 mg total) by mouth daily. 30 capsule 0   atorvastatin  (LIPITOR) 10 MG tablet TAKE 1 TABLET BY MOUTH EVERY DAY 90 tablet 1   butalbital -acetaminophen -caffeine  (FIORICET) 50-325-40 MG tablet TAKE 1 TABLET BY MOUTH EVERY 6 HOURS AS NEEDED FOR HEADACHE. 20 tablet 5   cyanocobalamin  (VITAMIN B12) 1000 MCG/ML injection Inject 1 mL (1,000 mcg total) into the muscle every 30 (thirty) days. 30 mL 6   cyclobenzaprine  (FLEXERIL ) 10 MG tablet TAKE 1 TABLET (10 MG TOTAL) BY MOUTH DAILY AS NEEDED (MIGRAINES). 15 tablet 5   Fezolinetant  (VEOZAH ) 45 MG TABS Take 1 tablet (45 mg total) by mouth daily. 30 tablet 1   gabapentin  (NEURONTIN ) 300 MG capsule TAKE 1 CAPSULE BY MOUTH TWICE  A DAY 60 capsule 1   Galcanezumab -gnlm (EMGALITY ) 120 MG/ML SOAJ Inject 120 mg into the skin every 30 (thirty) days. Please use copay card: BIN [id number] PCN [id number] GRP [id number] ID [id number] EXP 09/06/2024 1 mL 11   KLOR-CON  M10 10 MEQ tablet TAKE 1 TABLET BY MOUTH TWICE A DAY (Patient taking differently: As needed) 180 tablet 1   levocetirizine (XYZAL ) 5 MG tablet TAKE 1 TABLET BY MOUTH EVERY DAY IN THE EVENING 90 tablet 3   levothyroxine  (SYNTHROID ) 88 MCG tablet TAKE 1 TABLET BY MOUTH EVERY DAY 90 tablet 0    losartan  (COZAAR ) 50 MG tablet TAKE 1 TABLET BY MOUTH EVERY DAY 90 tablet 0   NURTEC 75 MG TBDP Take 1 tablet (75 mg total) by mouth daily as needed (migraine headache). Max 1 tablet in 24 hours. 8 tablet 2   omeprazole  (PRILOSEC) 20 MG capsule TAKE 1 CAPSULE BY MOUTH EVERY DAY (Patient taking differently: Take by mouth daily. As needed) 90 capsule 1   ondansetron  (ZOFRAN -ODT) 8 MG disintegrating tablet Take 1 tablet (8 mg total) by mouth every 8 (eight) hours as needed. 20 tablet 11   Ubrogepant  (UBRELVY ) 100 MG TABS Take 1 tablet (100 mg total) by mouth every 2 (two) hours as needed. Maximum 200mg  a day. 6 tablet    valACYclovir  (VALTREX ) 500 MG tablet TAKE 1 TABLET (500 MG TOTAL) BY MOUTH DAILY AS NEEDED (HERPES SIMPLEX). 90 tablet 1   venlafaxine  XR (EFFEXOR -XR) 37.5 MG 24 hr capsule Take 1 capsule (37.5 mg total) by mouth daily with breakfast. 90 capsule 1   No current facility-administered medications for this visit.   Facility-Administered Medications Ordered in Other Visits  Medication Dose Route Frequency Provider Last Rate Last Admin   cyanocobalamin  (VITAMIN B12) injection 1,000 mcg  1,000 mcg Intramuscular Once Lanny Callander, MD        Allergies  Allergen Reactions   Ciprofloxacin     anxiety   Keflex [Cephalexin] Hives   Trazodone  And Nefazodone Other (See Comments)    nightmares    Family History  Problem Relation Age of Onset   Depression Mother    Breast cancer Mother 85   Hyperlipidemia Father    Prostate cancer Father 90    Social History   Socioeconomic History   Marital status: Single    Spouse name: Not on file   Number of children: 1   Years of education: Not on file   Highest education level: Bachelor's degree (e.g., BA, AB, BS)  Occupational History   Not on file  Tobacco Use   Smoking status: Never   Smokeless tobacco: Never  Vaping Use   Vaping status: Never Used  Substance and Sexual Activity   Alcohol use: Yes    Comment: occasional   Drug  use: No   Sexual activity: Yes    Partners: Male    Birth control/protection: Pill  Other Topics Concern   Not on file  Social History Narrative   Not on file   Social Drivers of Health   Financial Resource Strain: Low Risk  (04/26/2024)   Overall Financial Resource Strain (CARDIA)    Difficulty of Paying Living Expenses: Not very hard  Food Insecurity: No Food Insecurity (04/26/2024)   Hunger Vital Sign    Worried About Running Out of Food in the Last Year: Never true    Ran Out of Food in the Last Year: Never true  Transportation Needs: No Transportation Needs (04/26/2024)   PRAPARE - Transportation  Lack of Transportation (Medical): No    Lack of Transportation (Non-Medical): No  Physical Activity: Insufficiently Active (04/26/2024)   Exercise Vital Sign    Days of Exercise per Week: 2 days    Minutes of Exercise per Session: 30 min  Stress: No Stress Concern Present (04/26/2024)   [employer] of Occupational Health - Occupational Stress Questionnaire    Feeling of Stress: Only a little  Social Connections: Moderately Isolated (04/26/2024)   Social Connection and Isolation Panel    Frequency of Communication with Friends and Family: More than three times a week    Frequency of Social Gatherings with Friends and Family: Once a week    Attends Religious Services: Never    Database administrator or Organizations: Yes    Attends Banker Meetings: 1 to 4 times per year    Marital Status: Never married  Intimate Partner Violence: Not At Risk (01/02/2021)   Humiliation, Afraid, Rape, and Kick questionnaire    Fear of Current or Ex-Partner: No    Emotionally Abused: No    Physically Abused: No    Sexually Abused: No     Constitutional: Patient reports intermittent headaches, difficulty losing weight.  Denies fever, malaise, fatigue, or abrupt weight changes.  Respiratory: Denies difficulty breathing, shortness of breath, cough or sputum production.    Cardiovascular: Denies chest pain, chest tightness, palpitations or swelling in the hands or feet.  Gastrointestinal: Denies abdominal pain, bloating, constipation, diarrhea or blood in the stool.  GU: Denies urgency, frequency, pain with urination, burning sensation, blood in urine, odor or discharge. Musculoskeletal: Denies decrease in range of motion, difficulty with gait, muscle pain or joint pain and swelling.  Skin: Denies redness, rashes, lesions or ulcercations.  Neurological: Patient reports impulsivity, difficulty completing tasks, insomnia. Denies dizziness, difficulty with memory, difficulty with speech or problems with balance and coordination.  Psych: Patient has a history of anxiety.  Denies depression, SI/HI.  No other specific complaints in a complete review of systems (except as listed in HPI above).  Observations/Objective:  Wt Readings from Last 3 Encounters:  05/17/24 223 lb 9.6 oz (101.4 kg)  05/04/24 225 lb 6.4 oz (102.2 kg)  03/09/24 221 lb 12.8 oz (100.6 kg)    General: Appears her stated age, obese, in NAD. Pulmonary/Chest: Normal effort. No respiratory distress.  Neurological: Alert and oriented. Coordination normal.  Psychiatric: Mood and affect normal. Behavior is normal. Judgment and thought content normal.     BMET    Component Value Date/Time   NA 139 03/09/2024 1120   NA 143 01/26/2020 1600   K 3.8 03/09/2024 1120   CL 104 03/09/2024 1120   CO2 30 03/09/2024 1120   GLUCOSE 105 (H) 03/09/2024 1120   BUN 10 03/09/2024 1120   BUN 11 01/26/2020 1600   CREATININE 0.97 03/09/2024 1120   CREATININE 1.03 02/03/2024 0816   CALCIUM  9.4 03/09/2024 1120   GFRNONAA >60 03/09/2024 1120   GFRAA >60 02/25/2020 2231    Lipid Panel     Component Value Date/Time   CHOL 261 (H) 02/03/2024 0816   TRIG 131 02/03/2024 0816   HDL 48 (L) 02/03/2024 0816   CHOLHDL 5.4 (H) 02/03/2024 0816   VLDL 18.8 12/21/2019 1204   LDLCALC 186 (H) 02/03/2024 0816     CBC    Component Value Date/Time   WBC 3.9 (L) 03/09/2024 1120   WBC 3.7 (L) 02/03/2024 0816   RBC 4.14 03/09/2024 1120   HGB 12.1 03/09/2024  1120   HGB 10.1 (L) 04/25/2020 1154   HCT 35.5 (L) 03/09/2024 1120   HCT 31.6 (L) 04/25/2020 1154   PLT 233 03/09/2024 1120   PLT 261 04/25/2020 1154   MCV 85.7 03/09/2024 1120   MCV 80 04/25/2020 1154   MCH 29.2 03/09/2024 1120   MCHC 34.1 03/09/2024 1120   RDW 13.6 03/09/2024 1120   RDW 16.8 (H) 04/25/2020 1154   LYMPHSABS 1.2 03/09/2024 1120   LYMPHSABS 1.4 04/25/2020 1154   MONOABS 0.2 03/09/2024 1120   EOSABS 0.1 03/09/2024 1120   EOSABS 0.0 04/25/2020 1154   BASOSABS 0.1 03/09/2024 1120   BASOSABS 0.1 04/25/2020 1154    Hgb A1C Lab Results  Component Value Date   HGBA1C 5.6 02/03/2024       Assessment and Plan:  Assessment and Plan    Attention-deficit hyperactivity disorder, predominantly hyperactive type Increased irritability since starting Strattera  18 mg, possibly due to ADHD medication. ADHD medications can worsen anxiety and mood disorders. - Increase Effexor  to 75 mg daily and monitor irritability. -Continue Xanax  only as needed - Consider switching Strattera  if irritability persists after one month.  Major depressive disorder and generalized anxiety disorder Currently managed on Effexor  37.5 mg, well-controlled. Increase dose to address irritability related to ADHD medication. - Increase Effexor  to 75 mg daily. -Continue Xanax  only as needed - Monitor mood and anxiety symptoms for one month.  Insomnia Difficulty falling asleep, possibly exacerbated by ADHD medication. Previously had vivid dreams on trazodone , not allergic. Advised on dependency risks with sleep medications. - Prescribe trazodone  50 mg, take 25 mg as needed for sleep, one hour before bedtime. - Advise strategic use of trazodone  to prevent dependency, such as on weekends or after several nights of poor sleep.      RTC in 2 months for  your annual exam  Follow Up Instructions:    I discussed the assessment and treatment plan with the patient. The patient was provided an opportunity to ask questions and all were answered. The patient agreed with the plan and demonstrated an understanding of the instructions.   The patient was advised to call back or seek an in-person evaluation if the symptoms worsen or if the condition fails to improve as anticipated.   Angeline Laura, NP

## 2024-05-23 ENCOUNTER — Other Ambulatory Visit: Payer: Self-pay | Admitting: Internal Medicine

## 2024-05-25 ENCOUNTER — Other Ambulatory Visit: Payer: Self-pay | Admitting: Internal Medicine

## 2024-05-25 NOTE — Telephone Encounter (Signed)
 Requested medications are due for refill today.  yes  Requested medications are on the active medications list.  yes  Last refill. 04/27/2024 #30 0 rf  Future visit scheduled.   yes  Notes to clinic.  Refills not delegated.    Requested Prescriptions  Pending Prescriptions Disp Refills   atomoxetine  (STRATTERA ) 18 MG capsule [Pharmacy Med Name: ATOMOXETINE  HCL 18 MG CAPSULE] 90 capsule 1    Sig: TAKE 1 CAPSULE BY MOUTH DAILY.     Not Delegated - Psychiatry: Norepinephrine Reuptake Inhibitor Failed - 05/25/2024  9:32 AM      Failed - This refill cannot be delegated      Passed - Completed PHQ-2 or PHQ-9 in the last 360 days      Passed - Last BP in normal range    BP Readings from Last 1 Encounters:  05/17/24 124/75         Passed - Last Heart Rate in normal range    Pulse Readings from Last 1 Encounters:  05/17/24 79         Passed - Valid encounter within last 6 months    Recent Outpatient Visits           3 days ago Attention deficit hyperactivity disorder (ADHD), predominantly hyperactive type   [place] [facility] [place], Angeline ORN, NP   3 weeks ago Acute migraine   [facility] [place], Angeline ORN, NP   4 weeks ago Attention deficit hyperactivity disorder (ADHD), predominantly hyperactive type   [place] [facility] [place], Angeline ORN, NP   3 months ago Pure hypercholesterolemia   [facility] [place], Angeline ORN, NP   6 months ago Inattention   [place] [facility] [place], Angeline ORN, NP       Future Appointments             In 6 months Whitfield Raisin, NP [facility]

## 2024-05-26 NOTE — Telephone Encounter (Signed)
 Requested medication (s) are due for refill today: yes  Requested medication (s) are on the active medication list: yes  Last refill:  12/10/23 #20   Future visit scheduled:  yes  Notes to clinic:  med not delegated to NT to RF   Requested Prescriptions  Pending Prescriptions Disp Refills   ALPRAZolam  (XANAX ) 0.25 MG tablet [Pharmacy Med Name: ALPRAZOLAM  0.25 MG TABLET] 20 tablet 0    Sig: TAKE 1 TABLET BY MOUTH TWICE A DAY AS NEEDED     Not Delegated - Psychiatry: Anxiolytics/Hypnotics 2 Failed - 05/26/2024  1:35 PM      Failed - This refill cannot be delegated      Failed - Urine Drug Screen completed in last 360 days      Passed - Patient is not pregnant      Passed - Valid encounter within last 6 months    Recent Outpatient Visits           4 days ago Attention deficit hyperactivity disorder (ADHD), predominantly hyperactive type   [place] [facility] [place], Angeline ORN, NP   3 weeks ago Acute migraine   [place] [facility] [place], [place], NP   4 weeks ago Attention deficit hyperactivity disorder (ADHD), predominantly hyperactive type   [facility] [place], Angeline ORN, NP   3 months ago Pure hypercholesterolemia   [place] [facility] [place], Angeline ORN, NP   6 months ago Inattention   [place] [facility] [place], Angeline ORN, NP       Future Appointments             In 6 months Whitfield Raisin, NP [facility]

## 2024-07-02 ENCOUNTER — Other Ambulatory Visit: Payer: Self-pay | Admitting: Nurse Practitioner

## 2024-07-04 ENCOUNTER — Encounter: Payer: Self-pay | Admitting: Hematology

## 2024-07-14 ENCOUNTER — Ambulatory Visit (INDEPENDENT_AMBULATORY_CARE_PROVIDER_SITE_OTHER): Admitting: Internal Medicine

## 2024-07-14 VITALS — BP 124/74 | Ht 65.5 in | Wt 226.4 lb

## 2024-07-14 DIAGNOSIS — E039 Hypothyroidism, unspecified: Secondary | ICD-10-CM

## 2024-07-14 DIAGNOSIS — R7303 Prediabetes: Secondary | ICD-10-CM

## 2024-07-14 DIAGNOSIS — Z0001 Encounter for general adult medical examination with abnormal findings: Secondary | ICD-10-CM | POA: Diagnosis not present

## 2024-07-14 DIAGNOSIS — E7800 Pure hypercholesterolemia, unspecified: Secondary | ICD-10-CM | POA: Diagnosis not present

## 2024-07-14 DIAGNOSIS — Z1231 Encounter for screening mammogram for malignant neoplasm of breast: Secondary | ICD-10-CM | POA: Diagnosis not present

## 2024-07-14 DIAGNOSIS — E66812 Obesity, class 2: Secondary | ICD-10-CM

## 2024-07-14 DIAGNOSIS — Z6837 Body mass index (BMI) 37.0-37.9, adult: Secondary | ICD-10-CM

## 2024-07-14 MED ORDER — ALPRAZOLAM 0.25 MG PO TABS: 0.2500 mg | ORAL_TABLET | Freq: Two times a day (BID) | ORAL | 0 refills | Status: DC | PRN

## 2024-07-14 NOTE — Patient Instructions (Signed)

## 2024-07-14 NOTE — Progress Notes (Signed)
 Subjective:    Patient ID: [name], female    DOB: 01-Nov-1970, 53 y.o.   MRN: [id number]  HPI  Patient presents to clinic today for her annual exam.  Flu: Never Tetanus: 07/2016 COVID: X 2 Shingrix: Never Pap smear: 02/2021, [facility] Mammogram: 12/2023 Colon screening: 10/2022, [facility] Vision screening: as needed Dentist: biannually  Diet: She does eat meat. She consumes veggies, no fruits. She does eat some fried foods. She drinks mostly water, tea Exercise: Walking   Review of Systems     Past Medical History:  Diagnosis Date   ADHD    June 2024   Allergy     Anemia    present for many years, mild, stable   Anxiety    Breast cancer (HCC)    Breast cancer (HCC) 12/18/2020   Dysrhythmia    PAC's on flecanide (did not tolerate beta blockers)   Family history of breast cancer 01/20/2021   Family history of prostate cancer 01/20/2021   Frequent headaches    HSV infection    fever blisters   Hypertension    Hypothyroidism    Personal history of radiation therapy     Current Outpatient Medications  Medication Sig Dispense Refill   ALPRAZolam  (XANAX ) 0.25 MG tablet TAKE 1 TABLET BY MOUTH TWICE A DAY AS NEEDED 20 tablet 0   atomoxetine  (STRATTERA ) 18 MG capsule TAKE 1 CAPSULE BY MOUTH DAILY. 90 capsule 1   atorvastatin  (LIPITOR) 10 MG tablet TAKE 1 TABLET BY MOUTH EVERY DAY 90 tablet 1   butalbital -acetaminophen -caffeine  (FIORICET) 50-325-40 MG tablet TAKE 1 TABLET BY MOUTH EVERY 6 HOURS AS NEEDED FOR HEADACHE. 20 tablet 5   cyanocobalamin  (VITAMIN B12) 1000 MCG/ML injection Inject 1 mL (1,000 mcg total) into the muscle every 30 (thirty) days. 30 mL 6   cyclobenzaprine  (FLEXERIL ) 10 MG tablet TAKE 1 TABLET (10 MG TOTAL) BY MOUTH DAILY AS NEEDED (MIGRAINES). 15 tablet 5   Fezolinetant  (VEOZAH ) 45 MG TABS Take 1 tablet (45 mg total) by mouth daily. 30 tablet 1   gabapentin  (NEURONTIN ) 300 MG capsule TAKE 1 CAPSULE BY MOUTH TWICE A DAY 60 capsule 1    Galcanezumab -gnlm (EMGALITY ) 120 MG/ML SOAJ Inject 120 mg into the skin every 30 (thirty) days. Please use copay card: BIN [id number] PCN [id number] GRP [id number] ID [id number] EXP 09/06/2024 1 mL 11   KLOR-CON  M10 10 MEQ tablet TAKE 1 TABLET BY MOUTH TWICE A DAY (Patient taking differently: As needed) 180 tablet 1   levocetirizine (XYZAL ) 5 MG tablet TAKE 1 TABLET BY MOUTH EVERY DAY IN THE EVENING 90 tablet 3   levothyroxine  (SYNTHROID ) 88 MCG tablet TAKE 1 TABLET BY MOUTH EVERY DAY 90 tablet 0   losartan  (COZAAR ) 50 MG tablet TAKE 1 TABLET BY MOUTH EVERY DAY 90 tablet 0   NURTEC 75 MG TBDP Take 1 tablet (75 mg total) by mouth daily as needed (migraine headache). Max 1 tablet in 24 hours. 8 tablet 2   omeprazole  (PRILOSEC) 20 MG capsule TAKE 1 CAPSULE BY MOUTH EVERY DAY (Patient taking differently: Take by mouth daily. As needed) 90 capsule 1   ondansetron  (ZOFRAN -ODT) 8 MG disintegrating tablet Take 1 tablet (8 mg total) by mouth every 8 (eight) hours as needed. 20 tablet 11   traZODone  (DESYREL ) 50 MG tablet Take 0.5-1 tablets (25-50 mg total) by mouth at bedtime as needed for sleep. 90 tablet 1   Ubrogepant  (UBRELVY ) 100 MG TABS Take 1 tablet (100 mg total) by mouth every  2 (two) hours as needed. Maximum 200mg  a day. 6 tablet    valACYclovir  (VALTREX ) 500 MG tablet TAKE 1 TABLET (500 MG TOTAL) BY MOUTH DAILY AS NEEDED (HERPES SIMPLEX). 90 tablet 1   venlafaxine  XR (EFFEXOR  XR) 75 MG 24 hr capsule Take 1 capsule (75 mg total) by mouth daily with breakfast. 90 capsule 1   No current facility-administered medications for this visit.   Facility-Administered Medications Ordered in Other Visits  Medication Dose Route Frequency Provider Last Rate Last Admin   cyanocobalamin  (VITAMIN B12) injection 1,000 mcg  1,000 mcg Intramuscular Once Lanny Callander, MD        Allergies  Allergen Reactions   Ciprofloxacin     anxiety   Keflex [Cephalexin] Hives    Family History  Problem Relation Age of Onset    Depression Mother    Breast cancer Mother 53   Hyperlipidemia Father    Prostate cancer Father 67    Social History   Socioeconomic History   Marital status: Single    Spouse name: Not on file   Number of children: 1   Years of education: Not on file   Highest education level: Bachelor's degree (e.g., BA, AB, BS)  Occupational History   Not on file  Tobacco Use   Smoking status: Never   Smokeless tobacco: Never  Vaping Use   Vaping status: Never Used  Substance and Sexual Activity   Alcohol use: Yes    Comment: occasional   Drug use: No   Sexual activity: Yes    Partners: Male    Birth control/protection: Pill  Other Topics Concern   Not on file  Social History Narrative   Not on file   Social Drivers of Health   Financial Resource Strain: Low Risk  (07/14/2024)   Overall Financial Resource Strain (CARDIA)    Difficulty of Paying Living Expenses: Not hard at all  Food Insecurity: No Food Insecurity (07/14/2024)   Hunger Vital Sign    Worried About Running Out of Food in the Last Year: Never true    Ran Out of Food in the Last Year: Never true  Transportation Needs: No Transportation Needs (07/14/2024)   PRAPARE - Administrator, Civil Service (Medical): No    Lack of Transportation (Non-Medical): No  Physical Activity: Insufficiently Active (07/14/2024)   Exercise Vital Sign    Days of Exercise per Week: 2 days    Minutes of Exercise per Session: 20 min  Stress: No Stress Concern Present (07/14/2024)   [employer] of Occupational Health - Occupational Stress Questionnaire    Feeling of Stress: Only a little  Social Connections: Socially Isolated (07/14/2024)   Social Connection and Isolation Panel    Frequency of Communication with Friends and Family: Never    Frequency of Social Gatherings with Friends and Family: Never    Attends Religious Services: Never    Database Administrator or Organizations: No    Attends Engineer, Structural:  Not on file    Marital Status: Never married  Intimate Partner Violence: Not At Risk (01/02/2021)   Humiliation, Afraid, Rape, and Kick questionnaire    Fear of Current or Ex-Partner: No    Emotionally Abused: No    Physically Abused: No    Sexually Abused: No     Constitutional: Patient reports intermittent headaches.  Denies fever, malaise, fatigue, or abrupt weight changes.  HEENT: Denies eye pain, eye redness, ear pain, ringing in the ears, wax buildup,  runny nose, nasal congestion, bloody nose, or sore throat. Respiratory: Denies difficulty breathing, shortness of breath, cough or sputum production.   Cardiovascular: Denies chest pain, chest tightness, palpitations or swelling in the hands or feet.  Gastrointestinal: Denies abdominal pain, bloating, constipation, diarrhea or blood in the stool.  GU: Denies urgency, frequency, pain with urination, burning sensation, blood in urine, odor or discharge. Musculoskeletal: Denies decrease in range of motion, difficulty with gait, muscle pain or joint pain and swelling.  Skin: Denies redness, rashes, lesions or ulcercations.  Neurological: Patient reports inattention.  Denies dizziness, difficulty with memory, difficulty with speech or problems with balance and coordination.  Psych: Patient has a history of anxiety.  Denies depression, SI/HI.  No other specific complaints in a complete review of systems (except as listed in HPI above).  Objective:   Physical Exam  BP 124/74 (BP Location: Right Arm, Patient Position: Sitting, Cuff Size: Normal)   Ht 5' 5.5 (1.664 m)   Wt 226 lb 6.4 oz (102.7 kg)   LMP  (LMP Unknown)   BMI 37.10 kg/m    Wt Readings from Last 3 Encounters:  05/17/24 223 lb 9.6 oz (101.4 kg)  05/04/24 225 lb 6.4 oz (102.2 kg)  03/09/24 221 lb 12.8 oz (100.6 kg)    General: Appears her stated age, obese, in NAD. Skin: Warm, dry and intact.  HEENT: Head: normal shape and size; Eyes: sclera white, no icterus,  conjunctiva pink, PERRLA and EOMs intact;  Neck:  Neck supple, trachea midline. No masses, lumps or thyromegaly present.  Cardiovascular: Normal rate and rhythm. S1,S2 noted.  No murmur, rubs or gallops noted. No JVD or BLE edema. No carotid bruits noted. Pulmonary/Chest: Normal effort and positive vesicular breath sounds. No respiratory distress. No wheezes, rales or ronchi noted.  Abdomen: Soft and nontender. Normal bowel sounds.  Musculoskeletal: Strength 5/5 BUE/BLE. No difficulty with gait.  Neurological: Alert and oriented. Cranial nerves II-XII grossly intact. Coordination normal.  Psychiatric: Mood and affect normal. Behavior is normal. Judgment and thought content normal.   BMET    Component Value Date/Time   NA 139 03/09/2024 1120   NA 143 01/26/2020 1600   K 3.8 03/09/2024 1120   CL 104 03/09/2024 1120   CO2 30 03/09/2024 1120   GLUCOSE 105 (H) 03/09/2024 1120   BUN 10 03/09/2024 1120   BUN 11 01/26/2020 1600   CREATININE 0.97 03/09/2024 1120   CREATININE 1.03 02/03/2024 0816   CALCIUM  9.4 03/09/2024 1120   GFRNONAA >60 03/09/2024 1120   GFRAA >60 02/25/2020 2231    Lipid Panel     Component Value Date/Time   CHOL 261 (H) 02/03/2024 0816   TRIG 131 02/03/2024 0816   HDL 48 (L) 02/03/2024 0816   CHOLHDL 5.4 (H) 02/03/2024 0816   VLDL 18.8 12/21/2019 1204   LDLCALC 186 (H) 02/03/2024 0816    CBC    Component Value Date/Time   WBC 3.9 (L) 03/09/2024 1120   WBC 3.7 (L) 02/03/2024 0816   RBC 4.14 03/09/2024 1120   HGB 12.1 03/09/2024 1120   HGB 10.1 (L) 04/25/2020 1154   HCT 35.5 (L) 03/09/2024 1120   HCT 31.6 (L) 04/25/2020 1154   PLT 233 03/09/2024 1120   PLT 261 04/25/2020 1154   MCV 85.7 03/09/2024 1120   MCV 80 04/25/2020 1154   MCH 29.2 03/09/2024 1120   MCHC 34.1 03/09/2024 1120   RDW 13.6 03/09/2024 1120   RDW 16.8 (H) 04/25/2020 1154   LYMPHSABS 1.2  03/09/2024 1120   LYMPHSABS 1.4 04/25/2020 1154   MONOABS 0.2 03/09/2024 1120   EOSABS 0.1  03/09/2024 1120   EOSABS 0.0 04/25/2020 1154   BASOSABS 0.1 03/09/2024 1120   BASOSABS 0.1 04/25/2020 1154    Hgb A1C Lab Results  Component Value Date   HGBA1C 5.6 02/03/2024           Assessment & Plan:   Preventative health maintenance:  She declines flu shot Tetanus UTD Encouraged her to get her COVID booster Discussed Shingrix vaccine, she will check coverage with her insurance company and schedule visit if she would like to have this done Pap smear UTD, will request copy Mammogram ordered-she will call to schedule Colon screening UTD Encouraged her to consume a balanced diet and exercise regimen Advised her to see an eye doctor and dentist annually We will check CBC, c-Met, lipid, TSH, free T4, A1c today  RTC in 6 months, follow-up chronic conditions Angeline Laura, NP

## 2024-07-14 NOTE — Assessment & Plan Note (Signed)
 Encouraged low-carb diet and exercise for weight loss

## 2024-07-15 ENCOUNTER — Encounter: Payer: Self-pay | Admitting: Internal Medicine

## 2024-07-15 LAB — COMPREHENSIVE METABOLIC PANEL WITH GFR
AG Ratio: 1.4 (calc) (ref 1.0–2.5)
ALT: 7 U/L (ref 6–29)
AST: 12 U/L (ref 10–35)
Albumin: 3.9 g/dL (ref 3.6–5.1)
Alkaline phosphatase (APISO): 114 U/L (ref 37–153)
BUN: 11 mg/dL (ref 7–25)
CO2: 26 mmol/L (ref 20–32)
Calcium: 9.1 mg/dL (ref 8.6–10.4)
Chloride: 103 mmol/L (ref 98–110)
Creat: 0.95 mg/dL (ref 0.50–1.03)
Globulin: 2.7 g/dL (ref 1.9–3.7)
Glucose, Bld: 115 mg/dL — ABNORMAL HIGH (ref 65–99)
Potassium: 4.2 mmol/L (ref 3.5–5.3)
Sodium: 139 mmol/L (ref 135–146)
Total Bilirubin: 0.4 mg/dL (ref 0.2–1.2)
Total Protein: 6.6 g/dL (ref 6.1–8.1)
eGFR: 72 mL/min/1.73m2 (ref >=60)

## 2024-07-15 LAB — HEMOGLOBIN A1C
Hgb A1c MFr Bld: 5.7 % — ABNORMAL HIGH (ref <5.7)
Mean Plasma Glucose: 117 mg/dL
eAG (mmol/L): 6.5 mmol/L

## 2024-07-15 LAB — T4, FREE: Free T4: 1.2 ng/dL (ref 0.8–1.8)

## 2024-07-15 LAB — CBC
HCT: 35.1 % (ref 35.0–45.0)
Hemoglobin: 11.5 g/dL — ABNORMAL LOW (ref 11.7–15.5)
MCH: 29.3 pg (ref 27.0–33.0)
MCHC: 32.8 g/dL (ref 32.0–36.0)
MCV: 89.5 fL (ref 80.0–100.0)
MPV: 10.3 fL (ref 7.5–12.5)
Platelets: 249 Thousand/uL (ref 140–400)
RBC: 3.92 Million/uL (ref 3.80–5.10)
RDW: 13.9 % (ref 11.0–15.0)
WBC: 4.5 Thousand/uL (ref 3.8–10.8)

## 2024-07-15 LAB — LIPID PANEL
Cholesterol: 185 mg/dL (ref <200)
HDL: 44 mg/dL — ABNORMAL LOW (ref >=50)
LDL Cholesterol (Calc): 121 mg/dL — ABNORMAL HIGH
Non-HDL Cholesterol (Calc): 141 mg/dL — ABNORMAL HIGH (ref <130)
Total CHOL/HDL Ratio: 4.2 (calc) (ref <5.0)
Triglycerides: 101 mg/dL (ref <150)

## 2024-07-15 LAB — TSH: TSH: 0.43 m[IU]/L

## 2024-07-17 ENCOUNTER — Ambulatory Visit: Payer: Self-pay | Admitting: Internal Medicine

## 2024-07-17 MED ORDER — ATORVASTATIN CALCIUM 20 MG PO TABS: 20.0000 mg | ORAL_TABLET | Freq: Every day | ORAL | 1 refills | Status: AC

## 2024-07-26 ENCOUNTER — Other Ambulatory Visit: Payer: Self-pay | Admitting: Internal Medicine

## 2024-07-28 NOTE — Telephone Encounter (Signed)
 Requested Prescriptions  Pending Prescriptions Disp Refills   levocetirizine (XYZAL ) 5 MG tablet [Pharmacy Med Name: LEVOCETIRIZINE 5 MG TABLET] 90 tablet 3    Sig: TAKE 1 TABLET BY MOUTH EVERY DAY IN THE EVENING     Ear, Nose, and Throat:  Antihistamines - levocetirizine dihydrochloride  Passed - 07/28/2024 11:52 AM      Passed - Cr in normal range and within 360 days    Creat  Date Value Ref Range Status  07/14/2024 0.95 0.50 - 1.03 mg/dL Final         Passed - eGFR is 10 or above and within 360 days    GFR calc Af Amer  Date Value Ref Range Status  02/25/2020 >60 >60 mL/min Final   GFR, Estimated  Date Value Ref Range Status  03/09/2024 >60 >60 mL/min Final    Comment:    (NOTE) Calculated using the CKD-EPI Creatinine Equation (2021)    GFR  Date Value Ref Range Status  12/21/2019 58.60 (L) >60.00 mL/min Final   eGFR  Date Value Ref Range Status  07/14/2024 72 > OR = 60 mL/min/1.63m2 Final         Passed - Valid encounter within last 12 months    Recent Outpatient Visits           2 weeks ago Encounter for general adult medical examination with abnormal findings   [place] [facility] [place], Angeline ORN, NP   2 months ago Attention deficit hyperactivity disorder (ADHD), predominantly hyperactive type   [place] [facility] [place], Angeline ORN, NP   2 months ago Acute migraine   [facility] [place], [place] W, NP   3 months ago Attention deficit hyperactivity disorder (ADHD), predominantly hyperactive type   [place] [facility] [place], Angeline ORN, NP   5 months ago Pure hypercholesterolemia   [place] [facility] [place], Angeline ORN, NP       Future Appointments             In 3 months Whitfield Raisin, NP [place] [facility]             levothyroxine  (SYNTHROID ) 88 MCG tablet [Pharmacy Med Name: LEVOTHYROXINE  88 MCG TABLET] 90 tablet 0    Sig:  TAKE 1 TABLET BY MOUTH EVERY DAY     Endocrinology:  Hypothyroid Agents Passed - 07/28/2024 11:52 AM      Passed - TSH in normal range and within 360 days    TSH  Date Value Ref Range Status  07/14/2024 0.43 mIU/L Final    Comment:              Reference Range .           > or = 20 Years  0.40-4.50 .                Pregnancy Ranges           First trimester    0.26-2.66           Second trimester   0.55-2.73           Third trimester    0.43-2.91          Passed - Valid encounter within last 12 months    Recent Outpatient Visits           2 weeks ago Encounter for general adult medical examination with abnormal findings   [facility] [place], [place] W, NP   2 months ago Attention deficit hyperactivity disorder (ADHD), predominantly hyperactive type   [place] [facility] [place], Angeline ORN, NP   2 months ago Acute migraine   [place] [facility] [place], [place], NP   3 months ago Attention deficit hyperactivity disorder (ADHD), predominantly hyperactive type   [facility] [place], Angeline ORN, NP   5 months ago Pure hypercholesterolemia   [place] [facility] [place], Angeline ORN, NP       Future Appointments             In 3 months Whitfield Raisin, NP [place] [facility]

## 2024-08-02 ENCOUNTER — Other Ambulatory Visit: Payer: Self-pay | Admitting: Nurse Practitioner

## 2024-08-14 ENCOUNTER — Encounter: Payer: Self-pay | Admitting: Internal Medicine

## 2024-08-15 ENCOUNTER — Encounter: Payer: Self-pay | Admitting: Internal Medicine

## 2024-08-15 ENCOUNTER — Telehealth: Admitting: Internal Medicine

## 2024-08-15 DIAGNOSIS — F411 Generalized anxiety disorder: Secondary | ICD-10-CM

## 2024-08-15 DIAGNOSIS — F331 Major depressive disorder, recurrent, moderate: Secondary | ICD-10-CM

## 2024-08-15 DIAGNOSIS — F901 Attention-deficit hyperactivity disorder, predominantly hyperactive type: Secondary | ICD-10-CM

## 2024-08-15 MED ORDER — METHYLPHENIDATE HCL ER (OSM) 45 MG PO TBCR: 1.0000 | EXTENDED_RELEASE_TABLET | Freq: Every day | ORAL | 0 refills | Status: DC

## 2024-08-15 NOTE — Patient Instructions (Signed)

## 2024-08-15 NOTE — Progress Notes (Signed)
 Virtual Visit via Video Note  I connected with [name] on 08/15/24 at  4:00 PM EST by a video enabled telemedicine application and verified that I am speaking with the correct person using two identifiers.  Location: Patient: Home Provider: Office  Person's participating in this video call: Angeline Laura, NP-C and Holli Rengel   I discussed the limitations of evaluation and management by telemedicine and the availability of in person appointments. The patient expressed understanding and agreed to proceed.  History of Present Illness:  Discussed the use of AI scribe software for clinical note transcription with the patient, who gave verbal consent to proceed.  [name] is a 53 year old female who presents with lack of motivation and increased appetite on current medication.  She was diagnosed with ADHD in June 2024 and has been on intuniv and currently on strattera . Strattera  helps her stay focused but she lacks motivation, stating 'if I'm not at work or I'm not pressed to get up to do something, I won't do it.'  She has a history of anxiety disorder and was previously considered for a diagnosis of mild manic episodes but was diagnosed with depression instead. She describes periods of high activity followed by prolonged fatigue, such as sleeping from Friday evening until Sunday. She feels her anxiety and depression are well controlled, but the lack of motivation and increased appetite are concerns.  She has a history of supraventricular tachycardia (SVT) and high blood pressure. Since starting Strattera , she has not experienced significant palpitations, only occasional mild ones.  She reports an increased appetite since starting Strattera , describing it as 'eating like an overgrown horse' and compares her appetite to that of a '[age]teen year old female teenager with the appetite of six people.'  Current medications include Strattera , venlafaxine , and alprazolam  as  needed. She is not currently seeing a therapist.       Past Medical History:  Diagnosis Date   ADHD    June 2024   Allergy     Anemia    present for many years, mild, stable   Anxiety    Breast cancer (HCC)    Breast cancer (HCC) 12/18/2020   Dysrhythmia    PAC's on flecanide (did not tolerate beta blockers)   Family history of breast cancer 01/20/2021   Family history of prostate cancer 01/20/2021   Frequent headaches    HSV infection    fever blisters   Hypertension    Hypothyroidism    Personal history of radiation therapy     Current Outpatient Medications  Medication Sig Dispense Refill   ALPRAZolam  (XANAX ) 0.25 MG tablet Take 1 tablet (0.25 mg total) by mouth 2 (two) times daily as needed. 20 tablet 0   atomoxetine  (STRATTERA ) 18 MG capsule TAKE 1 CAPSULE BY MOUTH DAILY. 90 capsule 1   atorvastatin  (LIPITOR) 20 MG tablet Take 1 tablet (20 mg total) by mouth daily. 90 tablet 1   butalbital -acetaminophen -caffeine  (FIORICET) 50-325-40 MG tablet TAKE 1 TABLET BY MOUTH EVERY 6 HOURS AS NEEDED FOR HEADACHE. 20 tablet 5   cyanocobalamin  (VITAMIN B12) 1000 MCG/ML injection INJECT 1 ML (1,000 MCG TOTAL) INTO THE MUSCLE EVERY 30 DAYS. 3 mL 69   cyclobenzaprine  (FLEXERIL ) 10 MG tablet TAKE 1 TABLET (10 MG TOTAL) BY MOUTH DAILY AS NEEDED (MIGRAINES). 15 tablet 5   Fezolinetant  (VEOZAH ) 45 MG TABS Take 1 tablet (45 mg total) by mouth daily. 30 tablet 1   fluticasone  (FLONASE ) 50 MCG/ACT nasal spray SPRAY 2 SPRAYS  INTO EACH NOSTRIL EVERY DAY     gabapentin  (NEURONTIN ) 300 MG capsule TAKE 1 CAPSULE BY MOUTH TWICE A DAY 60 capsule 1   Galcanezumab -gnlm (EMGALITY ) 120 MG/ML SOAJ Inject 120 mg into the skin every 30 (thirty) days. Please use copay card: BIN [id number] PCN [id number] GRP [id number] ID [id number] EXP 09/06/2024 1 mL 11   KLOR-CON  M10 10 MEQ tablet TAKE 1 TABLET BY MOUTH TWICE A DAY (Patient taking differently: As needed) 180 tablet 1   levocetirizine (XYZAL ) 5 MG tablet TAKE 1 TABLET BY  MOUTH EVERY DAY IN THE EVENING 90 tablet 1   levothyroxine  (SYNTHROID ) 88 MCG tablet TAKE 1 TABLET BY MOUTH EVERY DAY 90 tablet 0   losartan  (COZAAR ) 50 MG tablet TAKE 1 TABLET BY MOUTH EVERY DAY 90 tablet 0   NURTEC 75 MG TBDP Take 1 tablet (75 mg total) by mouth daily as needed (migraine headache). Max 1 tablet in 24 hours. 8 tablet 2   omeprazole  (PRILOSEC) 20 MG capsule TAKE 1 CAPSULE BY MOUTH EVERY DAY (Patient taking differently: Take by mouth daily. As needed) 90 capsule 1   ondansetron  (ZOFRAN -ODT) 8 MG disintegrating tablet Take 1 tablet (8 mg total) by mouth every 8 (eight) hours as needed. 20 tablet 11   traZODone  (DESYREL ) 50 MG tablet Take 0.5-1 tablets (25-50 mg total) by mouth at bedtime as needed for sleep. 90 tablet 1   Ubrogepant  (UBRELVY ) 100 MG TABS Take 1 tablet (100 mg total) by mouth every 2 (two) hours as needed. Maximum 200mg  a day. 6 tablet    valACYclovir  (VALTREX ) 500 MG tablet TAKE 1 TABLET (500 MG TOTAL) BY MOUTH DAILY AS NEEDED (HERPES SIMPLEX). 90 tablet 1   venlafaxine  XR (EFFEXOR  XR) 75 MG 24 hr capsule Take 1 capsule (75 mg total) by mouth daily with breakfast. 90 capsule 1   No current facility-administered medications for this visit.   Facility-Administered Medications Ordered in Other Visits  Medication Dose Route Frequency Provider Last Rate Last Admin   cyanocobalamin  (VITAMIN B12) injection 1,000 mcg  1,000 mcg Intramuscular Once Lanny Callander, MD        Allergies  Allergen Reactions   Ciprofloxacin     anxiety   Keflex [Cephalexin] Hives    Family History  Problem Relation Age of Onset   Depression Mother    Breast cancer Mother 78   Hyperlipidemia Father    Prostate cancer Father 23    Social History   Socioeconomic History   Marital status: Single    Spouse name: Not on file   Number of children: 1   Years of education: Not on file   Highest education level: Bachelor's degree (e.g., BA, AB, BS)  Occupational History   Not on file   Tobacco Use   Smoking status: Never   Smokeless tobacco: Never  Vaping Use   Vaping status: Never Used  Substance and Sexual Activity   Alcohol use: Not Currently    Comment: occasional   Drug use: No   Sexual activity: Yes    Partners: Male    Birth control/protection: I.U.D.  Other Topics Concern   Not on file  Social History Narrative   Not on file   Social Drivers of Health   Financial Resource Strain: Low Risk  (07/14/2024)   Overall Financial Resource Strain (CARDIA)    Difficulty of Paying Living Expenses: Not hard at all  Food Insecurity: No Food Insecurity (07/14/2024)   Hunger Vital Sign    Worried About  Running Out of Food in the Last Year: Never true    Ran Out of Food in the Last Year: Never true  Transportation Needs: No Transportation Needs (07/14/2024)   PRAPARE - Administrator, Civil Service (Medical): No    Lack of Transportation (Non-Medical): No  Physical Activity: Insufficiently Active (07/14/2024)   Exercise Vital Sign    Days of Exercise per Week: 2 days    Minutes of Exercise per Session: 20 min  Stress: No Stress Concern Present (07/14/2024)   [employer] of Occupational Health - Occupational Stress Questionnaire    Feeling of Stress: Only a little  Social Connections: Socially Isolated (07/14/2024)   Social Connection and Isolation Panel    Frequency of Communication with Friends and Family: Never    Frequency of Social Gatherings with Friends and Family: Never    Attends Religious Services: Never    Database Administrator or Organizations: No    Attends Engineer, Structural: Not on file    Marital Status: Never married  Intimate Partner Violence: Not At Risk (01/02/2021)   Humiliation, Afraid, Rape, and Kick questionnaire    Fear of Current or Ex-Partner: No    Emotionally Abused: No    Physically Abused: No    Sexually Abused: No     Constitutional: Patient reports intermittent headaches, difficulty losing weight.   Denies fever, malaise, fatigue, or abrupt weight changes.  Respiratory: Denies difficulty breathing, shortness of breath, cough or sputum production.   Cardiovascular: Denies chest pain, chest tightness, palpitations or swelling in the hands or feet.  Gastrointestinal: Denies abdominal pain, bloating, constipation, diarrhea or blood in the stool.  GU: Denies urgency, frequency, pain with urination, burning sensation, blood in urine, odor or discharge. Musculoskeletal: Denies decrease in range of motion, difficulty with gait, muscle pain or joint pain and swelling.  Skin: Denies redness, rashes, lesions or ulcercations.  Neurological: Patient reports impulsivity, difficulty completing tasks, lack of motivation. Denies dizziness, difficulty with memory, difficulty with speech or problems with balance and coordination.  Psych: Patient has a history of anxiety and depression.  Denies SI/HI.  No other specific complaints in a complete review of systems (except as listed in HPI above).  Observations/Objective:  Wt Readings from Last 3 Encounters:  07/14/24 226 lb 6.4 oz (102.7 kg)  05/17/24 223 lb 9.6 oz (101.4 kg)  05/04/24 225 lb 6.4 oz (102.2 kg)    General: Appears her stated age, obese, in NAD. Pulmonary/Chest: Normal effort. No respiratory distress.  Neurological: Alert and oriented. Coordination normal.  Psychiatric: Mood and affect normal. Behavior is normal. Judgment and thought content normal.     BMET    Component Value Date/Time   NA 139 07/14/2024 0955   NA 143 01/26/2020 1600   K 4.2 07/14/2024 0955   CL 103 07/14/2024 0955   CO2 26 07/14/2024 0955   GLUCOSE 115 (H) 07/14/2024 0955   BUN 11 07/14/2024 0955   BUN 11 01/26/2020 1600   CREATININE 0.95 07/14/2024 0955   CALCIUM  9.1 07/14/2024 0955   GFRNONAA >60 03/09/2024 1120   GFRAA >60 02/25/2020 2231    Lipid Panel     Component Value Date/Time   CHOL 185 07/14/2024 0955   TRIG 101 07/14/2024 0955   HDL 44  (L) 07/14/2024 0955   CHOLHDL 4.2 07/14/2024 0955   VLDL 18.8 12/21/2019 1204   LDLCALC 121 (H) 07/14/2024 0955    CBC    Component Value Date/Time  WBC 4.5 07/14/2024 0955   RBC 3.92 07/14/2024 0955   HGB 11.5 (L) 07/14/2024 0955   HGB 12.1 03/09/2024 1120   HGB 10.1 (L) 04/25/2020 1154   HCT 35.1 07/14/2024 0955   HCT 31.6 (L) 04/25/2020 1154   PLT 249 07/14/2024 0955   PLT 233 03/09/2024 1120   PLT 261 04/25/2020 1154   MCV 89.5 07/14/2024 0955   MCV 80 04/25/2020 1154   MCH 29.3 07/14/2024 0955   MCHC 32.8 07/14/2024 0955   RDW 13.9 07/14/2024 0955   RDW 16.8 (H) 04/25/2020 1154   LYMPHSABS 1.2 03/09/2024 1120   LYMPHSABS 1.4 04/25/2020 1154   MONOABS 0.2 03/09/2024 1120   EOSABS 0.1 03/09/2024 1120   EOSABS 0.0 04/25/2020 1154   BASOSABS 0.1 03/09/2024 1120   BASOSABS 0.1 04/25/2020 1154    Hgb A1C Lab Results  Component Value Date   HGBA1C 5.7 (H) 07/14/2024       Assessment and Plan:  Assessment and Plan    Attention-deficit hyperactivity disorder, predominantly hyperactive type Strattera  effective for focus but not for energy or motivation. Concerns about stimulant use due to SVT and hypertension. Considering Relexxii  for energy and motivation with caution for SVT. - Prescribed Relexxii  45 mg daily, pending insurance approval. - Instructed to discontinue Strattera  upon starting Relexxii . - Advised to monitor for palpitations, chest pain, or tachycardia and discontinue Relexxii  if these occur. - Discussed potential for weekday only use of Relexxii  if needed.  Major depressive disorder, recurrent, moderate Motivation issues persist despite well-controlled anxiety and depression with venlafaxine  and alprazolam . - Continue venlafaxine  and alprazolam  as needed.  Generalized anxiety disorder Anxiety well-controlled with current medication regimen.      RTC in 5 months for follow-up of chronic conditions  Follow Up Instructions:    I discussed the  assessment and treatment plan with the patient. The patient was provided an opportunity to ask questions and all were answered. The patient agreed with the plan and demonstrated an understanding of the instructions.   The patient was advised to call back or seek an in-person evaluation if the symptoms worsen or if the condition fails to improve as anticipated.   Angeline Laura, NP

## 2024-08-18 ENCOUNTER — Other Ambulatory Visit (HOSPITAL_COMMUNITY): Payer: Self-pay

## 2024-08-18 ENCOUNTER — Encounter: Payer: Self-pay | Admitting: Internal Medicine

## 2024-08-18 ENCOUNTER — Other Ambulatory Visit: Payer: Self-pay | Admitting: Internal Medicine

## 2024-08-18 ENCOUNTER — Telehealth: Payer: Self-pay | Admitting: Pharmacy Technician

## 2024-08-18 MED ORDER — METHYLPHENIDATE HCL ER 36 MG PO TB24: 36.0000 mg | ORAL_TABLET | Freq: Every day | ORAL | 0 refills | Status: DC

## 2024-08-18 NOTE — Telephone Encounter (Signed)
 PA request has been Started. New Encounter has been or will be created for follow up. For additional info see Pharmacy Prior Auth telephone encounter from 08/18/2024.

## 2024-08-18 NOTE — Telephone Encounter (Signed)
 Pharmacy Patient Advocate Encounter   Received notification from Patient Advice Request messages that prior authorization for Relexxii  45MG  er tablets is required/requested.   Insurance verification completed.   The patient is insured through [facility].   Per test claim: PA required; PA started via CoverMyMeds. KEY [id number] . Please see clinical question(s) below that I am not finding the answer to in their chart and advise.

## 2024-08-18 NOTE — Telephone Encounter (Signed)
 Pharmacy Patient Advocate Encounter   Received notification from Onbase that prior authorization for Methylphenidate  HCl ER TB24 36MG  tablets is required/requested.   Insurance verification completed.   The patient is insured through [facility].   Per test claim: PA required; PA submitted to above mentioned insurance via Latent Key/confirmation #/EOC [id number] Status is pending

## 2024-08-19 ENCOUNTER — Other Ambulatory Visit: Payer: Self-pay | Admitting: Internal Medicine

## 2024-08-21 ENCOUNTER — Other Ambulatory Visit (HOSPITAL_COMMUNITY): Payer: Self-pay

## 2024-08-21 NOTE — Telephone Encounter (Signed)
 Pharmacy Patient Advocate Encounter  Received notification from [facility] that Prior Authorization for Methylphenidate  HCl ER TB24 36MG  tablets has been APPROVED from 08/18/2024 to 08/18/2024. Ran test claim, Copay is $5.00. This test claim was processed through [facility]- copay amounts may vary at other pharmacies due to pharmacy/plan contracts, or as the patient moves through the different stages of their insurance plan.   PA #/Case ID/Reference #: [id number]  Patient may have difficulty accessing this medication. I have been advised by [facility] that it is on backorder.

## 2024-08-21 NOTE — Telephone Encounter (Signed)
 Resubmitted PA for Methylphenidate  HCl ER TBCR 36MG  tablets.

## 2024-08-22 ENCOUNTER — Other Ambulatory Visit (HOSPITAL_COMMUNITY): Payer: Self-pay

## 2024-08-24 ENCOUNTER — Other Ambulatory Visit (HOSPITAL_COMMUNITY): Payer: Self-pay

## 2024-08-24 ENCOUNTER — Other Ambulatory Visit: Payer: Self-pay | Admitting: Internal Medicine

## 2024-08-26 NOTE — Telephone Encounter (Signed)
 Requested medication (s) are due for refill today: Yes  Requested medication (s) are on the active medication list: Yes  Last refill:  12/10/23  Future visit scheduled: Yes  Notes to clinic: Unable to refill per protocol, cannot delegate.      Requested Prescriptions  Pending Prescriptions Disp Refills   cyclobenzaprine  (FLEXERIL ) 10 MG tablet [Pharmacy Med Name: CYCLOBENZAPRINE  10 MG TABLET] 15 tablet 5    Sig: TAKE 1 TABLET (10 MG TOTAL) BY MOUTH DAILY AS NEEDED (MIGRAINES).     Not Delegated - Analgesics:  Muscle Relaxants Failed - 08/26/2024  8:30 AM      Failed - This refill cannot be delegated      Passed - Valid encounter within last 6 months    Recent Outpatient Visits           1 week ago Attention deficit hyperactivity disorder (ADHD), predominantly hyperactive type   [facility] [place], Angeline ORN, NP   1 month ago Encounter for general adult medical examination with abnormal findings   [facility] [place], Angeline ORN, NP   3 months ago Attention deficit hyperactivity disorder (ADHD), predominantly hyperactive type   [facility] [place], Angeline ORN, NP   3 months ago Acute migraine    [facility] [place], [place], NP   4 months ago Attention deficit hyperactivity disorder (ADHD), predominantly hyperactive type   [facility] [place], Angeline ORN, NP       Future Appointments             In 2 months Whitfield Raisin, NP [facility]

## 2024-08-27 ENCOUNTER — Encounter: Payer: Self-pay | Admitting: Internal Medicine

## 2024-08-28 MED ORDER — ALPRAZOLAM 0.25 MG PO TABS: 0.2500 mg | ORAL_TABLET | Freq: Two times a day (BID) | ORAL | 0 refills | Status: AC | PRN

## 2024-09-15 ENCOUNTER — Inpatient Hospital Stay: Admitting: Nurse Practitioner

## 2024-09-15 ENCOUNTER — Inpatient Hospital Stay

## 2024-09-16 ENCOUNTER — Encounter: Payer: Self-pay | Admitting: Internal Medicine

## 2024-09-18 ENCOUNTER — Other Ambulatory Visit: Payer: Self-pay | Admitting: Hematology

## 2024-09-18 DIAGNOSIS — Z9889 Other specified postprocedural states: Secondary | ICD-10-CM

## 2024-09-18 MED ORDER — METHYLPHENIDATE HCL ER 36 MG PO TB24: 36.0000 mg | ORAL_TABLET | Freq: Every day | ORAL | 0 refills | Status: AC

## 2024-09-20 ENCOUNTER — Encounter: Payer: Self-pay | Admitting: Internal Medicine

## 2024-09-21 ENCOUNTER — Encounter: Payer: Self-pay | Admitting: Hematology

## 2024-09-21 ENCOUNTER — Other Ambulatory Visit (HOSPITAL_COMMUNITY): Payer: Self-pay

## 2024-09-21 ENCOUNTER — Telehealth: Payer: Self-pay

## 2024-09-21 NOTE — Telephone Encounter (Signed)
 Pharmacy Patient Advocate Encounter   Received notification from [facility] KEY that prior authorization for Concerta  36 is required/requested.   Insurance verification completed.   The patient is insured through [facility].   Per test claim: PA required and submitted KEY/EOC/Request #: by phone [id number] from 09/21/24 to 09/22/27.  PA ref # [id number]

## 2024-10-03 ENCOUNTER — Other Ambulatory Visit: Payer: Self-pay | Admitting: Nurse Practitioner

## 2024-10-03 DIAGNOSIS — Z853 Personal history of malignant neoplasm of breast: Secondary | ICD-10-CM

## 2024-10-03 NOTE — Progress Notes (Unsigned)
 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 Biopsy of the left breast mass on 10/31/20 was again benign but felt to be discordant. -left lumpectomy under Dr. Vanderbilt on 12/18/20 showed invasive lobular carcinoma, 1.1 cm, grade 2, negative resection margins. ER+ (weakly), PR+, Her2-. RS 16 with less than 1% benefit of chemotherapy.  -Her 01/07/21 Sentinel LN biopsy was negative.  -Her genetic testing from 01/16/21 was negative for pathogenetic mutations.  -S/p adjuvant radiation with Dr. Dewey on 02/05/21 - 03/19/21.  -Tamoxifen  prescribed in April 2023 but did not start; [facility] 10/05/22 confirmed menopause and she switched to Anastrozole . However, due to joint pain and hot flashes, she only took it for 3 weeks and did not notify us  -Ms. Landeck is clinically doing well.  Exam is benign, recent mammogram is negative.  Overall no clinical concern for recurrence -Labs reviewed, mild anemia and hypokalemia.  It was recommended she restart potassium supplement once daily.  She now takes this on as needed basis.  -We had a lengthy discussion about antiestrogen therapy.  Her mother took tamoxifen  and was very sick from it.  Patient did not tolerate anastrozole  due to joint pain.  It was recommend she try exemestane , reviewed  potential risk, side effect, and benefit.  She is willing to try.  Took exemestane  for just a few days.  Stopped after developing fatigue and joint pain.  Does not want to try alternative at this time.  Understands that AI's to help with breast cancer prevention in the future. -She is scheduled for 3D diagnostic mammogram in April 2026. -Will order MR bilateral breast with and without contrast for October 2026. -Plan for labs and follow-up in 6 months, sooner if needed.   [facility] DEXA scan done on 12/10/2022.  Results were considered to be normal.  She does take calcium  and vitamin D  every day.  Nerve pain Patient does have intermittent nerve pain in left axillary area.  This radiates to outer lower quadrant of the left breast.  Has noticed some cording in this area.  Takes gabapentin  300 mg on as needed basis.  Does not take on days when she does not need this.  It is prescribed twice daily.  Recommended she take this every evening prior to bedtime and continue daytime dosing on as needed basis.  If pain is consistent with increased frequency of gabapentin , will consider increasing dose further.  Left breast cancer, ER + Patient with history of invasive lobular carcinoma.  Was found on surgical sample from lumpectomy and not on screening or diagnostic images.  She is not taking antiestrogen therapy due to negative side effects from tamoxifen , anastrozole , and exemestane . Patient missed, or was not scheduled, for bilateral MRI in October 2025.  She is scheduled for 3D diagnostic mammogram in April 2026.  Will order MR for bilateral breasts in October 2026 and follow-up  in 6 months.  Plan Labs reviewed. -Very mild elevation of ANC at 7.9 with remainder of CBC unremarkable. - CMP unremarkable. Increase consistency of taking gabapentin  to every evening.  Continue use of daytime gabapentin  as needed for increased pain in axillary and surgical scar region. 3D diagnostic mammogram scheduled for April  2026. Will order MRI of bilateral breasts with and without contrast for October 2026 Plan for labs and follow-up in 6 months, sooner if needed.   The patient understands the plans discussed today and is in agreement with them.  She knows to contact our office if she develops concerns prior to her next appointment.  I provided 25 minutes of face-to-face time during this encounter and > 50% was spent counseling as documented under my assessment and plan.    Powell FORBES Lessen, NP  [facility] - A DEPT OF [facility] [address] [place] [address] Dept: [phone] Dept Fax: [phone]   No orders of the defined types were placed in this encounter.     CHIEF COMPLAINT:  CC: Left breast cancer, ER +  Current Treatment: Surveillance  INTERVAL HISTORY:  [name] is here today for repeat clinical assessment.  She was last seen by Dr. Lanny on 03/09/2024.  She is scheduled for 3D diagnostic mammogram on 12/27/2024.  She does have cording or nerve type pain along the left axillary region and along the outer aspect of the left breast.  Has not noted new masses or lumps.  No other changes in either breast are noted.  She denies chest pain, chest pressure, or shortness of breath. She denies headaches or visual disturbances. She denies abdominal pain, nausea, vomiting, or changes in bowel or bladder habits.  She denies fevers or chills. She denies pain. Her appetite is good. Her weight has been stable.  I have reviewed the past medical history, past surgical history, social history and family history with the patient and they are unchanged from previous note.  ALLERGIES:  is allergic to ciprofloxacin and keflex [cephalexin].  MEDICATIONS:  Current Outpatient Medications  Medication Sig Dispense Refill   ALPRAZolam  (XANAX ) 0.25 MG tablet Take 1 tablet (0.25 mg total) by mouth 2 (two) times daily as needed. 20 tablet 0    atomoxetine  (STRATTERA ) 18 MG capsule TAKE 1 CAPSULE BY MOUTH DAILY. 90 capsule 1   atorvastatin  (LIPITOR) 20 MG tablet Take 1 tablet (20 mg total) by mouth daily. 90 tablet 1   butalbital -acetaminophen -caffeine  (FIORICET) 50-325-40 MG tablet TAKE 1 TABLET BY MOUTH EVERY 6 HOURS AS NEEDED FOR HEADACHE. 20 tablet 5   cyanocobalamin  (VITAMIN B12) 1000 MCG/ML injection INJECT 1 ML (1,000 MCG TOTAL) INTO THE MUSCLE EVERY 30 DAYS. 3 mL 69   cyclobenzaprine  (FLEXERIL ) 10 MG tablet TAKE 1 TABLET (10 MG TOTAL) BY MOUTH DAILY AS NEEDED (MIGRAINES). 15 tablet 5   Fezolinetant  (VEOZAH ) 45 MG TABS Take 1 tablet (45 mg total) by mouth daily. 30 tablet 1   fluticasone  (FLONASE ) 50 MCG/ACT nasal spray SPRAY 2 SPRAYS INTO EACH NOSTRIL EVERY DAY     gabapentin  (NEURONTIN ) 300 MG capsule TAKE 1 CAPSULE BY MOUTH TWICE A DAY 60 capsule 1   Galcanezumab -gnlm (EMGALITY ) 120 MG/ML SOAJ Inject 120 mg into the skin every 30 (thirty) days. Please use copay card: BIN [id number] PCN [id number] GRP [id number] ID [id number] EXP 09/06/2024 1 mL 11   KLOR-CON  M10 10 MEQ tablet TAKE 1 TABLET BY MOUTH TWICE A  DAY (Patient taking differently: As needed) 180 tablet 1   levocetirizine (XYZAL ) 5 MG tablet TAKE 1 TABLET BY MOUTH EVERY DAY IN THE EVENING 90 tablet 1   levothyroxine  (SYNTHROID ) 88 MCG tablet TAKE 1 TABLET BY MOUTH EVERY DAY 90 tablet 0   losartan  (COZAAR ) 50 MG tablet TAKE 1 TABLET BY MOUTH EVERY DAY 90 tablet 0   methylphenidate  36 MG PO CR tablet Take 1 tablet (36 mg total) by mouth daily. 30 tablet 0   NURTEC 75 MG TBDP Take 1 tablet (75 mg total) by mouth daily as needed (migraine headache). Max 1 tablet in 24 hours. 8 tablet 2   omeprazole  (PRILOSEC) 20 MG capsule TAKE 1 CAPSULE BY MOUTH EVERY DAY (Patient taking differently: Take by mouth daily. As needed) 90 capsule 1   ondansetron  (ZOFRAN -ODT) 8 MG disintegrating tablet Take 1 tablet (8 mg total) by mouth every 8 (eight) hours as needed. 20 tablet 11   traZODone  (DESYREL ) 50  MG tablet Take 0.5-1 tablets (25-50 mg total) by mouth at bedtime as needed for sleep. 90 tablet 1   Ubrogepant  (UBRELVY ) 100 MG TABS Take 1 tablet (100 mg total) by mouth every 2 (two) hours as needed. Maximum 200mg  a day. 6 tablet    valACYclovir  (VALTREX ) 500 MG tablet TAKE 1 TABLET (500 MG TOTAL) BY MOUTH DAILY AS NEEDED (HERPES SIMPLEX). 90 tablet 1   venlafaxine  XR (EFFEXOR  XR) 75 MG 24 hr capsule Take 1 capsule (75 mg total) by mouth daily with breakfast. 90 capsule 1   No current facility-administered medications for this visit.   Facility-Administered Medications Ordered in Other Visits  Medication Dose Route Frequency Provider Last Rate Last Admin   cyanocobalamin  (VITAMIN B12) injection 1,000 mcg  1,000 mcg Intramuscular Once Lanny Callander, MD        HISTORY OF PRESENT ILLNESS:   Oncology History Overview Note  Cancer Staging Carcinoma of upper-outer quadrant of left breast, estrogen receptor positive (HCC) Staging form: Breast, AJCC 8th Edition - Pathologic stage from 01/02/2021: No Stage Recommended (pT1c, cN0, cM0, G2, ER+, PR+, HER2-) - Unsigned    History of left breast cancer  10/28/2020 Imaging   DIGITAL DIAGNOSTIC UNILATERAL LEFT MAMMOGRAM WITH TOMOSYNTHESIS AND CAD; ULTRASOUND LEFT BREAST LIMITED  CLINICAL DATA: 54 year old female presenting for short-term follow-up of a left breast biopsy demonstrating fibrocystic changes.  IMPRESSION: Indeterminate left breast mass at 2 o'clock 7 cm from the nipple. Targeted ultrasound of the left axilla demonstrates normal lymph nodes.   10/31/2020 Pathology Results   Breast, left, needle core biopsy, 2 o'clock - FIBROADIPOSE TISSUE WITH MINIMAL BREAST PARENCHYMA DEMONSTRATING MILD FIBROCYSTIC CHANGE TO INCLUDE APOCRINE METAPLASIA.  This was found to be discordant.   12/18/2020 Surgery   A. BREAST, LEFT, LUMPECTOMY:  - Invasive lobular carcinoma, 1.1 cm, grade 2.  See comment  - Resection margins are negative for carcinoma;  inferior margin is  focally less than 1 mm from carcinoma and medial margin is approximately  1 mm from carcinoma  - Biopsy related changes  - Background fibrocystic change   PROGNOSTIC INDICATOR RESULTS:  - The tumor cells are NEGATIVE for Her2 (0).  - Estrogen Receptor:       POSITIVE, 30%, WEAK STAINING  - Progesterone Receptor:   POSITIVE, 80%, MODERATE STAINING  - Proliferation Marker Ki-67:   <5%    12/18/2020 Oncotype testing   Oncotype  Recurrence Score 16  Distant recurrence risk at 9 years of 4% with AI or Tamoxifen  alone.  Less than 1% benefit of chemotherapy.    01/02/2021 Initial Diagnosis   Carcinoma of upper-outer quadrant of left breast, estrogen receptor positive (HCC)   01/02/2021 Cancer Staging   Staging form: Breast, AJCC 8th Edition - Pathologic stage from 01/02/2021: Stage IA (pT1c, pN0, cM0, G2, ER+, PR+, HER2-) - Signed by Burton, Lacie K, NP on 05/12/2021 Method of lymph node assessment: Clinical Multigene prognostic tests performed: [name] Histologic grading system: 3 grade system Residual tumor (R): R0 - [name]   01/07/2021 Surgery   LEFT SENTINEL NODE BIOPSY by Dr Vanderbilt  FINAL MICROSCOPIC DIAGNOSIS:   A. LYMPH NODE, LEFT, SENTINEL, EXCISION:  - One lymph node negative for metastatic carcinoma (0/1).   B. LYMPH NODE, LEFT, SENTINEL, EXCISION:  - One lymph node negative for metastatic carcinoma (0/1).   COMMENT:  Immunohistochemistry for cytokeratin AE1/AE3 is performed on parts A and  B and no metastatic carcinoma is identified   02/05/2021 - 03/19/2021 Radiation Therapy   Adjuvant Radiation with Dr Dewey    05/13/2021 -  Anti-estrogen oral therapy   Tamoxifen  20mg  once daily starting in September 2022   05/13/2021 Survivorship   SCP delivered by Lacie Burton, NP   06/25/2023 Imaging   MR bilateral breasts with and without contrast  IMPRESSION: Stable lumpectomy changes in the left breast. No MRI evidence of malignancy in either breast.   RECOMMENDATION: 1.  Recommend annual mammography, due April 2025.  2. The American Cancer Society recommends annual MRI and mammography in patients with an estimated lifetime risk of developing breast cancer greater than 20 - 25%, or who are known or suspected to be positive for the breast cancer gene.  BI-RADS CATEGORY  2: Benign.         REVIEW OF SYSTEMS:   Constitutional: Denies fevers, chills or abnormal weight loss Eyes: Denies blurriness of vision Ears, nose, mouth, throat, and face: Denies mucositis or sore throat Respiratory: Denies cough, dyspnea or wheezes Cardiovascular: Denies palpitation, chest discomfort or lower extremity swelling Gastrointestinal:  Denies nausea, heartburn or change in bowel habits Skin: Denies abnormal skin rashes Lymphatics: Denies new lymphadenopathy or easy bruising Neurological:Denies numbness, tingling or new weaknesses Behavioral/Psych: Mood is stable, no new changes  All other systems were reviewed with the patient and are negative.   VITALS:   Today's Vitals   10/05/24 1510 10/05/24 1511  BP: (!) 153/93 (!) 149/94  Pulse: 97   Resp: 17   Temp: 97.7 F (36.5 C)   SpO2: 98%   Weight: 225 lb 6.4 oz (102.2 kg)   PainSc:  3    Body mass index is 36.94 kg/m.   Wt Readings from Last 3 Encounters:  10/05/24 225 lb 6.4 oz (102.2 kg)  07/14/24 226 lb 6.4 oz (102.7 kg)  05/17/24 223 lb 9.6 oz (101.4 kg)    Body mass index is 36.94 kg/m.  Performance status (ECOG): 1 - Symptomatic but completely ambulatory  PHYSICAL EXAM:   GENERAL:alert, no distress and comfortable SKIN: skin color, texture, turgor are normal, no rashes or significant lesions EYES: normal, Conjunctiva are pink and non-injected, sclera clear OROPHARYNX:no exudate, no erythema and lips, buccal mucosa, and tongue normal  NECK: supple, thyroid  normal size, non-tender, without nodularity LYMPH:  no palpable lymphadenopathy in the cervical, axillary or  inguinal LUNGS: clear to auscultation and percussion with normal breathing effort HEART: regular rate & rhythm and no murmurs and no lower extremity edema ABDOMEN:abdomen soft, non-tender and normal bowel sounds Musculoskeletal:no cyanosis of digits and  no clubbing  NEURO: alert & oriented x 3 with fluent speech, no focal motor/sensory deficits BREAST: There are no palpable masses or noted in the right breast.  There is no nipple inversion or discharge.  There is no axillary lymphadenopathy on the right.  There are no palpable lumps or masses in the left breast.  There are mild fibrous changes noted in right axillary region and along the outer lower quadrant of the right breast.  Mild tenderness present with palpation.  There is no nipple inversion or nipple discharge.  There is no axillary lymphadenopathy on the right.  LABORATORY DATA:  I have reviewed the data as listed    Component Value Date/Time   NA 140 10/05/2024 1402   NA 143 01/26/2020 1600   K 4.0 10/05/2024 1402   CL 103 10/05/2024 1402   CO2 23 10/05/2024 1402   GLUCOSE 135 (H) 10/05/2024 1402   BUN 10 10/05/2024 1402   BUN 11 01/26/2020 1600   CREATININE 0.99 10/05/2024 1402   CREATININE 0.95 07/14/2024 0955   CALCIUM  9.7 10/05/2024 1402   PROT 7.7 10/05/2024 1402   ALBUMIN 4.5 10/05/2024 1402   AST 20 10/05/2024 1402   ALT 8 10/05/2024 1402   ALKPHOS 148 (H) 10/05/2024 1402   BILITOT 0.3 10/05/2024 1402   GFRNONAA >60 10/05/2024 1402   GFRAA >60 02/25/2020 2231     Lab Results  Component Value Date   WBC 8.5 10/05/2024   NEUTROABS 7.9 (H) 10/05/2024   HGB 12.1 10/05/2024   HCT 35.5 (L) 10/05/2024   MCV 85.3 10/05/2024   PLT 251 10/05/2024     "

## 2024-10-03 NOTE — Assessment & Plan Note (Signed)
 estrogen receptor positive (HCC) Stage IA, (pT1c, pN0), ER+/PR+/HER2-, Grade 2, RS 16  -She was under short-term follow up after a benign left breast biopsy. Biopsy of the left breast mass on 10/31/20 was again benign but felt to be discordant. -left lumpectomy under Dr. Vanderbilt on 12/18/20 showed invasive lobular carcinoma, 1.1 cm, grade 2, negative resection margins. ER+ (weakly), PR+, Her2-. RS 16 with less than 1% benefit of chemotherapy.  -Her 01/07/21 Sentinel LN biopsy was negative.  -Her genetic testing from 01/16/21 was negative for pathogenetic mutations.  -S/p adjuvant radiation with Dr. Dewey on 02/05/21 - 03/19/21.  -Tamoxifen  prescribed in April 2023 but did not start; [facility] 10/05/22 confirmed menopause and she switched to Anastrozole . However, due to joint pain and hot flashes, she only took it for 3 weeks and did not notify us  -Ms. Harmes is clinically doing well.  Exam is benign, recent mammogram is negative.  Overall no clinical concern for recurrence -Labs reviewed, mild anemia and hypokalemia.  It was recommended she restart potassium supplement once daily.  She now takes this on as needed basis.  -We had a lengthy discussion about antiestrogen therapy.  Her mother took tamoxifen  and was very sick from it.  Patient did not tolerate anastrozole  due to joint pain.  It was recommend she try exemestane , reviewed potential risk, side effect, and benefit.  She is willing to try.  Took exemestane  for just a few days.  Stopped after developing fatigue and joint pain.  Does not want to try alternative at this time.  Understands that AI's to help with breast cancer prevention in the future. -She is scheduled for 3D diagnostic mammogram in April 2026. -Will order MR bilateral breast with and without contrast for October 2026. -Plan for labs and follow-up in 6 months, sooner if needed.

## 2024-10-04 ENCOUNTER — Other Ambulatory Visit: Payer: Self-pay | Admitting: Nurse Practitioner

## 2024-10-05 ENCOUNTER — Inpatient Hospital Stay: Attending: Hematology

## 2024-10-05 ENCOUNTER — Encounter: Payer: Self-pay | Admitting: Nurse Practitioner

## 2024-10-05 ENCOUNTER — Inpatient Hospital Stay: Admitting: Nurse Practitioner

## 2024-10-05 VITALS — BP 149/94 | HR 97 | Temp 97.7°F | Resp 17 | Wt 225.4 lb

## 2024-10-05 DIAGNOSIS — Z853 Personal history of malignant neoplasm of breast: Secondary | ICD-10-CM

## 2024-10-05 LAB — CBC WITH DIFFERENTIAL (CANCER CENTER ONLY)
Abs Immature Granulocytes: 0.03 10*3/uL (ref 0.00–0.07)
Basophils Absolute: 0 10*3/uL (ref 0.0–0.1)
Basophils Relative: 1 %
Eosinophils Absolute: 0 10*3/uL (ref 0.0–0.5)
Eosinophils Relative: 0 %
HCT: 35.5 % — ABNORMAL LOW (ref 36.0–46.0)
Hemoglobin: 12.1 g/dL (ref 12.0–15.0)
Immature Granulocytes: 0 %
Lymphocytes Relative: 5 %
Lymphs Abs: 0.5 10*3/uL — ABNORMAL LOW (ref 0.7–4.0)
MCH: 29.1 pg (ref 26.0–34.0)
MCHC: 34.1 g/dL (ref 30.0–36.0)
MCV: 85.3 fL (ref 80.0–100.0)
Monocytes Absolute: 0.1 10*3/uL (ref 0.1–1.0)
Monocytes Relative: 1 %
Neutro Abs: 7.9 10*3/uL — ABNORMAL HIGH (ref 1.7–7.7)
Neutrophils Relative %: 93 %
Platelet Count: 251 10*3/uL (ref 150–400)
RBC: 4.16 MIL/uL (ref 3.87–5.11)
RDW: 13.9 % (ref 11.5–15.5)
WBC Count: 8.5 10*3/uL (ref 4.0–10.5)
nRBC: 0 % (ref 0.0–0.2)

## 2024-10-05 LAB — CMP (CANCER CENTER ONLY)
ALT: 8 U/L (ref 0–44)
AST: 20 U/L (ref 15–41)
Albumin: 4.5 g/dL (ref 3.5–5.0)
Alkaline Phosphatase: 148 U/L — ABNORMAL HIGH (ref 38–126)
Anion gap: 14 (ref 5–15)
BUN: 10 mg/dL (ref 6–20)
CO2: 23 mmol/L (ref 22–32)
Calcium: 9.7 mg/dL (ref 8.9–10.3)
Chloride: 103 mmol/L (ref 98–111)
Creatinine: 0.99 mg/dL (ref 0.44–1.00)
GFR, Estimated: >60 mL/min
Glucose, Bld: 135 mg/dL — ABNORMAL HIGH (ref 70–99)
Potassium: 4 mmol/L (ref 3.5–5.1)
Sodium: 140 mmol/L (ref 135–145)
Total Bilirubin: 0.3 mg/dL (ref 0.0–1.2)
Total Protein: 7.7 g/dL (ref 6.5–8.1)

## 2024-10-06 ENCOUNTER — Encounter: Payer: Self-pay | Admitting: Internal Medicine

## 2024-11-23 ENCOUNTER — Telehealth: Admitting: Adult Health

## 2024-12-27 ENCOUNTER — Encounter

## 2025-01-19 ENCOUNTER — Ambulatory Visit: Admitting: Internal Medicine

## 2025-02-26 ENCOUNTER — Inpatient Hospital Stay: Admitting: Nurse Practitioner

## 2025-02-26 ENCOUNTER — Inpatient Hospital Stay
